# Patient Record
Sex: Male | Born: 1940 | Race: White | Hispanic: No | Marital: Married | Smoking: Former smoker
Health system: Southern US, Community
[De-identification: ages and names within clinical notes are randomized; demographics above are authoritative.]

## PROBLEM LIST (undated history)

## (undated) DIAGNOSIS — I679 Cerebrovascular disease, unspecified: Secondary | ICD-10-CM

## (undated) DIAGNOSIS — M545 Low back pain, unspecified: Secondary | ICD-10-CM

## (undated) DIAGNOSIS — C801 Malignant (primary) neoplasm, unspecified: Secondary | ICD-10-CM

## (undated) DIAGNOSIS — I10 Essential (primary) hypertension: Secondary | ICD-10-CM

## (undated) DIAGNOSIS — R05 Cough: Secondary | ICD-10-CM

## (undated) DIAGNOSIS — E785 Hyperlipidemia, unspecified: Secondary | ICD-10-CM

## (undated) DIAGNOSIS — J449 Chronic obstructive pulmonary disease, unspecified: Secondary | ICD-10-CM

## (undated) DIAGNOSIS — I639 Cerebral infarction, unspecified: Secondary | ICD-10-CM

## (undated) DIAGNOSIS — R058 Other specified cough: Secondary | ICD-10-CM

## (undated) DIAGNOSIS — R269 Unspecified abnormalities of gait and mobility: Principal | ICD-10-CM

## (undated) HISTORY — PX: OTHER SURGICAL HISTORY: SHX169

## (undated) HISTORY — DX: Low back pain: M54.5

## (undated) HISTORY — DX: Low back pain, unspecified: M54.50

## (undated) HISTORY — DX: Unspecified abnormalities of gait and mobility: R26.9

## (undated) HISTORY — DX: Hyperlipidemia, unspecified: E78.5

## (undated) HISTORY — DX: Cerebrovascular disease, unspecified: I67.9

---

## 2005-11-12 DIAGNOSIS — I639 Cerebral infarction, unspecified: Secondary | ICD-10-CM

## 2005-11-12 HISTORY — DX: Cerebral infarction, unspecified: I63.9

## 2006-07-24 ENCOUNTER — Encounter: Admission: RE | Admit: 2006-07-24 | Discharge: 2006-07-24 | Payer: Self-pay | Admitting: Otolaryngology

## 2006-08-12 ENCOUNTER — Encounter: Admission: RE | Admit: 2006-08-12 | Discharge: 2006-08-12 | Payer: Self-pay | Admitting: Internal Medicine

## 2006-09-11 ENCOUNTER — Ambulatory Visit (HOSPITAL_COMMUNITY): Admission: RE | Admit: 2006-09-11 | Discharge: 2006-09-11 | Payer: Self-pay | Admitting: Internal Medicine

## 2006-10-21 ENCOUNTER — Inpatient Hospital Stay (HOSPITAL_COMMUNITY): Admission: RE | Admit: 2006-10-21 | Discharge: 2006-10-23 | Payer: Self-pay | Admitting: *Deleted

## 2006-10-21 ENCOUNTER — Encounter (INDEPENDENT_AMBULATORY_CARE_PROVIDER_SITE_OTHER): Payer: Self-pay | Admitting: Specialist

## 2007-04-24 ENCOUNTER — Ambulatory Visit: Payer: Self-pay | Admitting: *Deleted

## 2007-08-15 ENCOUNTER — Emergency Department (HOSPITAL_COMMUNITY): Admission: EM | Admit: 2007-08-15 | Discharge: 2007-08-15 | Payer: Self-pay | Admitting: Emergency Medicine

## 2007-11-13 HISTORY — PX: CAROTID ARTERY ANGIOPLASTY: SHX1300

## 2007-11-20 ENCOUNTER — Ambulatory Visit: Payer: Self-pay | Admitting: *Deleted

## 2008-11-12 HISTORY — PX: BACK SURGERY: SHX140

## 2008-11-25 ENCOUNTER — Ambulatory Visit: Payer: Self-pay | Admitting: *Deleted

## 2009-05-19 ENCOUNTER — Inpatient Hospital Stay (HOSPITAL_COMMUNITY): Admission: RE | Admit: 2009-05-19 | Discharge: 2009-05-21 | Payer: Self-pay | Admitting: Orthopedic Surgery

## 2009-05-23 ENCOUNTER — Ambulatory Visit: Payer: Self-pay | Admitting: *Deleted

## 2009-05-23 ENCOUNTER — Ambulatory Visit: Admission: RE | Admit: 2009-05-23 | Discharge: 2009-05-23 | Payer: Self-pay | Admitting: Orthopedic Surgery

## 2009-05-23 ENCOUNTER — Encounter (INDEPENDENT_AMBULATORY_CARE_PROVIDER_SITE_OTHER): Payer: Self-pay | Admitting: Orthopedic Surgery

## 2009-10-10 ENCOUNTER — Ambulatory Visit (HOSPITAL_BASED_OUTPATIENT_CLINIC_OR_DEPARTMENT_OTHER): Admission: RE | Admit: 2009-10-10 | Discharge: 2009-10-11 | Payer: Self-pay | Admitting: Urology

## 2009-10-14 ENCOUNTER — Encounter (INDEPENDENT_AMBULATORY_CARE_PROVIDER_SITE_OTHER): Payer: Self-pay | Admitting: *Deleted

## 2009-10-14 ENCOUNTER — Ambulatory Visit: Payer: Self-pay | Admitting: Cardiology

## 2009-10-14 ENCOUNTER — Inpatient Hospital Stay (HOSPITAL_COMMUNITY): Admission: EM | Admit: 2009-10-14 | Discharge: 2009-10-18 | Payer: Self-pay | Admitting: Emergency Medicine

## 2009-10-15 ENCOUNTER — Ambulatory Visit: Payer: Self-pay | Admitting: Gastroenterology

## 2009-10-15 ENCOUNTER — Encounter (INDEPENDENT_AMBULATORY_CARE_PROVIDER_SITE_OTHER): Payer: Self-pay | Admitting: *Deleted

## 2009-10-17 ENCOUNTER — Encounter (INDEPENDENT_AMBULATORY_CARE_PROVIDER_SITE_OTHER): Payer: Self-pay | Admitting: Internal Medicine

## 2009-10-18 ENCOUNTER — Encounter (INDEPENDENT_AMBULATORY_CARE_PROVIDER_SITE_OTHER): Payer: Self-pay | Admitting: *Deleted

## 2009-12-09 ENCOUNTER — Ambulatory Visit (HOSPITAL_BASED_OUTPATIENT_CLINIC_OR_DEPARTMENT_OTHER): Admission: RE | Admit: 2009-12-09 | Discharge: 2009-12-09 | Payer: Self-pay | Admitting: Urology

## 2009-12-21 ENCOUNTER — Ambulatory Visit
Admission: RE | Admit: 2009-12-21 | Discharge: 2010-03-07 | Payer: Self-pay | Source: Home / Self Care | Admitting: Radiation Oncology

## 2009-12-23 ENCOUNTER — Ambulatory Visit: Payer: Self-pay | Admitting: Oncology

## 2009-12-23 LAB — COMPREHENSIVE METABOLIC PANEL
ALT: 19 U/L (ref 0–53)
Alkaline Phosphatase: 98 U/L (ref 39–117)
CO2: 24 mEq/L (ref 19–32)
Creatinine, Ser: 0.75 mg/dL (ref 0.40–1.50)
Total Bilirubin: 0.3 mg/dL (ref 0.3–1.2)

## 2009-12-23 LAB — CBC WITH DIFFERENTIAL/PLATELET
BASO%: 0.4 % (ref 0.0–2.0)
HCT: 34.2 % — ABNORMAL LOW (ref 38.4–49.9)
LYMPH%: 15.4 % (ref 14.0–49.0)
MCH: 32.4 pg (ref 27.2–33.4)
MCHC: 34.6 g/dL (ref 32.0–36.0)
MCV: 93.6 fL (ref 79.3–98.0)
MONO#: 0.4 10*3/uL (ref 0.1–0.9)
MONO%: 8.2 % (ref 0.0–14.0)
NEUT%: 70.8 % (ref 39.0–75.0)
Platelets: 156 10*3/uL (ref 140–400)
WBC: 5.4 10*3/uL (ref 4.0–10.3)

## 2009-12-23 LAB — LACTATE DEHYDROGENASE: LDH: 136 U/L (ref 94–250)

## 2010-01-02 ENCOUNTER — Emergency Department (HOSPITAL_COMMUNITY): Admission: EM | Admit: 2010-01-02 | Discharge: 2010-01-02 | Payer: Self-pay | Admitting: Emergency Medicine

## 2010-01-06 ENCOUNTER — Ambulatory Visit (HOSPITAL_COMMUNITY): Admission: RE | Admit: 2010-01-06 | Discharge: 2010-01-06 | Payer: Self-pay | Admitting: Radiation Oncology

## 2010-01-17 LAB — CBC WITH DIFFERENTIAL/PLATELET
BASO%: 0.9 % (ref 0.0–2.0)
Basophils Absolute: 0 10*3/uL (ref 0.0–0.1)
HCT: 37.2 % — ABNORMAL LOW (ref 38.4–49.9)
HGB: 13.1 g/dL (ref 13.0–17.1)
MONO#: 0.6 10*3/uL (ref 0.1–0.9)
NEUT%: 62.4 % (ref 39.0–75.0)
RDW: 13.8 % (ref 11.0–14.6)
WBC: 4.7 10*3/uL (ref 4.0–10.3)
lymph#: 1 10*3/uL (ref 0.9–3.3)

## 2010-01-17 LAB — COMPREHENSIVE METABOLIC PANEL
ALT: 19 U/L (ref 0–53)
Albumin: 4.2 g/dL (ref 3.5–5.2)
BUN: 11 mg/dL (ref 6–23)
CO2: 23 mEq/L (ref 19–32)
Calcium: 9.4 mg/dL (ref 8.4–10.5)
Chloride: 97 mEq/L (ref 96–112)
Creatinine, Ser: 0.57 mg/dL (ref 0.40–1.50)
Potassium: 4.1 mEq/L (ref 3.5–5.3)

## 2010-01-24 ENCOUNTER — Ambulatory Visit: Payer: Self-pay | Admitting: Oncology

## 2010-01-24 LAB — CBC WITH DIFFERENTIAL/PLATELET
BASO%: 0.4 % (ref 0.0–2.0)
EOS%: 3.2 % (ref 0.0–7.0)
HCT: 37.5 % — ABNORMAL LOW (ref 38.4–49.9)
LYMPH%: 18.4 % (ref 14.0–49.0)
MCH: 32 pg (ref 27.2–33.4)
MCHC: 34.9 g/dL (ref 32.0–36.0)
MCV: 91.5 fL (ref 79.3–98.0)
MONO%: 12.5 % (ref 0.0–14.0)
NEUT%: 65.5 % (ref 39.0–75.0)
Platelets: 201 10*3/uL (ref 140–400)
RBC: 4.1 10*6/uL — ABNORMAL LOW (ref 4.20–5.82)
WBC: 6.9 10*3/uL (ref 4.0–10.3)

## 2010-01-24 LAB — COMPREHENSIVE METABOLIC PANEL
ALT: 24 U/L (ref 0–53)
AST: 27 U/L (ref 0–37)
Alkaline Phosphatase: 101 U/L (ref 39–117)
CO2: 26 mEq/L (ref 19–32)
Creatinine, Ser: 0.65 mg/dL (ref 0.40–1.50)
Sodium: 135 mEq/L (ref 135–145)
Total Bilirubin: 0.5 mg/dL (ref 0.3–1.2)
Total Protein: 7.1 g/dL (ref 6.0–8.3)

## 2010-01-31 LAB — CBC WITH DIFFERENTIAL/PLATELET
BASO%: 0.4 % (ref 0.0–2.0)
Eosinophils Absolute: 0.1 10*3/uL (ref 0.0–0.5)
HCT: 36.6 % — ABNORMAL LOW (ref 38.4–49.9)
LYMPH%: 18.3 % (ref 14.0–49.0)
MCHC: 35.2 g/dL (ref 32.0–36.0)
MCV: 91.3 fL (ref 79.3–98.0)
MONO#: 0.6 10*3/uL (ref 0.1–0.9)
MONO%: 12.2 % (ref 0.0–14.0)
NEUT%: 66.3 % (ref 39.0–75.0)
Platelets: 174 10*3/uL (ref 140–400)
RBC: 4.01 10*6/uL — ABNORMAL LOW (ref 4.20–5.82)
WBC: 4.6 10*3/uL (ref 4.0–10.3)

## 2010-01-31 LAB — COMPREHENSIVE METABOLIC PANEL
ALT: 25 U/L (ref 0–53)
Alkaline Phosphatase: 89 U/L (ref 39–117)
Creatinine, Ser: 0.6 mg/dL (ref 0.40–1.50)
Sodium: 132 mEq/L — ABNORMAL LOW (ref 135–145)
Total Bilirubin: 0.6 mg/dL (ref 0.3–1.2)
Total Protein: 6.7 g/dL (ref 6.0–8.3)

## 2010-02-07 LAB — COMPREHENSIVE METABOLIC PANEL
ALT: 24 U/L (ref 0–53)
AST: 27 U/L (ref 0–37)
Alkaline Phosphatase: 75 U/L (ref 39–117)
Creatinine, Ser: 0.77 mg/dL (ref 0.40–1.50)
Sodium: 135 mEq/L (ref 135–145)
Total Bilirubin: 0.5 mg/dL (ref 0.3–1.2)
Total Protein: 6.1 g/dL (ref 6.0–8.3)

## 2010-02-07 LAB — CBC WITH DIFFERENTIAL/PLATELET
BASO%: 0.7 % (ref 0.0–2.0)
EOS%: 3.5 % (ref 0.0–7.0)
HCT: 36.1 % — ABNORMAL LOW (ref 38.4–49.9)
LYMPH%: 15.8 % (ref 14.0–49.0)
MCH: 31.7 pg (ref 27.2–33.4)
MCHC: 34.9 g/dL (ref 32.0–36.0)
NEUT%: 70.7 % (ref 39.0–75.0)
Platelets: 143 10*3/uL (ref 140–400)
RBC: 3.97 10*6/uL — ABNORMAL LOW (ref 4.20–5.82)
WBC: 4.6 10*3/uL (ref 4.0–10.3)

## 2010-02-14 LAB — COMPREHENSIVE METABOLIC PANEL
Albumin: 4.3 g/dL (ref 3.5–5.2)
Alkaline Phosphatase: 88 U/L (ref 39–117)
BUN: 9 mg/dL (ref 6–23)
CO2: 28 mEq/L (ref 19–32)
Calcium: 9.6 mg/dL (ref 8.4–10.5)
Glucose, Bld: 105 mg/dL — ABNORMAL HIGH (ref 70–99)
Potassium: 4 mEq/L (ref 3.5–5.3)
Sodium: 132 mEq/L — ABNORMAL LOW (ref 135–145)
Total Protein: 7.1 g/dL (ref 6.0–8.3)

## 2010-02-14 LAB — CBC WITH DIFFERENTIAL/PLATELET
Basophils Absolute: 0 10*3/uL (ref 0.0–0.1)
EOS%: 3.2 % (ref 0.0–7.0)
Eosinophils Absolute: 0.2 10*3/uL (ref 0.0–0.5)
HCT: 34.8 % — ABNORMAL LOW (ref 38.4–49.9)
HGB: 12.4 g/dL — ABNORMAL LOW (ref 13.0–17.1)
MCH: 31.9 pg (ref 27.2–33.4)
MCV: 89.5 fL (ref 79.3–98.0)
NEUT#: 4.5 10*3/uL (ref 1.5–6.5)
NEUT%: 78.3 % — ABNORMAL HIGH (ref 39.0–75.0)
lymph#: 0.6 10*3/uL — ABNORMAL LOW (ref 0.9–3.3)

## 2010-02-21 LAB — CBC WITH DIFFERENTIAL/PLATELET
BASO%: 0.9 % (ref 0.0–2.0)
Basophils Absolute: 0 10*3/uL (ref 0.0–0.1)
EOS%: 6.4 % (ref 0.0–7.0)
HGB: 11.9 g/dL — ABNORMAL LOW (ref 13.0–17.1)
MCH: 32.8 pg (ref 27.2–33.4)
MCHC: 36.5 g/dL — ABNORMAL HIGH (ref 32.0–36.0)
MCV: 89.8 fL (ref 79.3–98.0)
MONO%: 8.5 % (ref 0.0–14.0)
RDW: 13.9 % (ref 11.0–14.6)
lymph#: 0.5 10*3/uL — ABNORMAL LOW (ref 0.9–3.3)

## 2010-02-21 LAB — COMPREHENSIVE METABOLIC PANEL
ALT: 26 U/L (ref 0–53)
AST: 28 U/L (ref 0–37)
Albumin: 4 g/dL (ref 3.5–5.2)
Alkaline Phosphatase: 82 U/L (ref 39–117)
Calcium: 9 mg/dL (ref 8.4–10.5)
Chloride: 92 mEq/L — ABNORMAL LOW (ref 96–112)
Potassium: 3.5 mEq/L (ref 3.5–5.3)
Sodium: 129 mEq/L — ABNORMAL LOW (ref 135–145)
Total Protein: 6.6 g/dL (ref 6.0–8.3)

## 2010-02-24 ENCOUNTER — Ambulatory Visit: Payer: Self-pay | Admitting: Oncology

## 2010-02-28 LAB — CBC WITH DIFFERENTIAL/PLATELET
Basophils Absolute: 0 10*3/uL (ref 0.0–0.1)
EOS%: 3.7 % (ref 0.0–7.0)
Eosinophils Absolute: 0.1 10*3/uL (ref 0.0–0.5)
HGB: 10.4 g/dL — ABNORMAL LOW (ref 13.0–17.1)
LYMPH%: 12.5 % — ABNORMAL LOW (ref 14.0–49.0)
MCH: 33.4 pg (ref 27.2–33.4)
MCV: 91.3 fL (ref 79.3–98.0)
MONO%: 11.4 % (ref 0.0–14.0)
NEUT#: 2 10*3/uL (ref 1.5–6.5)
Platelets: 180 10*3/uL (ref 140–400)

## 2010-02-28 LAB — COMPREHENSIVE METABOLIC PANEL
AST: 28 U/L (ref 0–37)
Alkaline Phosphatase: 88 U/L (ref 39–117)
BUN: 7 mg/dL (ref 6–23)
Glucose, Bld: 96 mg/dL (ref 70–99)
Potassium: 3.3 mEq/L — ABNORMAL LOW (ref 3.5–5.3)
Sodium: 131 mEq/L — ABNORMAL LOW (ref 135–145)
Total Bilirubin: 1 mg/dL (ref 0.3–1.2)
Total Protein: 6.9 g/dL (ref 6.0–8.3)

## 2010-03-21 ENCOUNTER — Ambulatory Visit (HOSPITAL_COMMUNITY): Admission: RE | Admit: 2010-03-21 | Discharge: 2010-03-21 | Payer: Self-pay | Admitting: Oncology

## 2010-03-29 ENCOUNTER — Ambulatory Visit: Payer: Self-pay | Admitting: Oncology

## 2010-03-30 LAB — COMPREHENSIVE METABOLIC PANEL
AST: 23 U/L (ref 0–37)
Alkaline Phosphatase: 88 U/L (ref 39–117)
BUN: 12 mg/dL (ref 6–23)
Calcium: 8.6 mg/dL (ref 8.4–10.5)
Creatinine, Ser: 0.65 mg/dL (ref 0.40–1.50)
Glucose, Bld: 91 mg/dL (ref 70–99)

## 2010-03-30 LAB — CBC WITH DIFFERENTIAL/PLATELET
Basophils Absolute: 0 10*3/uL (ref 0.0–0.1)
EOS%: 1.4 % (ref 0.0–7.0)
HCT: 30.4 % — ABNORMAL LOW (ref 38.4–49.9)
HGB: 11.1 g/dL — ABNORMAL LOW (ref 13.0–17.1)
MCH: 37.3 pg — ABNORMAL HIGH (ref 27.2–33.4)
MCV: 101.5 fL — ABNORMAL HIGH (ref 79.3–98.0)
MONO%: 18.5 % — ABNORMAL HIGH (ref 0.0–14.0)
NEUT%: 69.3 % (ref 39.0–75.0)
Platelets: 204 10*3/uL (ref 140–400)

## 2010-05-05 ENCOUNTER — Ambulatory Visit (HOSPITAL_BASED_OUTPATIENT_CLINIC_OR_DEPARTMENT_OTHER): Admission: RE | Admit: 2010-05-05 | Discharge: 2010-05-05 | Payer: Self-pay | Admitting: Urology

## 2010-06-28 ENCOUNTER — Ambulatory Visit: Payer: Self-pay | Admitting: Oncology

## 2010-07-18 LAB — CBC WITH DIFFERENTIAL/PLATELET
Basophils Absolute: 0 10*3/uL (ref 0.0–0.1)
EOS%: 0.8 % (ref 0.0–7.0)
HGB: 13.3 g/dL (ref 13.0–17.1)
MCH: 35.3 pg — ABNORMAL HIGH (ref 27.2–33.4)
MCV: 100 fL — ABNORMAL HIGH (ref 79.3–98.0)
MONO%: 11 % (ref 0.0–14.0)
RDW: 12.9 % (ref 11.0–14.6)

## 2010-07-18 LAB — COMPREHENSIVE METABOLIC PANEL
AST: 21 U/L (ref 0–37)
Alkaline Phosphatase: 94 U/L (ref 39–117)
BUN: 8 mg/dL (ref 6–23)
Creatinine, Ser: 0.71 mg/dL (ref 0.40–1.50)
Potassium: 3.5 mEq/L (ref 3.5–5.3)
Total Bilirubin: 0.8 mg/dL (ref 0.3–1.2)

## 2010-10-12 ENCOUNTER — Ambulatory Visit: Payer: Self-pay | Admitting: Oncology

## 2010-12-02 ENCOUNTER — Encounter: Payer: Self-pay | Admitting: Internal Medicine

## 2010-12-03 ENCOUNTER — Encounter: Payer: Self-pay | Admitting: Oncology

## 2010-12-03 ENCOUNTER — Encounter: Payer: Self-pay | Admitting: Otolaryngology

## 2010-12-12 NOTE — Discharge Summary (Signed)
Summary: Hospital Discharge   NAME:  Thomas Ferrell, Thomas Ferrell NO.:  [id number]      MEDICAL RECORD NO.:  [id number]          PATIENT TYPE:  INP      LOCATION:  1424                         FACILITY:  [facility]      PHYSICIAN:  Marcellus Scott, MD     DATE OF BIRTH:  01/13/1941      DATE OF ADMISSION:  10/13/2009   DATE OF DISCHARGE:  10/18/2009                                  DISCHARGE SUMMARY      PRIMARY MEDICAL DOCTOR:  Dr. Lajean Manes.      UROLOGIST:  Ihor Gully, M.D.      GASTROENTEROLOGIST:  Dr. Rob Bunting      DISCHARGE DIAGNOSIS:   1. Postoperative ileus, resolved.   2. Aspiration pneumonia, improving/ stable.   3. Anemia, stable.   4. Acute diastolic congestive heart failure secondary to IV fluids.       Echo with normal left ventricular ejection fraction.   5. Hyponatremia secondary to hydrochlorothiazide.  HCTZ discontinued.   6. Hypertension.   7. Mild upper GI bleed.  Resolved.  Outpatient GI follow-up.   8. Recent transurethral resection of bladder tumor (TURBT) with       minimal intermittent hematuria, outpatient follow-up with Urology.      DISCHARGE MEDICATIONS:  Furosemide 20 mg p.o. daily.   Lisinopril 20 mg p.o. daily.   Moxifloxacin 400 mg p.o. daily for an additional 3 days to complete a   total week's course.   Omeprazole 40 mg p.o. daily.   Hydrocodone / acetaminophen 10/660 mg,, one p.o. q. 4 hourly p.r.n. for   pain.   Phenergan 25 mg p.o. q. 6 hourly p.r.n. for nausea.   Simvastatin 20 mg p.o. daily.      DISCONTINUED MEDICATIONS:   1. Lisinopril /hydrochlorothiazide.   2. Aspirin.      PROCEDURES:   1. Chest x-ray December 6,  impression:  Interval decrease in       conspicuity of the peripheral air space disease seen on the       previous   Study.   1. Chest x-ray on December 5, impression:  New bilateral air space       opacity suspicious for pneumonia.  Radiographic follow-up was       recommended.   2. Abdominal x-ray  on December 4, impression:  Nonspecific bowel gas       pattern.  No gaseous distention of bowel was seen.   3. CT of the abdomen with contrast on December 3, impression:       a.     Diffuse mild distention of small bowel loops to 3.2 cm in        maximal diameter without evidence of a transition point.  There is        gradual decrease in the diameter of the small bowel with air and        stool seen throughout much of the colon.  Findings are most        compatible with small bowel dysmotility  and ileus.       b.     Mild focal opacity at the inferior aspect of the left upper        lobe likely reflects pneumonia.       c.     Trace bilateral pleural effusions, right greater than left.       d.     Trace fluid noted about the inferior tip of the liver.       e.     Multiple small bilateral renal cysts.       f.     Dense calcification involving both common iliac arteries.        Calcification along the abdominal aorta and its branches.   4. CT of the pelvis with contrast, impression:       a.     Significantly thick-walled appearance to the bladder, with        central increased attenuation suggestive of blood.  No defined        mass seen.  The thick-walled appearance suggests either chronic        inflammation or diffuse wall hyperplasia.       b.     Postoperative changes noted at the lower lumbar spine with        associated minimal grade 1 retrolisthesis of L4-5.   5. Acute abdominal series including abdominal x-ray and chest x-ray,       impression:       a.     Multiple distended mid abdominal small-bowel loops with        fluid levels suggesting early or partial small-bowel obstruction.       b.     Mild left mid and upper lung alveolar infiltrates and a        focal right upper lobe air space opacity suggesting pneumonia.      PERTINENT LABS:  Basic metabolic panel 384.  Basic metabolic panel   today:  Sodium 132, potassium 3.6, chloride 96, bicarb 29, glucose 123,   BUN 3,   creatinine 0.78, calcium 8.2.  CBC:  Hemoglobin 9.8, hematocrit   29.3, white blood cell 5.9, platelets 178.  Anemia panel with absolute   reticulocytes 38, iron 12, total iron-binding capacity 185, vitamin B12   540, serum folate greater than 20, ferritin 222.  Urine culture was   negative.  TSH 0.487.  Troponin on admission was negative.  CK was 153,   CK-MB 5.6.  Fecal occult blood testing was negative.  Gastric occult   blood testing was positive.  Lipase less than 10.  Hepatic panel was   negative on December 2.  Admitting sodium of 125.      CONSULTATIONS:   1. [facility], Dr. Rob Bunting.      HOSPITAL COURSE AND PATIENT DISPOSITION:  Thomas Ferrell is a pleasant 70-   year-old Caucasian male patient with history of bladder cancer, high-   grade urothelial carcinoma with focal  invasion of the stoma and smooth   muscle (complete) who had transurethral  resection of bladder tumor on   October 10, 2009.  He also has history of hypertension, hyperlipidemia   and previous CVA and tobacco abuse.  He presented with nausea and   vomiting since December 2.  The emesis contained a  brown substance.   The imaging suggested small bowel ileus and pneumonia.  He was thereby   admitted to the hospital for further evaluation and management.  Problem #1.  Postoperative ileus.  The patient was admitted to the   medical floor.  NG tube was passed and placed to intermittent wall   suction.  He was placed on IV PPI drip.  GI was consulted.  They thought   that this was most likely secondary to postoperative ileus.  They added   Reglan.  With these measures  the patient had multiple BMs and his   abdominal distention improved and his ileus on x-rays, resolved.  His NG   was clamped followed by advancement of the diet which he has tolerated.   His Reglan has been discontinued.  Currently he is asymptomatic,   tolerating diet and having BMs.   Problem #2.  Aspiration pneumonia.  This was probably  secondary to his   vomiting episodes.  The patient will complete a week's course of   antibiotics.  Recommend repeating his chest x-ray in a couple of weeks   to ensure resolution.   Problem #3.  Anemia.  His hemoglobins are probably mid 10 gm/dL range.   There was a mild drop but have remained stable.  This is possibly an   anemia of chronic disease versus acute blood loss anemia from his recent   surgery.  Recommend follow-up his CBCs as an outpatient periodically and   consider workup as deemed necessary.   Problem #4.  Acute diastolic congestive heart failure.  The patient 2   days ago complained of some dyspnea and worsening cough.  He was   afebrile without leukocytosis.  Clinically and chest x-ray was   suggestive of pulmonary edema.  The patient was given aliquots of Lasix   with improvement in his symptoms and signs.  His echo shows normal EF.   Will substitute hydrochlorothiazide for Lasix.  Outpatient follow-up as   deemed necessary.   Problem #5.  Hyponatremia which was severe on admission secondary to his   vomiting as well as hydrochlorothiazide.  This was obviously held and so   was his ACE inhibitors in the hospital.  He will be discharged on Lasix   substituting hydrochlorothiazide until he sees his primary MD.  Then   consider continuing the same or changing back to hydrochlorothiazide.   Problem #6.  Hypertension, fair control.   Problem #7.  Mild upper GI bleed where his gastric occult blood was   positive.  However, the drainage in the NG tube was all bilious..  GI   continued to follow him but decided not to perform any procedures.  He   is to follow up with Dr. Christella Hartigan in 3-4 weeks and consider evaluation as   deemed necessary.  His aspirin is held for this reason.  He will be on   proton pump inhibitors.   Problem #8.  Bladder cancer status post transurethral resection of the   bladder tumor.  The patient has intermittent minimal hematuria which is   decreasing.   He is to call his primary urologist for a follow-up   appointment.      At this point the patient is stable with no complaints, stable vital   signs and physical examination and will be discharged home.  He is to   call his primary MD's office to follow-up in approximately a week's   time.  He is to follow-up with his urologist and the gastroenterologist.      Time taken in coordinating this discharge is 35 minutes.  Marcellus Scott, MD            AH/MEDQ  D:  10/18/2009  T:  10/18/2009  Job:  [id number]

## 2011-01-28 LAB — POCT I-STAT 4, (NA,K, GLUC, HGB,HCT)
Glucose, Bld: 98 mg/dL (ref 70–99)
HCT: 37 % — ABNORMAL LOW (ref 39.0–52.0)
Hemoglobin: 12.6 g/dL — ABNORMAL LOW (ref 13.0–17.0)
Potassium: 4.3 mEq/L (ref 3.5–5.1)
Sodium: 134 mEq/L — ABNORMAL LOW (ref 135–145)

## 2011-01-29 LAB — POCT I-STAT 4, (NA,K, GLUC, HGB,HCT)
Glucose, Bld: 101 mg/dL — ABNORMAL HIGH (ref 70–99)
HCT: 41 % (ref 39.0–52.0)
Hemoglobin: 13.9 g/dL (ref 13.0–17.0)
Potassium: 4 mEq/L (ref 3.5–5.1)
Sodium: 129 mEq/L — ABNORMAL LOW (ref 135–145)

## 2011-01-30 ENCOUNTER — Inpatient Hospital Stay (HOSPITAL_COMMUNITY): Payer: Medicare Other

## 2011-01-30 ENCOUNTER — Encounter (HOSPITAL_COMMUNITY): Payer: Self-pay | Admitting: Radiology

## 2011-01-30 ENCOUNTER — Other Ambulatory Visit (HOSPITAL_BASED_OUTPATIENT_CLINIC_OR_DEPARTMENT_OTHER): Payer: Medicare Other

## 2011-01-30 ENCOUNTER — Inpatient Hospital Stay (HOSPITAL_COMMUNITY)
Admission: EM | Admit: 2011-01-30 | Discharge: 2011-01-31 | DRG: 125 | Disposition: A | Discharge status: Home / Self Care | Payer: Medicare Other | Attending: Internal Medicine | Admitting: Internal Medicine

## 2011-01-30 ENCOUNTER — Emergency Department (HOSPITAL_COMMUNITY): Payer: Medicare Other

## 2011-01-30 DIAGNOSIS — I6529 Occlusion and stenosis of unspecified carotid artery: Secondary | ICD-10-CM | POA: Diagnosis present

## 2011-01-30 DIAGNOSIS — H5319 Other subjective visual disturbances: Principal | ICD-10-CM | POA: Diagnosis present

## 2011-01-30 DIAGNOSIS — Z8551 Personal history of malignant neoplasm of bladder: Secondary | ICD-10-CM

## 2011-01-30 DIAGNOSIS — F17200 Nicotine dependence, unspecified, uncomplicated: Secondary | ICD-10-CM | POA: Diagnosis present

## 2011-01-30 DIAGNOSIS — I10 Essential (primary) hypertension: Secondary | ICD-10-CM | POA: Diagnosis present

## 2011-01-30 DIAGNOSIS — E785 Hyperlipidemia, unspecified: Secondary | ICD-10-CM | POA: Diagnosis present

## 2011-01-30 DIAGNOSIS — Z8673 Personal history of transient ischemic attack (TIA), and cerebral infarction without residual deficits: Secondary | ICD-10-CM

## 2011-01-30 HISTORY — DX: Cerebral infarction, unspecified: I63.9

## 2011-01-30 HISTORY — DX: Essential (primary) hypertension: I10

## 2011-01-30 HISTORY — DX: Malignant (primary) neoplasm, unspecified: C80.1

## 2011-01-30 LAB — APTT: aPTT: 30 seconds (ref 24–37)

## 2011-01-30 LAB — PROTIME-INR
INR: 1.04 (ref 0.00–1.49)
Prothrombin Time: 13.8 seconds (ref 11.6–15.2)

## 2011-01-30 LAB — URINALYSIS, ROUTINE W REFLEX MICROSCOPIC
Bilirubin Urine: NEGATIVE
Glucose, UA: NEGATIVE mg/dL
Hgb urine dipstick: NEGATIVE
Specific Gravity, Urine: 1.011 (ref 1.005–1.030)
Urobilinogen, UA: 0.2 mg/dL (ref 0.0–1.0)
pH: 6 (ref 5.0–8.0)

## 2011-01-30 LAB — CBC
Hemoglobin: 13.8 g/dL (ref 13.0–17.0)
MCH: 33 pg (ref 26.0–34.0)
MCV: 97.1 fL (ref 78.0–100.0)
RBC: 4.18 MIL/uL — ABNORMAL LOW (ref 4.22–5.81)
WBC: 7 10*3/uL (ref 4.0–10.5)

## 2011-01-30 LAB — GLUCOSE, CAPILLARY: Glucose-Capillary: 130 mg/dL — ABNORMAL HIGH (ref 70–99)

## 2011-01-30 LAB — CK TOTAL AND CKMB (NOT AT ARMC): Relative Index: 1.4 (ref 0.0–2.5)

## 2011-01-30 LAB — BASIC METABOLIC PANEL
CO2: 29 mEq/L (ref 19–32)
Chloride: 104 mEq/L (ref 96–112)
Creatinine, Ser: 0.83 mg/dL (ref 0.4–1.5)
GFR calc Af Amer: >60 mL/min (ref >60)
Potassium: 4.3 mEq/L (ref 3.5–5.1)

## 2011-01-30 LAB — HEMOGLOBIN A1C
Hgb A1c MFr Bld: 5.6 % (ref <5.7)
Mean Plasma Glucose: 114 mg/dL (ref <117)

## 2011-01-30 MED ORDER — GADOBENATE DIMEGLUMINE 529 MG/ML IV SOLN
15.0000 mL | Freq: Once | INTRAVENOUS | Status: AC | PRN
  Administered 2011-01-30: 15 mL via INTRAVENOUS

## 2011-01-31 DIAGNOSIS — I6789 Other cerebrovascular disease: Secondary | ICD-10-CM

## 2011-01-31 LAB — COMPREHENSIVE METABOLIC PANEL
ALT: 13 U/L (ref 0–53)
AST: 23 U/L (ref 0–37)
Albumin: 3.6 g/dL (ref 3.5–5.2)
CO2: 24 mEq/L (ref 19–32)
Chloride: 106 mEq/L (ref 96–112)
Creatinine, Ser: 0.69 mg/dL (ref 0.4–1.5)
GFR calc Af Amer: >60 mL/min (ref >60)
GFR calc non Af Amer: >60 mL/min (ref >60)
Sodium: 138 mEq/L (ref 135–145)
Total Bilirubin: 0.6 mg/dL (ref 0.3–1.2)

## 2011-01-31 LAB — LIPID PANEL
Cholesterol: 114 mg/dL (ref 0–200)
HDL: 35 mg/dL — ABNORMAL LOW (ref >39)
Total CHOL/HDL Ratio: 3.3 RATIO
Triglycerides: 107 mg/dL (ref <150)

## 2011-01-31 LAB — CBC
Hemoglobin: 13.1 g/dL (ref 13.0–17.0)
Platelets: 166 10*3/uL (ref 150–400)
RBC: 3.95 MIL/uL — ABNORMAL LOW (ref 4.22–5.81)
WBC: 5.4 10*3/uL (ref 4.0–10.5)

## 2011-01-31 LAB — GLUCOSE, CAPILLARY

## 2011-01-31 NOTE — Discharge Summary (Signed)
NAME:  Thomas, Ferrell NO.:  [id number]  MEDICAL RECORD NO.:  [id number]           PATIENT TYPE:  I  LOCATION:  1423                         FACILITY:  [facility]  PHYSICIAN:  Marcellus Scott, MD     DATE OF BIRTH:  1941-03-17  DATE OF ADMISSION:  01/30/2011 DATE OF DISCHARGE:  01/31/2011                              DISCHARGE SUMMARY   PRIMARY CARE PHYSICIAN:  Joycelyn Rua, M.D., of [facility]  DISCHARGE DIAGNOSES: 1. Transient visual symptoms, unclear etiology, resolved. 2. Tobacco abuse. 3. Hypertension. 4. Hyperlipidemia. 5. Single 2.38-second asymptomatic pause on telemetry, for outpatient     evaluation. 6. History of bladder cancer. 7. History of internal carotid artery stenosis status post bilateral     carotid endarterectomy.  DISCHARGE MEDICATIONS: 1. Aspirin 81 mg p.o. daily. 2. Lisinopril 20 mg p.o. daily. 3. Simvastatin 20 mg p.o. daily.  IMAGING: 1. MRI of the head without and with contrast:  Impression, no acute or     subacute infarction, chronic small vessel disease affecting the     basal ganglion hemispheric deep white matter slightly progressive     since 2007.  Sinus inflammation most notable on the left maxillary     sinus. 2. MRA of the head, normal intracranial MR angiography of the large     and medium sized vessels. 3. MRA of the neck:  Impression, previous carotid endarterectomy on     the right.  Wide patency.  No stenosis.  There may have been     previous carotid endarterectomy on the left as well, though the     radiologist was less sure about that.  Smooth plaque along the     posterior wall of the proximal left ICA but no stenosis.  Severe     disease of both vertebral artery origins.  Stenosis 70% or greater     in both locations.  Severely diseased proximal one half of the left     vertebral artery which is narrowed, irregular, and stenotic.  This     could be due to atherosclerotic disease or fibromuscular  disease. 4. CT of the head without contrast:  Impression, no definite acute     intracranial abnormalities identified.  Multiple remote appearing     lacunar type infarctions.  Each of these is unchanged compared to     the prior examination, except for one in the left subinsular     cortex.  Mild left ethmoid sinus disease. 5. 2D echocardiogram:  Left ventricle cavity size was normal.  Wall     thickness was increased in the pattern of mild left ventricular     hypertrophy.  Estimated ejection fraction was 60%.  Wall motion was     normal.  There were no regional wall motion abnormalities.     Pulmonary artery peak pressure was 34 mmHg.  No cardiac source of     embolus.  LABORATORY DATA:  Lipid panel only significant for HDL 35, LDL was 58. Comprehensive metabolic panel within normal limits.  BUN 8, creatinine 0.69, and potassium 3.6.  CBC within normal limits.  Hemoglobin 13.1, hematocrit 38.4, white blood cell 5.4, platelets 166, hemoglobin A1c 5.6.  Urinalysis negative for features of urinary tract infection. Cardiac enzymes x1 were negative for acute coronary syndrome. Coagulation indices were within normal limits.  CONSULTATIONS:  None.  DIET:  Heart healthy diet.  ACTIVITY:  Ad lib.  Complaints today none.  The patient indicates that while he was at his primary care physician's office he had a transient 10-minute period of seeing halos and rainbow like objects around his eyes with no other symptoms except for some nasal congestion and allergic symptoms.  These symptoms resolved spontaneously.  PHYSICAL EXAMINATION:  GENERAL:  Thomas Ferrell is in no obvious distress. VITAL SIGNS:  Telemetry shows mostly sinus bradycardia in the 50s to sinus rhythm in the 60s but does have occasional sinus bradycardia in the 40s.  He had a 2.38 seconds pause at 8:31 this morning which was asymptomatic.  Temperature 98.1 degrees Fahrenheit, pulse 65 per minute and regular, respirations 16 per  minute, blood pressure 138/77 mmHg and saturating at 95% on room air. RESPIRATORY:  Clear. CARDIOVASCULAR:  First and second heart sounds heard regular.  No murmurs or JVD. ABDOMEN:  Nondistended, nontender, soft and bowel sounds present. CENTRAL NERVOUS SYSTEM:  Patient is awake, alert, oriented x3 with no focal neurological deficits. EXTREMITIES:  With grade 5/5 power.  HOSPITAL COURSE:  Thomas Ferrell is a 70 year old Caucasian male patient with history of previous CVA, hypertension, hyperlipidemia, carotid artery stenosis status post right carotid endarterectomy, who has no residual deficits from his stroke.  While at the physician's office, he reported seeing halos and rainbows around objects and these symptoms were apparently similar to the symptoms when he had a CVA.  His primary care physician thereby sent him to the hospital for further evaluation and management. 1. Transient visual changes, unclear etiology, but resolved.  Extensive     stroke workup did not reveal a stroke.  These symptoms may have     been related to his allergies. The MRI of the brain shows some sinusitis.     Unclear if he has been treated for this as an outpatient but if not that      may be one consideration by his primary care physician.  The patient          indicates that  he is back to baseline with no new complaints or deficits. 2. Hypertension.  Fairly controlled on current medications. 3. Hyperlipidemia.  Continue statins. 4. History of tobacco abuse.  Cessation counseling has been done. 5. History of CVA with no residual deficits. 6. A 2.38-second pause on telemetry which was asymptomatic.  The     patient is asymptomatic of any dizziness, lightheadedness, or     passing out episodes.  He denies any chest pain or dyspnea.  I     discussed his case with the [facility] cardiologists who recommended     a 48-hour Holter monitor and they will arrange it through their     offices but have cleared him for  discharge.  DISPOSITION:  The patient is discharged home in stable condition.  FOLLOWUP RECOMMENDATIONS: 1. With Dr. Joycelyn Rua.  The patient is to call for an appointment     to be seen in 5-7 days. 2. The [facility] offices will arrange for a 48-hour     Holter monitoring.  Time taken in coordinating this discharge is 35 minutes.     Marcellus Scott, MD     AH/MEDQ  D:  01/31/2011  T:  01/31/2011  Job:  [id number]  cc:   Joycelyn Rua, M.D. Fax: [phone]  Electronically Signed by Marcellus Scott MD on 01/31/2011 10:33:15 PM

## 2011-02-01 LAB — URINE CULTURE

## 2011-02-01 LAB — URINALYSIS, ROUTINE W REFLEX MICROSCOPIC
Nitrite: POSITIVE — AB
Specific Gravity, Urine: 1.017 (ref 1.005–1.030)
pH: 6 (ref 5.0–8.0)

## 2011-02-01 LAB — URINE MICROSCOPIC-ADD ON

## 2011-02-13 LAB — URINE CULTURE
Colony Count: NO GROWTH
Culture: NO GROWTH

## 2011-02-13 LAB — COMPREHENSIVE METABOLIC PANEL
ALT: 21 U/L (ref 0–53)
Albumin: 3.6 g/dL (ref 3.5–5.2)
Alkaline Phosphatase: 75 U/L (ref 39–117)
BUN: 15 mg/dL (ref 6–23)
Chloride: 85 mEq/L — ABNORMAL LOW (ref 96–112)
Glucose, Bld: 134 mg/dL — ABNORMAL HIGH (ref 70–99)
Potassium: 3.8 mEq/L (ref 3.5–5.1)
Sodium: 125 mEq/L — ABNORMAL LOW (ref 135–145)
Total Bilirubin: 1.1 mg/dL (ref 0.3–1.2)

## 2011-02-13 LAB — DIFFERENTIAL
Basophils Absolute: 0 10*3/uL (ref 0.0–0.1)
Basophils Relative: 0 % (ref 0–1)
Basophils Relative: 1 % (ref 0–1)
Eosinophils Absolute: 0 10*3/uL (ref 0.0–0.7)
Eosinophils Relative: 0 % (ref 0–5)
Lymphocytes Relative: 12 % (ref 12–46)
Lymphocytes Relative: 8 % — ABNORMAL LOW (ref 12–46)
Lymphs Abs: 0.4 10*3/uL — ABNORMAL LOW (ref 0.7–4.0)
Monocytes Absolute: 0.3 10*3/uL (ref 0.1–1.0)
Monocytes Absolute: 0.4 10*3/uL (ref 0.1–1.0)
Monocytes Relative: 15 % — ABNORMAL HIGH (ref 3–12)
Neutro Abs: 2.3 10*3/uL (ref 1.7–7.7)
Neutro Abs: 3.7 10*3/uL (ref 1.7–7.7)
Neutrophils Relative %: 73 % (ref 43–77)
Neutrophils Relative %: 80 % — ABNORMAL HIGH (ref 43–77)

## 2011-02-13 LAB — RETICULOCYTES
RBC.: 3.16 MIL/uL — ABNORMAL LOW (ref 4.22–5.81)
Retic Count, Absolute: 37.9 10*3/uL (ref 19.0–186.0)
Retic Ct Pct: 1.2 % (ref 0.4–3.1)

## 2011-02-13 LAB — CBC
HCT: 28.5 % — ABNORMAL LOW (ref 39.0–52.0)
HCT: 29.3 % — ABNORMAL LOW (ref 39.0–52.0)
HCT: 30.6 % — ABNORMAL LOW (ref 39.0–52.0)
HCT: 34.3 % — ABNORMAL LOW (ref 39.0–52.0)
Hemoglobin: 10.6 g/dL — ABNORMAL LOW (ref 13.0–17.0)
Hemoglobin: 11.8 g/dL — ABNORMAL LOW (ref 13.0–17.0)
Hemoglobin: 9.8 g/dL — ABNORMAL LOW (ref 13.0–17.0)
MCHC: 34.5 g/dL (ref 30.0–36.0)
MCV: 94.3 fL (ref 78.0–100.0)
MCV: 94.6 fL (ref 78.0–100.0)
Platelets: 148 10*3/uL — ABNORMAL LOW (ref 150–400)
Platelets: 150 10*3/uL (ref 150–400)
RBC: 3.03 MIL/uL — ABNORMAL LOW (ref 4.22–5.81)
RBC: 3.04 MIL/uL — ABNORMAL LOW (ref 4.22–5.81)
RBC: 3.23 MIL/uL — ABNORMAL LOW (ref 4.22–5.81)
RDW: 12.8 % (ref 11.5–15.5)
RDW: 12.9 % (ref 11.5–15.5)
WBC: 2.6 10*3/uL — ABNORMAL LOW (ref 4.0–10.5)
WBC: 3.1 10*3/uL — ABNORMAL LOW (ref 4.0–10.5)
WBC: 3.2 10*3/uL — ABNORMAL LOW (ref 4.0–10.5)
WBC: 4.3 10*3/uL (ref 4.0–10.5)
WBC: 5.9 10*3/uL (ref 4.0–10.5)

## 2011-02-13 LAB — HEMOGLOBIN AND HEMATOCRIT, BLOOD
HCT: 30.4 % — ABNORMAL LOW (ref 39.0–52.0)
Hemoglobin: 10.1 g/dL — ABNORMAL LOW (ref 13.0–17.0)

## 2011-02-13 LAB — IRON AND TIBC
Iron: 12 ug/dL — ABNORMAL LOW (ref 42–135)
UIBC: 173 ug/dL

## 2011-02-13 LAB — BASIC METABOLIC PANEL
BUN: 9 mg/dL (ref 6–23)
CO2: 26 mEq/L (ref 19–32)
CO2: 29 mEq/L (ref 19–32)
Calcium: 7.5 mg/dL — ABNORMAL LOW (ref 8.4–10.5)
Calcium: 7.5 mg/dL — ABNORMAL LOW (ref 8.4–10.5)
Calcium: 8.2 mg/dL — ABNORMAL LOW (ref 8.4–10.5)
Chloride: 94 mEq/L — ABNORMAL LOW (ref 96–112)
Chloride: 95 mEq/L — ABNORMAL LOW (ref 96–112)
Creatinine, Ser: 0.78 mg/dL (ref 0.4–1.5)
Creatinine, Ser: 0.83 mg/dL (ref 0.4–1.5)
GFR calc Af Amer: >60 mL/min (ref >60)
GFR calc Af Amer: >60 mL/min (ref >60)
GFR calc Af Amer: >60 mL/min (ref >60)
GFR calc Af Amer: >60 mL/min (ref >60)
GFR calc non Af Amer: >60 mL/min (ref >60)
GFR calc non Af Amer: >60 mL/min (ref >60)
GFR calc non Af Amer: >60 mL/min (ref >60)
Glucose, Bld: 123 mg/dL — ABNORMAL HIGH (ref 70–99)
Potassium: 3.6 mEq/L (ref 3.5–5.1)
Potassium: 3.8 mEq/L (ref 3.5–5.1)
Sodium: 130 mEq/L — ABNORMAL LOW (ref 135–145)
Sodium: 131 mEq/L — ABNORMAL LOW (ref 135–145)
Sodium: 132 mEq/L — ABNORMAL LOW (ref 135–145)

## 2011-02-13 LAB — URINALYSIS, ROUTINE W REFLEX MICROSCOPIC
Ketones, ur: 40 mg/dL — AB
Protein, ur: >300 mg/dL — AB
Urobilinogen, UA: 1 mg/dL (ref 0.0–1.0)

## 2011-02-13 LAB — MAGNESIUM: Magnesium: 1.7 mg/dL (ref 1.5–2.5)

## 2011-02-13 LAB — VITAMIN B12: Vitamin B-12: 540 pg/mL (ref 211–911)

## 2011-02-13 LAB — URINE MICROSCOPIC-ADD ON

## 2011-02-13 LAB — TSH: TSH: 0.487 u[IU]/mL (ref 0.350–4.500)

## 2011-02-13 LAB — BRAIN NATRIURETIC PEPTIDE: Pro B Natriuretic peptide (BNP): 429 pg/mL — ABNORMAL HIGH (ref 0.0–100.0)

## 2011-02-13 LAB — PHOSPHORUS: Phosphorus: 2.5 mg/dL (ref 2.3–4.6)

## 2011-02-14 LAB — POCT I-STAT 4, (NA,K, GLUC, HGB,HCT)
Glucose, Bld: 111 mg/dL — ABNORMAL HIGH (ref 70–99)
HCT: 49 % (ref 39.0–52.0)
Hemoglobin: 16.7 g/dL (ref 13.0–17.0)
Potassium: 3.8 mEq/L (ref 3.5–5.1)
Sodium: 132 mEq/L — ABNORMAL LOW (ref 135–145)

## 2011-02-18 LAB — CBC
MCHC: 34 g/dL (ref 30.0–36.0)
MCHC: 34.7 g/dL (ref 30.0–36.0)
MCV: 100.7 fL — ABNORMAL HIGH (ref 78.0–100.0)
MCV: 99.7 fL (ref 78.0–100.0)
Platelets: 135 10*3/uL — ABNORMAL LOW (ref 150–400)
Platelets: 234 10*3/uL (ref 150–400)
RDW: 12.7 % (ref 11.5–15.5)
RDW: 12.8 % (ref 11.5–15.5)

## 2011-02-18 LAB — BASIC METABOLIC PANEL
BUN: 12 mg/dL (ref 6–23)
CO2: 29 mEq/L (ref 19–32)
Calcium: 9.5 mg/dL (ref 8.4–10.5)
Chloride: 92 mEq/L — ABNORMAL LOW (ref 96–112)
Creatinine, Ser: 0.71 mg/dL (ref 0.4–1.5)

## 2011-02-18 LAB — HEMOGLOBIN AND HEMATOCRIT, BLOOD
HCT: 29 % — ABNORMAL LOW (ref 39.0–52.0)
Hemoglobin: 10.5 g/dL — ABNORMAL LOW (ref 13.0–17.0)

## 2011-02-18 LAB — TYPE AND SCREEN
ABO/RH(D): A POS
Antibody Screen: NEGATIVE

## 2011-02-19 NOTE — H&P (Signed)
NAME:  Thomas Ferrell, Thomas Ferrell NO.:  [id number]  MEDICAL RECORD NO.:  [id number]           PATIENT TYPE:  E  LOCATION:  WLED                         FACILITY:  [facility]  PHYSICIAN:  Erick Blinks, MD     DATE OF BIRTH:  1941-11-01  DATE OF ADMISSION:  01/30/2011 DATE OF DISCHARGE:                             HISTORY & PHYSICAL   PRIMARY CARE PHYSICIAN:  Joycelyn Rua, MD, [facility].  CHIEF COMPLAINT:  Changes in vision.  HISTORY OF PRESENT ILLNESS:  This is a 70 year old male with a history of CVA who comes to the emergency department complaining of symptoms similar to his previous CVA.  He reports seeing halos and rainbows around objects.  These rainbows last for 5 to 10 minutes at a time and then they resolve.  This has been happening intermittently for the past 4 days.  The patient does not complain of any other stroke-like symptoms but does say he has had sinus problems recently, including cough, runny nose, frontal headache and congestion causing insomnia for the past 2 nights.  Thomas Ferrell used to be a three-pack-a-day smoker but quit cigarettes and now smokes 3 to 4 cigars daily.  He tells me he does not inhale.  The patient takes an 81 mg aspirin daily.  He used to be on Plavix but was taken off of it by his doctor secondary to severe bruising.  PAST MEDICAL HISTORY:  Significant for: 1. Carotid artery stenosis in 2007.  He is status post right carotid     endarterectomy.  His most recent carotid duplex exam was in January     2010 which showed that his right carotid artery was patent and his     left carotid artery had 40% to 59% stenosis. 2. Bladder cancer.  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 3. Hypertension. 4. Hyperlipidemia. 5. Previous stroke with no residual. 6. Back surgery.  HOME MEDICATIONS: 1. Aspirin 81 mg daily. 2. Lisinopril  20 mg 1 tablet daily. 3. Simvastatin 20 mg 1 tablet daily.  ALLERGIES:  He has no known drug allergies.  REVIEW OF SYSTEMS:  As per HPI, otherwise all systems reviewed and found to be as follows: HEENT:  The patient is here for changes of vision and endorses a frontal sinus headache.  Otherwise he denies any pain in his ears, eyes, nose or throat.  CHEST:  He is having a cough that he believes is due to his sinuses but denies any shortness of breath or hemoptysis. CARDIOVASCULAR:  He denies any chest pain, palpitations, orthopnea or PND.  GASTROINTESTINAL:  He denies any anorexia, vomiting or changes to his bowel habits or abdominal pain.  GENITOURINARY:  He denies any hematuria, dysuria or frequency.  EXTREMITIES:  He denies any decreased range of motion, acute joint pain or swelling in his extremities.  SKIN: He denies any rashes, bruises or lesions.  NEURO:  Again, he is here with visual changes that he describes as rainbows around objects but  he denies any syncope or seizures.  PSYCHIATRIC:  He denies any depression or suicidal ideations.  SOCIAL HISTORY:  As mentioned, he was a three-pack-a-day smoker.  He has changed to mini cigars and smokes 3 to 4 mini cigars daily but says he does not inhale.  He drinks 3 beers daily.  Denies any history of recreational drug use.  He is married, retired.  He is a [employer].  FAMILY HISTORY:  Significant for his mother who had a CVA and died at age 34.  His father who died at age 27 with lung cancer.  He had a sister who died at age 92 with bone cancer.  He has a brother who is alive and well.  PHYSICAL EXAM:  VITAL SIGNS:  Temperature 98.3, pulse 96, respirations 18, blood pressure 158/90.  GENERAL:  This is a thin, well-developed, Caucasian male who has some tobacco stain on his lip and beard.  HEENT: Head is atraumatic, normocephalic.  Eyes are anicteric with pupils that are equal, round and slow to react to light.  His nose shows no  nasal discharge or exterior lesions.  Mouth has moist mucous membranes, some discoloration on his tongue that I believe is associated with tobacco. No exudates or erythema in his oropharynx.  He has dentures in place. NECK:  Supple with midline trachea.  No JVD.  No lymphadenopathy.  I do not detect any carotid bruits.  CHEST:  Clear without wheezes or crackles.  No increased work of breathing.  HEART:  Regular rate and rhythm without obvious murmurs, rubs or gallops.  ABDOMEN:  Thin, soft, nontender, nondistended with bowel sounds.  No appreciable masses. EXTREMITIES:  No clubbing, cyanosis or edema.  He is able to move all four extremities and has 5/5 strength in each.  SKIN:  No rashes, bruises or lesions.  NEUROLOGIC:  Cranial nerves II-XII appear to be grossly intact.  He has no facial asymmetries, no obvious focal neuro deficits.  PSYCHIATRIC:  The patient is alert and oriented.  His demeanor is pleasant, cooperative.  His grooming is moderate.  PERTINENT LABORATORY AND X-RAY DATA:  Pertinent for a hemoglobin of 13.8, hematocrit 40.6, white count 7.0, platelets 170.  BUN 8, creatinine 0.83, glucose 111, sodium 139, potassium 4.3.  CT of his head shows no acute intracranial abnormality.  They do mention a small focal hypodensity that is new since 2008 in the left subinsular cortex. However, they believe that this is not acute, more than likely it his remote.  ASSESSMENT:  Dr. Erick Blinks has seen and examined the patient, collected his history, reviewed his chart, and spoken at length with the PA, the patient's wife and the patient about the case.  His impression is: 1. This is a pleasant 70 year old male who presents to the ED with     visual changes reminiscent of his previous stroke.  Need to rule     out cerebrovascular accident or transient ischemic attack. 2. He has continued tobacco abuse. 3. Hypertension. 4. Hyperlipidemia. 5. History of bladder cancer. 6. History  of right carotid stenosis, status post endarterectomy.     Current left carotid stenosis that in January 2010 was measured at     40% to 59%.  PLAN: 1. Will admit him to a telemetry bed.  Will initiate a stroke workup     inclusive of CMET, PT/INR, PTT, UA, hemoglobin A1c, cardiac     enzymes, and a fasting lipid in the morning.  Will also check an  MRI of his brain and MRA, with an MRA of his neck to evaluate him     for any possible stenosis of his arteries, either intracranial or     carotid.  PT and OT will be consulted.  Will start him on 325 mg     aspirin daily.  He will be on Lovenox for VTE prophylaxis. 2. For his tobacco abuse:  In the ED, we have advised him to stop     smoking, that this is the best thing that he can do in order to     prevent any future stroke.  However, will also ask for tobacco     cessation counseling while he is in-house. 3. For his hypertension and hyperlipidemia:  Will continue his     lisinopril and simvastatin. 4. This patient is a full code. 5. Further recommendations will be forthcoming pending his medical     evolution.  Dictated For:  Erick Blinks, MD.     Stephani Police, PA   ______________________________ Erick Blinks, MD    MLY/MEDQ  D:  01/30/2011  T:  01/30/2011  Job:  [id number]  cc:   Joycelyn Rua, M.D. Fax: [phone]  Electronically Signed by Algis Downs PA on 02/08/2011 01:17:43 PM Electronically Signed by Durward Mallard MEMON  on 02/19/2011 03:34:00 PM

## 2011-03-27 NOTE — Procedures (Signed)
CAROTID DUPLEX EXAM   INDICATION:  Followup of carotid artery disease.   HISTORY:  Diabetes:  No.  Cardiac:  No.  Hypertension:  Yes.  Smoking:  Quit.  Previous Surgery:  Right CEA with DPA on 10/21/2006 by Dr. Madilyn Fireman.  CV History:  Stroke on 09/10/2006, TIA per the patient in 09/2007,  asymptomatic now.  Amaurosis Fugax No, Paresthesias No, Hemiparesis No  Bilateral lower extremity pain in morning and throughout day since TIA.                                       RIGHT             LEFT  Brachial systolic pressure:         134               135  Brachial Doppler waveforms:         Biphasic          Biphasic  Vertebral direction of flow:        Antegrade         Antegrade  DUPLEX VELOCITIES (cm/sec)  CCA peak systolic                   80                103  ECA peak systolic                   98                93  ICA peak systolic                   125               120  ICA end diastolic                   41                43  PLAQUE MORPHOLOGY:                  Homogenous        Calcified with  shadow  PLAQUE AMOUNT:                      Moderate          Moderate  PLAQUE LOCATION:                    Distal ICA        ICA/CCA   IMPRESSION:  1. The right CEA area of ICA shows no evidence of restenosis, however      velocities in distal ICA suggest a stenosis of 40-59%.  2. The left ICA shows evidence of 40-59% stenosis, an increase from      previous study.  3. Dr. Madilyn Fireman was informed.   ___________________________________________  P. Liliane Bade, M.D.   AS/MEDQ  D:  11/20/2007  T:  11/21/2007  Job:  [id number]

## 2011-03-27 NOTE — Procedures (Signed)
CAROTID DUPLEX EXAM   INDICATION:  Follow up of known carotid artery disease.   HISTORY:  Diabetes:  No.  Cardiac:  No.  Hypertension:  Yes.  Smoking:  Quit.  Previous Surgery:  Right carotid endarterectomy with Dacron patch  angioplasty on 10/21/2006 by Dr. Madilyn Fireman.  CV History:  Stroke on 09/10/06, TIA per the patient on 09/2007.  Amaurosis Fugax No, Paresthesias No, Hemiparesis No.                                       RIGHT             LEFT  Brachial systolic pressure:         126               124  Brachial Doppler waveforms:         Within normal limits                Within normal limits  Vertebral direction of flow:        Antegrade         Antegrade  DUPLEX VELOCITIES (cm/sec)  CCA peak systolic                   119               128  ECA peak systolic                   134               163  ICA peak systolic                   80                123  ICA end diastolic                   28                37  PLAQUE MORPHOLOGY:                  None              Calcified  PLAQUE AMOUNT:                      None              Moderate  PLAQUE LOCATION:                    None              CCA, BIF, ICA   IMPRESSION:  1. Patent right internal carotid artery with no restenosis.  2. 40-59% stenosis of the left internal carotid artery.   ___________________________________________  P. Liliane Bade, M.D.   AC/MEDQ  D:  11/25/2008  T:  11/26/2008  Job:  [id number]

## 2011-03-27 NOTE — Op Note (Signed)
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   OPERATIVE PROCEDURE:  Transforaminal lumbar interbody fusion (TLIF), L3-  4 with an L4-5 left-sided hemilaminectomy and foraminotomy  (decompression).   FIRST ASSISTANT:  Crissie Reese, PA   INSTRUMENTATION USED:  Minimally invasive maximum access portal system  with 6.5 diameter pedicle screws 40 mm in length, except for the right  L3 pedicle, which was 45.  TLIF interbody cage was PEEK interbody cage  packed with Actifuse, regional and local in open left arm.  It was size  10 x 9 x 25.   HISTORY:  This is a very pleasant 70 year old gentleman who has been  having significant back and radicular left leg pain for sometime now.  Attempts at conservative management had failed to alleviate his symptoms  and he continued to severe debilitating pain.  MRI, x-rays, and  appropriate workup indicated that he had degenerative disk disease at L3-  4 with instability as well as foraminal stenosis at L4-5.  After  discussing treatment options, because of the instability at L3-4, I  elected to do interbody fusion and just had an isolated decompression at  L4-5.  All appropriate risks, benefits, and alternatives to surgery were  discussed and consent was obtained.   OPERATIVE NOTE:  The patient was brought to the operating room, placed  supine on the operating table.  After successful induction of general  anesthesia and endotracheal  intubation, TEDs, SCD, Foley, and lower  extremity neuro monitoring needles were placed.  The patient was turned  prone onto the Brodowski frame.  All bony prominences well-padded.  The  back was prepped and draped in a standard fashion.  A right-sided  wealthy approach was made.  A skin incision was made centered at the L3-  4 disk space.  Sharp dissection was carried out down to the deep fascia.  Deep fascia was sharply incised and the dilating portals were placed  into the wound.  Once this was done, I placed the portal system in and  secured it to the table by the arm.  I expanded it and I could clearly  visualize the T3 and T3-4 facet complexes.  At this point, I used a  Penfield IV and I placed it over the T3 facet complex confirming that I  was at the L3, placed it over the pedicle and I confirmed I was at the  L3 pedicle.  At this point, I identified the L3-4 facet complex and the  L4 pedicle.  I then used a bur to decorticate.  I at this point  used a  Higher education careers adviser, stripped the facet capsule and then used a high-  speed bur to decorticate 3-4 facet complex.  I also decorticated portion  of the pars.   At this point, I then placed an awl and exposed L4 transverse process  and identified the appropriate starting position from a pedicle screw.  I broached the cortex and advanced a pedicle probe through the pedicle  and into the L4 vertebral body.  I confirmed trajectory in the lateral  and AP planes.  I then probed the hole, and I was pleased that I had a  solid bony canal.  I then obtained a 5.5 tap, connected to the neuro  monitoring device and used a real time EMG neuro monitoring device as I  tapped the pedicle.  I then removed the tap, checked again with a ball-  tip probe, confirming I had a solid bony canal.  At no point, was there  any electrodiagnostic evidence of reach of the pedicle during the  tapping.  I then placed a 40-mm screw down the screw hole until I  had  the appropriate depth.  I had good purchase.  I then tested the  electrodiagnostic with the screw and again there was no evidence of  electrodiagnostic pedicle breach.  I repeated this entire procedure at  the L3 level.  Once I had the right L3-4 pedicle screws placed, I then  reapproximated the skin with just a 2-0 Vicryl and then went to the  contralateral side.  Since his symptoms were left-sided mostly, I  decided to do a left-sided TLIF.  It should be noted that prior to  incision, an appropriate time-out was done.  We confirmed the patient,  procedure, and the left extremity was the more symptomatic side and this  was marked in the preop folding area.   At this point, with the right pedicle screws placed, again a left lobe  incision was made approximately 4 cm off of midline.  I then sharply  dissected down to the deep fascia, deep fascia was sharply incised and I  bluntly dissected using the dilating portals down to the L3-4 space.  I  confirmed, I was at the appropriate level, placed the distractors in and  placed a maximum access device.  At this point, I had good visualization  of the facet complexes.  I then removed some of the midline paraspinal  muscle tissue, so I could expose the lamina of 3 and lamina 4.  At this  point, using a 3-mm Kerrison, I performed a laminotomy of L3.  At this  point, with the laminotomy complete, I then used a fine curette to  remove the ligamentum flavum from the L4 lamina attachment superiorly.  I then used a 2-mm Kerrison to trim down the superior portion of the L4  lamina.  At this point, I was then able to develop a plane underneath  the ligamentum flavum and using a 2- and 3-mm Kerrison, resected the  entire ligamentum flavum.  At this point, I had excellent visualization  of the lateral aspect of the thecal sac.  I then continued my  decompression into lateral recess.  Using a osteotome, I removed the  entire L3 inferior facet complex.   I then developed a plane underneath  the remaining superior L4 facet complex and began using a 2- and 3-mm  Kerrison to resect this.  I resected until I could see the superior  portion of the  L4 pedicle.  I then resected out laterally to remove the  L3 pars and completely unroof the L3 nerve in the neural foramen.  At  this point, I could clearly visualize the medial and inferior portion of  the L3 pedicle and the superior portion of the medial pedicle wall of  L4.  I had complete lateral decompression and exposure for my TLIF.  At  this point, I noticed there was very large engorged epidural veins,  which I coagulated using bipolar electrocautery.  I then gently swept  the thecal sac medially and protected it with D'Errico.   I then confirmed that this was at T4 disk and then incised the L3-4 disk  with a 15-blade scalpel.  Using a combination of pituitary rongeurs,  curettes and Kerrison rongeurs, I resected the entire disk material from  L3-4 disk space.  I then used end cutting and side cutting curettes to  decor to make sure had bleeding endplates.  I then consensually reamed  and trialed until I needed the size 10 trial was the correct space,  which I wanted.  I then took the bone that had had harvested locally and  packed it into the anterior aspect of the disk space.  I then compressed  it down with a small tamp.  I then took the peak size 10 x 9 implant,  packed it with Actifuse and local bone and mounted it to the appropriate  depth in position.  I made sure it was midline, and that it was  horizontal.  At this point, with the TLIF complete, I irrigated  copiously with normal saline, maintained hemostasis with bipolar  electrocautery and then checked the L3 exiting nerve root and traversing  L4 nerve root.  During the case, there was no abnormal EMG firing, nor  was there any evidence of direct trauma.   At this point, I identified the lateral L3 transverse process and used   an awl to breach the cortex.  I was able to advance it with a pedicle  probe through the pedicle and then to the vertebral body.  I then  removed this, palpated with ball-tip feeler, tapped and then again used  live running EMGs neural monitoring while I was tapping.  There was no  evidence of any electrodiagnostic evidence to breach the cortex, nor did  I see any breach by visualizing the lot medial and inferior walls of  pedicle.  I then removed the tap and placed a screw, and probed the  screw, and again it was satisfactory.  EMG nerve conduction  electrodiagnostic testing indicate, it was properly positioned and  direct visualization indicated, it was properly positioned.  I repeated  this entire procedure at the L4 level.  At this point, with the TLIF  completed and the screws in place, I obtained a 30 mm ball-tip rod and  secured it to the pedicle screws and torqued them down.  I then  repositioned the maximum access plate, so I could see the L4-5 disk  space.  At this point, using a fine curette, I developed a plane  underneath the lamina and then used the 3-mm Kerrison to perform a  partial laminotomy of L4.  I again used the same curette to release the  ligamentum flavum from the L5 lamina and at this allowed me to resect  the ligamentum flavum.  I then used a 3-mm and 2-mm Kerrison to  decompress into the lateral recess and a 3-mm Kerrison  out the L4 neural  foramen.  At this point, I could clearly take a hockey stick dural  elevator and pass it in the lateral recess inferiorly, superiorly, and  out of the L4 neural foramen.  There was no undue pressure.  At this  point, with the decompression and TLIF fusion completed, I irrigated the  left wound copiously with normal saline and then closed the deep fascia  with interrupted #1 Vicryl sutures, superficial 2-0 Vicryl sutures, and  a 3-0 Monocryl for the skin.   At this point, I went to the right side and identified pedicle  screws  and placed the rod in place.  At this point, I noticed that the L3 screw  was slightly too far forward and so I replaced it with a 45-mm screw.  I  still had excellent purchase, but it allowed me to connect the rod  easier.  I then took the remaining portion of bone graft, packed it into  the L3-4 facet complex and to the lateral gutter, placed a rod and  secured it and tossed it down.  At this point, a final AP and lateral x-  rays were done, which were satisfactory.  I then irrigated copiously  with normal, closed the fascia with interrupted #1 Vicryl sutures.  At  this time, the skin looked a little bit discolored and so I used a 2-0  Vicryl undyed for the superficial and then rather than using a Monocryl,  I used a running vertical mattress suture.  Steri-Strips and dry  dressing were applied.  The patient was extubated and transferred to  PACU without incident.  At the end of the case, all needle and sponge  counts were correct.      Alvy Beal, MD  Electronically Signed     DDB/MEDQ  D:  05/19/2009  T:  05/20/2009  Job:  [id number]   cc:   [facility]

## 2011-03-30 NOTE — H&P (Signed)
NAME:  Thomas Ferrell, Thomas Ferrell                ACCOUNT NO.:  [id number]   MEDICAL RECORD NO.:  [id number]          PATIENT TYPE:  AMB   LOCATION:  SDS                          FACILITY:  [facility]   PHYSICIAN:  Constance Holster, PA    DATE OF BIRTH:  04-Jul-1941   DATE OF ADMISSION:  DATE OF DISCHARGE:                              HISTORY & PHYSICAL   CHIEF COMPLAINT:  Right internal carotid artery stenosis, symptomatic.   HISTORY AND PHYSICAL:  He is a 70 year old Caucasian male with past  medical history of cerebral vascular accident on September 10, 2006 and  hypertension.  September 10, 2006, patient had an unsteady gait,  clumsiness of his left arm and leg, and slurred speech.  Patient was  admitted to [facility] and had an MRI, which revealed a small  right-sided acute stroke.  Patient did have a carotid Doppler on  September 19, 2006, which showed right internal carotid artery stenosis  at 40% to 59%.  Patient presents to CVTS office on October 17, 2006,  for a history and physical.   Currently, patient denies unsteady an gait.  He does complain of slurred  speech when he is tired at nighttime.  The patient complains of numbness  intermittently in his left fingertip and left toes.  Patient denies any  headaches, nausea, vomiting, vertigo, dizziness, history of falls,  seizures, muscle weakness, dysarthralgia, dysphagia, visual changes,  syncope, presyncope, memory loss, confusion.  Patient was a former  smoker; however, he quit on September 11, 2006.  The patient was put on  Plavix post stroke and he did stop this on October 13, 2006.  Patient  will be admitted to [facility], October 21, 2006, to undergo  elective right carotid aneurysmectomy.   PAST MEDICAL HISTORY:  1. Cerebral vascular accident September 10, 2006.  2. Hypertension.  3. History of tobacco abuse.   PAST SURGICAL HISTORY:  Hemorrhoidectomy.   ALLERGIES:  NO KNOWN DRUG ALLERGIES.   MEDICATIONS:  1.  Lisinopril/HCTZ 10/12.5 mg p.o. daily.  2. Plavix 75 mg 2 daily.  3. Ecotrin 325 mg p.o. daily.   REVIEW OF SYSTEMS:  See HPI for positives and negatives, otherwise  negative for diabetes mellitus, COPD, myocardial infarction,  hypothyroidism, and syncope.  Patient does complain of tingling in left  fingers and toes.  He denies any visual changes.   SOCIAL HISTORY:  The patient is married and lives with his family.  He  does still drive.  Patient is employed as an Retail banker.  Patient  lives in [place].  He did smoke 1 pack a day, cigarettes from 1961 to  September 11, 2006.  Patient drinks 6 glasses of wine a week.   FAMILY HISTORY:  Patient's mother deceased at 67 from a vascular  accident.  Father deceased at 41 with lung cancer.  Patient has a  negative family history for diabetes mellitus and coronary artery  disease.   PHYSICAL EXAMINATION:  VITAL SIGNS:  Blood pressure 140/72, heart rate  78, respirations 16.  GENERAL:  This is a 70 year old Caucasian male in  no acute distress.  HEENT:  Normocephalic, atraumatic.  Pupils equal, round, reactive to  light and accommodation.  Extraocular movements intact.  Oral mucosa  pink moist.  Sclerae nonicteric.  Dentures complete in upper and lower.  NECK:  Supple.  Palpable carotids, no carotid bruits during  auscultation.  LUNGS:  Respiration measurable on inspiration.  Unlabored.  Clear to  auscultation bilaterally.  CARDIAC:  Regular rate and rhythm.  ABDOMEN:  Soft non tender, non distended.  Normal active bowel sounds  x4.  GU:  Rectal deferred.  EXTREMITIES:  No edema.  Temperature in the lower extremities is warm.  Pulses 2+ radial, femoral, popliteal, dorsalis pedis and posterior  tibial.  NEUROLOGIC:  Non focal, alert, and oriented x4.  Gait steady.  Patient  does not have slurred speech on exam.  Muscle strength 5+ bilaterally  and throughout.  Deep tendon reflexes 2+ and symmetrical.   ASSESSMENT:  1. Right  internal carotid artery stenosis, symptomatic.  2. __________ vascular occlusive disease.   PLAN:  1. Will admit the patient to [facility] under Dr. Madilyn Fireman on      October 21, 2006.  2. Patient will undergo a right carotid endarterectomy.   The risks and benefits are explained to the patient in great detail.  Dr. Madilyn Fireman has seen and evaluated the patient prior to admission and  agrees with the above.      Constance Holster, PA     JMW/MEDQ  D:  10/17/2006  T:  10/18/2006  Job:  [id number]   cc:   Balinda Quails, M.D.

## 2011-03-30 NOTE — Op Note (Signed)
NAME:  SKYE, RODARTE NO.:  [id number]   MEDICAL RECORD NO.:  [id number]          PATIENT TYPE:  INP   LOCATION:  3314                         FACILITY:  [facility]   PHYSICIAN:  Balinda Quails, M.D.    DATE OF BIRTH:  10/18/1941   DATE OF PROCEDURE:  10/21/2006  DATE OF DISCHARGE:                               OPERATIVE REPORT   SURGEON:  Denman George, MD.   ASSISTANT:  Pecola Leisure, PA.   ANESTHESIA:  General endotracheal.   ANESTHESIOLOGIST:  Bedelia Person, MD.   PREOPERATIVE DIAGNOSES:  1. Moderate to severe right internal carotid artery stenosis.  2. Status post nondisabling right hemispheric cerebrovascular      accident.   POSTOPERATIVE DIAGNOSES:  1. Moderate to severe right internal carotid artery stenosis.  2. Status post nondisabling right hemispheric cerebrovascular      accident.   PROCEDURES:  Right carotid endarterectomy with Dacron patch angioplasty.   CLINICAL NOTE:  Mr. Lynden Flemmer is a 70 year old male referred for  evaluation of right carotid occlusive disease.  Initially admitted to  [facility] on October 30 of this year with a small right  hemispheric stroke and some clumsiness of his left hand.  Doppler  evaluation along with MR angiography revealed a 40-60% right ICA  stenosis.  He is brought to the operating room at this time for right  carotid endarterectomy.   OPERATIVE PROCEDURE:  The patient brought to the operating room in  stable condition.  Placed under general endotracheal anesthesia.  The  Foley catheter arterial line inserted.  The right neck prepped and  draped in a sterile fashion.   A curvilinear skin incision made along the anterior border of the right  sternomastoid muscle.  Dissection carried down through the subcutaneous  tissue with electrocautery.  Deep dissection carried down to expose the  carotid sheath.  Facial vein ligated with 2-0 silk and divided.  The  carotid bifurcation exposed. The  common carotid artery mobilized down  the omohyoid muscle and encircled with a Vessiloop.  The carotid  bifurcation further exposed and the origin of superior thyroid and  external carotid were freed and encircled with Vessiloops.  The internal  carotid artery was followed distally up to the posterior belly of the  digastric muscle.  The distal internal carotid artery encircled with a  Vessiloop.   The carotid bifurcation revealed plaque disease extending into the  origin of the right internal carotid artery.   The patient administered 7000 units of heparin intravenously.  Adequate  circulation time permitted.  The carotid vessels controlled with clamps.  A longitudinal arteriotomy made in the distal common carotid artery.  The arteriotomy extended across carotid bulb and up into the internal  carotid artery.  There was large amount of plaque present in the carotid  bifurcation.  An area for recent hemorrhage in the plaque was identified  and was likely the source of his stroke.  A shunt was then inserted.   An endarterectomy elevator was then used to remove the plaque.  The  endarterectomy carried down to  the common carotid artery where the  plaque was divided transversely with Potts scissors.  The plaque then  raised up into the bulb where the superior thyroid and external carotid  were endarterectomized using an eversion technique.  The distal internal  carotid artery plaque then raised up and feathered out well.  Fragments  of plaque removed with fine forceps.  The site irrigated with heparin  saline solution.   A patch angioplasty endarterectomy site was then carried out with a  running 6-0 Prolene suture.  A Finesse patch was placed.  Completion of  the patch angioplasty, the shunt was removed.  All vessels well flushed.  Clamps removed directing initial antegrade flow up the external carotid  artery, following this the internal carotid was released.  Excellent  pulse and  Doppler signal in the distal internal carotid artery.   The patient administered 50 mg protamine intravenously.  Adequate  hemostasis obtained.  Sponge and instrument counts correct.   The sternomastoid fascia closed with running 2-0 Vicryl suture.  Platysma closed with running 3-0 Vicryl suture. The skin closed with 4-0  Monocryl.  Steri-Strips applied.  Sterile dressing applied.   Anesthesia reversed in the operating room.  The patient awakened  readily.  Moved all extremities to command.  Transferred to recovery  room in stable condition.      Balinda Quails, M.D.  Electronically Signed     PGH/MEDQ  D:  10/21/2006  T:  10/22/2006  Job:  [id number]   cc:   Georgann Housekeeper, MD

## 2011-03-30 NOTE — Discharge Summary (Signed)
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   OPERATIVE PROCEDURE:  On May 19, 2009, was a transforaminal lumbar  interbody fusion at the L3-4 level with a L4-5 left-sided  hemilaminectomy and foraminotomy.   CONSULTATIONS OBTAINED:  None.   BRIEF HISTORY OF PRESENT ILLNESS:  Thomas Ferrell is a very pleasant 70-year-  old gentleman who has been having significant back and radicular left  leg pain for some time now.  Unfortunately all attempts at conservative  management consisting of physical therapy, injection therapy, both  narcotic and nonnarcotic medication have failed to alleviate his  symptoms and he has had continued to have severe debilitating pain.  MRI  x-rays and all appropriate workup have indicated that he has lumbar  degenerative disk disease at the L3-4 level with instability as well as  some foraminal stenosis at the L4-5 level.  After discussing treatment  options and because of the instability, Dr. Shon Baton felt that the  interbody fusion was the best attempt providing Thomas Ferrell with the best  possible surgical outcome.  All appropriate risks, benefits, and  alternatives to the surgery were discussed and Thomas Ferrell did consent to  the procedure.   HOSPITAL COURSE:  The patient's hospital course was approximately 48  hours in length.  The patient tolerated the procedure very well and was  transferred from the PACU to the Orthopedic Floor without  incidence.  Postoperatively on day #1, the patient did have some complaints of  insomnia.  However, vital signs were stable, he was afebrile.  Neurovascularly, he was intact.  He had no complaints of shortness of  breath and/or chest pain.  His abdomen was soft and nontender.  His  incision was clean, dry, and intact and his compartments were soft and  nontender.  The patient began to work well with physical therapy.  Postoperatively on day #2, the patient again made positive gains with  physical therapy.  His vital signs were stable.  His pain was well  controlled on oral medications.  Again, his vital signs were afebrile  and stable.  His incision was clean, dry, and intact.  He was tolerating  regular diet.  He was voiding on his own, having regular bowel and  bladder movements, and his x-rays demonstrated satisfactory lumbar  decompression and satisfactory placement of the hardware.  Therefore,  the patient was deemed stable to be discharged home with a home health  service.   DISPOSITION:  The patient is being discharged home with [facility].  DISCHARGE PHYSICAL EXAMINATION:  VITAL SIGNS:  Temperature of 97.7,  pulse of 67, blood pressure of 104/64, and he was saturating 94% on room  air.   The patient's hemoglobin and hematocrit at discharge included a  hemoglobin of 10.5 and hematocrit of 29.   DISCHARGE MEDICATIONS:  The patient is being discharged home on his home  medication of:  1. Simvastatin 20 mg 1 tablet daily.  2. Lisinopril/chlorothiazide 20/25 one tablet daily.  3. Aspirin 81 mg 1 tablet daily.   The patient is to discontinue use of his hydrocodone 10/325.  The  patient is getting new medications at discharge and these include:  1. Percocet 10/325 one tablet every 6 hours as needed for pain.  2. Robaxin 500 mg 1 tablet every hours as needed for pain.   DISCHARGE INSTRUCTIONS:  The patient was given a preprinted discharge   instructions that went over his activity level, diet, and postop  instructions.  The patient is to walk every day as much as tolerated.  The patient may increase his activity slowly.  He may walk up steps.  He  may shower postoperatively day #5.  The patient is not to soak or  submerge the incision under water.  The patient is not to lift anything  heavier than 6 pounds.  He is not to drive for 6 weeks.  The patient  needs to change his dressing daily for 7 days.  The patient is to  contact our office at [phone] if he experiences any fevers or chills,  redness around the incision site, any drainage around the incision site,  increasing leg pain, or loss of bowel or bladder functions.   FOLLOWUP:  The patient is instructed to follow up with our office in 2  weeks.  The patient is to call and schedule this appointment at [phone]  [phone].  At that point in time, we will be removing his sutures and doing  a wound check.      Crissie Reese, PA      Alvy Beal, MD  Electronically Signed    AC/MEDQ  D:  06/27/2009  T:  06/28/2009  Job:  [id number]

## 2011-03-30 NOTE — Discharge Summary (Signed)
NAME:  Thomas Ferrell, Thomas Ferrell NO.:  [id number]   MEDICAL RECORD NO.:  [id number]          PATIENT TYPE:  INP   LOCATION:  3314                         FACILITY:  [facility]   PHYSICIAN:  Thomas Ferrell, P.A.DATE OF BIRTH:  [birth year]   DATE OF ADMISSION:  10/21/2006  DATE OF DISCHARGE:  10/23/2006                               DISCHARGE SUMMARY   ADMISSION DIAGNOSIS:  Right internal carotid artery stenosis,  symptomatic.   SECONDARY DIAGNOSES:  1. Right internal carotid artery stenosis, symptomatic, status post      right carotid endarterectomy.  2. Postoperative hypotension with history of hypertension.  3. History of tobacco abuse.  4. Cerebrovascular accident September 10, 2006.  5. History of hemorrhoidectomy.   ALLERGIES:  No known drug allergies.   PROCEDURE:  October 21, 2006, right carotid endarterectomy with Dacron  patch angioplasty by Dr. Denman George.   BRIEF HISTORY:  Thomas Ferrell is a 70 year old Caucasian male with a past  medical history of cerebrovascular accident on September 10, 2006.  At  that time, symptoms were unsteady gait, clumsiness of his left arm and  leg and slurred speech.  He was admitted to [facility] and had  an MRI which revealed a small right-sided acute stroke.  He had a  carotid Doppler on August 19, 2006, which showed right internal carotid  artery stenosis at 40% to 59%.  He was evaluated by  vascular surgeon,  Dr. Madilyn Fireman, who recommended a right carotid endarterectomy for reduction  of further stroke risks.  This was scheduled for October 21, 2006.  Of  note, he had been placed on Plavix preoperatively with orders to stop  this one week prior to surgery.   His preoperative history and physical, he reported his unsteady gait had  resolved.  He did report some slurred speech at nighttime when he is  feeling tired.  He also has intermittent numbness in his left fingertip  and left toes.  Otherwise, he was asymptomatic.   He is a former smoker  but quit smoking September 11, 2006.   HOSPITAL COURSE:  Thomas Ferrell was electively admitted to [facility] on October 21, 2006, and underwent a right carotid  endarterectomy.  He was extubated and neurologically intact, and after  short stay in the recovery room, he was transferred to [facility].   At the time of dictation, his postoperative course has been relatively  uneventful other than  hypotension.  He has required a dopamine drop.  He is maintained in sinus rhythm in the 60s and 70s.  He has had an  intermittent low-grade temperature around 100 which is felt attributable  to atelectasis but has overall been afebrile.  He initially required  supplemental oxygen at 2 liters per nasal cannula, but this was  eventually weaned.  He has been able to void following removal of his  Foley catheter.   His postoperative labs have been stable and showed a white blood count  of 9.2, hemoglobin 12.3, hematocrit 34.5, platelet count 153, sodium  134, potassium 3.4, BUN 8,  creatinine 0.9, and glucose 129.  He was  supplemented with potassium for mild hypokalemia.   Postoperatively, he reported no dysphagia.  He was able to ambulate and  tolerate p.o. intake.  His heart had a regular rate and rhythm.  Lungs  sounds were clear and diminished on the left with crackles at the right  base.  His abdominal exam was benign, and his extremities showed no  edema, and incision appeared clean, dry, and intact without evidence of  hematoma.  Neurologically, he remained intact.  His pain was controlled  with oral medication.   By the afternoon of postoperative day 1, he still continued to require a  low-dose dopamine drip, but wean was in progress.  If we are able to  soon wean him from the dopamine, there is a possibility he could go home  on the afternoon of postoperative day 1, otherwise, we will anticipate  that he will be discharged on postoperative day 2,  October 23, 2006.   Of note, his lisinopril/hydrochlorothiazide was held during his  hospitalization secondary to his hypotension.  Dr. Madilyn Fireman also instructed  him that he could stay off his Plavix but should continue taking regular  strength aspirin.   DISCHARGE MEDICATIONS:  1. Coated aspirin 325 mg p.o. daily.  2. Lisinopril/HCTZ 10/12.5 mg p.o. daily.  We will plan to resume once      discharged unless he continues to have hypotension.  3. Tylox 1 to 2 tablets p.o. q.4 h. p.r.n. pain.   DISCHARGE INSTRUCTIONS:  He is instructed to continue with a low-salt  diet.  He is to increase his activity slowly.  He should avoid driving  and heavy lifting for the next two weeks.  He may shower and clean his  incision gently with soap and water.  He is to notify us if he develops  fever greater than 101, or any drainage from his incision site.   FOLLOWUP:  He is to follow up with Dr. Madilyn Fireman at his [facility] office in  approximately two weeks.  His [facility] office will contact him regarding  specific appointment date and time.  He may call sooner if needed.      Thomas Ferrell, P.A.     AWZ/MEDQ  D:  10/22/2006  T:  10/23/2006  Job:  [id number]   cc:   Georgann Housekeeper, MD

## 2011-08-23 LAB — DIFFERENTIAL
Basophils Absolute: 0.1
Basophils Relative: 1
Monocytes Absolute: 0.6
Neutro Abs: 3.2
Neutrophils Relative %: 57

## 2011-08-23 LAB — I-STAT 8, (EC8 V) (CONVERTED LAB)
BUN: 12
Chloride: 103
Glucose, Bld: 102 — ABNORMAL HIGH
Potassium: 3.8
pCO2, Ven: 49.1
pH, Ven: 7.352 — ABNORMAL HIGH

## 2011-08-23 LAB — CBC
Hemoglobin: 13.6
MCHC: 34.6
Platelets: 243
RDW: 13.2

## 2011-08-23 LAB — POCT I-STAT CREATININE
Creatinine, Ser: 0.9
Operator id: 288331

## 2011-08-23 LAB — PROTIME-INR: Prothrombin Time: 13

## 2011-08-23 LAB — APTT: aPTT: 30

## 2011-12-04 DIAGNOSIS — Z923 Personal history of irradiation: Secondary | ICD-10-CM | POA: Diagnosis not present

## 2011-12-04 DIAGNOSIS — Z8551 Personal history of malignant neoplasm of bladder: Secondary | ICD-10-CM | POA: Diagnosis not present

## 2012-04-01 DIAGNOSIS — Z8551 Personal history of malignant neoplasm of bladder: Secondary | ICD-10-CM | POA: Diagnosis not present

## 2012-04-03 DIAGNOSIS — I10 Essential (primary) hypertension: Secondary | ICD-10-CM | POA: Diagnosis not present

## 2012-04-03 DIAGNOSIS — M47817 Spondylosis without myelopathy or radiculopathy, lumbosacral region: Secondary | ICD-10-CM | POA: Diagnosis not present

## 2012-04-10 DIAGNOSIS — M47817 Spondylosis without myelopathy or radiculopathy, lumbosacral region: Secondary | ICD-10-CM | POA: Diagnosis not present

## 2012-04-16 DIAGNOSIS — Z1211 Encounter for screening for malignant neoplasm of colon: Secondary | ICD-10-CM | POA: Diagnosis not present

## 2012-04-16 DIAGNOSIS — E785 Hyperlipidemia, unspecified: Secondary | ICD-10-CM | POA: Diagnosis not present

## 2012-04-16 DIAGNOSIS — I10 Essential (primary) hypertension: Secondary | ICD-10-CM | POA: Diagnosis not present

## 2012-04-16 DIAGNOSIS — M47817 Spondylosis without myelopathy or radiculopathy, lumbosacral region: Secondary | ICD-10-CM | POA: Diagnosis not present

## 2012-04-16 DIAGNOSIS — I6529 Occlusion and stenosis of unspecified carotid artery: Secondary | ICD-10-CM | POA: Diagnosis not present

## 2012-04-16 DIAGNOSIS — Z23 Encounter for immunization: Secondary | ICD-10-CM | POA: Diagnosis not present

## 2012-04-16 DIAGNOSIS — Z1331 Encounter for screening for depression: Secondary | ICD-10-CM | POA: Diagnosis not present

## 2012-04-16 DIAGNOSIS — Z13220 Encounter for screening for lipoid disorders: Secondary | ICD-10-CM | POA: Diagnosis not present

## 2012-04-30 DIAGNOSIS — I6529 Occlusion and stenosis of unspecified carotid artery: Secondary | ICD-10-CM | POA: Diagnosis not present

## 2012-05-23 DIAGNOSIS — M533 Sacrococcygeal disorders, not elsewhere classified: Secondary | ICD-10-CM | POA: Diagnosis not present

## 2012-06-04 DIAGNOSIS — M47817 Spondylosis without myelopathy or radiculopathy, lumbosacral region: Secondary | ICD-10-CM | POA: Diagnosis not present

## 2012-06-20 DIAGNOSIS — M961 Postlaminectomy syndrome, not elsewhere classified: Secondary | ICD-10-CM | POA: Diagnosis not present

## 2012-07-20 DIAGNOSIS — Z23 Encounter for immunization: Secondary | ICD-10-CM | POA: Diagnosis not present

## 2012-07-22 DIAGNOSIS — M961 Postlaminectomy syndrome, not elsewhere classified: Secondary | ICD-10-CM | POA: Diagnosis not present

## 2012-07-22 DIAGNOSIS — Z79899 Other long term (current) drug therapy: Secondary | ICD-10-CM | POA: Diagnosis not present

## 2012-07-22 DIAGNOSIS — IMO0002 Reserved for concepts with insufficient information to code with codable children: Secondary | ICD-10-CM | POA: Diagnosis not present

## 2012-07-22 DIAGNOSIS — M47817 Spondylosis without myelopathy or radiculopathy, lumbosacral region: Secondary | ICD-10-CM | POA: Diagnosis not present

## 2012-07-30 DIAGNOSIS — M47817 Spondylosis without myelopathy or radiculopathy, lumbosacral region: Secondary | ICD-10-CM | POA: Diagnosis not present

## 2012-09-17 ENCOUNTER — Encounter: Payer: Self-pay | Admitting: Vascular Surgery

## 2012-09-30 DIAGNOSIS — Z8551 Personal history of malignant neoplasm of bladder: Secondary | ICD-10-CM | POA: Diagnosis not present

## 2012-10-16 DIAGNOSIS — I10 Essential (primary) hypertension: Secondary | ICD-10-CM | POA: Diagnosis not present

## 2012-10-16 DIAGNOSIS — E785 Hyperlipidemia, unspecified: Secondary | ICD-10-CM | POA: Diagnosis not present

## 2013-01-01 DIAGNOSIS — R05 Cough: Secondary | ICD-10-CM | POA: Diagnosis not present

## 2013-01-21 DIAGNOSIS — J329 Chronic sinusitis, unspecified: Secondary | ICD-10-CM | POA: Diagnosis not present

## 2013-03-03 ENCOUNTER — Encounter: Payer: Self-pay | Admitting: Neurology

## 2013-03-03 ENCOUNTER — Ambulatory Visit (INDEPENDENT_AMBULATORY_CARE_PROVIDER_SITE_OTHER): Payer: Medicare Other | Admitting: Neurology

## 2013-03-03 VITALS — BP 138/68 | HR 76 | Ht 63.0 in | Wt 107.0 lb

## 2013-03-03 DIAGNOSIS — R269 Unspecified abnormalities of gait and mobility: Secondary | ICD-10-CM | POA: Diagnosis not present

## 2013-03-03 DIAGNOSIS — M545 Low back pain: Secondary | ICD-10-CM | POA: Diagnosis not present

## 2013-03-03 HISTORY — DX: Unspecified abnormalities of gait and mobility: R26.9

## 2013-03-03 NOTE — Progress Notes (Signed)
Reason for visit: Back pain  Thomas Ferrell is a 72 y.o. male  History of present illness:  Thomas Ferrell is a 72 year old right-handed white male with a history of lumbosacral spine surgery that was done in 2010. The patient was having a lot of back pain and pain down the left leg prior to the surgery. The patient initially got some benefit, but he has begun to have discomfort in the low back once again over the last couple of years. The patient has been seen by Dr. Ethelene Hal, and epidural steroid injections would help only a couple of days, but the pain would return. The patient indicates that when he is sitting or standing, he has no discomfort. If he begins to walk, the pain will ensue after about 30 minutes. The patient will have discomfort into the hips bilaterally, and some numbness down the left leg. The patient has had chronic gait instability following a stroke that affected the right brain and left body in 2007. The patient denies any falls, but he walks with a cane. The patient indicates that he has some weakness involving the left leg. The patient indicates that he has minimal numbness of the left foot, and the numbness generally ensues when he is walking. The patient denies problems controlling the bowels or the bladder. The patient is sent to this office for further evaluation.  Past Medical History  Diagnosis Date  . Hypertension   . CVA (cerebral vascular accident) 2007    r-cva  . bladder ca dx'd 11/2009    chemo/xrt comp 02/2010  . Dyslipidemia   . Cerebrovascular disease     right brain CVA 2007  . Lumbago   . Abnormality of gait 03/03/2013    Past Surgical History  Procedure Laterality Date  . Carotid artery angioplasty  2009  . Back surgery  2010  . Bladder cancer      Family History  Problem Relation Age of Onset  . Stroke Mother   . Cancer Father   . Dementia Father   . Cancer Sister     Social history:  reports that he quit smoking about 7 years ago. His  smoking use included Cigarettes. He smoked 0.00 packs per day. He does not have any smokeless tobacco history on file. He reports that  drinks alcohol. He reports that he does not use illicit drugs.  Medications:  No current outpatient prescriptions on file prior to visit.   No current facility-administered medications on file prior to visit.    Allergies:  Allergies  Allergen Reactions  . Morphine And Related Itching    ROS:  Out of a complete 14 system review of symptoms, the patient complains only of the following symptoms, and all other reviewed systems are negative.  Easy bruising Joint pain, muscle cramps, achy muscles Allergies Numbness, weakness  Blood pressure 138/68, pulse 76, height 5\' 3"  (1.6 m), weight 107 lb (48.535 kg).  Physical Exam  General: The patient is alert and cooperative at the time of the examination.  Head: Pupils are equal, round, and reactive to light. Discs are flat bilaterally.  Neck: The neck is supple, no carotid bruits are noted on the left, a bruit is noted on the right.  Respiratory: The respiratory examination is clear.  Cardiovascular: The cardiovascular examination reveals a regular rate and rhythm, no obvious murmurs or rubs are noted.  Skin: Extremities are without significant edema.  Neurologic Exam  Mental status:  Cranial nerves: Facial symmetry is present.  There is good sensation of the face to pinprick and soft touch bilaterally. The strength of the facial muscles and the muscles to head turning and shoulder shrug are normal bilaterally. Speech is well enunciated, no aphasia or dysarthria is noted. Extraocular movements are full. Visual fields are full.  Motor: The motor testing reveals 5 over 5 strength of all 4 extremities. Good symmetric motor tone is noted throughout.  Sensory: Sensory testing is intact to pinprick, soft touch, vibration sensation, and position sense on all 4 extremities, with the exception that there is  a decrease in vibration sensation of the left foot. No evidence of extinction is noted.  Coordination: Cerebellar testing reveals good finger-nose-finger and heel-to-shin bilaterally.  Gait and station: Gait is notable for a slightly wide-based gait, with a circumduction gait involving the left leg. Tandem gait is unsteady. Romberg is negative. No drift is seen.  Reflexes: Deep tendon reflexes are symmetric in the arms, but the left knee jerk reflex is elevated. The ankle jerk reflexes are depressed bilaterally. Toes are downgoing bilaterally.   Assessment/Plan:  1. Low back pain, left leg discomfort  The patient reports some problems with back pain, bilateral hip pain, and left leg numbness that occurs with walking. The patient will need to be reevaluated for a possible pseudo-claudication syndrome. The patient will have MRI evaluation of the low back. The patient will have nerve conductions of both legs, and EMG evaluation of the left leg. On a prior CT of the pelvis, there was dense calcification of the iliac arteries bilaterally. If the neurologic workup is unremarkable, the patient may need to be evaluated for a possible claudication syndrome. The patient will followup with EMG evaluation.  Thomas Palau MD 03/03/2013 8:29 PM  [facility] [address] [place], [place] [address]  Phone [phone] Fax [phone]

## 2013-03-03 NOTE — Patient Instructions (Signed)
We will check a MRI of the low back and a nerve study on the legs. (EMG and NCV).

## 2013-03-06 ENCOUNTER — Encounter (INDEPENDENT_AMBULATORY_CARE_PROVIDER_SITE_OTHER): Payer: Medicare Other

## 2013-03-06 ENCOUNTER — Ambulatory Visit (INDEPENDENT_AMBULATORY_CARE_PROVIDER_SITE_OTHER): Payer: Medicare Other | Admitting: Neurology

## 2013-03-06 DIAGNOSIS — G544 Lumbosacral root disorders, not elsewhere classified: Secondary | ICD-10-CM | POA: Diagnosis not present

## 2013-03-06 DIAGNOSIS — R269 Unspecified abnormalities of gait and mobility: Secondary | ICD-10-CM

## 2013-03-06 DIAGNOSIS — M545 Low back pain, unspecified: Secondary | ICD-10-CM

## 2013-03-06 DIAGNOSIS — Z0289 Encounter for other administrative examinations: Secondary | ICD-10-CM

## 2013-03-06 NOTE — Procedures (Signed)
  HISTORY:  Thomas Ferrell is a 72 year old gentleman with a history of lumbosacral spine surgery in the past. Within the last 2 or 3 years, the patient has had problems with ongoing pain and numbness down the left leg with walking. The patient will have onset of symptoms after walking for about 30 minutes, and the pain goes away with rest. The patient is being evaluated for a possible lumbosacral radiculopathy.  NERVE CONDUCTION STUDIES:  Nerve conduction studies were performed on both lower extremities. The distal motor latencies for the peroneal nerves were normal bilaterally, with normal motor amplitudes for these nerves bilaterally. The distal motor latencies for the posterior tibial nerves were normal on the right, and borderline normal on the left. The motor amplitude for the right posterior tibial nerve was normal, low on the left. The nerve conduction velocities for the peroneal and posterior tibial nerves were normal bilaterally, and the peroneal sensory latencies were normal bilaterally. The H reflex latencies were symmetric and normal.  EMG STUDIES:  EMG study was performed on the left lower extremity:  The tibialis anterior muscle reveals 2 to 8K motor units with decreased recruitment. No fibrillations or positive waves were seen. The peroneus tertius muscle reveals 2 to 6K motor units with decreased recruitment. No fibrillations or positive waves were seen. The medial gastrocnemius muscle reveals 2 to 5K motor units with decreased recruitment. No fibrillations or positive waves were seen. The vastus lateralis muscle reveals 2 to 4K motor units with full recruitment. No fibrillations or positive waves were seen. The iliopsoas muscle reveals 2 to 4K motor units with full recruitment. No fibrillations or positive waves were seen. The biceps femoris muscle (long head) reveals 2 to 6K motor units with decreased recruitment. No fibrillations or positive waves were seen. The lumbosacral  paraspinal muscles were tested at 3 levels, and revealed no abnormalities of insertional activity at all 3 levels tested. There was good relaxation.    IMPRESSION:  Nerve conduction studies done on both lower extremities show no evidence of a peripheral neuropathy. There is a low motor amplitude for the left posterior tibial nerve. EMG evaluation of the left lower extremity shows findings consistent with a chronic stable L5 and S1 radiculopathy. No other significant abnormalities were seen.  Marlan Palau MD 03/06/2013 10:49 AM  [facility] [address] [place], [place] [address]  Phone [phone] Fax [phone]

## 2013-03-12 ENCOUNTER — Telehealth: Payer: Self-pay | Admitting: Neurology

## 2013-03-12 DIAGNOSIS — R269 Unspecified abnormalities of gait and mobility: Secondary | ICD-10-CM

## 2013-03-12 DIAGNOSIS — M545 Low back pain: Secondary | ICD-10-CM

## 2013-03-12 DIAGNOSIS — G544 Lumbosacral root disorders, not elsewhere classified: Secondary | ICD-10-CM

## 2013-03-12 NOTE — Telephone Encounter (Signed)
Sherry with [facility] calling to clarify MRI order.  Order placed L Spine wo contrast.  Consulted Dr. Anne Hahn and should be w/wo contrast.  Will add order. I LMVM for Cordelia Pen about this.  She will call back with questions.    Renee with MRI informed.

## 2013-03-19 ENCOUNTER — Ambulatory Visit
Admission: RE | Admit: 2013-03-19 | Discharge: 2013-03-19 | Disposition: A | Discharge status: Home / Self Care | Payer: Medicare Other | Source: Ambulatory Visit | Attending: Neurology | Admitting: Neurology

## 2013-03-19 DIAGNOSIS — R269 Unspecified abnormalities of gait and mobility: Secondary | ICD-10-CM

## 2013-03-19 DIAGNOSIS — G544 Lumbosacral root disorders, not elsewhere classified: Secondary | ICD-10-CM

## 2013-03-19 DIAGNOSIS — M79609 Pain in unspecified limb: Secondary | ICD-10-CM

## 2013-03-19 DIAGNOSIS — M545 Low back pain: Secondary | ICD-10-CM | POA: Diagnosis not present

## 2013-03-19 MED ORDER — GADOBENATE DIMEGLUMINE 529 MG/ML IV SOLN
10.0000 mL | Freq: Once | INTRAVENOUS | Status: AC | PRN
  Administered 2013-03-19: 10 mL via INTRAVENOUS

## 2013-03-20 ENCOUNTER — Telehealth: Payer: Self-pay | Admitting: Neurology

## 2013-03-20 DIAGNOSIS — G544 Lumbosacral root disorders, not elsewhere classified: Secondary | ICD-10-CM

## 2013-03-20 NOTE — Telephone Encounter (Signed)
I called patient. The patient has had a MRI of the lumbosacral spine shows severe neuroforaminal narrowing impinging upon the L3 nerve roots bilaterally. I'll set the patient up for a transforaminal epidural injection at that level. If this is not effective, a referral to a neurosurgeon may be indicated.

## 2013-03-23 ENCOUNTER — Other Ambulatory Visit: Payer: Self-pay | Admitting: Neurology

## 2013-03-23 DIAGNOSIS — M549 Dorsalgia, unspecified: Secondary | ICD-10-CM

## 2013-03-24 ENCOUNTER — Other Ambulatory Visit: Payer: Self-pay | Admitting: Neurology

## 2013-03-24 ENCOUNTER — Ambulatory Visit
Admission: RE | Admit: 2013-03-24 | Discharge: 2013-03-24 | Disposition: A | Discharge status: Home / Self Care | Payer: PRIVATE HEALTH INSURANCE | Source: Ambulatory Visit | Attending: Neurology | Admitting: Neurology

## 2013-03-24 VITALS — BP 161/75 | HR 63

## 2013-03-24 DIAGNOSIS — M549 Dorsalgia, unspecified: Secondary | ICD-10-CM

## 2013-03-24 DIAGNOSIS — M47817 Spondylosis without myelopathy or radiculopathy, lumbosacral region: Secondary | ICD-10-CM | POA: Diagnosis not present

## 2013-03-24 DIAGNOSIS — IMO0002 Reserved for concepts with insufficient information to code with codable children: Secondary | ICD-10-CM | POA: Diagnosis not present

## 2013-03-24 DIAGNOSIS — M545 Low back pain: Secondary | ICD-10-CM

## 2013-03-24 MED ORDER — METHYLPREDNISOLONE ACETATE 40 MG/ML INJ SUSP (RADIOLOG
120.0000 mg | Freq: Once | INTRAMUSCULAR | Status: AC
  Administered 2013-03-24: 120 mg via EPIDURAL

## 2013-03-24 MED ORDER — IOHEXOL 180 MG/ML  SOLN
1.0000 mL | Freq: Once | INTRAMUSCULAR | Status: AC | PRN
  Administered 2013-03-24: 1 mL via EPIDURAL

## 2013-03-30 ENCOUNTER — Other Ambulatory Visit: Payer: Self-pay | Admitting: Neurology

## 2013-03-30 DIAGNOSIS — M549 Dorsalgia, unspecified: Secondary | ICD-10-CM

## 2013-04-07 ENCOUNTER — Other Ambulatory Visit: Payer: Self-pay | Admitting: Neurology

## 2013-04-07 ENCOUNTER — Ambulatory Visit
Admission: RE | Admit: 2013-04-07 | Discharge: 2013-04-07 | Disposition: A | Discharge status: Home / Self Care | Payer: Medicare Other | Source: Ambulatory Visit | Attending: Neurology | Admitting: Neurology

## 2013-04-07 VITALS — BP 150/77 | HR 71

## 2013-04-07 DIAGNOSIS — M545 Low back pain: Secondary | ICD-10-CM | POA: Diagnosis not present

## 2013-04-07 DIAGNOSIS — M549 Dorsalgia, unspecified: Secondary | ICD-10-CM

## 2013-04-07 MED ORDER — IOHEXOL 180 MG/ML  SOLN
1.0000 mL | Freq: Once | INTRAMUSCULAR | Status: AC | PRN
  Administered 2013-04-07: 1 mL via EPIDURAL

## 2013-04-07 MED ORDER — METHYLPREDNISOLONE ACETATE 40 MG/ML INJ SUSP (RADIOLOG
120.0000 mg | Freq: Once | INTRAMUSCULAR | Status: AC
  Administered 2013-04-07: 120 mg via EPIDURAL

## 2013-04-08 ENCOUNTER — Telehealth: Payer: Self-pay | Admitting: Neurology

## 2013-04-08 MED ORDER — GABAPENTIN 300 MG PO CAPS: 300.0000 mg | ORAL_CAPSULE | Freq: Two times a day (BID) | ORAL | Status: DC

## 2013-04-08 NOTE — Telephone Encounter (Signed)
Patient is having severe pain in his back and wants a pain medicine. His doctor that gives him injections does not give pain meds anymore and told him to call Dr. Anne Hahn since he is the one that referred him. Please advise.

## 2013-04-08 NOTE — Telephone Encounter (Signed)
I called patient. The patient has had epidural steroid injections on 2 occasions. The first injection helped for only 3 days. A second injection was done yesterday. The patient in the past is taking hydrocodone for pain without benefit. I'll try gabapentin. If the pain has not improved, a neurosurgical evaluation may be in order. The patient has bilateral L3 nerve root compression by MRI.

## 2013-04-21 ENCOUNTER — Telehealth: Payer: Self-pay | Admitting: Neurology

## 2013-04-21 NOTE — Telephone Encounter (Signed)
Forwarded message to Dr. Willis. 

## 2013-04-21 NOTE — Telephone Encounter (Signed)
I called patient. I left a message. I will call back later. 

## 2013-04-21 NOTE — Telephone Encounter (Signed)
Patient is calling back to speak with Dr. Anne Hahn or his assistant.  Dr. Anne Hahn told this patient (per the patient) to call him if the two injections he's had does not help his back - he has had them and has had no relief.  He asks for someone to please call him back today.  [phone].

## 2013-04-22 ENCOUNTER — Telehealth: Payer: Self-pay | Admitting: Neurology

## 2013-04-22 NOTE — Telephone Encounter (Signed)
I called the patient again, and I left a message. If the patient is not responding to epidural steroid injections, and the pain is significant, we may consider a referral to a surgeon. The patient will let him know if he wishes to do this.

## 2013-04-23 NOTE — Telephone Encounter (Signed)
Pt is calling back is trying to see if there has been a schedule for a Neurosurgeon. He needs someone to call him about his matter. Thanks

## 2013-04-29 ENCOUNTER — Telehealth: Payer: Self-pay | Admitting: Neurology

## 2013-04-29 DIAGNOSIS — G544 Lumbosacral root disorders, not elsewhere classified: Secondary | ICD-10-CM

## 2013-04-29 NOTE — Telephone Encounter (Signed)
I spoke to patient and he said he would like a referral to the neurosurgeon.  I will be glad to place order.

## 2013-04-29 NOTE — Telephone Encounter (Signed)
I will make the referral to a neurosurgeon. The patient has bilateral L3 radiculopathies, and he is not responding to epidural steroid injections.

## 2013-05-08 ENCOUNTER — Telehealth: Payer: Self-pay

## 2013-05-08 ENCOUNTER — Telehealth: Payer: Self-pay | Admitting: Neurology

## 2013-05-08 NOTE — Telephone Encounter (Signed)
I called patient and left him a VM that Dr. Anne Hahn did respond to his request to Nurse Lupita Leash. He will make the reefrral to a Retail buyer. He is away this week but I will send him a reminder.   Thomas Ferrell called right back and said he got a message on June 18 that the referral was already taken care of but he hasn't heard anything from anyone since. I will ask Nurse Lupita Leash then and will get back to him on that.

## 2013-05-08 NOTE — Telephone Encounter (Signed)
I reviewed the patient's record and called the patient back. He was referred on June 20 to [facility]. I gave him the address and phone number.

## 2013-05-11 ENCOUNTER — Telehealth: Payer: Self-pay | Admitting: Neurology

## 2013-05-25 ENCOUNTER — Telehealth: Payer: Self-pay

## 2013-05-25 NOTE — Telephone Encounter (Signed)
Message copied by [facility] on Mon May 25, 2013  3:05 PM ------      Message from: Warren Lacy A      Created: Mon May 11, 2013  8:40 AM       Wants to know if you can resend the fax referral to the neurosurgeon, Attn: Thayer Ohm ------

## 2013-05-25 NOTE — Telephone Encounter (Signed)
I re-faxed referral and sent to the attention of Chris.

## 2013-05-27 DIAGNOSIS — E785 Hyperlipidemia, unspecified: Secondary | ICD-10-CM | POA: Diagnosis not present

## 2013-05-27 DIAGNOSIS — Z1331 Encounter for screening for depression: Secondary | ICD-10-CM | POA: Diagnosis not present

## 2013-05-27 DIAGNOSIS — I10 Essential (primary) hypertension: Secondary | ICD-10-CM | POA: Diagnosis not present

## 2013-06-01 DIAGNOSIS — M47817 Spondylosis without myelopathy or radiculopathy, lumbosacral region: Secondary | ICD-10-CM | POA: Diagnosis not present

## 2013-06-01 DIAGNOSIS — M48061 Spinal stenosis, lumbar region without neurogenic claudication: Secondary | ICD-10-CM | POA: Diagnosis not present

## 2013-06-08 DIAGNOSIS — M48061 Spinal stenosis, lumbar region without neurogenic claudication: Secondary | ICD-10-CM | POA: Diagnosis not present

## 2013-06-10 DIAGNOSIS — M48061 Spinal stenosis, lumbar region without neurogenic claudication: Secondary | ICD-10-CM | POA: Diagnosis not present

## 2013-06-15 DIAGNOSIS — M48061 Spinal stenosis, lumbar region without neurogenic claudication: Secondary | ICD-10-CM | POA: Diagnosis not present

## 2013-06-17 DIAGNOSIS — M48061 Spinal stenosis, lumbar region without neurogenic claudication: Secondary | ICD-10-CM | POA: Diagnosis not present

## 2013-06-22 DIAGNOSIS — M48061 Spinal stenosis, lumbar region without neurogenic claudication: Secondary | ICD-10-CM | POA: Diagnosis not present

## 2013-06-24 DIAGNOSIS — M48061 Spinal stenosis, lumbar region without neurogenic claudication: Secondary | ICD-10-CM | POA: Diagnosis not present

## 2013-06-29 DIAGNOSIS — M48061 Spinal stenosis, lumbar region without neurogenic claudication: Secondary | ICD-10-CM | POA: Diagnosis not present

## 2013-07-01 DIAGNOSIS — M48061 Spinal stenosis, lumbar region without neurogenic claudication: Secondary | ICD-10-CM | POA: Diagnosis not present

## 2013-07-06 DIAGNOSIS — M48061 Spinal stenosis, lumbar region without neurogenic claudication: Secondary | ICD-10-CM | POA: Diagnosis not present

## 2013-07-08 DIAGNOSIS — M48061 Spinal stenosis, lumbar region without neurogenic claudication: Secondary | ICD-10-CM | POA: Diagnosis not present

## 2013-07-15 DIAGNOSIS — M48061 Spinal stenosis, lumbar region without neurogenic claudication: Secondary | ICD-10-CM | POA: Diagnosis not present

## 2013-07-20 DIAGNOSIS — M48061 Spinal stenosis, lumbar region without neurogenic claudication: Secondary | ICD-10-CM | POA: Diagnosis not present

## 2013-07-22 DIAGNOSIS — M48061 Spinal stenosis, lumbar region without neurogenic claudication: Secondary | ICD-10-CM | POA: Diagnosis not present

## 2013-08-04 DIAGNOSIS — Z23 Encounter for immunization: Secondary | ICD-10-CM | POA: Diagnosis not present

## 2013-08-14 ENCOUNTER — Other Ambulatory Visit: Payer: Self-pay | Admitting: Neurosurgery

## 2013-08-14 DIAGNOSIS — M48061 Spinal stenosis, lumbar region without neurogenic claudication: Secondary | ICD-10-CM | POA: Diagnosis not present

## 2013-08-14 DIAGNOSIS — M47817 Spondylosis without myelopathy or radiculopathy, lumbosacral region: Secondary | ICD-10-CM | POA: Diagnosis not present

## 2013-08-20 DIAGNOSIS — J329 Chronic sinusitis, unspecified: Secondary | ICD-10-CM | POA: Diagnosis not present

## 2013-08-20 DIAGNOSIS — F17200 Nicotine dependence, unspecified, uncomplicated: Secondary | ICD-10-CM | POA: Diagnosis not present

## 2013-08-31 ENCOUNTER — Encounter (HOSPITAL_COMMUNITY): Payer: Self-pay | Admitting: Pharmacy Technician

## 2013-09-01 ENCOUNTER — Encounter (HOSPITAL_COMMUNITY)
Admission: RE | Admit: 2013-09-01 | Discharge: 2013-09-01 | Disposition: A | Discharge status: Home / Self Care | Payer: Medicare Other | Source: Ambulatory Visit | Attending: Neurosurgery | Admitting: Neurosurgery

## 2013-09-01 ENCOUNTER — Ambulatory Visit (HOSPITAL_COMMUNITY)
Admission: RE | Admit: 2013-09-01 | Discharge: 2013-09-01 | Disposition: A | Discharge status: Home / Self Care | Payer: Medicare Other | Source: Ambulatory Visit | Attending: Neurosurgery | Admitting: Neurosurgery

## 2013-09-01 ENCOUNTER — Encounter (HOSPITAL_COMMUNITY): Payer: Self-pay

## 2013-09-01 DIAGNOSIS — Z01812 Encounter for preprocedural laboratory examination: Secondary | ICD-10-CM | POA: Insufficient documentation

## 2013-09-01 DIAGNOSIS — Z01818 Encounter for other preprocedural examination: Secondary | ICD-10-CM | POA: Insufficient documentation

## 2013-09-01 DIAGNOSIS — J449 Chronic obstructive pulmonary disease, unspecified: Secondary | ICD-10-CM | POA: Diagnosis not present

## 2013-09-01 DIAGNOSIS — J4489 Other specified chronic obstructive pulmonary disease: Secondary | ICD-10-CM | POA: Insufficient documentation

## 2013-09-01 HISTORY — DX: Cough: R05

## 2013-09-01 HISTORY — DX: Chronic obstructive pulmonary disease, unspecified: J44.9

## 2013-09-01 HISTORY — DX: Other specified cough: R05.8

## 2013-09-01 LAB — CBC
HCT: 42.5 % (ref 39.0–52.0)
Hemoglobin: 14.9 g/dL (ref 13.0–17.0)
MCH: 33.8 pg (ref 26.0–34.0)
MCV: 96.4 fL (ref 78.0–100.0)
RBC: 4.41 MIL/uL (ref 4.22–5.81)
RDW: 12.2 % (ref 11.5–15.5)
WBC: 9.2 10*3/uL (ref 4.0–10.5)

## 2013-09-01 LAB — SURGICAL PCR SCREEN
MRSA, PCR: NEGATIVE
Staphylococcus aureus: NEGATIVE

## 2013-09-01 LAB — BASIC METABOLIC PANEL
BUN: 10 mg/dL (ref 6–23)
CO2: 26 mEq/L (ref 19–32)
Chloride: 96 mEq/L (ref 96–112)
Creatinine, Ser: 0.67 mg/dL (ref 0.50–1.35)
GFR calc Af Amer: >90 mL/min (ref >90)
Glucose, Bld: 90 mg/dL (ref 70–99)
Potassium: 3.8 mEq/L (ref 3.5–5.1)

## 2013-09-01 LAB — TYPE AND SCREEN

## 2013-09-01 NOTE — Progress Notes (Addendum)
SPOKE WITH Thomas Ferrell AT DR. HIRSCH'S OFFICE TO MAKE HER AWARE OF PATIENT BEING TX WITH ANTIBIOTIC FOR SINUS INFECTION .  PATIENT STATED HE IS STILL FEELING CONGESTED IN CHEST W/ COUGHING UP CLEAR SPUTUM. (PATIENT STATES HE WAS ON AMOXICILLIN X5 DAYS AND NOW LEVAQUIN X 7 DAYS).  Thomas Ferrell WILL NOTIFY DR. [facility].

## 2013-09-01 NOTE — Pre-Procedure Instructions (Signed)
Thomas Ferrell  09/01/2013   Your procedure is scheduled on:  Tuesday, October 28th  Report to Main Entrance "A" and check in with admitting at 0530 AM.  Call this number if you have problems the morning of surgery: [phone]   Remember:   Do not eat food or drink liquids after midnight.   Take these medicines the morning of surgery with A SIP OF WATER: Norvasc, Tylenol if needed   Do not wear jewelry.  Do not wear lotions, powders, or perfumes. You may wear deodorant.  Do not shave 48 hours prior to surgery. Men may shave face and neck.  Do not bring valuables to the hospital.  [facility] is not responsible  for any belongings or valuables.               Contacts, dentures or bridgework may not be worn into surgery.  Leave suitcase in the car. After surgery it may be brought to your room.  For patients admitted to the hospital, discharge time is determined by your treatment team.    Special Instructions: Shower using CHG 2 nights before surgery and the night before surgery.  If you shower the day of surgery use CHG.  Use special wash - you have one bottle of CHG for all showers.  You should use approximately 1/3 of the bottle for each shower.   Please read over the following fact sheets that you were given: Pain Booklet, Coughing and Deep Breathing, Blood Transfusion Information, MRSA Information and Surgical Site Infection Prevention

## 2013-09-07 MED ORDER — CEFAZOLIN SODIUM-DEXTROSE 2-3 GM-% IV SOLR
2.0000 g | INTRAVENOUS | Status: AC
  Administered 2013-09-08: 2 g via INTRAVENOUS
  Filled 2013-09-07: qty 50

## 2013-09-08 ENCOUNTER — Inpatient Hospital Stay (HOSPITAL_COMMUNITY): Payer: Medicare Other

## 2013-09-08 ENCOUNTER — Encounter (HOSPITAL_COMMUNITY): Payer: Self-pay | Admitting: *Deleted

## 2013-09-08 ENCOUNTER — Encounter (HOSPITAL_COMMUNITY)
Admission: RE | Disposition: A | Discharge status: Home Health | Payer: Medicare Other | Source: Ambulatory Visit | Attending: Neurosurgery

## 2013-09-08 ENCOUNTER — Inpatient Hospital Stay (HOSPITAL_COMMUNITY): Payer: Medicare Other | Admitting: Critical Care Medicine

## 2013-09-08 ENCOUNTER — Encounter (HOSPITAL_COMMUNITY): Payer: Medicare Other | Admitting: Critical Care Medicine

## 2013-09-08 ENCOUNTER — Inpatient Hospital Stay (HOSPITAL_COMMUNITY)
Admission: RE | Admit: 2013-09-08 | Discharge: 2013-09-12 | DRG: 460 | Disposition: A | Discharge status: Home Health | Payer: Medicare Other | Source: Ambulatory Visit | Attending: Neurosurgery | Admitting: Neurosurgery

## 2013-09-08 DIAGNOSIS — Z8673 Personal history of transient ischemic attack (TIA), and cerebral infarction without residual deficits: Secondary | ICD-10-CM

## 2013-09-08 DIAGNOSIS — M48061 Spinal stenosis, lumbar region without neurogenic claudication: Secondary | ICD-10-CM

## 2013-09-08 DIAGNOSIS — J4489 Other specified chronic obstructive pulmonary disease: Secondary | ICD-10-CM | POA: Diagnosis present

## 2013-09-08 DIAGNOSIS — Z87891 Personal history of nicotine dependence: Secondary | ICD-10-CM | POA: Diagnosis not present

## 2013-09-08 DIAGNOSIS — M539 Dorsopathy, unspecified: Secondary | ICD-10-CM | POA: Diagnosis not present

## 2013-09-08 DIAGNOSIS — J449 Chronic obstructive pulmonary disease, unspecified: Secondary | ICD-10-CM | POA: Diagnosis present

## 2013-09-08 DIAGNOSIS — Z79899 Other long term (current) drug therapy: Secondary | ICD-10-CM

## 2013-09-08 DIAGNOSIS — M545 Low back pain: Secondary | ICD-10-CM | POA: Diagnosis not present

## 2013-09-08 DIAGNOSIS — R269 Unspecified abnormalities of gait and mobility: Secondary | ICD-10-CM

## 2013-09-08 DIAGNOSIS — I10 Essential (primary) hypertension: Secondary | ICD-10-CM | POA: Diagnosis present

## 2013-09-08 DIAGNOSIS — M5137 Other intervertebral disc degeneration, lumbosacral region: Secondary | ICD-10-CM | POA: Diagnosis not present

## 2013-09-08 DIAGNOSIS — M47817 Spondylosis without myelopathy or radiculopathy, lumbosacral region: Principal | ICD-10-CM | POA: Diagnosis present

## 2013-09-08 HISTORY — PX: LAMINECTOMY: SHX219

## 2013-09-08 SURGERY — POSTERIOR LUMBAR FUSION 2 WITH HARDWARE REMOVAL
Anesthesia: General | Site: Back | Wound class: Clean

## 2013-09-08 MED ORDER — HYDROMORPHONE HCL PF 1 MG/ML IJ SOLN
0.2500 mg | INTRAMUSCULAR | Status: DC | PRN
  Administered 2013-09-08 (×4): 0.5 mg via INTRAVENOUS

## 2013-09-08 MED ORDER — OXYCODONE-ACETAMINOPHEN 5-325 MG PO TABS
1.0000 | ORAL_TABLET | ORAL | Status: DC | PRN
  Administered 2013-09-09 – 2013-09-10 (×6): 2 via ORAL
  Administered 2013-09-12: 1 via ORAL
  Filled 2013-09-08 (×7): qty 2

## 2013-09-08 MED ORDER — PROMETHAZINE HCL 25 MG/ML IJ SOLN: 12.5000 mg | INTRAMUSCULAR | Status: DC | PRN

## 2013-09-08 MED ORDER — DIPHENHYDRAMINE HCL 12.5 MG/5ML PO ELIX: 12.5000 mg | ORAL_SOLUTION | Freq: Four times a day (QID) | ORAL | Status: DC | PRN

## 2013-09-08 MED ORDER — DIPHENHYDRAMINE HCL 50 MG/ML IJ SOLN
12.5000 mg | Freq: Four times a day (QID) | INTRAMUSCULAR | Status: DC | PRN
  Administered 2013-09-09 (×2): 12.5 mg via INTRAVENOUS
  Filled 2013-09-08 (×2): qty 1

## 2013-09-08 MED ORDER — PROPOFOL 10 MG/ML IV BOLUS
INTRAVENOUS | Status: DC | PRN
  Administered 2013-09-08 (×2): 20 mg via INTRAVENOUS
  Administered 2013-09-08: 120 mg via INTRAVENOUS

## 2013-09-08 MED ORDER — METHOCARBAMOL 500 MG PO TABS: 500.0000 mg | ORAL_TABLET | Freq: Four times a day (QID) | ORAL | Status: DC | PRN

## 2013-09-08 MED ORDER — ACETAMINOPHEN 325 MG PO TABS: 650.0000 mg | ORAL_TABLET | ORAL | Status: DC | PRN

## 2013-09-08 MED ORDER — ACETAMINOPHEN 650 MG RE SUPP: 650.0000 mg | RECTAL | Status: DC | PRN

## 2013-09-08 MED ORDER — PHENYLEPHRINE HCL 10 MG/ML IJ SOLN: INTRAMUSCULAR | Status: DC | PRN

## 2013-09-08 MED ORDER — CEFAZOLIN SODIUM 1-5 GM-% IV SOLN
1.0000 g | Freq: Three times a day (TID) | INTRAVENOUS | Status: DC
  Administered 2013-09-08 – 2013-09-11 (×9): 1 g via INTRAVENOUS
  Filled 2013-09-08 (×12): qty 50

## 2013-09-08 MED ORDER — LISINOPRIL 20 MG PO TABS
20.0000 mg | ORAL_TABLET | Freq: Every day | ORAL | Status: DC
  Administered 2013-09-09 – 2013-09-11 (×3): 20 mg via ORAL
  Filled 2013-09-08 (×4): qty 1

## 2013-09-08 MED ORDER — 0.9 % SODIUM CHLORIDE (POUR BTL) OPTIME
TOPICAL | Status: DC | PRN
  Administered 2013-09-08: 1000 mL

## 2013-09-08 MED ORDER — ATROPINE SULFATE 0.1 MG/ML IJ SOLN
INTRAMUSCULAR | Status: AC
  Filled 2013-09-08: qty 10

## 2013-09-08 MED ORDER — LIDOCAINE HCL (CARDIAC) 20 MG/ML IV SOLN
INTRAVENOUS | Status: DC | PRN
  Administered 2013-09-08: 100 mg via INTRAVENOUS

## 2013-09-08 MED ORDER — ARTIFICIAL TEARS OP OINT
TOPICAL_OINTMENT | OPHTHALMIC | Status: DC | PRN
  Administered 2013-09-08: 1 via OPHTHALMIC

## 2013-09-08 MED ORDER — SODIUM CHLORIDE 0.9 % IJ SOLN: 3.0000 mL | INTRAMUSCULAR | Status: DC | PRN

## 2013-09-08 MED ORDER — MIDAZOLAM HCL 5 MG/5ML IJ SOLN
INTRAMUSCULAR | Status: DC | PRN
  Administered 2013-09-08: 2 mg via INTRAVENOUS

## 2013-09-08 MED ORDER — SODIUM CHLORIDE 0.9 % IR SOLN
Status: DC | PRN
  Administered 2013-09-08: 09:00:00

## 2013-09-08 MED ORDER — ONDANSETRON HCL 4 MG/2ML IJ SOLN
INTRAMUSCULAR | Status: AC
  Filled 2013-09-08: qty 2

## 2013-09-08 MED ORDER — KETOROLAC TROMETHAMINE 30 MG/ML IJ SOLN
30.0000 mg | Freq: Four times a day (QID) | INTRAMUSCULAR | Status: AC
  Administered 2013-09-08 – 2013-09-09 (×5): 30 mg via INTRAVENOUS
  Filled 2013-09-08 (×5): qty 1

## 2013-09-08 MED ORDER — MAGNESIUM HYDROXIDE 400 MG/5ML PO SUSP: 30.0000 mL | Freq: Every day | ORAL | Status: DC | PRN

## 2013-09-08 MED ORDER — HYDROMORPHONE HCL PF 1 MG/ML IJ SOLN
INTRAMUSCULAR | Status: AC
  Filled 2013-09-08: qty 1

## 2013-09-08 MED ORDER — LIDOCAINE-EPINEPHRINE 1 %-1:100000 IJ SOLN
INTRAMUSCULAR | Status: DC | PRN
  Administered 2013-09-08: 20 mL

## 2013-09-08 MED ORDER — CYCLOBENZAPRINE HCL 10 MG PO TABS
10.0000 mg | ORAL_TABLET | Freq: Three times a day (TID) | ORAL | Status: DC | PRN
  Administered 2013-09-08 – 2013-09-10 (×3): 10 mg via ORAL
  Filled 2013-09-08 (×3): qty 1

## 2013-09-08 MED ORDER — HYDROCODONE-ACETAMINOPHEN 5-325 MG PO TABS
1.0000 | ORAL_TABLET | ORAL | Status: DC | PRN
  Administered 2013-09-10 – 2013-09-12 (×4): 2 via ORAL
  Filled 2013-09-08 (×4): qty 2

## 2013-09-08 MED ORDER — HYDROMORPHONE 0.3 MG/ML IV SOLN
INTRAVENOUS | Status: DC
  Administered 2013-09-08: 7.5 mg via INTRAVENOUS
  Administered 2013-09-08: 2.4 mg via INTRAVENOUS
  Administered 2013-09-08: 13:00:00 via INTRAVENOUS
  Administered 2013-09-09: 0.3 mg via INTRAVENOUS
  Administered 2013-09-09: 1.5 mg via INTRAVENOUS
  Administered 2013-09-09: 1.3 mg via INTRAVENOUS
  Filled 2013-09-08: qty 25

## 2013-09-08 MED ORDER — SODIUM CHLORIDE 0.9 % IV SOLN
250.0000 mL | INTRAVENOUS | Status: DC
  Administered 2013-09-08: 250 mL via INTRAVENOUS

## 2013-09-08 MED ORDER — CEFAZOLIN SODIUM-DEXTROSE 2-3 GM-% IV SOLR
INTRAVENOUS | Status: AC
  Filled 2013-09-08: qty 50

## 2013-09-08 MED ORDER — LACTATED RINGERS IV SOLN: INTRAVENOUS | Status: DC

## 2013-09-08 MED ORDER — SODIUM CHLORIDE 0.9 % IJ SOLN: 9.0000 mL | INTRAMUSCULAR | Status: DC | PRN

## 2013-09-08 MED ORDER — ROCURONIUM BROMIDE 100 MG/10ML IV SOLN
INTRAVENOUS | Status: DC | PRN
  Administered 2013-09-08: 20 mg via INTRAVENOUS
  Administered 2013-09-08: 50 mg via INTRAVENOUS
  Administered 2013-09-08: 10 mg via INTRAVENOUS
  Administered 2013-09-08: 20 mg via INTRAVENOUS
  Administered 2013-09-08 (×2): 10 mg via INTRAVENOUS

## 2013-09-08 MED ORDER — ZOLPIDEM TARTRATE 5 MG PO TABS
5.0000 mg | ORAL_TABLET | Freq: Every evening | ORAL | Status: DC | PRN
  Administered 2013-09-09 – 2013-09-11 (×2): 5 mg via ORAL
  Filled 2013-09-08 (×2): qty 1

## 2013-09-08 MED ORDER — GLYCOPYRROLATE 0.2 MG/ML IJ SOLN
INTRAMUSCULAR | Status: DC | PRN
  Administered 2013-09-08: 0.6 mg via INTRAVENOUS

## 2013-09-08 MED ORDER — AMLODIPINE BESYLATE 5 MG PO TABS
5.0000 mg | ORAL_TABLET | Freq: Every day | ORAL | Status: DC
  Administered 2013-09-09 – 2013-09-11 (×3): 5 mg via ORAL
  Filled 2013-09-08 (×4): qty 1

## 2013-09-08 MED ORDER — PROMETHAZINE HCL 25 MG PO TABS
12.5000 mg | ORAL_TABLET | ORAL | Status: DC | PRN
Start: 2013-09-08 — End: 2013-09-12

## 2013-09-08 MED ORDER — ONDANSETRON HCL 4 MG/2ML IJ SOLN
4.0000 mg | INTRAMUSCULAR | Status: DC | PRN
  Administered 2013-09-08: 4 mg via INTRAVENOUS
  Filled 2013-09-08: qty 2

## 2013-09-08 MED ORDER — SODIUM CHLORIDE 0.9 % IJ SOLN
3.0000 mL | Freq: Two times a day (BID) | INTRAMUSCULAR | Status: DC
  Administered 2013-09-08 – 2013-09-12 (×4): 3 mL via INTRAVENOUS

## 2013-09-08 MED ORDER — ONDANSETRON HCL 4 MG/2ML IJ SOLN
INTRAMUSCULAR | Status: DC | PRN
  Administered 2013-09-08: 4 mg via INTRAVENOUS

## 2013-09-08 MED ORDER — FENTANYL CITRATE 0.05 MG/ML IJ SOLN
INTRAMUSCULAR | Status: AC
  Filled 2013-09-08: qty 2

## 2013-09-08 MED ORDER — NEOSTIGMINE METHYLSULFATE 1 MG/ML IJ SOLN
INTRAMUSCULAR | Status: DC | PRN
  Administered 2013-09-08: 4 mg via INTRAVENOUS

## 2013-09-08 MED ORDER — METHOCARBAMOL 100 MG/ML IJ SOLN: 500.0000 mg | Freq: Four times a day (QID) | INTRAVENOUS | Status: DC | PRN

## 2013-09-08 MED ORDER — MIDAZOLAM HCL 2 MG/2ML IJ SOLN: 1.0000 mg | INTRAMUSCULAR | Status: DC | PRN

## 2013-09-08 MED ORDER — FENTANYL CITRATE 0.05 MG/ML IJ SOLN
50.0000 ug | Freq: Once | INTRAMUSCULAR | Status: AC
  Administered 2013-09-08: 50 ug via INTRAVENOUS

## 2013-09-08 MED ORDER — ONDANSETRON HCL 4 MG/2ML IJ SOLN: 4.0000 mg | Freq: Four times a day (QID) | INTRAMUSCULAR | Status: DC | PRN

## 2013-09-08 MED ORDER — BISACODYL 10 MG RE SUPP: 10.0000 mg | Freq: Every day | RECTAL | Status: DC | PRN

## 2013-09-08 MED ORDER — TRAMADOL HCL 50 MG PO TABS: 50.0000 mg | ORAL_TABLET | Freq: Four times a day (QID) | ORAL | Status: DC | PRN

## 2013-09-08 MED ORDER — HYDROMORPHONE 0.3 MG/ML IV SOLN
INTRAVENOUS | Status: AC
  Filled 2013-09-08: qty 25

## 2013-09-08 MED ORDER — FENTANYL CITRATE 0.05 MG/ML IJ SOLN
INTRAMUSCULAR | Status: DC | PRN
  Administered 2013-09-08 (×4): 50 ug via INTRAVENOUS
  Administered 2013-09-08: 150 ug via INTRAVENOUS
  Administered 2013-09-08 (×3): 50 ug via INTRAVENOUS

## 2013-09-08 MED ORDER — NALOXONE HCL 0.4 MG/ML IJ SOLN: 0.4000 mg | INTRAMUSCULAR | Status: DC | PRN

## 2013-09-08 MED ORDER — PHENYLEPHRINE HCL 10 MG/ML IJ SOLN
INTRAMUSCULAR | Status: DC | PRN
  Administered 2013-09-08: 160 ug via INTRAVENOUS
  Administered 2013-09-08 (×3): 80 ug via INTRAVENOUS

## 2013-09-08 MED ORDER — THROMBIN 20000 UNITS EX SOLR
CUTANEOUS | Status: DC | PRN
  Administered 2013-09-08 (×2): via TOPICAL

## 2013-09-08 MED ORDER — LACTATED RINGERS IV SOLN
INTRAVENOUS | Status: DC | PRN
  Administered 2013-09-08 (×3): via INTRAVENOUS

## 2013-09-08 MED ORDER — DOCUSATE SODIUM 100 MG PO CAPS
100.0000 mg | ORAL_CAPSULE | Freq: Two times a day (BID) | ORAL | Status: DC
  Administered 2013-09-08 – 2013-09-11 (×8): 100 mg via ORAL
  Filled 2013-09-08 (×7): qty 1

## 2013-09-08 MED ORDER — SODIUM CHLORIDE 0.9 % IV SOLN
INTRAVENOUS | Status: DC | PRN
  Administered 2013-09-08: 12:00:00 via INTRAVENOUS

## 2013-09-08 MED ORDER — PHENYLEPHRINE HCL 10 MG/ML IJ SOLN
10.0000 mg | INTRAVENOUS | Status: DC | PRN
  Administered 2013-09-08: 50 ug/min via INTRAVENOUS

## 2013-09-08 MED ORDER — KETOROLAC TROMETHAMINE 30 MG/ML IJ SOLN
INTRAMUSCULAR | Status: AC
  Filled 2013-09-08: qty 1

## 2013-09-08 MED ORDER — SIMVASTATIN 20 MG PO TABS
20.0000 mg | ORAL_TABLET | Freq: Every day | ORAL | Status: DC
  Administered 2013-09-09 – 2013-09-11 (×3): 20 mg via ORAL
  Filled 2013-09-08 (×5): qty 1

## 2013-09-08 MED FILL — Heparin Sodium (Porcine) Inj 1000 Unit/ML: INTRAMUSCULAR | Qty: 30 | Status: AC

## 2013-09-08 MED FILL — Sodium Chloride IV Soln 0.9%: INTRAVENOUS | Qty: 1000 | Status: AC

## 2013-09-08 MED FILL — Sodium Chloride Irrigation Soln 0.9%: Qty: 3000 | Status: AC

## 2013-09-08 SURGICAL SUPPLY — 72 items
APL SKNCLS STERI-STRIP NONHPOA (GAUZE/BANDAGES/DRESSINGS) ×1
BAG DECANTER FOR FLEXI CONT (MISCELLANEOUS) ×2 IMPLANT
BENZOIN TINCTURE PRP APPL 2/3 (GAUZE/BANDAGES/DRESSINGS) ×2 IMPLANT
BLADE SURG ROTATE 9660 (MISCELLANEOUS) IMPLANT
BUR PRECISION FLUTE 5.0 (BURR) ×2 IMPLANT
CAGE CONCORDE BULLET 9X9X23 (Cage) ×8 IMPLANT
CAGE SPNL PRLL BLT NOSE 23X9X9 (Cage) ×4 IMPLANT
CANISTER SUCT 3000ML (MISCELLANEOUS) ×2 IMPLANT
CONT SPEC 4OZ CLIKSEAL STRL BL (MISCELLANEOUS) ×4 IMPLANT
COVER BACK TABLE 24X17X13 BIG (DRAPES) IMPLANT
COVER TABLE BACK 60X90 (DRAPES) ×2 IMPLANT
DRAPE C-ARM 42X72 X-RAY (DRAPES) ×4 IMPLANT
DRAPE LAPAROTOMY 100X72X124 (DRAPES) ×2 IMPLANT
DRAPE POUCH INSTRU U-SHP 10X18 (DRAPES) ×2 IMPLANT
DRAPE PROXIMA HALF (DRAPES) IMPLANT
DRAPE SURG 17X23 STRL (DRAPES) ×2 IMPLANT
DRESSING TELFA 8X3 (GAUZE/BANDAGES/DRESSINGS) ×2 IMPLANT
DURAPREP 26ML APPLICATOR (WOUND CARE) ×2 IMPLANT
ELECT REM PT RETURN 9FT ADLT (ELECTROSURGICAL) ×2
ELECTRODE REM PT RTRN 9FT ADLT (ELECTROSURGICAL) ×1 IMPLANT
EVACUATOR 1/8 PVC DRAIN (DRAIN) ×2 IMPLANT
GAUZE SPONGE 4X4 16PLY XRAY LF (GAUZE/BANDAGES/DRESSINGS) IMPLANT
GLOVE BIO SURGEON STRL SZ 6.5 (GLOVE) ×4 IMPLANT
GLOVE BIO SURGEON STRL SZ8 (GLOVE) ×2 IMPLANT
GLOVE BIOGEL PI IND STRL 8.5 (GLOVE) ×5 IMPLANT
GLOVE BIOGEL PI INDICATOR 8.5 (GLOVE) ×5
GLOVE ECLIPSE 7.5 STRL STRAW (GLOVE) ×4 IMPLANT
GLOVE EXAM NITRILE LRG STRL (GLOVE) IMPLANT
GLOVE EXAM NITRILE MD LF STRL (GLOVE) IMPLANT
GLOVE EXAM NITRILE XL STR (GLOVE) IMPLANT
GLOVE EXAM NITRILE XS STR PU (GLOVE) IMPLANT
GLOVE SURG SS PI 8.0 STRL IVOR (GLOVE) ×8 IMPLANT
GOWN BRE IMP SLV AUR LG STRL (GOWN DISPOSABLE) ×4 IMPLANT
GOWN BRE IMP SLV AUR XL STRL (GOWN DISPOSABLE) ×4 IMPLANT
GOWN STRL REIN 2XL LVL4 (GOWN DISPOSABLE) ×6 IMPLANT
KIT BASIN OR (CUSTOM PROCEDURE TRAY) ×2 IMPLANT
KIT ROOM TURNOVER OR (KITS) ×2 IMPLANT
MILL MEDIUM DISP (BLADE) ×4 IMPLANT
NEEDLE HYPO 22GX1.5 SAFETY (NEEDLE) ×4 IMPLANT
NS IRRIG 1000ML POUR BTL (IV SOLUTION) ×2 IMPLANT
PACK LAMINECTOMY NEURO (CUSTOM PROCEDURE TRAY) ×2 IMPLANT
PAD ARMBOARD 7.5X6 YLW CONV (MISCELLANEOUS) ×6 IMPLANT
PATTIES SURGICAL .5 X.5 (GAUZE/BANDAGES/DRESSINGS) IMPLANT
PATTIES SURGICAL .5 X1 (DISPOSABLE) ×2 IMPLANT
PATTIES SURGICAL .75X.75 (GAUZE/BANDAGES/DRESSINGS) ×2 IMPLANT
PATTIES SURGICAL 1X1 (DISPOSABLE) IMPLANT
PUTTY BONE DBX 5CC MIX (Putty) ×2 IMPLANT
ROD EXPEDIUM PREBENT 95MM (Rod) ×2 IMPLANT
ROD PRE BENT EXP 40MM (Rod) ×2 IMPLANT
SCREW EXPEDIUM POLYAXIAL 6X40 (Screw) ×2 IMPLANT
SCREW EXPEDIUM POLYAXIAL 6X45M (Screw) ×4 IMPLANT
SCREW EXPEDIUM POLYAXIAL 7X40M (Screw) ×2 IMPLANT
SCREW POLYAXIAL 7X45MM (Screw) ×6 IMPLANT
SCREW SET SINGLE INNER (Screw) ×16 IMPLANT
SHEET CONFORM 45LX20WX5H (Bone Implant) ×4 IMPLANT
SPONGE GAUZE 4X4 12PLY (GAUZE/BANDAGES/DRESSINGS) ×2 IMPLANT
SPONGE LAP 4X18 X RAY DECT (DISPOSABLE) IMPLANT
SPONGE SURGIFOAM ABS GEL 100 (HEMOSTASIS) IMPLANT
STRIP CLOSURE SKIN 1/2X4 (GAUZE/BANDAGES/DRESSINGS) ×2 IMPLANT
SUT VIC AB 0 CT1 18XCR BRD8 (SUTURE) ×2 IMPLANT
SUT VIC AB 0 CT1 8-18 (SUTURE) ×2
SUT VIC AB 2-0 CP2 18 (SUTURE) ×4 IMPLANT
SUT VIC AB 3-0 SH 8-18 (SUTURE) ×4 IMPLANT
SYR 20CC LL (SYRINGE) ×2 IMPLANT
SYR CONTROL 10ML LL (SYRINGE) IMPLANT
TAPE CLOTH SURG 4X10 WHT LF (GAUZE/BANDAGES/DRESSINGS) ×2 IMPLANT
TOWEL OR 17X24 6PK STRL BLUE (TOWEL DISPOSABLE) ×2 IMPLANT
TOWEL OR 17X26 10 PK STRL BLUE (TOWEL DISPOSABLE) ×2 IMPLANT
TRAP SPECIMEN MUCOUS 40CC (MISCELLANEOUS) ×2 IMPLANT
TRAY FOLEY CATH 14FRSI W/METER (CATHETERS) ×2 IMPLANT
TRAY FOLEY CATH 16FRSI W/METER (SET/KITS/TRAYS/PACK) ×2 IMPLANT
WATER STERILE IRR 1000ML POUR (IV SOLUTION) ×2 IMPLANT

## 2013-09-08 NOTE — Preoperative (Signed)
Beta Blockers   Reason not to administer Beta Blockers:Not Applicable 

## 2013-09-08 NOTE — H&P (Signed)
See H& P.

## 2013-09-08 NOTE — Progress Notes (Signed)
Dr. Gypsy Balsam at bedside for pts Rhythm, heart block and decreased BP, 4cc phenylephrine and 1/2 amp Atropine given, heartrhythm to SR, and BP up after meds.

## 2013-09-08 NOTE — Interval H&P Note (Signed)
History and Physical Interval Note:  09/08/2013 7:41 AM  Thomas Ferrell  has presented today for surgery, with the diagnosis of stenosis/spondylosis  The various methods of treatment have been discussed with the patient and family. After consideration of risks, benefits and other options for treatment, the patient has consented to  Procedure(s): POSTERIOR LUMBAR FUSION 2 WITH HARDWARE REMOVAL (N/A) as a surgical intervention .  The patient's history has been reviewed, patient examined, no change in status, stable for surgery.  I have reviewed the patient's chart and labs.  Questions were answered to the patient's satisfaction.     [name] R

## 2013-09-08 NOTE — Transfer of Care (Signed)
Immediate Anesthesia Transfer of Care Note  Patient: Thomas Ferrell  Procedure(s) Performed: Procedure(s) with comments: LUMBAR TWO-THREE, LUMBAR FOUR-FIVE POSTERIOR LUMBAR INTERBODY FUSION, REMOVAL OF LUMBAR THREE-FOUR SCREWS (N/A) - POSTERIOR LUMBAR FUSION 2 WITH HARDWARE REMOVAL  Patient Location: PACU  Anesthesia Type:General  Level of Consciousness: awake and alert   Airway & Oxygen Therapy: Patient Spontanous Breathing and Patient connected to nasal cannula oxygen  Post-op Assessment: Report given to PACU RN and Post -op Vital signs reviewed and stable  Post vital signs: Reviewed and stable  Complications: No apparent anesthesia complications

## 2013-09-08 NOTE — Anesthesia Preprocedure Evaluation (Addendum)
Anesthesia Evaluation  Patient identified by MRN, date of birth, ID band Patient awake    Reviewed: Allergy & Precautions, H&P , NPO status , Patient's Chart, lab work & pertinent test results  Airway Mallampati: I TM Distance: >3 FB Neck ROM: Full    Dental  (+) Edentulous Upper and Edentulous Lower   Pulmonary COPDformer smoker,  + rhonchi         Cardiovascular hypertension, Rhythm:Regular Rate:Normal     Neuro/Psych CVA    GI/Hepatic   Endo/Other    Renal/GU      Musculoskeletal   Abdominal   Peds  Hematology   Anesthesia Other Findings   Reproductive/Obstetrics                          Anesthesia Physical Anesthesia Plan  ASA: III  Anesthesia Plan: General   Post-op Pain Management:    Induction: Intravenous  Airway Management Planned: Oral ETT  Additional Equipment:   Intra-op Plan:   Post-operative Plan: Extubation in OR  Informed Consent: I have reviewed the patients History and Physical, chart, labs and discussed the procedure including the risks, benefits and alternatives for the proposed anesthesia with the patient or authorized representative who has indicated his/her understanding and acceptance.     Plan Discussed with: CRNA and Surgeon  Anesthesia Plan Comments:         Anesthesia Quick Evaluation

## 2013-09-08 NOTE — Anesthesia Procedure Notes (Signed)
Procedure Name: Intubation Date/Time: 09/08/2013 7:48 AM Performed by: Elon Alas Pre-anesthesia Checklist: Patient identified, Timeout performed, Emergency Drugs available, Suction available and Patient being monitored Patient Re-evaluated:Patient Re-evaluated prior to inductionOxygen Delivery Method: Circle system utilized Preoxygenation: Pre-oxygenation with 100% oxygen Intubation Type: IV induction Laryngoscope Size: Miller and 3 Grade View: Grade I Tube type: Oral Tube size: 7.5 mm Number of attempts: 1 Airway Equipment and Method: Stylet Placement Confirmation: positive ETCO2,  ETT inserted through vocal cords under direct vision and breath sounds checked- equal and bilateral Secured at: 22 cm Tube secured with: Tape Dental Injury: Teeth and Oropharynx as per pre-operative assessment

## 2013-09-08 NOTE — Progress Notes (Signed)
UR COMPLETED  

## 2013-09-08 NOTE — Anesthesia Postprocedure Evaluation (Signed)
  Anesthesia Post-op Note  Patient: Thomas Ferrell  Procedure(s) Performed: Procedure(s) with comments: LUMBAR TWO-THREE, LUMBAR FOUR-FIVE POSTERIOR LUMBAR INTERBODY FUSION, REMOVAL OF LUMBAR THREE-FOUR SCREWS (N/A) - POSTERIOR LUMBAR FUSION 2 WITH HARDWARE REMOVAL  Patient Location: PACU  Anesthesia Type:General  Level of Consciousness: awake and alert   Airway and Oxygen Therapy: Patient Spontanous Breathing  Post-op Pain: mild  Post-op Assessment: Post-op Vital signs reviewed, Patient's Cardiovascular Status Stable, Respiratory Function Stable, Patent Airway, No signs of Nausea or vomiting and Pain level controlled  Post-op Vital Signs: Reviewed and stable  Complications: No apparent anesthesia complications

## 2013-09-08 NOTE — Op Note (Signed)
09/08/2013  12:18 PM  PATIENT:  Thomas Ferrell  72 y.o. 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, instability  - prior surgery  POST-OPERATIVE DIAGNOSIS: same  PROCEDURE:  Procedure(s): Redo decompressive laminectomy decompressing L2, L3, L4, L5 roots (4levels) , PLIF L2-3 and L4-5 (2 levels),  Interbody cages L2-3 and L4-5 (2 levels) ,  Segmented pedicle screw fixation L2-5 with expedium screws , removal of prior pedicle screw fixation L3-4,    Autograft, allograft , bone marrow aspirate   SURGEON:  Surgeon(s): Clydene Fake, MD Maeola Harman, MD-assist    ANESTHESIA:   general  EBL:  Total I/O In: 2480 [I.V.:2350; Blood:130] Out: 585 [Urine:185; Blood:400]  BLOOD ADMINISTERED:130 CC CELLSAVER  DRAINS: hemovac   SPECIMEN:  No Specimen  DICTATION: Patient is a 72 year old gentleman who underwent compression and fusion of 34 and the surgery at the four-part the past 10 to over. Dominant starting to more severe back pain range was legs trouble walking has been progressive he was evaluated by neurology negative EMG nerve conduction velocities MRI was done showing stenosis above and below the prior fusion and x-ray was done showing some stability the 45 level patient had epidural injections which have not given any significant relief progressive symptoms and we discussed surgical options after much discussion patient to proceed with redo decompressive lamina fusion at above and below prior fusion.  Patient from the operative general anesthesia induced patient placed in prone position Armbrust frame all pressure points padded. Patient prepped draped sterile fashion 7 incision injected with 20 cc 1% lidocaine with epinephrine. Midline incision over the lumbar spine LMA incision taken the fascia hemostasis obtained with position fascia was incised and subperiosteal dissection done over the L1 through 5 spinous process lamina to the facets exposing the transverse  processes of L2-L3 4 and 5 roots exposed the pedicle screws and rods they were at 3-4.  We dissect through the previous scar from his previous surgery especially in the left side and the 34 and 4-5.   We placed something retractors and then using the new patient the screwdrivers were removed locking nuts and rods and the  pedicle screws at L 3 and 4.     We then did a redo decompressive lamina removing spinous process and lamina of 5 and canal 4 spinous process and lamina was done with Leksell rongeurs and Kerrison punches we carefully decompressed the central canal and medial facetectomies were actually were extended and medial to those decompress the lateral gutters and a good decompression over the L4 and L5 nerve roots bilaterally. We then explored the space good hemostasis with bipolar cauterization incised the displacement and started discectomy prepared the interbody space for interbody fusion using distractors scrapers broaches curettes and pituitary rongeurs. We distracted interspace up to 9 mm. We then packed 2 interbody cages with autograft bone all the bone that was removed during the laminectomy was cleaned from soft tissue chart the small pieces bone putty this and this and the spacers interbody cages. We packed the interspace with autograft bone mixture in the holding distraction left-sided tapped the cage into the contralateral side currently retracting the nerve roots and dura and remove the distractor ORIF of the interspace and then placed in a cage on each lateral side. Attention then taken to the to 3 area her into aggressive laminectomy done with Leksell rongeurs and Kerrison punches decompressing the central canal and getting good decompression of the L2 and L3 nerve roots bilaterally a mixture  of her foramen well medial facetectomy done bilaterally into this decompressed the lateral gutters along the nerve roots. We then explored the epidural space a hemostasis with bipolar cauterization  incised the space and then prepared the interbody space for interbody fusion pituitary rongeurs curettes distractors scrapers and broaches. Distracted interspace up to 9 mm. We then packed the interspace with autograft allograft bone mixture back to 9 mm interbody cages with this autograft allograft bone mixture and a tapped the cages into position on each side. Explored the thecal sac and the nerve root redo decompression the central canal and the L2 L3-L4 and L5 nerve roots bilaterally good position of her interbody cages at the to 3 and 45 levels. We. About solution. We then used high-speed drill to decorticate lateral facets and process from L2-5 using fluoroscopy and intraocular marks on the pedicle entry points at L2 is high-speed drill to decorticate placed a probe down the pedicle aspirated bone marrow aspirate to placed on and allograft sponge tapped the hole checked with small ball probe and sure he did bony circumference and placed Expedium pedicle screw is this is repeated on the opposite side at L2. We then reattempt a 6 mm tap and placed a millimeters screws into the previous screw holes at C. history of L3 and for culture fluoroscopy imaging and then the L5 these fluoroscopy and intralaminar to 100 point decorticated with high-speed drill placed a probe down the pedicle aspirated bone marrow aspirate to checked the hole make sure he did get good bony circumference tapped hole and placed expedient screws and L5 and we did this bilaterally. We then took final AP and lateral for imaging showing good position pedicle screws and right cages at all levels. We then placed a rod on the right side from the pedicle screws from the 2-5 placed locking caps and final tightened those on the left side we placed a small rod between the L2-3 the pedicle screws placed locking nuts and final tightened those then placed another small rod between L4 and 5 placed locking nuts and final tightened those. Then again. About  solution explore the dura was we did decompression central canal and good decompression of nerve roots bilaterally L2 L3-L4 and 5 we placed a Hemovac drain through separate stab incision and then retractors removed fascia closed with 0 Vicryl interrupted sutures subcutaneous tissue closed with 021 through Vicryl inverted interrupted sutures skin closed benzoin Steri-Strips dressing was placed patient placed back in spine position woken (and transferred recovery.  PLAN OF CARE: Admit to inpatient   PATIENT DISPOSITION:  PACU - hemodynamically stable.

## 2013-09-09 ENCOUNTER — Inpatient Hospital Stay (HOSPITAL_COMMUNITY): Payer: Medicare Other

## 2013-09-09 MED ORDER — GABAPENTIN 300 MG PO CAPS
300.0000 mg | ORAL_CAPSULE | Freq: Two times a day (BID) | ORAL | Status: DC
  Administered 2013-09-09 – 2013-09-11 (×6): 300 mg via ORAL
  Filled 2013-09-09 (×9): qty 1

## 2013-09-09 MED ORDER — DEXAMETHASONE SODIUM PHOSPHATE 4 MG/ML IJ SOLN
4.0000 mg | Freq: Four times a day (QID) | INTRAMUSCULAR | Status: AC
  Administered 2013-09-09 – 2013-09-10 (×6): 4 mg via INTRAVENOUS
  Filled 2013-09-09 (×5): qty 1

## 2013-09-09 MED ORDER — DEXAMETHASONE SODIUM PHOSPHATE 4 MG/ML IJ SOLN
6.0000 mg | Freq: Four times a day (QID) | INTRAMUSCULAR | Status: DC
  Administered 2013-09-09 (×2): 6 mg via INTRAVENOUS
  Filled 2013-09-09 (×2): qty 2
  Filled 2013-09-09: qty 1.5

## 2013-09-09 MED ORDER — MORPHINE SULFATE 2 MG/ML IJ SOLN: 2.0000 mg | INTRAMUSCULAR | Status: DC | PRN

## 2013-09-09 NOTE — Progress Notes (Signed)
OT NOTE  Pt currently reports seeing copper head snakes out of window on roof demonstrating hallucinations. Per patient wife works daily. Pt currently unsafe to d/c home alone without caregiver support. Recommend SNF short term placement.   Mateo Flow   OTR/L Pager: [phone] Office: [phone] .

## 2013-09-09 NOTE — Progress Notes (Signed)
Subjective: Patient reports Left foot numbness  Objective: Vital signs in last 24 hours: Temp:  [96.8 F (36 C)-98.6 F (37 C)] 98.6 F (37 C) (10/29 0546) Pulse Rate:  [34-132] 88 (10/29 0546) Resp:  [12-23] 18 (10/29 0546) BP: (71-146)/(43-97) 123/54 mmHg (10/29 0546) SpO2:  [2 %-100 %] 99 % (10/29 0546) FiO2 (%):  [93 %] 93 % (10/28 1600) Weight:  [50.349 kg (111 lb)] 50.349 kg (111 lb) (10/28 1415)  Intake/Output from previous day: 10/28 0701 - 10/29 0700 In: 2480 [I.V.:2350; Blood:130] Out: 2195 [Urine:1235; Drains:560; Blood:400] Intake/Output this shift: Total I/O In: 240 [P.O.:240] Out: -   Wound:c/d/i Decrease sensation Left L5 and Left S1 roots  -worse with LT but pain sensation present,   Toes down bilat  - DF and EHL and PF 4+to5-/5 on left, intact right  Lab Results: No results found for this basename: WBC, HGB, HCT, PLT,  in the last 72 hours BMET No results found for this basename: NA, K, CL, CO2, GLUCOSE, BUN, CREATININE, CALCIUM,  in the last 72 hours  Studies/Results: Dg Lumbar Spine 2-3 Views  09/08/2013   CLINICAL DATA:  Posterior fusion L2-L5. Removal of previous L3 through L4 hardware.  EXAM: LUMBAR SPINE - 2-3 VIEW; DG C-ARM 1-60 MIN  COMPARISON:  None.  FINDINGS: Two intraoperative spot images demonstrate pedicle screws from L2-L5. No hardware or bony complicating feature noted.  IMPRESSION: Posterior fusion L2-L5.   Electronically Signed   By: Charlett Nose M.D.   On: 09/08/2013 14:39   Dg C-arm 1-60 Min  09/08/2013   CLINICAL DATA:  Posterior fusion L2-L5. Removal of previous L3 through L4 hardware.  EXAM: LUMBAR SPINE - 2-3 VIEW; DG C-ARM 1-60 MIN  COMPARISON:  None.  FINDINGS: Two intraoperative spot images demonstrate pedicle screws from L2-L5. No hardware or bony complicating feature noted.  IMPRESSION: Posterior fusion L2-L5.   Electronically Signed   By: Charlett Nose M.D.   On: 09/08/2013 14:39    Assessment/Plan: Pt with decrease sensation  left foot  - will get CT L spine  - iv steroids  - increase activity with PT/OT   LOS: 1 day     Daire Okimoto R, MD 09/09/2013, 8:35 AM

## 2013-09-09 NOTE — Evaluation (Signed)
Physical Therapy Evaluation Patient Details Name: Thomas Ferrell MRN: [id number] DOB: 03-19-1941 Today's Date: 09/09/2013 Time: 4098-1191 PT Time Calculation (min): 26 min  PT Assessment / Plan / Recommendation History of Present Illness   Pt is a 72 y.o. Male s/p Redo decompressive laminectomy decompressing L2, L3, L4, L5 roots (4levels) , PLIF L2-3 and L4-5 (2 levels), Interbody cages L2-3 and L4-5 (2 levels) , Segmented pedicle screw fixation L2-5 with expedium screws , removal of prior pedicle screw fixation L3-4, Autograft, allograft , bone marrow aspirate      Clinical Impression  Patient demonstrates deficits in functional mobility as indicated below. Pt will benefit from continued skilled PT to address deficits and maximize function. Will see as indicated and progress activity as tolerated. Recommend ST SNF upon discharge given current deficits and decreased caregiver support as wife works full time.    PT Assessment  Patient needs continued PT services    Follow Up Recommendations  SNF       Barriers to Discharge Decreased caregiver support wife works full time    Equipment Recommendations  None recommended by PT    Recommendations for Other Services     Frequency Min 5X/week    Precautions / Restrictions Precautions Precautions: Back Precaution Booklet Issued: Yes (comment) Precaution Comments: educated on precautions Required Braces or Orthoses: Spinal Brace Restrictions Weight Bearing Restrictions: No   Pertinent Vitals/Pain NAD      Mobility  Bed Mobility Bed Mobility: Rolling Right;Rolling Left;Right Sidelying to Sit;Sitting - Scoot to [place] of Bed;Sit to Sidelying Right Rolling Right: 4: Min assist Rolling Left: 4: Min assist Right Sidelying to Sit: 4: Min assist Sitting - Scoot to Edge of Bed: 4: Min assist Sit to Sidelying Right: 3: Mod assist Details for Bed Mobility Assistance: Pt very anxious during mobility, able to perform with assist and  verbal cues Transfers Transfers: Sit to Stand;Stand to Sit Sit to Stand: 3: Mod assist Stand to Sit: 4: Min assist Details for Transfer Assistance: VCs for safe hand placement, assist to elevate and stabilize on LLE. Cues for controlled descent. Ambulation/Gait Ambulation/Gait Assistance: 4: Min assist Ambulation Distance (Feet): 5 Feet Assistive device: Rolling walker Ambulation/Gait Assistance Details: Max cues for LLE quad setting during stance.  Patient very anxious about ambulating secondary to decreased sensation. VCs tor upright posture Gait Pattern: Step-to pattern        PT Diagnosis: Difficulty walking;Abnormality of gait;Generalized weakness;Acute pain  PT Problem List: Decreased strength;Decreased range of motion;Decreased activity tolerance;Decreased balance;Decreased mobility;Decreased coordination;Decreased cognition;Decreased knowledge of use of DME;Pain PT Treatment Interventions: DME instruction;Gait training;Stair training;Functional mobility training;Therapeutic activities;Therapeutic exercise;Balance training;Patient/family education     PT Goals(Current goals can be found in the care plan section) Acute Rehab PT Goals Patient Stated Goal: to put on pants PT Goal Formulation: With patient Time For Goal Achievement: 09/23/13 Potential to Achieve Goals: Good  Visit Information  Last PT Received On: 09/09/13 Assistance Needed: +2       Prior Functioning  Home Living Family/patient expects to be discharged to:: Private residence Living Arrangements: Spouse/significant other Available Help at Discharge: Family (spouse works full time3) Type of Home: House Home Access: Stairs to enter Secretary/administrator of Steps: 1 Entrance Stairs-Rails: None Home Layout: One level Home Equipment: Environmental consultant - 2 wheels Prior Function Level of Independence: Independent Communication Communication: No difficulties Dominant Hand: Right    Cognition   Cognition Arousal/Alertness: Awake/alert Behavior During Therapy: Anxious;Impulsive Overall Cognitive Status: No family/caregiver present to determine baseline cognitive functioning  Area of Impairment: Safety/judgement;Awareness;Problem solving Memory: Decreased recall of precautions Safety/Judgement: Decreased awareness of safety;Decreased awareness of deficits Problem Solving: Requires verbal cues;Requires tactile cues    Extremity/Trunk Assessment Upper Extremity Assessment Upper Extremity Assessment: Defer to OT evaluation Lower Extremity Assessment Lower Extremity Assessment: LLE deficits/detail LLE Deficits / Details: diminished sensationweakness 3/5 with extension, dorsiflexion and plantar flexion LLE Sensation: decreased light touch;decreased proprioception (Sensation non-existant plantar foot, dorsal foot and medial lower leg.) LLE Coordination: decreased fine motor   Balance Dynamic Standing Balance Dynamic Standing - Balance Support: Bilateral upper extremity supported;During functional activity Dynamic Standing - Level of Assistance: 4: Min assist Dynamic Standing - Balance Activities: Lateral lean/weight shifting;Forward lean/weight shifting Dynamic Standing - Comments: weight shift activities as well as LB dressing. High Level Balance High Level Balance Activites: Side stepping;Backward walking High Level Balance Comments: mid/mod assist manual assist for stability, manual assist with RW, VCs for sequencing and technique, tactile cues for intiation  End of Session PT - End of Session Equipment Utilized During Treatment: Gait belt;Back brace Activity Tolerance: Patient limited by fatigue Patient left: in bed;with call bell/phone within reach Nurse Communication: Mobility status  GP     Fabio Asa 09/09/2013, 11:34 AM

## 2013-09-09 NOTE — Progress Notes (Signed)
Patient is having pain and lower back and also complaint of no feeling on left foot on assessment.  Dorsiflexion and plantar flexion are  Weak compare to Rt leg.  Patient also have tingling sensation from Left hip down to Left knee.  Dr. Venetia Maxon notified of the changes.  Iv decacron 6mg  Q6 hours x 3 doses ordered and to continue monitor his left foot.

## 2013-09-09 NOTE — Evaluation (Signed)
Occupational Therapy Evaluation Patient Details Name: Thomas Ferrell MRN: [id number] DOB: 1940-12-21 Today's Date: 09/09/2013 Time: 4098-1191 OT Time Calculation (min): 26 min  OT Assessment / Plan / Recommendation History of present illness  72 y.o. Male s/p Redo decompressive laminectomy decompressing L2, L3, L4, L5 roots (4levels) , PLIF L2-3 and L4-5 (2 levels), Interbody cages L2-3 and L4-5 (2 levels) , Segmented pedicle screw fixation L2-5 with expedium screws , removal of prior pedicle screw fixation L3-4, Autograft, allograft , bone marrow aspirate   Clinical Impression   Patient is s/p redo L2-5 laminectomy and PLIF L2-5 surgery resulting in functional limitations due to the deficits listed below (see OT problem list).  Patient will benefit from skilled OT acutely to increase independence and safety with ADLS to allow discharge SNF.     OT Assessment  Patient needs continued OT Services    Follow Up Recommendations  SNF;Supervision/Assistance - 24 hour    Barriers to Discharge      Equipment Recommendations  Other (comment) (defer to SNF)    Recommendations for Other Services    Frequency  Min 2X/week    Precautions / Restrictions Precautions Precautions: Back Precaution Booklet Issued: Yes (comment) Precaution Comments: educated on precautions and handout provide to patient in room Required Braces or Orthoses: Spinal Brace Spinal Brace: Lumbar corset;Applied in sitting position Restrictions Weight Bearing Restrictions: No   Pertinent Vitals/Pain Severe pain Reports decr sensation in lt LE    ADL  Eating/Feeding: Set up Where Assessed - Eating/Feeding: Bed level Grooming: Wash/dry hands;Wash/dry face;Set up Where Assessed - Grooming: Supported sitting Lower Body Dressing: +1 Total assistance Where Assessed - Lower Body Dressing: Supported sit to Pharmacist, hospital: Moderate assistance Toilet Transfer Method: Sit to stand Toilet Transfer Equipment: Raised  toilet seat with arms (or 3-in-1 over toilet) Toileting - Clothing Manipulation and Hygiene: +1 Total assistance Where Assessed - Toileting Clothing Manipulation and Hygiene: Sit to stand from 3-in-1 or toilet Equipment Used: Gait belt;Back brace;Rolling walker Transfers/Ambulation Related to ADLs: Pt completed bed mobility adn sit<> stand from bed x2. Pt attempting to ambulate motivated by showing therapist the snakes closer to the window.  ADL Comments: Pt observed calling out for the RN on arrival. pt unable to verbalize what he needed from RN. Pt anxious and made sure to tell therapist that he has all DME and braces he needed "do not order me anymore of that I have it" Pt educated pt on reason for therapist arrival. Pt then asking "are you taking me to the CT now?" pt educated again therapist purpose and confirmed that he will have a CT scan later today as ordered. Pt needed tactile input to sequence bed mobility. pt sitting eob and c/o spasms in left LE. Pt mod (A) to don brace. Pt attemptign to take brace off during session. pt requesting underwear and clothing. pt educated he can wear underwear  only due to pending scans. Pt asking x3 during session to put on clothes after education that he can not dress fully yet. Pt return to supine witih bed mobility.    OT Diagnosis: Generalized weakness;Acute pain;Cognitive deficits  OT Problem List: Decreased strength;Decreased activity tolerance;Impaired balance (sitting and/or standing);Decreased cognition;Decreased safety awareness;Decreased knowledge of use of DME or AE;Pain OT Treatment Interventions: Self-care/ADL training;DME and/or AE instruction;Therapeutic activities;Cognitive remediation/compensation;Patient/family education;Balance training   OT Goals(Current goals can be found in the care plan section) Acute Rehab OT Goals Patient Stated Goal: to put on pants OT Goal Formulation: With patient  Time For Goal Achievement: 09/23/13 Potential to  Achieve Goals: Good ADL Goals Pt Will Perform Grooming: with min assist;standing Pt Will Perform Upper Body Bathing: with min assist;standing Pt Will Perform Lower Body Bathing: with min assist;sit to/from stand Pt Will Transfer to Toilet: with min assist;bedside commode Additional ADL Goal #1: Pt will verbalize back precautions 3 out 3  Visit Information  Last OT Received On: 09/09/13 Assistance Needed: +2 PT/OT Co-Evaluation/Treatment: Yes History of Present Illness:  72 y.o. Male s/p Redo decompressive laminectomy decompressing L2, L3, L4, L5 roots (4levels) , PLIF L2-3 and L4-5 (2 levels), Interbody cages L2-3 and L4-5 (2 levels) , Segmented pedicle screw fixation L2-5 with expedium screws , removal of prior pedicle screw fixation L3-4, Autograft, allograft , bone marrow aspirate       Prior Functioning     Home Living Family/patient expects to be discharged to:: Private residence Living Arrangements: Spouse/significant other Available Help at Discharge: Family (spouse works full time3) Type of Home: House Home Access: Stairs to enter Secretary/administrator of Steps: 1 Entrance Stairs-Rails: None Home Layout: One level Home Equipment: Environmental consultant - 2 wheels Additional Comments: pt reports wife helps with adls at baseline  Prior Function Level of Independence: Needs assistance ADL's / Homemaking Assistance Needed: wife helps with shower transfer and bathing Communication Communication: No difficulties Dominant Hand: Right         Vision/Perception Vision - History Baseline Vision: Wears glasses all the time   Cognition  Cognition Arousal/Alertness: Awake/alert Behavior During Therapy: Anxious;Impulsive Overall Cognitive Status: No family/caregiver present to determine baseline cognitive functioning Area of Impairment: Safety/judgement;Awareness;Problem solving Memory: Decreased recall of precautions Safety/Judgement: Decreased awareness of safety;Decreased awareness  of deficits Awareness: Anticipatory;Emergent Problem Solving: Requires verbal cues;Requires tactile cues General Comments: pt hallunicating and reports seeeing snakes (cooper heads) on the roof of [facility]. pt states "i dont want to scare you but that's a snake out there" Pt educated no snake present and continues to tell next staff that a snake is on the roof. RN made aware.     Extremity/Trunk Assessment Upper Extremity Assessment Upper Extremity Assessment: Generalized weakness Lower Extremity Assessment Lower Extremity Assessment: Defer to PT evaluation  Cervical / Trunk Assessment Cervical / Trunk Assessment: Kyphotic     Mobility Bed Mobility Bed Mobility: Rolling Right;Rolling Left;Right Sidelying to Sit;Sitting - Scoot to [place] of Bed;Sit to Sidelying Right Rolling Right: 4: Min assist Rolling Left: 4: Min assist Right Sidelying to Sit: 4: Min assist Sitting - Scoot to Edge of Bed: 4: Min assist Sit to Sidelying Right: 3: Mod assist Details for Bed Mobility Assistance: Pt very anxious during mobility, able to perform with assist and verbal cues Transfers Sit to Stand: 3: Mod assist Stand to Sit: 4: Min assist Details for Transfer Assistance: VCs for safe hand placement, assist to elevate and stabilize on LLE. Cues for controlled descent.     Exercise     Balance Dynamic Standing Balance Dynamic Standing - Balance Support: Bilateral upper extremity supported;During functional activity Dynamic Standing - Level of Assistance: 4: Min assist Dynamic Standing - Balance Activities: Lateral lean/weight shifting;Forward lean/weight shifting Dynamic Standing - Comments: weight shift activities as well as LB dressing. High Level Balance High Level Balance Activites: Side stepping;Backward walking High Level Balance Comments: mid/mod assist manual assist for stability, manual assist with RW, VCs for sequencing and technique, tactile cues for intiation   End of Session OT - End of  Session Activity Tolerance: Patient limited by pain Patient left:  in bed;with call bell/phone within reach;with nursing/sitter in room Nurse Communication: Mobility status;Precautions  GO     Harolyn Rutherford 09/09/2013, 12:23 PM Pager: [phone]

## 2013-09-09 NOTE — Progress Notes (Addendum)
RN received a call from Dr. Phoebe Perch concerning pt CT-Spine; Radiology called and RN was informed radiology backed up d/t ER needing CT for Head and Trauma pts and will come and get pt as soon as available. MD office called with no response, message left for secretary to return RN call.   [id number] Secretary from Dr. Phoebe Perch office returns RN call back and informed of radiology response.

## 2013-09-09 NOTE — Progress Notes (Signed)
1900 RN noticed red blanchable spot on pt left buttock which is tender to touch. Skin intact and foam dsg placed on; pt turned on his right side. Incoming Rn notified.

## 2013-09-10 NOTE — Progress Notes (Signed)
Physical Therapy Treatment Patient Details Name: Thomas Ferrell MRN: [id number] DOB: [birth year] Today's Date: 09/10/2013 Time: 4098-1191 PT Time Calculation (min): 25 min  PT Assessment / Plan / Recommendation  History of Present Illness  72 y.o. Male s/p Redo decompressive laminectomy decompressing L2, L3, L4, L5 roots (4levels) , PLIF L2-3 and L4-5 (2 levels), Interbody cages L2-3 and L4-5 (2 levels) , Segmented pedicle screw fixation L2-5 with expedium screws , removal of prior pedicle screw fixation L3-4, Autograft, allograft , bone marrow aspirate   PT Comments   Pt progressing with ambulation today. Noted that pt was screaming throughout the morning but did not do so during tx.    Follow Up Recommendations  SNF     Does the patient have the potential to tolerate intense rehabilitation     Barriers to Discharge        Equipment Recommendations  None recommended by PT    Recommendations for Other Services    Frequency Min 5X/week   Progress towards PT Goals Progress towards PT goals: Progressing toward goals  Plan      Precautions / Restrictions Precautions Precautions: Back Precaution Booklet Issued: Yes (comment) Precaution Comments: pt was not able to recall precautions; pt was reeducated and handout was reviewed Required Braces or Orthoses: Spinal Brace Spinal Brace: Lumbar corset;Applied in sitting position Restrictions Weight Bearing Restrictions: No   Pertinent Vitals/Pain Patient reported 12/10 pain but premedicated and he agreed to PT.    Mobility  Bed Mobility Bed Mobility: Rolling Right;Right Sidelying to Sit;Sitting - Scoot to Edge of Bed Rolling Right: 5: Supervision Right Sidelying to Sit: 5: Supervision Sitting - Scoot to Edge of Bed: 4: Min guard Transfers Transfers: Sit to Stand;Stand to Sit Sit to Stand: 4: Min guard;From bed;With upper extremity assist Stand to Sit: 4: Min guard;Without upper extremity assist;To  chair/3-in-1 Ambulation/Gait Ambulation/Gait Assistance: 4: Min guard Ambulation Distance (Feet): 200 Feet Assistive device: Rolling walker Ambulation/Gait Assistance Details: Pt reported that he could not ambulate due to decreased sensation in L LE.  However he did well, requiring VC's for upright posture.  Pt seemed confident once he had ambulated outside of room. He required standing rest break after 150'. Gait Pattern: Step-through pattern;Decreased stride length;Shuffle Gait velocity: decreased Stairs: No Wheelchair Mobility Wheelchair Mobility: No    Exercises     PT Diagnosis:    PT Problem List:   PT Treatment Interventions:     PT Goals (current goals can now be found in the care plan section)    Visit Information  Last PT Received On: 09/10/13 Assistance Needed: +2 History of Present Illness:  72 y.o. Male s/p Redo decompressive laminectomy decompressing L2, L3, L4, L5 roots (4levels) , PLIF L2-3 and L4-5 (2 levels), Interbody cages L2-3 and L4-5 (2 levels) , Segmented pedicle screw fixation L2-5 with expedium screws , removal of prior pedicle screw fixation L3-4, Autograft, allograft , bone marrow aspirate    Subjective Data      Cognition  Cognition Arousal/Alertness: Awake/alert Behavior During Therapy: Anxious;Impulsive Overall Cognitive Status: No family/caregiver present to determine baseline cognitive functioning Area of Impairment: Safety/judgement;Awareness;Problem solving Memory: Decreased recall of precautions Safety/Judgement: Decreased awareness of safety;Decreased awareness of deficits Awareness: Anticipatory;Emergent Problem Solving: Requires verbal cues General Comments: Pt reported no hallucinations today     Balance     End of Session PT - End of Session Equipment Utilized During Treatment: Gait belt;Back brace Activity Tolerance: Patient tolerated treatment well Patient left: in chair;with call bell/phone  within reach Nurse Communication:  Mobility status;Other (comment) (Nursing notified that he was in chair)   GP     Junette Bernat, [place], SPTA 09/10/2013, 11:08 AM

## 2013-09-10 NOTE — Progress Notes (Signed)
Subjective: Patient reports minimal pain  - Left foot numbness may be slightly improved  Objective: Vital signs in last 24 hours: Temp:  [97.6 F (36.4 C)-98.7 F (37.1 C)] 97.6 F (36.4 C) (10/30 1000) Pulse Rate:  [75-89] 75 (10/30 1000) Resp:  [18-20] 18 (10/30 1000) BP: (121-146)/(56-68) 146/57 mmHg (10/30 1000) SpO2:  [90 %-96 %] 96 % (10/30 1000)  Intake/Output from previous day: 10/29 0701 - 10/30 0700 In: 480 [P.O.:480] Out: 1650 [Urine:1525; Drains:125] Intake/Output this shift: Total I/O In: 600 [P.O.:600] Out: 360 [Urine:300; Drains:60]  slight improvement in Left foot nambnwess  and definate improvement in strength  Lab Results: No results found for this basename: WBC, HGB, HCT, PLT,  in the last 72 hours BMET No results found for this basename: NA, K, CL, CO2, GLUCOSE, BUN, CREATININE, CALCIUM,  in the last 72 hours  Studies/Results: Ct Lumbar Spine Wo Contrast  09/09/2013   CLINICAL DATA:  Left foot numbness following surgery yesterday  EXAM: CT LUMBAR SPINE WITHOUT CONTRAST  TECHNIQUE: Multidetector CT imaging of the lumbar spine was performed without intravenous contrast administration. Multiplanar CT image reconstructions were also generated.  COMPARISON:  MRI lumbar spine 03/19/2013. Intraoperative radiographs 09/08/2013.  FINDINGS: The lumbar spine is imaged from the midbody of T12 through S2-3.  The previous L3-4 lumbar fusion is intact. Bridging bone is evident. The patient is now status post extension of the fusion to include L2-3 and L3-4. Pedicle screws are in place at both levels with connecting rods from L2 through L5.  Atherosclerotic calcifications are present within the abdominal aorta and branch vessels without aneurysm. There is mild fullness of the left adrenal gland, a stable finding. Limited imaging of the abdomen is otherwise unremarkable.  L1-2: Mild disc bulging is present. Mild foraminal narrowing is stable bilaterally.  L2-3 The patient is now  status post laminectomy. The central canal and foramina appear widely patent. The pedicle screws are in good position.  L3-4: The patient is status post laminectomy and fusion without evidence for residual or recurrent stenosis.  L4-5: The patient is status post laminectomy. The central canal and foramina appear widely patent. Fluid and gas is present around the dural sac, within normal limits following surgery. The pedicle screws at L4 and L5 are in excellent position.  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 Alignment is anatomic. There is no significant stenosis.  IMPRESSION: 1. Status post PLIF at L2-3 and L4-5 without evidence for residual or recurrent stenosis at either level. 2. The new pedicle screws are in excellent position without compromise of the nerve roots. 3. Mature PLIF at L3-4 without evidence for residual or recurrent stenosis. 4. And mild disk bulging and foraminal stenosis bilaterally at L1-2 is stable. 5. 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   Electronically Signed   By: Gennette Pac M.D.   On: 09/09/2013 20:34    Assessment/Plan: Increase activity  -   ? Home with HH/home PT in 2-3 days   LOS: 2 days     Clydene Fake, MD 09/10/2013, 1:54 PM

## 2013-09-10 NOTE — Progress Notes (Signed)
Seen and agreed 09/10/2013 Cleon Signorelli [place] PTA [phone] pager [phone] office    

## 2013-09-11 NOTE — Progress Notes (Signed)
Occupational Therapy Treatment Patient Details Name: Thomas Ferrell MRN: [id number] DOB: [birth year] Today's Date: 09/11/2013 Time: 4098-1191 OT Time Calculation (min): 29 min  OT Assessment / Plan / Recommendation  History of present illness  72 y.o. Male s/p Redo decompressive laminectomy decompressing L2, L3, L4, L5 roots (4levels) , PLIF L2-3 and L4-5 (2 levels), Interbody cages L2-3 and L4-5 (2 levels) , Segmented pedicle screw fixation L2-5 with expedium screws , removal of prior pedicle screw fixation L3-4, Autograft, allograft , bone marrow aspirate   OT comments  Pt progressing toward POC/goals related to ADL's and transfers. Pt able to state 3/3 back precautions w/ 1 vc noted. Pt does however, cont to require vc's for implementing back precautions during ADL tasks.   Follow Up Recommendations  SNF;Supervision/Assistance - 24 hour    Barriers to Discharge       Equipment Recommendations  None recommended by OT    Recommendations for Other Services    Frequency Min 2X/week   Progress towards OT Goals Progress towards OT goals: Progressing toward goals  Plan Discharge plan remains appropriate    Precautions / Restrictions Precautions Precautions: Back;Fall Precaution Comments: Pt recalls 3/3 back precautions w/ 1 verbal cue today. Handout reviewed. Required Braces or Orthoses: Spinal Brace Spinal Brace: Lumbar corset;Applied in sitting position Restrictions Weight Bearing Restrictions: No   Pertinent Vitals/Pain No c/o    ADL  Grooming: Performed;Wash/dry hands;Wash/dry face;Teeth care;Brushing hair;Supervision/safety (Standing at sink in bathroom) Where Assessed - Grooming: Supported standing;Unsupported standing Upper Body Bathing: Performed;Chest;Right arm;Left arm;Abdomen;Supervision/safety (Sitting at sink) Where Assessed - Upper Body Bathing: Unsupported sitting;Supported sitting Lower Body Bathing: Simulated;Moderate assistance Where Assessed - Lower Body  Bathing: Supported sit to stand Upper Body Dressing: Performed;Min guard;Supervision/safety (VC's for back precautions) Where Assessed - Upper Body Dressing: Unsupported sitting Lower Body Dressing: Performed;Maximal assistance Where Assessed - Lower Body Dressing: Supported sit to stand Toilet Transfer: Performed;Min guard Statistician Method: Sit to Barista: Comfort height toilet;Grab bars Where Assessed - Toileting Clothing Manipulation and Hygiene: Standing;Sit to stand from 3-in-1 or toilet Tub/Shower Transfer Method: Not assessed Equipment Used: Rolling walker;Other (comment) (3:1 at sink for sitting during ADL's) Transfers/Ambulation Related to ADLs: Pt up in chair upon OT arrival. Pt Supervision-min guard assist for sit to stand and transfers on/off toilet in bathroom as well as 3:1 while grooming at sink. VC's for safety and hand placement as well as back precautions. ADL Comments: Pt participated in treatment session today for bathing, toileting and functional mobility in room/bathroom. Pt able to state 3/3 back precautions given 1 vc today. He benefits from occasional reminders of back precautions during functional ADL's (bathing/dressing/toileting).     OT Diagnosis:    OT Problem List:   OT Treatment Interventions:     OT Goals(current goals can now be found in the care plan section) Acute Rehab OT Goals Patient Stated Goal: To go home w/ souse assist OT Goal Formulation: With patient Time For Goal Achievement: 09/23/13 Potential to Achieve Goals: Good  Visit Information  Last OT Received On: 09/11/13 Assistance Needed: +1 History of Present Illness:  72 y.o. Male s/p Redo decompressive laminectomy decompressing L2, L3, L4, L5 roots (4levels) , PLIF L2-3 and L4-5 (2 levels), Interbody cages L2-3 and L4-5 (2 levels) , Segmented pedicle screw fixation L2-5 with expedium screws , removal of prior pedicle screw fixation L3-4, Autograft, allograft , bone  marrow aspirate    Subjective Data      Prior Functioning  Cognition  Cognition Arousal/Alertness: Awake/alert Behavior During Therapy: Anxious;WFL for tasks assessed/performed Overall Cognitive Status: No family/caregiver present to determine baseline cognitive functioning Area of Impairment: Safety/judgement Memory: Decreased recall of precautions (Pt required 1 vc today for 3/3 back precautions) Safety/Judgement: Decreased awareness of deficits;Decreased awareness of safety Problem Solving: Requires verbal cues    Mobility  Bed Mobility Bed Mobility: Not assessed (Pt up in chair upon OT arrival) Transfers Transfers: Sit to Stand;Stand to Sit Sit to Stand: 4: Min guard;From chair/3-in-1;From toilet;With upper extremity assist;With armrests Stand to Sit: 4: Min guard;Without upper extremity assist;To chair/3-in-1;To toilet;With armrests Details for Transfer Assistance: VCs for safty, sequencing, hand placement, Cues for controlled descent.         Balance Balance Balance Assessed: Yes Static Sitting Balance Static Sitting - Balance Support: No upper extremity supported;Feet supported Static Standing Balance Static Standing - Balance Support: Left upper extremity supported;During functional activity Dynamic Standing Balance Dynamic Standing - Balance Support: During functional activity;No upper extremity supported Dynamic Standing - Level of Assistance: 5: Stand by assistance   End of Session OT - End of Session Equipment Utilized During Treatment: Rolling walker;Other (comment) (3:1 at sink, comfort height toilet during toileting transfers) Activity Tolerance: Patient tolerated treatment well Patient left: in chair;with call bell/phone within reach  GO     Alm Bustard 09/11/2013, 12:19 PM

## 2013-09-11 NOTE — Progress Notes (Signed)
Doing well. C/o appropriate incisional soreness. Left foot still with numbness  Temp:  [97.5 F (36.4 C)-98.2 F (36.8 C)] 98.2 F (36.8 C) (10/31 0839) Pulse Rate:  [61-88] 88 (10/31 0839) Resp:  [18-20] 18 (10/31 0839) BP: (124-146)/(54-86) 124/64 mmHg (10/31 0839) SpO2:  [93 %-98 %] 93 % (10/31 0839) No change  Incision CDI  Plan: Increase activity  - ? Home in am with HH& HHPT/OT

## 2013-09-11 NOTE — Progress Notes (Signed)
Physical Therapy Treatment Patient Details Name: Thomas Ferrell MRN: [id number] DOB: 1940/12/29 Today's Date: 09/11/2013 Time: 4098-1191 PT Time Calculation (min): 29 min  PT Assessment / Plan / Recommendation  History of Present Illness  72 y.o. Male s/p Redo decompressive laminectomy decompressing L2, L3, L4, L5 roots (4levels) , PLIF L2-3 and L4-5 (2 levels), Interbody cages L2-3 and L4-5 (2 levels) , Segmented pedicle screw fixation L2-5 with expedium screws , removal of prior pedicle screw fixation L3-4, Autograft, allograft , bone marrow aspirate   PT Comments   Patient progressing today with overall mobility. Continue to recommend SNF for continued rehab to regain functional independence prior to returning home  Follow Up Recommendations  SNF     Does the patient have the potential to tolerate intense rehabilitation     Barriers to Discharge        Equipment Recommendations  None recommended by PT    Recommendations for Other Services    Frequency Min 5X/week   Progress towards PT Goals Progress towards PT goals: Progressing toward goals  Plan Current plan remains appropriate    Precautions / Restrictions Precautions Precautions: Back;Fall Precaution Comments: Patient unable recall any precautions. Reeducated on all three Required Braces or Orthoses: Spinal Brace Spinal Brace: Lumbar corset;Applied in sitting position Restrictions Weight Bearing Restrictions: No   Pertinent Vitals/Pain 10/10 back pain. patient repositioned for comfort     Mobility  Bed Mobility Bed Mobility: Not assessed (Pt up in chair upon OT arrival) Rolling Right: 5: Supervision Rolling Left: 4: Min assist Right Sidelying to Sit: 5: Supervision Transfers Sit to Stand: 4: Min guard;From chair/3-in-1;From toilet;With upper extremity assist;With armrests Stand to Sit: 4: Min guard;Without upper extremity assist;To chair/3-in-1;To toilet;With armrests Details for Transfer Assistance: VCs for  safty, sequencing, hand placement, Cues for controlled descent. Ambulation/Gait Ambulation/Gait Assistance: 4: Min guard Ambulation Distance (Feet): 250 Feet Assistive device: Rolling walker Ambulation/Gait Assistance Details: Cues for upright posture and safety with RW Gait Pattern: Step-through pattern;Decreased stride length;Shuffle Gait velocity: decreased    Exercises     PT Diagnosis:    PT Problem List:   PT Treatment Interventions:     PT Goals (current goals can now be found in the care plan section) Acute Rehab PT Goals Patient Stated Goal: To go home w/ souse assist  Visit Information  Last PT Received On: 09/11/13 Assistance Needed: +1 History of Present Illness:  72 y.o. Male s/p Redo decompressive laminectomy decompressing L2, L3, L4, L5 roots (4levels) , PLIF L2-3 and L4-5 (2 levels), Interbody cages L2-3 and L4-5 (2 levels) , Segmented pedicle screw fixation L2-5 with expedium screws , removal of prior pedicle screw fixation L3-4, Autograft, allograft , bone marrow aspirate    Subjective Data  Patient Stated Goal: To go home w/ souse assist   Cognition  Cognition Arousal/Alertness: Awake/alert Behavior During Therapy: Anxious;WFL for tasks assessed/performed Overall Cognitive Status: No family/caregiver present to determine baseline cognitive functioning Area of Impairment: Safety/judgement Memory: Decreased recall of precautions Safety/Judgement: Decreased awareness of deficits;Decreased awareness of safety Problem Solving: Requires verbal cues    Balance  Balance Balance Assessed: Yes Static Sitting Balance Static Sitting - Balance Support: No upper extremity supported;Feet supported Static Standing Balance Static Standing - Balance Support: Left upper extremity supported;During functional activity Dynamic Standing Balance Dynamic Standing - Balance Support: During functional activity;No upper extremity supported Dynamic Standing - Level of Assistance:  5: Stand by assistance  End of Session PT - End of Session Equipment Utilized During  Treatment: Gait belt;Back brace Activity Tolerance: Patient tolerated treatment well Patient left: in chair;with call bell/phone within reach Nurse Communication: Mobility status;Other (comment)   GP     Robinette, Adline Potter 09/11/2013, 1:48 PM  09/11/2013 Fredrich Birks PTA [phone] pager [phone] office

## 2013-09-12 MED ORDER — GABAPENTIN 300 MG PO CAPS: 300.0000 mg | ORAL_CAPSULE | Freq: Two times a day (BID) | ORAL | Status: DC

## 2013-09-12 MED ORDER — OXYCODONE-ACETAMINOPHEN 5-325 MG PO TABS: 1.0000 | ORAL_TABLET | ORAL | Status: DC | PRN

## 2013-09-12 MED ORDER — CYCLOBENZAPRINE HCL 10 MG PO TABS: 10.0000 mg | ORAL_TABLET | Freq: Three times a day (TID) | ORAL | Status: DC | PRN

## 2013-09-12 NOTE — Discharge Summary (Signed)
Physician Discharge Summary  Patient ID: Thomas Ferrell MRN: [id number] DOB/AGE: [birth year] 72 y.o.  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 *   Discharged Condition: good  Hospital Course: pt admitted on day of surgery  - underwent procedure below  - post op pt had numbness of left foot  - CT lumbar spine was done showing good decompression, good position of instrumentation  - pt has made some slight improvement in thais  - meanwhile hw has had much less leg and back pain  - he has been up ambulating   - PT/OY recommended SNF, but pt refuses  - will d/hc home with [facility] PT/OT  Consults: None  Significant Diagnostic Studies:CT  Treatments: surgery: Redo decompressive laminectomy decompressing L2, L3, L4, L5 roots (4levels) , PLIF L2-3 and L4-5 (2 levels), Interbody cages L2-3 and L4-5 (2 levels) , Segmented pedicle screw fixation L2-5 with expedium screws , removal of prior pedicle screw fixation L3-4, Autograft, allograft , bone marrow aspirate   Discharge Exam: Blood pressure 120/51, pulse 72, temperature 98.5 F (36.9 C), temperature source Oral, resp. rate 20, height 5\' 4"  (1.626 m), weight 50.349 kg (111 lb), SpO2 95.00%. Wound:c/d/i Motor - 5/5 all but slight apraxia with Left foot movements  - has numbness in left L5 and S1 distributions  Disposition: home  Discharge Orders   Future Orders Complete By Expires   Face-to-face encounter (required for Medicare/Medicaid patients)  As directed    Comments:     I Thomas Ferrell R certify that this patient is under my care and that I, or a nurse practitioner or physician's assistant working with me, had a face-to-face encounter that meets the physician face-to-face encounter requirements with this patient on  09/12/2013. The encounter with the patient was in whole, or in part for the following medical condition(s) which is the primary reason for home health care (List medical condition): lumbar stenosis   Questions:     The encounter with the patient was in whole, or in part, for the following medical condition, which is the primary reason for home health care:  lumbar stenosis   I certify that, based on my findings, the following services are medically necessary home health services:  Physical therapy   Nursing   My clinical findings support the need for the above services:  Unsafe ambulation due to balance issues   Further, I certify that my clinical findings support that this patient is homebound due to:  Unable to leave home safely without assistance   Reason for Medically Necessary Home Health Services:  Therapy- Investment banker, operational, Patent examiner   Home Health  As directed    Questions:     To provide the following care/treatments:  PT   OT   RN   Home Health Aide       Medication List    STOP taking these medications       diclofenac 75 MG EC tablet  Commonly known as:  VOLTAREN      TAKE these medications       acetaminophen 500 MG tablet  Commonly known as:  TYLENOL  Take 1,000 mg by mouth every 6 (six) hours as needed for pain.     amLODipine 5 MG tablet  Commonly known as:  NORVASC  Take 5 mg by mouth  daily.     cyclobenzaprine 10 MG tablet  Commonly known as:  FLEXERIL  Take 1 tablet (10 mg total) by mouth 3 (three) times daily as needed for muscle spasms.     gabapentin 300 MG capsule  Commonly known as:  NEURONTIN  Take 1 capsule (300 mg total) by mouth 2 (two) times daily.     levofloxacin 750 MG tablet  Commonly known as:  LEVAQUIN  Take 750 mg by mouth daily. Takes for 7 days.  Start date 08/26/2013.     lisinopril 20 MG tablet  Commonly known as:  PRINIVIL,ZESTRIL  Take 20 mg by mouth daily.     oxyCODONE-acetaminophen 5-325 MG per  tablet  Commonly known as:  PERCOCET/ROXICET  Take 1-2 tablets by mouth every 4 (four) hours as needed for pain.     simvastatin 20 MG tablet  Commonly known as:  ZOCOR  Take 20 mg by mouth daily.         SignedClydene Fake, MD 09/12/2013, 9:35 AM

## 2013-09-14 ENCOUNTER — Encounter (HOSPITAL_COMMUNITY): Payer: Self-pay | Admitting: Emergency Medicine

## 2013-09-14 ENCOUNTER — Emergency Department (HOSPITAL_COMMUNITY): Payer: Medicare Other

## 2013-09-14 ENCOUNTER — Emergency Department (HOSPITAL_COMMUNITY)
Admission: EM | Admit: 2013-09-14 | Discharge: 2013-09-14 | Disposition: A | Discharge status: Home / Self Care | Payer: Medicare Other | Attending: Emergency Medicine | Admitting: Emergency Medicine

## 2013-09-14 DIAGNOSIS — Z8551 Personal history of malignant neoplasm of bladder: Secondary | ICD-10-CM | POA: Diagnosis not present

## 2013-09-14 DIAGNOSIS — E785 Hyperlipidemia, unspecified: Secondary | ICD-10-CM | POA: Insufficient documentation

## 2013-09-14 DIAGNOSIS — Z87891 Personal history of nicotine dependence: Secondary | ICD-10-CM | POA: Insufficient documentation

## 2013-09-14 DIAGNOSIS — R159 Full incontinence of feces: Secondary | ICD-10-CM | POA: Diagnosis not present

## 2013-09-14 DIAGNOSIS — M549 Dorsalgia, unspecified: Secondary | ICD-10-CM | POA: Diagnosis not present

## 2013-09-14 DIAGNOSIS — Z9889 Other specified postprocedural states: Secondary | ICD-10-CM | POA: Diagnosis not present

## 2013-09-14 DIAGNOSIS — Z8673 Personal history of transient ischemic attack (TIA), and cerebral infarction without residual deficits: Secondary | ICD-10-CM | POA: Insufficient documentation

## 2013-09-14 DIAGNOSIS — R209 Unspecified disturbances of skin sensation: Secondary | ICD-10-CM | POA: Diagnosis not present

## 2013-09-14 DIAGNOSIS — Z79899 Other long term (current) drug therapy: Secondary | ICD-10-CM | POA: Diagnosis not present

## 2013-09-14 DIAGNOSIS — R404 Transient alteration of awareness: Secondary | ICD-10-CM | POA: Diagnosis not present

## 2013-09-14 DIAGNOSIS — Z8739 Personal history of other diseases of the musculoskeletal system and connective tissue: Secondary | ICD-10-CM | POA: Insufficient documentation

## 2013-09-14 DIAGNOSIS — M539 Dorsopathy, unspecified: Secondary | ICD-10-CM | POA: Diagnosis not present

## 2013-09-14 DIAGNOSIS — R5381 Other malaise: Secondary | ICD-10-CM | POA: Diagnosis not present

## 2013-09-14 DIAGNOSIS — J449 Chronic obstructive pulmonary disease, unspecified: Secondary | ICD-10-CM | POA: Diagnosis not present

## 2013-09-14 DIAGNOSIS — I10 Essential (primary) hypertension: Secondary | ICD-10-CM | POA: Insufficient documentation

## 2013-09-14 DIAGNOSIS — J4489 Other specified chronic obstructive pulmonary disease: Secondary | ICD-10-CM | POA: Insufficient documentation

## 2013-09-14 DIAGNOSIS — R200 Anesthesia of skin: Secondary | ICD-10-CM

## 2013-09-14 LAB — BASIC METABOLIC PANEL
CO2: 25 mEq/L (ref 19–32)
Calcium: 8.8 mg/dL (ref 8.4–10.5)
Creatinine, Ser: 0.79 mg/dL (ref 0.50–1.35)
GFR calc Af Amer: >90 mL/min (ref >90)
GFR calc non Af Amer: 88 mL/min — ABNORMAL LOW (ref >90)
Potassium: 3.2 mEq/L — ABNORMAL LOW (ref 3.5–5.1)
Sodium: 132 mEq/L — ABNORMAL LOW (ref 135–145)

## 2013-09-14 LAB — CBC
HCT: 28.4 % — ABNORMAL LOW (ref 39.0–52.0)
Hemoglobin: 9.9 g/dL — ABNORMAL LOW (ref 13.0–17.0)
MCH: 33.4 pg (ref 26.0–34.0)
MCHC: 34.9 g/dL (ref 30.0–36.0)
MCV: 95.9 fL (ref 78.0–100.0)
Platelets: 314 K/uL (ref 150–400)
RBC: 2.96 MIL/uL — ABNORMAL LOW (ref 4.22–5.81)
RDW: 12.8 % (ref 11.5–15.5)
WBC: 17.3 K/uL — ABNORMAL HIGH (ref 4.0–10.5)

## 2013-09-14 MED ORDER — OXYCODONE-ACETAMINOPHEN 5-325 MG PO TABS
2.0000 | ORAL_TABLET | Freq: Once | ORAL | Status: AC
  Administered 2013-09-14: 2 via ORAL
  Filled 2013-09-14: qty 2

## 2013-09-14 MED ORDER — POTASSIUM CHLORIDE CRYS ER 20 MEQ PO TBCR
40.0000 meq | EXTENDED_RELEASE_TABLET | Freq: Once | ORAL | Status: AC
  Administered 2013-09-14: 40 meq via ORAL
  Filled 2013-09-14: qty 2

## 2013-09-14 NOTE — ED Notes (Signed)
Post void residual per bladder scan, avg 80 ml x 3 scans

## 2013-09-14 NOTE — ED Provider Notes (Signed)
Care transferred to me. Patient presents with numbness a few days after his back surgery. Waiting on MRI.  MRI shows no epidural hematoma, fluid collection or other acute pathology. Reviewed MRI with his neurosurgeon Dr. Phoebe Perch, who states it's essentially normal post op. He feels the little superficial fluid collection is a seroma and the elevated WBC is post-op. Recommends a post-void residual (80 cc) to eval for this urinary incontinence. Left leg numnbess is not worse since the surgery. On further review he states his fecal incontinence is more hard to control diarrhea. Phoebe Perch will f/u in clinic, recommends increasing neurontin from BID to TID if patient feels his pain isn't being controlled.  MR Lumbar Spine Wo Contrast (Final result)  Result time: 09/14/13 09:01:15    Procedure changed from MR Lumbar Spine W Contrast       Final result by Rad Results In Interface (09/14/13 09:01:15)    Narrative:   CLINICAL DATA: Continued low back pain. Left leg pain and numbness.  EXAM: MRI LUMBAR SPINE WITHOUT CONTRAST  TECHNIQUE: Multiplanar, multisequence MR imaging was performed. No intravenous contrast was administered.  COMPARISON: CT 09/09/2013. Preoperative MRI 03/19/2013.  FINDINGS: Numbering convention used on prior exam preserved. The patient was only able the complete axial T2 weighted imaging in addition to the sagittal imaging. The study is mildly motion degraded. Mild levoconvex curvature of the lumbar spine is present with the apex at L2. Posterior rod and pedicle screw fixation extends from L2 through L5. Spinal cord terminates dorsal to the T12 vertebra. Partial visualization of T10-T11 shows a shallow disc bulge without stenosis. The T11-T12 and T12-L1 disks appear within normal limits. Left renal cyst is partially visualized. Urinary bladder is distended.  L1-L2: Mild disc desiccation. There is shallow circumferential disc bulging with mild symmetric bilateral foraminal  stenosis. Minimal loss of disc height.  L2-L3: Discectomy with interbody bone graft. Posterior decompression. No recurrent stenosis.  L3-L4: Discectomy with interbody bone graft. Posterior decompression. No recurrent stenosis.  L4-L5: Discectomy with interbody bone graft. Posterior decompression. No recurrent stenosis.  L5-S1: Normal disk. No stenosis.  No significant fluid collections are identified in the paraspinal soft tissues. Tiny amount of fluid is present in the laminectomy bed at L3-L4, likely representing a tiny postoperative seroma. Tiny subcutaneous fluid collection dorsal do L4 also likely represents seroma. Infection is impossible to exclude on imaging but not favored. No evidence of spinal leak.  There is faint apparent edema at adjacent to the right sacroiliac joint which is only partially visualized.  IMPRESSION: 1. Postoperative changes of posterior lumbar interbody fusion from L2 through L5 without complicating features or recurrent stenosis. No interval change from recent CT. 2. Faint bone marrow edema in the right sacral ala suspicious for insufficiency fracture. Sclerosis was present on prior CT in this region, suggesting some chronicity.      Audree Camel, MD 09/14/13 1102

## 2013-09-14 NOTE — ED Notes (Signed)
Patient is resting comfortably. 

## 2013-09-14 NOTE — ED Notes (Signed)
Patient in mri at this time

## 2013-09-14 NOTE — ED Notes (Signed)
Pt voided X 1 in bedside urinal

## 2013-09-14 NOTE — ED Notes (Signed)
Pt states he got discharged for back surgery to relieve L leg pain and numbness, states he woke up with more numbness from knee down and lost control of bowels. Per ems pt was able to walk with walker to stretcher.

## 2013-09-14 NOTE — ED Notes (Signed)
Per EMS- patient had back surgery recently for numbness to L leg. Went home and regained numbness from L knee down, came in tonight for loss of control to bowels and numbness.

## 2013-09-14 NOTE — ED Notes (Signed)
Per MD, patient needs MRI, but does not require emergent diagnostics at this time. Patient to wait in department until MRI is available in AM.

## 2013-09-14 NOTE — ED Provider Notes (Signed)
CSN: [id number]     Arrival date & time 09/14/13  0224 History   First MD Initiated Contact with Patient 09/14/13 0256     Chief Complaint  Patient presents with  . Numbness    L leg   (Consider location/radiation/quality/duration/timing/severity/associated sxs/prior Treatment) HPI Comments: Recent lumbar fusion surgery for L leg numbness 5 days ago. Discharged yesterday. Patient with worsening numbness today. Worsening pain today also. Patient had episode of bladder and bowel incontinence.   Patient is a 72 y.o. male presenting with neurologic complaint. The history is provided by the patient.  Neurologic Problem This is a recurrent problem. The current episode started yesterday. The problem occurs constantly. The problem has been gradually worsening. Pertinent negatives include no abdominal pain, no headaches and no shortness of breath. Nothing aggravates the symptoms. Nothing relieves the symptoms.    Past Medical History  Diagnosis Date  . Hypertension   . Dyslipidemia   . Cerebrovascular disease     right brain CVA 2007  . Lumbago   . Abnormality of gait 03/03/2013  . CVA (cerebral vascular accident) 2007    r-cva  . COPD (chronic obstructive pulmonary disease)   . Cough productive of clear sputum     TX RECENT SINUS INFECTION   . bladder ca dx'd 11/2009    chemo/xrt comp 02/2010   Past Surgical History  Procedure Laterality Date  . Carotid artery angioplasty  2009  . Back surgery  2010  . Bladder cancer    . Laminectomy  09/08/2013    L 2 L3 L4 L5       Dr Phoebe Perch   Family History  Problem Relation Age of Onset  . Stroke Mother   . Cancer Father   . Dementia Father   . Cancer Sister    History  Substance Use Topics  . Smoking status: Former Smoker    Types: Cigarettes    Quit date: 11/12/2005  . Smokeless tobacco: Never Used  . Alcohol Use: Yes     Comment: Three cans of beer daily    Review of Systems  Constitutional: Negative for fever.  Respiratory:  Negative for cough and shortness of breath.   Gastrointestinal: Negative for vomiting and abdominal pain.  Neurological: Negative for headaches.  All other systems reviewed and are negative.    Allergies  Morphine and related  Home Medications   Current Outpatient Rx  Name  Route  Sig  Dispense  Refill  . acetaminophen (TYLENOL) 500 MG tablet   Oral   Take 1,000 mg by mouth every 6 (six) hours as needed for pain.         Marland Kitchen amLODipine (NORVASC) 5 MG tablet   Oral   Take 5 mg by mouth daily.         . cyclobenzaprine (FLEXERIL) 10 MG tablet   Oral   Take 1 tablet (10 mg total) by mouth 3 (three) times daily as needed for muscle spasms.   50 tablet   3   . gabapentin (NEURONTIN) 300 MG capsule   Oral   Take 1 capsule (300 mg total) by mouth 2 (two) times daily.   60 capsule   5   . lisinopril (PRINIVIL,ZESTRIL) 20 MG tablet   Oral   Take 20 mg by mouth daily.         Marland Kitchen oxyCODONE-acetaminophen (PERCOCET/ROXICET) 5-325 MG per tablet   Oral   Take 1-2 tablets by mouth every 4 (four) hours as needed for pain.  51 tablet   0   . simvastatin (ZOCOR) 20 MG tablet   Oral   Take 20 mg by mouth daily.          BP 134/61  Pulse 78  Temp(Src) 98.8 F (37.1 C) (Oral)  Resp 20  SpO2 96% Physical Exam  Nursing note and vitals reviewed. Constitutional: He is oriented to person, place, and time. He appears well-developed and well-nourished. No distress.  HENT:  Head: Normocephalic and atraumatic.  Mouth/Throat: No oropharyngeal exudate.  Eyes: EOM are normal. Pupils are equal, round, and reactive to light.  Neck: Normal range of motion. Neck supple.  Cardiovascular: Normal rate and regular rhythm.  Exam reveals no friction rub.   No murmur heard. Pulmonary/Chest: Effort normal and breath sounds normal. No respiratory distress. He has no wheezes. He has no rales.  Abdominal: He exhibits no distension. There is no tenderness. There is no rebound.  Genitourinary:  Rectal exam shows anal tone normal.  No saddle anesthesia  Musculoskeletal: Normal range of motion. He exhibits no edema.       Back:  Neurological: He is alert and oriented to person, place, and time. No cranial nerve deficit. He exhibits normal muscle tone. Coordination normal.  Skin: No rash noted. He is not diaphoretic.    ED Course  Procedures (including critical care time) Labs Review Labs Reviewed  CBC - Abnormal; Notable for the following:    WBC 17.3 (*)    RBC 2.96 (*)    Hemoglobin 9.9 (*)    HCT 28.4 (*)    All other components within normal limits  BASIC METABOLIC PANEL - Abnormal; Notable for the following:    Sodium 132 (*)    Potassium 3.2 (*)    Chloride 93 (*)    GFR calc non Af Amer 88 (*)    All other components within normal limits   Imaging Review No results found.  EKG Interpretation   None       MDM   1. Numbness    52M presents with left leg numbness, balance, urinary incontinence. He had recent surgery 5 days ago for a lumbar fusion. Released yesterday. Since release, left leg numbness and pain is worsened. This was persistent post surgery. Patient had episode of urine and fecal incontinence night with his wife. He denies any history of this. Patient was on aspirin prior surgery but stopped before surgery. He is not on aspirin since surgery. Here, he has mild strength in his left leg. He has numbness in the left leg, however likely this is postoperative. He can wiggle his toes slightly. He has no saddle anesthesia. Normal rectal tone. I spoke with Dr. Yetta Barre, neurosurgery, who recommended MRI. We can await for MRI to get here in 3 hours instead of calling them in urgently per Dr. Yetta Barre. Care to Dr. Criss Alvine.   Dagmar Hait, MD 09/16/13 [id number]

## 2013-09-25 DIAGNOSIS — T84498A Other mechanical complication of other internal orthopedic devices, implants and grafts, initial encounter: Secondary | ICD-10-CM | POA: Diagnosis not present

## 2013-09-25 DIAGNOSIS — I10 Essential (primary) hypertension: Secondary | ICD-10-CM | POA: Diagnosis not present

## 2013-09-25 DIAGNOSIS — M545 Low back pain: Secondary | ICD-10-CM | POA: Diagnosis not present

## 2013-09-25 DIAGNOSIS — Z4801 Encounter for change or removal of surgical wound dressing: Secondary | ICD-10-CM | POA: Diagnosis not present

## 2013-09-26 DIAGNOSIS — Z4801 Encounter for change or removal of surgical wound dressing: Secondary | ICD-10-CM | POA: Diagnosis not present

## 2013-09-26 DIAGNOSIS — I10 Essential (primary) hypertension: Secondary | ICD-10-CM | POA: Diagnosis not present

## 2013-09-26 DIAGNOSIS — M545 Low back pain: Secondary | ICD-10-CM | POA: Diagnosis not present

## 2013-09-26 DIAGNOSIS — T84498A Other mechanical complication of other internal orthopedic devices, implants and grafts, initial encounter: Secondary | ICD-10-CM | POA: Diagnosis not present

## 2013-09-28 DIAGNOSIS — Z4801 Encounter for change or removal of surgical wound dressing: Secondary | ICD-10-CM | POA: Diagnosis not present

## 2013-09-28 DIAGNOSIS — M545 Low back pain: Secondary | ICD-10-CM | POA: Diagnosis not present

## 2013-09-28 DIAGNOSIS — T84498A Other mechanical complication of other internal orthopedic devices, implants and grafts, initial encounter: Secondary | ICD-10-CM | POA: Diagnosis not present

## 2013-09-28 DIAGNOSIS — I10 Essential (primary) hypertension: Secondary | ICD-10-CM | POA: Diagnosis not present

## 2013-09-30 DIAGNOSIS — Z4801 Encounter for change or removal of surgical wound dressing: Secondary | ICD-10-CM | POA: Diagnosis not present

## 2013-09-30 DIAGNOSIS — I10 Essential (primary) hypertension: Secondary | ICD-10-CM | POA: Diagnosis not present

## 2013-09-30 DIAGNOSIS — M545 Low back pain: Secondary | ICD-10-CM | POA: Diagnosis not present

## 2013-09-30 DIAGNOSIS — T84498A Other mechanical complication of other internal orthopedic devices, implants and grafts, initial encounter: Secondary | ICD-10-CM | POA: Diagnosis not present

## 2013-10-01 DIAGNOSIS — M545 Low back pain: Secondary | ICD-10-CM | POA: Diagnosis not present

## 2013-10-01 DIAGNOSIS — I10 Essential (primary) hypertension: Secondary | ICD-10-CM | POA: Diagnosis not present

## 2013-10-01 DIAGNOSIS — Z4801 Encounter for change or removal of surgical wound dressing: Secondary | ICD-10-CM | POA: Diagnosis not present

## 2013-10-01 DIAGNOSIS — T84498A Other mechanical complication of other internal orthopedic devices, implants and grafts, initial encounter: Secondary | ICD-10-CM | POA: Diagnosis not present

## 2013-10-02 DIAGNOSIS — T84498A Other mechanical complication of other internal orthopedic devices, implants and grafts, initial encounter: Secondary | ICD-10-CM | POA: Diagnosis not present

## 2013-10-02 DIAGNOSIS — Z4801 Encounter for change or removal of surgical wound dressing: Secondary | ICD-10-CM | POA: Diagnosis not present

## 2013-10-02 DIAGNOSIS — M545 Low back pain: Secondary | ICD-10-CM | POA: Diagnosis not present

## 2013-10-02 DIAGNOSIS — I10 Essential (primary) hypertension: Secondary | ICD-10-CM | POA: Diagnosis not present

## 2013-10-05 DIAGNOSIS — M545 Low back pain: Secondary | ICD-10-CM | POA: Diagnosis not present

## 2013-10-05 DIAGNOSIS — I10 Essential (primary) hypertension: Secondary | ICD-10-CM | POA: Diagnosis not present

## 2013-10-05 DIAGNOSIS — T84498A Other mechanical complication of other internal orthopedic devices, implants and grafts, initial encounter: Secondary | ICD-10-CM | POA: Diagnosis not present

## 2013-10-05 DIAGNOSIS — Z4801 Encounter for change or removal of surgical wound dressing: Secondary | ICD-10-CM | POA: Diagnosis not present

## 2013-10-07 DIAGNOSIS — M545 Low back pain: Secondary | ICD-10-CM | POA: Diagnosis not present

## 2013-10-07 DIAGNOSIS — I10 Essential (primary) hypertension: Secondary | ICD-10-CM | POA: Diagnosis not present

## 2013-10-07 DIAGNOSIS — T84498A Other mechanical complication of other internal orthopedic devices, implants and grafts, initial encounter: Secondary | ICD-10-CM | POA: Diagnosis not present

## 2013-10-07 DIAGNOSIS — Z4801 Encounter for change or removal of surgical wound dressing: Secondary | ICD-10-CM | POA: Diagnosis not present

## 2013-10-12 DIAGNOSIS — T84498A Other mechanical complication of other internal orthopedic devices, implants and grafts, initial encounter: Secondary | ICD-10-CM | POA: Diagnosis not present

## 2013-10-12 DIAGNOSIS — M545 Low back pain: Secondary | ICD-10-CM | POA: Diagnosis not present

## 2013-10-12 DIAGNOSIS — I10 Essential (primary) hypertension: Secondary | ICD-10-CM | POA: Diagnosis not present

## 2013-10-12 DIAGNOSIS — Z4801 Encounter for change or removal of surgical wound dressing: Secondary | ICD-10-CM | POA: Diagnosis not present

## 2013-10-14 DIAGNOSIS — M545 Low back pain: Secondary | ICD-10-CM | POA: Diagnosis not present

## 2013-10-14 DIAGNOSIS — I10 Essential (primary) hypertension: Secondary | ICD-10-CM | POA: Diagnosis not present

## 2013-10-14 DIAGNOSIS — Z4801 Encounter for change or removal of surgical wound dressing: Secondary | ICD-10-CM | POA: Diagnosis not present

## 2013-10-14 DIAGNOSIS — T84498A Other mechanical complication of other internal orthopedic devices, implants and grafts, initial encounter: Secondary | ICD-10-CM | POA: Diagnosis not present

## 2013-10-16 DIAGNOSIS — M545 Low back pain: Secondary | ICD-10-CM | POA: Diagnosis not present

## 2013-10-16 DIAGNOSIS — Z4801 Encounter for change or removal of surgical wound dressing: Secondary | ICD-10-CM | POA: Diagnosis not present

## 2013-10-16 DIAGNOSIS — I10 Essential (primary) hypertension: Secondary | ICD-10-CM | POA: Diagnosis not present

## 2013-10-16 DIAGNOSIS — T84498A Other mechanical complication of other internal orthopedic devices, implants and grafts, initial encounter: Secondary | ICD-10-CM | POA: Diagnosis not present

## 2013-10-19 DIAGNOSIS — M545 Low back pain: Secondary | ICD-10-CM | POA: Diagnosis not present

## 2013-10-19 DIAGNOSIS — T84498A Other mechanical complication of other internal orthopedic devices, implants and grafts, initial encounter: Secondary | ICD-10-CM | POA: Diagnosis not present

## 2013-10-19 DIAGNOSIS — I10 Essential (primary) hypertension: Secondary | ICD-10-CM | POA: Diagnosis not present

## 2013-10-19 DIAGNOSIS — Z4801 Encounter for change or removal of surgical wound dressing: Secondary | ICD-10-CM | POA: Diagnosis not present

## 2013-10-21 DIAGNOSIS — Z4801 Encounter for change or removal of surgical wound dressing: Secondary | ICD-10-CM | POA: Diagnosis not present

## 2013-10-21 DIAGNOSIS — I10 Essential (primary) hypertension: Secondary | ICD-10-CM | POA: Diagnosis not present

## 2013-10-21 DIAGNOSIS — M545 Low back pain: Secondary | ICD-10-CM | POA: Diagnosis not present

## 2013-10-21 DIAGNOSIS — T84498A Other mechanical complication of other internal orthopedic devices, implants and grafts, initial encounter: Secondary | ICD-10-CM | POA: Diagnosis not present

## 2013-10-26 DIAGNOSIS — M545 Low back pain: Secondary | ICD-10-CM | POA: Diagnosis not present

## 2013-10-26 DIAGNOSIS — T84498A Other mechanical complication of other internal orthopedic devices, implants and grafts, initial encounter: Secondary | ICD-10-CM | POA: Diagnosis not present

## 2013-10-26 DIAGNOSIS — I10 Essential (primary) hypertension: Secondary | ICD-10-CM | POA: Diagnosis not present

## 2013-10-26 DIAGNOSIS — Z4801 Encounter for change or removal of surgical wound dressing: Secondary | ICD-10-CM | POA: Diagnosis not present

## 2013-10-28 DIAGNOSIS — Z4801 Encounter for change or removal of surgical wound dressing: Secondary | ICD-10-CM | POA: Diagnosis not present

## 2013-10-28 DIAGNOSIS — T84498A Other mechanical complication of other internal orthopedic devices, implants and grafts, initial encounter: Secondary | ICD-10-CM | POA: Diagnosis not present

## 2013-10-28 DIAGNOSIS — M545 Low back pain: Secondary | ICD-10-CM | POA: Diagnosis not present

## 2013-10-28 DIAGNOSIS — I10 Essential (primary) hypertension: Secondary | ICD-10-CM | POA: Diagnosis not present

## 2013-11-03 DIAGNOSIS — M545 Low back pain: Secondary | ICD-10-CM | POA: Diagnosis not present

## 2013-11-03 DIAGNOSIS — I10 Essential (primary) hypertension: Secondary | ICD-10-CM | POA: Diagnosis not present

## 2013-11-03 DIAGNOSIS — T84498A Other mechanical complication of other internal orthopedic devices, implants and grafts, initial encounter: Secondary | ICD-10-CM | POA: Diagnosis not present

## 2013-11-03 DIAGNOSIS — Z4801 Encounter for change or removal of surgical wound dressing: Secondary | ICD-10-CM | POA: Diagnosis not present

## 2013-11-20 DIAGNOSIS — M79609 Pain in unspecified limb: Secondary | ICD-10-CM | POA: Diagnosis not present

## 2013-11-30 ENCOUNTER — Other Ambulatory Visit: Payer: Self-pay | Admitting: *Deleted

## 2013-11-30 DIAGNOSIS — M65979 Unspecified synovitis and tenosynovitis, unspecified ankle and foot: Secondary | ICD-10-CM | POA: Diagnosis not present

## 2013-11-30 DIAGNOSIS — L97909 Non-pressure chronic ulcer of unspecified part of unspecified lower leg with unspecified severity: Secondary | ICD-10-CM

## 2013-11-30 DIAGNOSIS — M79609 Pain in unspecified limb: Secondary | ICD-10-CM

## 2013-11-30 DIAGNOSIS — M659 Synovitis and tenosynovitis, unspecified: Secondary | ICD-10-CM | POA: Diagnosis not present

## 2013-12-02 ENCOUNTER — Encounter: Payer: Self-pay | Admitting: Vascular Surgery

## 2013-12-02 DIAGNOSIS — IMO0002 Reserved for concepts with insufficient information to code with codable children: Secondary | ICD-10-CM | POA: Diagnosis not present

## 2013-12-02 DIAGNOSIS — M79609 Pain in unspecified limb: Secondary | ICD-10-CM | POA: Diagnosis not present

## 2013-12-03 ENCOUNTER — Encounter: Payer: Self-pay | Admitting: Vascular Surgery

## 2013-12-03 ENCOUNTER — Ambulatory Visit (INDEPENDENT_AMBULATORY_CARE_PROVIDER_SITE_OTHER): Payer: Medicare Other | Admitting: Vascular Surgery

## 2013-12-03 ENCOUNTER — Encounter: Payer: Self-pay | Admitting: *Deleted

## 2013-12-03 ENCOUNTER — Other Ambulatory Visit: Payer: Self-pay | Admitting: *Deleted

## 2013-12-03 ENCOUNTER — Ambulatory Visit (HOSPITAL_COMMUNITY)
Admission: RE | Admit: 2013-12-03 | Discharge: 2013-12-03 | Disposition: A | Discharge status: Home / Self Care | Payer: Medicare Other | Source: Ambulatory Visit | Attending: Vascular Surgery | Admitting: Vascular Surgery

## 2013-12-03 VITALS — BP 157/68 | HR 101 | Ht 64.0 in | Wt 103.5 lb

## 2013-12-03 DIAGNOSIS — I10 Essential (primary) hypertension: Secondary | ICD-10-CM | POA: Insufficient documentation

## 2013-12-03 DIAGNOSIS — L98499 Non-pressure chronic ulcer of skin of other sites with unspecified severity: Secondary | ICD-10-CM | POA: Diagnosis not present

## 2013-12-03 DIAGNOSIS — I739 Peripheral vascular disease, unspecified: Secondary | ICD-10-CM

## 2013-12-03 DIAGNOSIS — M79609 Pain in unspecified limb: Secondary | ICD-10-CM | POA: Diagnosis not present

## 2013-12-03 DIAGNOSIS — E785 Hyperlipidemia, unspecified: Secondary | ICD-10-CM | POA: Diagnosis not present

## 2013-12-03 DIAGNOSIS — F17200 Nicotine dependence, unspecified, uncomplicated: Secondary | ICD-10-CM | POA: Diagnosis not present

## 2013-12-03 DIAGNOSIS — I7025 Atherosclerosis of native arteries of other extremities with ulceration: Secondary | ICD-10-CM | POA: Insufficient documentation

## 2013-12-03 DIAGNOSIS — L97909 Non-pressure chronic ulcer of unspecified part of unspecified lower leg with unspecified severity: Secondary | ICD-10-CM | POA: Insufficient documentation

## 2013-12-03 NOTE — Progress Notes (Signed)
[facility] [place] HISTORY AND PHYSICAL   History of Present Illness:  Patient is a 73 y.o. year old male who presents for evaluation of left leg ischemia. The patient is referred by Dr. March Rummage from the from the [facility]. The patient has a chronic history of numbness and tingling from the knee down on the left leg since spine surgery last fall. Over the course of several weeks he has developed multiple ulcerations on his left lower extremity. He denies rest pain in the left foot but does have chronic numbness. He was seen by Dr. March Rummage on January 9 and referred here for further evaluation. He is currently apply Neosporin to the wounds. He denies any prior similar episodes. He currently smokes 5 cigars per day. He had a right carotid endarterectomy for symptomatic carotid stenosis by Dr. Amedeo Plenty in 2007. Other medical problems include hypertension, hyperlipidemia, COPD, prior history of bladder cancer of which are currently stable.  Past Medical History  Diagnosis Date  . Hypertension   . Dyslipidemia   . Cerebrovascular disease     right brain CVA 2007  . Lumbago   . Abnormality of gait 03/03/2013  . CVA (cerebral vascular accident) 2007    r-cva  . COPD (chronic obstructive pulmonary disease)   . Cough productive of clear sputum     TX RECENT SINUS INFECTION   . bladder ca dx'd 11/2009    chemo/xrt comp 02/2010    Past Surgical History  Procedure Laterality Date  . Carotid artery angioplasty  2009  . Back surgery  2010  . Bladder cancer    . Laminectomy  09/08/2013    L 2 L3 L4 L5       Dr Luiz Ochoa    Social History History  Substance Use Topics  . Smoking status: Former Smoker    Types: Cigarettes    Quit date: 11/12/2005  . Smokeless tobacco: Never Used  . Alcohol Use: 12.6 oz/week    21 Cans of beer per week    Family History Family History  Problem Relation Age of Onset  . Stroke Mother   . Cancer Father   . Dementia Father   . Cancer  Sister     Allergies  Allergies  Allergen Reactions  . Morphine And Related Itching    Lasts about 1 week beyond use.  Itching to entire body.     Current Outpatient Prescriptions  Medication Sig Dispense Refill  . amLODipine (NORVASC) 5 MG tablet Take 5 mg by mouth daily.      Marland Kitchen gabapentin (NEURONTIN) 300 MG capsule Take 1 capsule (300 mg total) by mouth 2 (two) times daily.  60 capsule  5  . lisinopril (PRINIVIL,ZESTRIL) 20 MG tablet Take 20 mg by mouth daily.      Marland Kitchen oxyCODONE-acetaminophen (PERCOCET/ROXICET) 5-325 MG per tablet Take 1-2 tablets by mouth every 4 (four) hours as needed for pain.  51 tablet  0  . Oxymorphone HCl, Crush Resist, (OPANA ER, CRUSH RESISTANT,) 20 MG T12A Take 1 tablet by mouth every 12 (twelve) hours.      . simvastatin (ZOCOR) 20 MG tablet Take 20 mg by mouth daily.      Marland Kitchen acetaminophen (TYLENOL) 500 MG tablet Take 1,000 mg by mouth every 6 (six) hours as needed for pain.      . cyclobenzaprine (FLEXERIL) 10 MG tablet Take 1 tablet (10 mg total) by mouth 3 (three) times daily as needed for muscle spasms.  Matheny  tablet  3   No current facility-administered medications for this visit.    ROS:   General:  No weight loss, Fever, chills  HEENT: No recent headaches, no nasal bleeding, no visual changes, no sore throat, he has had a recent cough, cold.  Neurologic: No dizziness, blackouts, seizures. No recent symptoms of stroke or mini- stroke. No recent episodes of slurred speech, or temporary blindness.  Cardiac: No recent episodes of chest pain/pressure, no shortness of breath at rest.  + shortness of breath with exertion.  Denies history of atrial fibrillation or irregular heartbeat  Vascular: No history of rest pain in feet.  No history of claudication.  No history of non-healing ulcer, No history of DVT   Pulmonary: No home oxygen, no productive cough, no hemoptysis,  No asthma or wheezing  Musculoskeletal:  [ ] Arthritis, [x ] Low back pain,  [ ]  Joint pain  Hematologic:No history of hypercoagulable state.  No history of easy bleeding.  No history of anemia  Gastrointestinal: No hematochezia or melena,  No gastroesophageal reflux, no trouble swallowing  Urinary: [ ] chronic Kidney disease, [ ] on HD - [ ] MWF or [ ] TTHS, [ ] Burning with urination, [ ] Frequent urination, [ ] Difficulty urinating;   Skin: No rashes  Psychological: No history of anxiety,  No history of depression   Physical Examination  Filed Vitals:   12/03/13 1539  BP: 157/68  Pulse: 101  Height: 5' 4" (1.626 m)  Weight: 103 lb 8 oz (46.947 kg)  SpO2: 97%    Body mass index is 17.76 kg/(m^2).  General:  Alert and oriented, no acute distress HEENT: Normal Neck: No bruit or JVD, well-healed right neck scar Pulmonary: Clear to auscultation bilaterally Cardiac: Regular Rate and Rhythm without murmur Abdomen: Soft, non-tender, non-distended, no mass Skin: No rash, full-thickness loss of epidermis multiple ulcerations left leg scattered ulcerations on the lateral aspect of the calf dorsal foot and pretibial area some of these are as deep as 1 cm, left lower extremity has a purplish and ruborous when dependent Extremity Pulses:  2+ radial, brachial, absent left femoral, 1+ right femoral absent popliteal dorsalis pedis, posterior tibial pulses bilaterally Musculoskeletal: No deformity or edema, 5 flexion contracture left knee  Neurologic: Upper and lower extremity motor 5/5 and symmetric  DATA:  Patient had bilateral ABIs performed today. 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 Most likely this represents acute worsening of chronic peripheral arterial disease.   PLAN:  Aortogram bilateral lower extremity runoff possible intervention in my partner Dr.  Dickson on January 26. If he requires aortoiliac reconstruction most likely we will do this in the operating room later during the week. Patient recognizes he is at high risk of limb loss.  Charles Fields, MD [facility] [place] Office: [phone] Pager: [phone]   

## 2013-12-06 MED ORDER — SODIUM CHLORIDE 0.9 % IV SOLN
INTRAVENOUS | Status: DC
  Administered 2013-12-07: 07:00:00 via INTRAVENOUS

## 2013-12-07 ENCOUNTER — Encounter (HOSPITAL_COMMUNITY)
Admission: RE | Disposition: A | Discharge status: Home / Self Care | Payer: Self-pay | Source: Ambulatory Visit | Attending: Vascular Surgery

## 2013-12-07 ENCOUNTER — Ambulatory Visit (HOSPITAL_COMMUNITY)
Admission: RE | Admit: 2013-12-07 | Discharge: 2013-12-07 | Disposition: A | Discharge status: Home / Self Care | Payer: Medicare Other | Source: Ambulatory Visit | Attending: Vascular Surgery | Admitting: Vascular Surgery

## 2013-12-07 DIAGNOSIS — I70229 Atherosclerosis of native arteries of extremities with rest pain, unspecified extremity: Secondary | ICD-10-CM | POA: Diagnosis not present

## 2013-12-07 DIAGNOSIS — I998 Other disorder of circulatory system: Secondary | ICD-10-CM | POA: Diagnosis present

## 2013-12-07 DIAGNOSIS — E785 Hyperlipidemia, unspecified: Secondary | ICD-10-CM | POA: Diagnosis present

## 2013-12-07 DIAGNOSIS — L97209 Non-pressure chronic ulcer of unspecified calf with unspecified severity: Secondary | ICD-10-CM | POA: Diagnosis present

## 2013-12-07 DIAGNOSIS — F17200 Nicotine dependence, unspecified, uncomplicated: Secondary | ICD-10-CM | POA: Diagnosis present

## 2013-12-07 DIAGNOSIS — L89109 Pressure ulcer of unspecified part of back, unspecified stage: Secondary | ICD-10-CM | POA: Diagnosis present

## 2013-12-07 DIAGNOSIS — Z889 Allergy status to unspecified drugs, medicaments and biological substances status: Secondary | ICD-10-CM

## 2013-12-07 DIAGNOSIS — J449 Chronic obstructive pulmonary disease, unspecified: Secondary | ICD-10-CM

## 2013-12-07 DIAGNOSIS — L8991 Pressure ulcer of unspecified site, stage 1: Secondary | ICD-10-CM | POA: Diagnosis present

## 2013-12-07 DIAGNOSIS — Z8551 Personal history of malignant neoplasm of bladder: Secondary | ICD-10-CM | POA: Insufficient documentation

## 2013-12-07 DIAGNOSIS — J4489 Other specified chronic obstructive pulmonary disease: Secondary | ICD-10-CM | POA: Insufficient documentation

## 2013-12-07 DIAGNOSIS — Z79899 Other long term (current) drug therapy: Secondary | ICD-10-CM

## 2013-12-07 DIAGNOSIS — I739 Peripheral vascular disease, unspecified: Secondary | ICD-10-CM

## 2013-12-07 DIAGNOSIS — L97509 Non-pressure chronic ulcer of other part of unspecified foot with unspecified severity: Secondary | ICD-10-CM | POA: Diagnosis present

## 2013-12-07 DIAGNOSIS — Z8673 Personal history of transient ischemic attack (TIA), and cerebral infarction without residual deficits: Secondary | ICD-10-CM | POA: Insufficient documentation

## 2013-12-07 DIAGNOSIS — Z923 Personal history of irradiation: Secondary | ICD-10-CM

## 2013-12-07 DIAGNOSIS — L98499 Non-pressure chronic ulcer of skin of other sites with unspecified severity: Secondary | ICD-10-CM | POA: Insufficient documentation

## 2013-12-07 DIAGNOSIS — Z87891 Personal history of nicotine dependence: Secondary | ICD-10-CM | POA: Insufficient documentation

## 2013-12-07 DIAGNOSIS — M24569 Contracture, unspecified knee: Secondary | ICD-10-CM | POA: Diagnosis present

## 2013-12-07 DIAGNOSIS — L97909 Non-pressure chronic ulcer of unspecified part of unspecified lower leg with unspecified severity: Secondary | ICD-10-CM

## 2013-12-07 DIAGNOSIS — Z809 Family history of malignant neoplasm, unspecified: Secondary | ICD-10-CM

## 2013-12-07 DIAGNOSIS — Z823 Family history of stroke: Secondary | ICD-10-CM

## 2013-12-07 DIAGNOSIS — I10 Essential (primary) hypertension: Secondary | ICD-10-CM | POA: Diagnosis present

## 2013-12-07 DIAGNOSIS — E43 Unspecified severe protein-calorie malnutrition: Secondary | ICD-10-CM | POA: Diagnosis present

## 2013-12-07 DIAGNOSIS — Z681 Body mass index (BMI) 19 or less, adult: Secondary | ICD-10-CM

## 2013-12-07 HISTORY — PX: ABDOMINAL AORTAGRAM: SHX5454

## 2013-12-07 LAB — POCT I-STAT, CHEM 8
BUN: 10 mg/dL (ref 6–23)
CALCIUM ION: 1.11 mmol/L — AB (ref 1.13–1.30)
Chloride: 96 mEq/L (ref 96–112)
Creatinine, Ser: 0.5 mg/dL (ref 0.50–1.35)
Glucose, Bld: 97 mg/dL (ref 70–99)
HCT: 34 % — ABNORMAL LOW (ref 39.0–52.0)
Hemoglobin: 11.6 g/dL — ABNORMAL LOW (ref 13.0–17.0)
Potassium: 3.3 mEq/L — ABNORMAL LOW (ref 3.7–5.3)
Sodium: 133 mEq/L — ABNORMAL LOW (ref 137–147)
TCO2: 23 mmol/L (ref 0–100)

## 2013-12-07 LAB — POCT ACTIVATED CLOTTING TIME
ACTIVATED CLOTTING TIME: 160 s
Activated Clotting Time: 221 seconds
Activated Clotting Time: 193 seconds

## 2013-12-07 SURGERY — ABDOMINAL AORTAGRAM
Anesthesia: LOCAL | Laterality: Right

## 2013-12-07 MED ORDER — HYDROMORPHONE HCL PF 1 MG/ML IJ SOLN
INTRAMUSCULAR | Status: AC
  Filled 2013-12-07: qty 1

## 2013-12-07 MED ORDER — POTASSIUM CHLORIDE 20 MEQ/15ML (10%) PO LIQD
ORAL | Status: AC
Start: 2013-12-07 — End: 2013-12-07
  Filled 2013-12-07: qty 30

## 2013-12-07 MED ORDER — HEPARIN SODIUM (PORCINE) 1000 UNIT/ML IJ SOLN
INTRAMUSCULAR | Status: AC
  Filled 2013-12-07: qty 1

## 2013-12-07 MED ORDER — POTASSIUM CHLORIDE IN NACL 20-0.9 MEQ/L-% IV SOLN
INTRAVENOUS | Status: DC
Start: 2013-12-07 — End: 2013-12-07
  Filled 2013-12-07 (×2): qty 1000

## 2013-12-07 MED ORDER — HYDROMORPHONE HCL PF 1 MG/ML IJ SOLN
INTRAMUSCULAR | Status: AC
Start: 2013-12-07 — End: 2013-12-07
  Filled 2013-12-07: qty 1

## 2013-12-07 MED ORDER — FENTANYL CITRATE 0.05 MG/ML IJ SOLN
INTRAMUSCULAR | Status: AC
  Filled 2013-12-07: qty 2

## 2013-12-07 MED ORDER — OXYCODONE-ACETAMINOPHEN 5-325 MG PO TABS
ORAL_TABLET | ORAL | Status: AC
  Filled 2013-12-07: qty 1

## 2013-12-07 MED ORDER — MIDAZOLAM HCL 2 MG/2ML IJ SOLN
INTRAMUSCULAR | Status: AC
Start: 2013-12-07 — End: 2013-12-07
  Filled 2013-12-07: qty 2

## 2013-12-07 MED ORDER — ACETAMINOPHEN 325 MG PO TABS: 650.0000 mg | ORAL_TABLET | ORAL | Status: DC | PRN

## 2013-12-07 MED ORDER — HYDROMORPHONE BOLUS VIA INFUSION
0.5000 mg | INTRAVENOUS | Status: DC | PRN
  Administered 2013-12-07: 0.5 mg via INTRAVENOUS
  Filled 2013-12-07: qty 1

## 2013-12-07 MED ORDER — LIDOCAINE HCL (PF) 1 % IJ SOLN
INTRAMUSCULAR | Status: AC
Start: 2013-12-07 — End: 2013-12-07
  Filled 2013-12-07: qty 30

## 2013-12-07 MED ORDER — POTASSIUM CHLORIDE CRYS ER 20 MEQ PO TBCR
20.0000 meq | EXTENDED_RELEASE_TABLET | Freq: Once | ORAL | Status: DC
  Filled 2013-12-07: qty 1

## 2013-12-07 MED ORDER — ONDANSETRON HCL 4 MG/2ML IJ SOLN
INTRAMUSCULAR | Status: AC
  Filled 2013-12-07: qty 2

## 2013-12-07 MED ORDER — HEPARIN (PORCINE) IN NACL 2-0.9 UNIT/ML-% IJ SOLN
INTRAMUSCULAR | Status: AC
  Filled 2013-12-07: qty 1000

## 2013-12-07 MED ORDER — ONDANSETRON HCL 4 MG/2ML IJ SOLN: 4.0000 mg | Freq: Four times a day (QID) | INTRAMUSCULAR | Status: DC | PRN

## 2013-12-07 MED ORDER — OXYCODONE-ACETAMINOPHEN 5-325 MG PO TABS
1.0000 | ORAL_TABLET | ORAL | Status: DC | PRN
  Administered 2013-12-07 (×2): 1 via ORAL
  Filled 2013-12-07: qty 1

## 2013-12-07 MED ORDER — SODIUM CHLORIDE 0.9 % IV SOLN
1.0000 mL/kg/h | INTRAVENOUS | Status: DC
Start: 2013-12-07 — End: 2013-12-07

## 2013-12-07 NOTE — Discharge Instructions (Signed)
Angiography, Care After ° °Refer to this sheet in the next few weeks. These instructions provide you with information on caring for yourself after your procedure. Your health care provider may also give you more specific instructions. Your treatment has been planned according to current medical practices, but problems sometimes occur. Call your health care provider if you have any problems or questions after your procedure.  °WHAT TO EXPECT AFTER THE PROCEDURE °After your procedure, it is typical to have the following sensations: °· Minor discomfort or tenderness and a small bump at the catheter insertion site. The bump should usually decrease in size and tenderness within 1 to 2 weeks. °· Any bruising will usually fade within 2 to 4 weeks. °HOME CARE INSTRUCTIONS  °· You may need to keep taking blood thinners if they were prescribed for you. Only take over-the-counter or prescription medicines for pain, fever, or discomfort as directed by your health care provider. °· Do not apply powder or lotion to the site. °· Do not sit in a bathtub, swimming pool, or whirlpool for 5 to 7 days. °· You may shower 24 hours after the procedure. Remove the bandage (dressing) and gently wash the site with plain soap and water. Gently pat the site dry. °· Inspect the site at least twice daily. °· Limit your activity for the first 24 hours. Do not bend, squat, or lift anything over 10 lb (9 kg) or as directed by your health care provider. °· Do not drive home if you are discharged the day of the procedure. Have someone else drive you. Follow instructions about when you can drive or return to work. °SEEK MEDICAL CARE IF: °· You get lightheaded when standing up. °· You have drainage (other than a small amount of blood on the dressing). °· You have chills. °· You have a fever. °· You have redness, warmth, swelling, or pain at the insertion site. °SEEK IMMEDIATE MEDICAL CARE IF:  °· You develop chest pain or shortness of breath, feel  faint, or pass out. °· You have bleeding, swelling larger than a walnut, or drainage from the catheter insertion site. °· You develop pain, discoloration, coldness, or severe bruising in the leg or arm that held the catheter. °· You have heavy bleeding from the site. If this happens, hold pressure on the site. °MAKE SURE YOU: °· Understand these instructions. °· Will watch your condition. °· Will get help right away if you are not doing well or get worse. °Document Released: 05/17/2005 Document Revised: 07/01/2013 Document Reviewed: 03/23/2013 °ExitCare® Patient Information ©2014 [employer]. ° °

## 2013-12-07 NOTE — Interval H&P Note (Signed)
History and Physical Interval Note:  12/07/2013 8:45 AM  Thomas Ferrell  has presented today for surgery, with the diagnosis of PVD  The various methods of treatment have been discussed with the patient and family. After consideration of risks, benefits and other options for treatment, the patient has consented to  Procedure(s): ABDOMINAL AORTAGRAM (N/A) as a surgical intervention .  The patient's history has been reviewed, patient examined, no change in status, stable for surgery.  I have reviewed the patient's chart and labs.  Questions were answered to the patient's satisfaction.     DICKSON,CHRISTOPHER S

## 2013-12-07 NOTE — Progress Notes (Signed)
Discharge instruction given per MD order.  Pt and CG able to verbalize understanding.  Pt to car via wheelchair. 

## 2013-12-07 NOTE — H&P (View-Only) (Signed)
[facility]ISTORY [facility]   History of Present Illness:  Patient is a 73 y.o. year old male who presents for evaluation of left leg ischemia. The patient is referred by Dr. March Rummage from the from the [facility]. The patient has a chronic history of numbness and tingling from the knee down on the left leg since spine surgery last fall. Over the course of several weeks he has developed multiple ulcerations on his left lower extremity. He denies rest pain in the left foot but does have chronic numbness. He was seen by Dr. March Rummage on January 9 and referred here for further evaluation. He is currently apply Neosporin to the wounds. He denies any prior similar episodes. He currently smokes 5 cigars per day. He had a right carotid endarterectomy for symptomatic carotid stenosis by Dr. Amedeo Plenty in 2007. Other medical problems include hypertension, hyperlipidemia, COPD, prior history of bladder cancer of which are currently stable.  Past Medical History  Diagnosis Date  . Hypertension   . Dyslipidemia   . Cerebrovascular disease     right brain CVA 2007  . Lumbago   . Abnormality of gait 03/03/2013  . CVA (cerebral vascular accident) 2007    r-cva  . COPD (chronic obstructive pulmonary disease)   . Cough productive of clear sputum     TX RECENT SINUS INFECTION   . bladder ca dx'd 11/2009    chemo/xrt comp 02/2010    Past Surgical History  Procedure Laterality Date  . Carotid artery angioplasty  2009  . Back surgery  2010  . Bladder cancer    . Laminectomy  09/08/2013    L 2 L3 L4 L5       Dr Luiz Ochoa    Social History History  Substance Use Topics  . Smoking status: Former Smoker    Types: Cigarettes    Quit date: 11/12/2005  . Smokeless tobacco: Never Used  . Alcohol Use: 12.6 oz/week    21 Cans of beer per week    Family History Family History  Problem Relation Age of Onset  . Stroke Mother   . Cancer Father   . Dementia Father   . Cancer  Sister     Allergies  Allergies  Allergen Reactions  . Morphine And Related Itching    Lasts about 1 week beyond use.  Itching to entire body.     Current Outpatient Prescriptions  Medication Sig Dispense Refill  . amLODipine (NORVASC) 5 MG tablet Take 5 mg by mouth daily.      Marland Kitchen gabapentin (NEURONTIN) 300 MG capsule Take 1 capsule (300 mg total) by mouth 2 (two) times daily.  60 capsule  5  . lisinopril (PRINIVIL,ZESTRIL) 20 MG tablet Take 20 mg by mouth daily.      Marland Kitchen oxyCODONE-acetaminophen (PERCOCET/ROXICET) 5-325 MG per tablet Take 1-2 tablets by mouth every 4 (four) hours as needed for pain.  51 tablet  0  . Oxymorphone HCl, Crush Resist, (OPANA ER, CRUSH RESISTANT,) 20 MG T12A Take 1 tablet by mouth every 12 (twelve) hours.      . simvastatin (ZOCOR) 20 MG tablet Take 20 mg by mouth daily.      Marland Kitchen acetaminophen (TYLENOL) 500 MG tablet Take 1,000 mg by mouth every 6 (six) hours as needed for pain.      . cyclobenzaprine (FLEXERIL) 10 MG tablet Take 1 tablet (10 mg total) by mouth 3 (three) times daily as needed for muscle spasms.  Matheny  tablet  3   No current facility-administered medications for this visit.    ROS:   General:  No weight loss, Fever, chills  HEENT: No recent headaches, no nasal bleeding, no visual changes, no sore throat, he has had a recent cough, cold.  Neurologic: No dizziness, blackouts, seizures. No recent symptoms of stroke or mini- stroke. No recent episodes of slurred speech, or temporary blindness.  Cardiac: No recent episodes of chest pain/pressure, no shortness of breath at rest.  + shortness of breath with exertion.  Denies history of atrial fibrillation or irregular heartbeat  Vascular: No history of rest pain in feet.  No history of claudication.  No history of non-healing ulcer, No history of DVT   Pulmonary: No home oxygen, no productive cough, no hemoptysis,  No asthma or wheezing  Musculoskeletal:  [ ] Arthritis, [x ] Low back pain,  [ ]  Joint pain  Hematologic:No history of hypercoagulable state.  No history of easy bleeding.  No history of anemia  Gastrointestinal: No hematochezia or melena,  No gastroesophageal reflux, no trouble swallowing  Urinary: [ ] chronic Kidney disease, [ ] on HD - [ ] MWF or [ ] TTHS, [ ] Burning with urination, [ ] Frequent urination, [ ] Difficulty urinating;   Skin: No rashes  Psychological: No history of anxiety,  No history of depression   Physical Examination  Filed Vitals:   12/03/13 1539  BP: 157/68  Pulse: 101  Height: 5' 4" (1.626 m)  Weight: 103 lb 8 oz (46.947 kg)  SpO2: 97%    Body mass index is 17.76 kg/(m^2).  General:  Alert and oriented, no acute distress HEENT: Normal Neck: No bruit or JVD, well-healed right neck scar Pulmonary: Clear to auscultation bilaterally Cardiac: Regular Rate and Rhythm without murmur Abdomen: Soft, non-tender, non-distended, no mass Skin: No rash, full-thickness loss of epidermis multiple ulcerations left leg scattered ulcerations on the lateral aspect of the calf dorsal foot and pretibial area some of these are as deep as 1 cm, left lower extremity has a purplish and ruborous when dependent Extremity Pulses:  2+ radial, brachial, absent left femoral, 1+ right femoral absent popliteal dorsalis pedis, posterior tibial pulses bilaterally Musculoskeletal: No deformity or edema, 5 flexion contracture left knee  Neurologic: Upper and lower extremity motor 5/5 and symmetric  DATA:  Patient had bilateral ABIs performed today. 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 Most likely this represents acute worsening of chronic peripheral arterial disease.   PLAN:  Aortogram bilateral lower extremity runoff possible intervention in my partner Dr.  Dickson on January 26. If he requires aortoiliac reconstruction most likely we will do this in the operating room later during the week. Patient recognizes he is at high risk of limb loss.  Chevelle Coulson, MD [facility] [place] Office: [phone] Pager: [phone]   

## 2013-12-07 NOTE — Op Note (Signed)
   PATIENT: Thomas Ferrell   MRN: [id number] DOB: [birth year]    DATE OF PROCEDURE: 12/07/2013  INDICATIONS: [name] is a 73 y.o. male With extensive wounds of his left lower extremity. He presents for arteriography and possible angioplasty.  PROCEDURE:  1. Ultrasound-guided access to the right common femoral artery 2. Aortogram with bilateral iliac arteriogram and bilateral lower extremity runoff 3. PTA and stenting of right common iliac artery  SURGEON: Judeth Cornfield. Scot Dock, MD, FACS  ANESTHESIA: local with sedation   EBL: minimal  TECHNIQUE: The patient was taken to the peripheral vascular lab and was sedated with a milligram of Versed and 50 mcg of fentanyl. He received additional sedation throughout the case. He had significant rest pain of the left foot throughout the case. I interrogated the right femoral artery with the duplex scanner and he had significant calcific disease present here. There is no femoral pulse on the left. I was able to find an area above the calcific plaque to cannulate the artery. This was done under ultrasound guidance after the skin was anesthetized. The micropuncture sheath was exchanged for a 5 [place] sheath over a [name] wire. The pigtail catheter was positioned at the L1 vertebral body a flush aortogram obtained. The catheter was positioned above the aortic bifurcation and oblique iliac projection was obtained. Bilateral lower extremity runoff films were obtained. I then measured a resting pressure gradient across the right common iliac artery stenosis and there was a gradient of 40 mm of mercury at rest. I elected to address this with balloon angioplasty and stenting.  The 5 French sheath was exchanged for a long bright-tipped 6 [place] sheath. The patient received 4000 units of IV heparin. ACT subsequently was 225. I selected a 7 mm x 3 cm self-expanding stent which was positioned across the stenosis and deployed without difficulty. Postdilatation was  done with a 6 mm x 2 cm balloon. The lesion film showed a good result. I elected not to Perclose device even on a stick was slightly high given that the patient had calcific disease in the artery.  The patient was transferred to the holding area for removal of the sheath once the ACT was less than 175.  FINDINGS:  1. Diffuse calcific disease the infrarenal aorta stenosis. 2. There is a 60% stenosis of the distal right common iliac artery which was successfully ballooned and stented as described above. The left common iliac artery was occluded. Both hypogastric arteries are occluded. The external iliac arteries are patent. 3. On the right side, there is a calcific plaque in the common femoral artery. The deep femoral artery and superficial femoral artery are patent. The proximal artery, anterior tibial artery, tibial peroneal trunk, posterior tibial, and peroneal arteries are patent.  4. On the left side, there is some calcific plaque in the left common femoral artery. The deep femoral artery is patent. The superficial femoral artery and the left is occluded. There is moderate diffuse disease a popliteal  artery. The below-knee popliteal artery is patent and there is a patent anterior tibial artery on the left. The proximal peroneal and proximal posterior tibial arteries are patent although they are then occluded in the proximal leg.  Deitra Mayo, MD, FACS [facility]  DATE OF DICTATION:   12/07/2013

## 2013-12-07 NOTE — Progress Notes (Signed)
Pt has not has his PO KCL or his IV KCL.  The pharmacy has not sent the IV potassium and The med had not been verified and I could not over ride it.  Cath lab is aware of this and they said to send him over.  Will continue to monitor

## 2013-12-08 ENCOUNTER — Other Ambulatory Visit: Payer: Self-pay

## 2013-12-08 ENCOUNTER — Telehealth: Payer: Self-pay

## 2013-12-08 MED ORDER — DEXTROSE 5 % IV SOLN
1.5000 g | INTRAVENOUS | Status: AC
  Administered 2013-12-09: 1.5 g via INTRAVENOUS
  Filled 2013-12-08: qty 1.5

## 2013-12-08 NOTE — Progress Notes (Signed)
I was unable to reach patient by phone.  I left  A message on voice mail.  I instructed the patient to arrive at [facility] entrance at 0900  , nothing to eat or drink after 0500 as instructed by Arbie Cookey, (I saw note from [place] in [place])  I instructed the patient to take the following medications in the am with just enough water to get them down:  Amlodipine, Keppra; if needed Oxycodone. asked patient to not wear any lotions, powders, cologne, jewelry, piercing, make-up or nail polish.  I asked the patient to call [phone], in the am if there were any questions or problems.

## 2013-12-08 NOTE — Telephone Encounter (Signed)
Pt's. wife walked into office to report pt. having continuous pain and numbness from left knee down to foot.  Reports "black sores on the lower leg, and the color of the left foot is purple."  Reports pt. has been taking Percocet 5/325 mg., 2 tabs every 2 hrs., due to the continuous pain.  Wife tearful and expressed concern that the patient is in fear of losing his leg.  Discussed symptoms with [name].  Recommends to schedule for surgery tomorrow; Femoral-Femoral Bypass, Left Femoral-Popliteal Bypass, and possible left leg amputation.

## 2013-12-08 NOTE — Telephone Encounter (Signed)
Contacted pt's wife per phone.  Advised of [name] recommendation for left leg bypass tomorrow, with possibility of amputation.  Advised if pain becomes too unbearable tonight, to take pt. to the ER.  Agrees with plan.  Instructed to have pt. NPO after 5:00 AM 1/28, and to arrive at the Admitting Dept. At [facility] at 9:00 AM.  Verb. Understanding.

## 2013-12-09 ENCOUNTER — Telehealth: Payer: Self-pay | Admitting: Vascular Surgery

## 2013-12-09 ENCOUNTER — Encounter (HOSPITAL_COMMUNITY): Payer: Self-pay | Admitting: *Deleted

## 2013-12-09 ENCOUNTER — Inpatient Hospital Stay (HOSPITAL_COMMUNITY)
Admission: RE | Admit: 2013-12-09 | Discharge: 2013-12-12 | DRG: 252 | Disposition: A | Discharge status: Home Health | Payer: Medicare Other | Source: Ambulatory Visit | Attending: Vascular Surgery | Admitting: Vascular Surgery

## 2013-12-09 ENCOUNTER — Inpatient Hospital Stay (HOSPITAL_COMMUNITY): Payer: Medicare Other | Admitting: Anesthesiology

## 2013-12-09 ENCOUNTER — Encounter (HOSPITAL_COMMUNITY)
Admission: RE | Disposition: A | Discharge status: Home Health | Payer: Self-pay | Source: Ambulatory Visit | Attending: Vascular Surgery

## 2013-12-09 ENCOUNTER — Encounter (HOSPITAL_COMMUNITY): Payer: Medicare Other | Admitting: Anesthesiology

## 2013-12-09 DIAGNOSIS — Z8551 Personal history of malignant neoplasm of bladder: Secondary | ICD-10-CM | POA: Diagnosis not present

## 2013-12-09 DIAGNOSIS — M545 Low back pain, unspecified: Secondary | ICD-10-CM | POA: Diagnosis not present

## 2013-12-09 DIAGNOSIS — Z48812 Encounter for surgical aftercare following surgery on the circulatory system: Secondary | ICD-10-CM | POA: Diagnosis not present

## 2013-12-09 DIAGNOSIS — J449 Chronic obstructive pulmonary disease, unspecified: Secondary | ICD-10-CM | POA: Diagnosis not present

## 2013-12-09 DIAGNOSIS — L98499 Non-pressure chronic ulcer of skin of other sites with unspecified severity: Principal | ICD-10-CM

## 2013-12-09 DIAGNOSIS — L8991 Pressure ulcer of unspecified site, stage 1: Secondary | ICD-10-CM | POA: Diagnosis present

## 2013-12-09 DIAGNOSIS — L97209 Non-pressure chronic ulcer of unspecified calf with unspecified severity: Secondary | ICD-10-CM | POA: Diagnosis present

## 2013-12-09 DIAGNOSIS — L97509 Non-pressure chronic ulcer of other part of unspecified foot with unspecified severity: Secondary | ICD-10-CM | POA: Diagnosis present

## 2013-12-09 DIAGNOSIS — Z889 Allergy status to unspecified drugs, medicaments and biological substances status: Secondary | ICD-10-CM | POA: Diagnosis not present

## 2013-12-09 DIAGNOSIS — I739 Peripheral vascular disease, unspecified: Principal | ICD-10-CM

## 2013-12-09 DIAGNOSIS — E43 Unspecified severe protein-calorie malnutrition: Secondary | ICD-10-CM | POA: Diagnosis not present

## 2013-12-09 DIAGNOSIS — L89109 Pressure ulcer of unspecified part of back, unspecified stage: Secondary | ICD-10-CM | POA: Diagnosis not present

## 2013-12-09 DIAGNOSIS — Z809 Family history of malignant neoplasm, unspecified: Secondary | ICD-10-CM | POA: Diagnosis not present

## 2013-12-09 DIAGNOSIS — I70229 Atherosclerosis of native arteries of extremities with rest pain, unspecified extremity: Secondary | ICD-10-CM

## 2013-12-09 DIAGNOSIS — I998 Other disorder of circulatory system: Secondary | ICD-10-CM | POA: Diagnosis present

## 2013-12-09 DIAGNOSIS — M24569 Contracture, unspecified knee: Secondary | ICD-10-CM | POA: Diagnosis present

## 2013-12-09 DIAGNOSIS — Z79899 Other long term (current) drug therapy: Secondary | ICD-10-CM | POA: Diagnosis not present

## 2013-12-09 DIAGNOSIS — E785 Hyperlipidemia, unspecified: Secondary | ICD-10-CM | POA: Diagnosis present

## 2013-12-09 DIAGNOSIS — I10 Essential (primary) hypertension: Secondary | ICD-10-CM | POA: Diagnosis not present

## 2013-12-09 DIAGNOSIS — Z681 Body mass index (BMI) 19 or less, adult: Secondary | ICD-10-CM | POA: Diagnosis not present

## 2013-12-09 DIAGNOSIS — Z823 Family history of stroke: Secondary | ICD-10-CM | POA: Diagnosis not present

## 2013-12-09 DIAGNOSIS — Z8673 Personal history of transient ischemic attack (TIA), and cerebral infarction without residual deficits: Secondary | ICD-10-CM | POA: Diagnosis not present

## 2013-12-09 DIAGNOSIS — J4489 Other specified chronic obstructive pulmonary disease: Secondary | ICD-10-CM | POA: Diagnosis not present

## 2013-12-09 DIAGNOSIS — R209 Unspecified disturbances of skin sensation: Secondary | ICD-10-CM | POA: Diagnosis not present

## 2013-12-09 DIAGNOSIS — F17200 Nicotine dependence, unspecified, uncomplicated: Secondary | ICD-10-CM | POA: Diagnosis present

## 2013-12-09 HISTORY — PX: FEMORAL-POPLITEAL BYPASS GRAFT: SHX937

## 2013-12-09 HISTORY — PX: FEMORAL-FEMORAL BYPASS GRAFT: SHX936

## 2013-12-09 LAB — CBC
HEMATOCRIT: 31.4 % — AB (ref 39.0–52.0)
Hemoglobin: 10.5 g/dL — ABNORMAL LOW (ref 13.0–17.0)
MCH: 28.8 pg (ref 26.0–34.0)
MCHC: 33.4 g/dL (ref 30.0–36.0)
MCV: 86 fL (ref 78.0–100.0)
PLATELETS: 400 10*3/uL (ref 150–400)
RBC: 3.65 MIL/uL — AB (ref 4.22–5.81)
RDW: 15.1 % (ref 11.5–15.5)
WBC: 13.6 10*3/uL — AB (ref 4.0–10.5)

## 2013-12-09 LAB — URINE MICROSCOPIC-ADD ON

## 2013-12-09 LAB — URINALYSIS, ROUTINE W REFLEX MICROSCOPIC
BILIRUBIN URINE: NEGATIVE
GLUCOSE, UA: NEGATIVE mg/dL
HGB URINE DIPSTICK: NEGATIVE
Ketones, ur: 40 mg/dL — AB
Leukocytes, UA: NEGATIVE
Nitrite: NEGATIVE
Protein, ur: 30 mg/dL — AB
Specific Gravity, Urine: 1.018 (ref 1.005–1.030)
Urobilinogen, UA: 1 mg/dL (ref 0.0–1.0)
pH: 7 (ref 5.0–8.0)

## 2013-12-09 LAB — COMPREHENSIVE METABOLIC PANEL
ALK PHOS: 103 U/L (ref 39–117)
ALT: 22 U/L (ref 0–53)
AST: 35 U/L (ref 0–37)
Albumin: 2.8 g/dL — ABNORMAL LOW (ref 3.5–5.2)
BILIRUBIN TOTAL: 0.3 mg/dL (ref 0.3–1.2)
BUN: 10 mg/dL (ref 6–23)
CHLORIDE: 96 meq/L (ref 96–112)
CO2: 24 meq/L (ref 19–32)
CREATININE: 0.48 mg/dL — AB (ref 0.50–1.35)
Calcium: 8.3 mg/dL — ABNORMAL LOW (ref 8.4–10.5)
GFR calc Af Amer: >90 mL/min (ref >90)
Glucose, Bld: 123 mg/dL — ABNORMAL HIGH (ref 70–99)
POTASSIUM: 3.1 meq/L — AB (ref 3.7–5.3)
Sodium: 136 mEq/L — ABNORMAL LOW (ref 137–147)
Total Protein: 6.1 g/dL (ref 6.0–8.3)

## 2013-12-09 LAB — APTT: APTT: 39 s — AB (ref 24–37)

## 2013-12-09 LAB — TYPE AND SCREEN
ABO/RH(D): A POS
ANTIBODY SCREEN: NEGATIVE

## 2013-12-09 LAB — PROTIME-INR
INR: 1.14 (ref 0.00–1.49)
PROTHROMBIN TIME: 14.4 s (ref 11.6–15.2)

## 2013-12-09 LAB — SURGICAL PCR SCREEN
MRSA, PCR: NEGATIVE
Staphylococcus aureus: NEGATIVE

## 2013-12-09 SURGERY — CREATION, BYPASS, ARTERIAL, FEMORAL TO FEMORAL, USING GRAFT
Anesthesia: General | Site: Leg Upper

## 2013-12-09 MED ORDER — MUPIROCIN 2 % EX OINT
TOPICAL_OINTMENT | Freq: Once | CUTANEOUS | Status: AC
  Administered 2013-12-09: 13:00:00 via NASAL
  Filled 2013-12-09: qty 22

## 2013-12-09 MED ORDER — HYDRALAZINE HCL 20 MG/ML IJ SOLN: 10.0000 mg | INTRAMUSCULAR | Status: DC | PRN

## 2013-12-09 MED ORDER — ARTIFICIAL TEARS OP OINT
TOPICAL_OINTMENT | OPHTHALMIC | Status: AC
  Filled 2013-12-09: qty 3.5

## 2013-12-09 MED ORDER — ROCURONIUM BROMIDE 100 MG/10ML IV SOLN
INTRAVENOUS | Status: DC | PRN
  Administered 2013-12-09: 30 mg via INTRAVENOUS
  Administered 2013-12-09: 50 mg via INTRAVENOUS

## 2013-12-09 MED ORDER — SODIUM CHLORIDE 0.9 % IV SOLN: 500.0000 mL | Freq: Once | INTRAVENOUS | Status: AC | PRN

## 2013-12-09 MED ORDER — LIDOCAINE HCL (CARDIAC) 20 MG/ML IV SOLN
INTRAVENOUS | Status: AC
  Filled 2013-12-09: qty 5

## 2013-12-09 MED ORDER — AMLODIPINE BESYLATE 5 MG PO TABS
5.0000 mg | ORAL_TABLET | Freq: Every day | ORAL | Status: DC
  Administered 2013-12-10 – 2013-12-12 (×3): 5 mg via ORAL
  Filled 2013-12-09 (×4): qty 1

## 2013-12-09 MED ORDER — HEPARIN SODIUM (PORCINE) 1000 UNIT/ML IJ SOLN
INTRAMUSCULAR | Status: AC
  Filled 2013-12-09: qty 1

## 2013-12-09 MED ORDER — ROCURONIUM BROMIDE 50 MG/5ML IV SOLN
INTRAVENOUS | Status: AC
  Filled 2013-12-09: qty 1

## 2013-12-09 MED ORDER — GLYCOPYRROLATE 0.2 MG/ML IJ SOLN
INTRAMUSCULAR | Status: DC | PRN
  Administered 2013-12-09: 0.4 mg via INTRAVENOUS

## 2013-12-09 MED ORDER — PROTAMINE SULFATE 10 MG/ML IV SOLN
INTRAVENOUS | Status: DC | PRN
  Administered 2013-12-09: 50 mg via INTRAVENOUS

## 2013-12-09 MED ORDER — LISINOPRIL 20 MG PO TABS
20.0000 mg | ORAL_TABLET | Freq: Every day | ORAL | Status: DC
  Administered 2013-12-10 – 2013-12-12 (×3): 20 mg via ORAL
  Filled 2013-12-09 (×4): qty 1

## 2013-12-09 MED ORDER — SIMVASTATIN 20 MG PO TABS
20.0000 mg | ORAL_TABLET | Freq: Every day | ORAL | Status: DC
  Administered 2013-12-10 – 2013-12-12 (×3): 20 mg via ORAL
  Filled 2013-12-09 (×4): qty 1

## 2013-12-09 MED ORDER — ACETAMINOPHEN 650 MG RE SUPP
325.0000 mg | RECTAL | Status: DC | PRN
Start: 2013-12-09 — End: 2013-12-12

## 2013-12-09 MED ORDER — PROPOFOL 10 MG/ML IV BOLUS
INTRAVENOUS | Status: DC | PRN
  Administered 2013-12-09: 200 mg via INTRAVENOUS

## 2013-12-09 MED ORDER — PAPAVERINE HCL 30 MG/ML IJ SOLN
INTRAMUSCULAR | Status: AC
  Filled 2013-12-09: qty 2

## 2013-12-09 MED ORDER — ENOXAPARIN SODIUM 40 MG/0.4ML ~~LOC~~ SOLN
40.0000 mg | SUBCUTANEOUS | Status: DC
  Administered 2013-12-10 – 2013-12-11 (×2): 40 mg via SUBCUTANEOUS
  Filled 2013-12-09 (×3): qty 0.4

## 2013-12-09 MED ORDER — POTASSIUM CHLORIDE CRYS ER 20 MEQ PO TBCR: 20.0000 meq | EXTENDED_RELEASE_TABLET | Freq: Every day | ORAL | Status: DC | PRN

## 2013-12-09 MED ORDER — LACTATED RINGERS IV SOLN
INTRAVENOUS | Status: DC
  Administered 2013-12-09: 13:00:00 via INTRAVENOUS

## 2013-12-09 MED ORDER — LEVETIRACETAM 250 MG PO TABS
250.0000 mg | ORAL_TABLET | Freq: Two times a day (BID) | ORAL | Status: DC
  Administered 2013-12-10 – 2013-12-12 (×5): 250 mg via ORAL
  Filled 2013-12-09 (×8): qty 1

## 2013-12-09 MED ORDER — NEOSTIGMINE METHYLSULFATE 1 MG/ML IJ SOLN
INTRAMUSCULAR | Status: DC | PRN
  Administered 2013-12-09: 3 mg via INTRAVENOUS

## 2013-12-09 MED ORDER — ALUM & MAG HYDROXIDE-SIMETH 200-200-20 MG/5ML PO SUSP: 15.0000 mL | ORAL | Status: DC | PRN

## 2013-12-09 MED ORDER — METOPROLOL TARTRATE 1 MG/ML IV SOLN: 2.0000 mg | INTRAVENOUS | Status: DC | PRN

## 2013-12-09 MED ORDER — NEOSTIGMINE METHYLSULFATE 1 MG/ML IJ SOLN
INTRAMUSCULAR | Status: AC
  Filled 2013-12-09: qty 10

## 2013-12-09 MED ORDER — ARTIFICIAL TEARS OP OINT
TOPICAL_OINTMENT | OPHTHALMIC | Status: DC | PRN
  Administered 2013-12-09: 1 via OPHTHALMIC

## 2013-12-09 MED ORDER — THROMBIN 20000 UNITS EX SOLR
CUTANEOUS | Status: DC | PRN
  Administered 2013-12-09: 17:00:00 via TOPICAL

## 2013-12-09 MED ORDER — GLYCOPYRROLATE 0.2 MG/ML IJ SOLN
INTRAMUSCULAR | Status: AC
  Filled 2013-12-09: qty 2

## 2013-12-09 MED ORDER — ONDANSETRON HCL 4 MG/2ML IJ SOLN
INTRAMUSCULAR | Status: DC | PRN
  Administered 2013-12-09: 4 mg via INTRAVENOUS

## 2013-12-09 MED ORDER — ONDANSETRON HCL 4 MG/2ML IJ SOLN
INTRAMUSCULAR | Status: AC
  Filled 2013-12-09: qty 2

## 2013-12-09 MED ORDER — GUAIFENESIN-DM 100-10 MG/5ML PO SYRP: 15.0000 mL | ORAL_SOLUTION | ORAL | Status: DC | PRN

## 2013-12-09 MED ORDER — ACETAMINOPHEN 325 MG PO TABS
325.0000 mg | ORAL_TABLET | ORAL | Status: DC | PRN
Start: 2013-12-09 — End: 2013-12-12

## 2013-12-09 MED ORDER — PANTOPRAZOLE SODIUM 40 MG PO TBEC
40.0000 mg | DELAYED_RELEASE_TABLET | Freq: Every day | ORAL | Status: DC
  Administered 2013-12-10 – 2013-12-11 (×2): 40 mg via ORAL
  Filled 2013-12-09 (×2): qty 1

## 2013-12-09 MED ORDER — SODIUM CHLORIDE 0.9 % IV SOLN
INTRAVENOUS | Status: DC
Start: 2013-12-09 — End: 2013-12-09

## 2013-12-09 MED ORDER — DOPAMINE-DEXTROSE 3.2-5 MG/ML-% IV SOLN: 3.0000 ug/kg/min | INTRAVENOUS | Status: DC

## 2013-12-09 MED ORDER — OXYCODONE-ACETAMINOPHEN 5-325 MG PO TABS
2.0000 | ORAL_TABLET | ORAL | Status: DC | PRN
  Administered 2013-12-10 – 2013-12-12 (×7): 2 via ORAL
  Filled 2013-12-09 (×8): qty 2

## 2013-12-09 MED ORDER — LIDOCAINE HCL (CARDIAC) 20 MG/ML IV SOLN
INTRAVENOUS | Status: DC | PRN
  Administered 2013-12-09: 50 mg via INTRAVENOUS

## 2013-12-09 MED ORDER — SODIUM CHLORIDE 0.9 % IV SOLN
INTRAVENOUS | Status: DC
  Administered 2013-12-09: 20:00:00 via INTRAVENOUS

## 2013-12-09 MED ORDER — DOCUSATE SODIUM 100 MG PO CAPS
100.0000 mg | ORAL_CAPSULE | Freq: Every day | ORAL | Status: DC
  Administered 2013-12-10 – 2013-12-11 (×2): 100 mg via ORAL
  Filled 2013-12-09 (×3): qty 1

## 2013-12-09 MED ORDER — HEPARIN SODIUM (PORCINE) 1000 UNIT/ML IJ SOLN
INTRAMUSCULAR | Status: DC | PRN
  Administered 2013-12-09 (×2): 5000 [IU] via INTRAVENOUS

## 2013-12-09 MED ORDER — DEXTROSE 5 % IV SOLN
INTRAVENOUS | Status: DC | PRN
  Administered 2013-12-09: 15:00:00 via INTRAVENOUS

## 2013-12-09 MED ORDER — HEPARIN SODIUM (PORCINE) 5000 UNIT/ML IJ SOLN
INTRAMUSCULAR | Status: DC | PRN
  Administered 2013-12-09: 16:00:00

## 2013-12-09 MED ORDER — PROPOFOL 10 MG/ML IV BOLUS
INTRAVENOUS | Status: AC
  Filled 2013-12-09: qty 20

## 2013-12-09 MED ORDER — DEXTROSE 5 % IV SOLN
1.5000 g | Freq: Two times a day (BID) | INTRAVENOUS | Status: AC
  Administered 2013-12-09 – 2013-12-10 (×2): 1.5 g via INTRAVENOUS
  Filled 2013-12-09 (×2): qty 1.5

## 2013-12-09 MED ORDER — ONDANSETRON HCL 4 MG/2ML IJ SOLN
4.0000 mg | Freq: Four times a day (QID) | INTRAMUSCULAR | Status: DC | PRN
  Administered 2013-12-09: 4 mg via INTRAVENOUS
  Filled 2013-12-09: qty 2

## 2013-12-09 MED ORDER — FENTANYL CITRATE 0.05 MG/ML IJ SOLN
INTRAMUSCULAR | Status: DC | PRN
Start: 2013-12-09 — End: 2013-12-09
  Administered 2013-12-09: 100 ug via INTRAVENOUS
  Administered 2013-12-09 (×3): 50 ug via INTRAVENOUS
  Administered 2013-12-09: 100 ug via INTRAVENOUS

## 2013-12-09 MED ORDER — LACTATED RINGERS IV SOLN
INTRAVENOUS | Status: DC | PRN
  Administered 2013-12-09 (×3): via INTRAVENOUS

## 2013-12-09 MED ORDER — FENTANYL CITRATE 0.05 MG/ML IJ SOLN
INTRAMUSCULAR | Status: AC
  Filled 2013-12-09: qty 5

## 2013-12-09 MED ORDER — PROTAMINE SULFATE 10 MG/ML IV SOLN
INTRAVENOUS | Status: AC
  Filled 2013-12-09: qty 5

## 2013-12-09 MED ORDER — PHENOL 1.4 % MT LIQD: 1.0000 | OROMUCOSAL | Status: DC | PRN

## 2013-12-09 MED ORDER — LABETALOL HCL 5 MG/ML IV SOLN
10.0000 mg | INTRAVENOUS | Status: DC | PRN
  Filled 2013-12-09: qty 4

## 2013-12-09 MED ORDER — OXYCODONE-ACETAMINOPHEN 5-325 MG PO TABS
ORAL_TABLET | ORAL | Status: AC
  Administered 2013-12-09: 1
  Filled 2013-12-09: qty 1

## 2013-12-09 MED ORDER — THROMBIN 20000 UNITS EX SOLR
CUTANEOUS | Status: AC
  Filled 2013-12-09: qty 20000

## 2013-12-09 MED ORDER — HYDROMORPHONE HCL PF 1 MG/ML IJ SOLN
0.5000 mg | INTRAMUSCULAR | Status: DC | PRN
  Administered 2013-12-09 (×2): 0.5 mg via INTRAVENOUS
  Administered 2013-12-10: 1 mg via INTRAVENOUS
  Administered 2013-12-10: 0.5 mg via INTRAVENOUS
  Administered 2013-12-10 – 2013-12-12 (×11): 1 mg via INTRAVENOUS
  Filled 2013-12-09 (×15): qty 1

## 2013-12-09 MED ORDER — 0.9 % SODIUM CHLORIDE (POUR BTL) OPTIME
TOPICAL | Status: DC | PRN
  Administered 2013-12-09: 2000 mL

## 2013-12-09 SURGICAL SUPPLY — 96 items
ADH SKN CLS APL DERMABOND .7 (GAUZE/BANDAGES/DRESSINGS) ×9
BAG ISL DRAPE 18X18 STRL (DRAPES) ×3
BAG ISOLATION DRAPE 18X18 (DRAPES) ×3 IMPLANT
BANDAGE ELASTIC 6 VELCRO ST LF (GAUZE/BANDAGES/DRESSINGS) IMPLANT
BANDAGE ESMARK 6X9 LF (GAUZE/BANDAGES/DRESSINGS) IMPLANT
BANDAGE GAUZE ELAST BULKY 4 IN (GAUZE/BANDAGES/DRESSINGS) IMPLANT
BLADE SAW RECIP 87.9 MT (BLADE) IMPLANT
BNDG CMPR 9X6 STRL LF SNTH (GAUZE/BANDAGES/DRESSINGS)
BNDG COHESIVE 6X5 TAN STRL LF (GAUZE/BANDAGES/DRESSINGS) IMPLANT
BNDG ESMARK 6X9 LF (GAUZE/BANDAGES/DRESSINGS)
CANISTER SUCTION 2500CC (MISCELLANEOUS) ×4 IMPLANT
CANNULA VESSEL 3MM 2 BLNT TIP (CANNULA) ×4 IMPLANT
CANNULA VESSEL W/WING WO/VALVE (CANNULA) IMPLANT
CLIP TI MEDIUM 24 (CLIP) ×8 IMPLANT
CLIP TI MEDIUM 6 (CLIP) IMPLANT
CLIP TI WIDE RED SMALL 24 (CLIP) ×8 IMPLANT
COVER SURGICAL LIGHT HANDLE (MISCELLANEOUS) ×4 IMPLANT
COVER TABLE BACK 60X90 (DRAPES) IMPLANT
CUFF TOURNIQUET SINGLE 18IN (TOURNIQUET CUFF) IMPLANT
CUFF TOURNIQUET SINGLE 24IN (TOURNIQUET CUFF) IMPLANT
CUFF TOURNIQUET SINGLE 34IN LL (TOURNIQUET CUFF) IMPLANT
CUFF TOURNIQUET SINGLE 44IN (TOURNIQUET CUFF) IMPLANT
DECANTER SPIKE VIAL GLASS SM (MISCELLANEOUS) IMPLANT
DERMABOND ADVANCED (GAUZE/BANDAGES/DRESSINGS) ×3
DERMABOND ADVANCED .7 DNX12 (GAUZE/BANDAGES/DRESSINGS) ×9 IMPLANT
DRAIN CHANNEL 19F RND (DRAIN) IMPLANT
DRAIN SNY WOU (WOUND CARE) IMPLANT
DRAPE ISOLATION BAG 18X18 (DRAPES) ×1
DRAPE ORTHO SPLIT 77X108 STRL (DRAPES)
DRAPE PROXIMA HALF (DRAPES) ×4 IMPLANT
DRAPE SURG ORHT 6 SPLT 77X108 (DRAPES) IMPLANT
DRAPE WARM FLUID 44X44 (DRAPE) ×4 IMPLANT
DRAPE X-RAY CASS 24X20 (DRAPES) IMPLANT
DRSG ADAPTIC 3X8 NADH LF (GAUZE/BANDAGES/DRESSINGS) IMPLANT
ELECT REM PT RETURN 9FT ADLT (ELECTROSURGICAL) ×8
ELECTRODE REM PT RTRN 9FT ADLT (ELECTROSURGICAL) ×6 IMPLANT
EVACUATOR SILICONE 100CC (DRAIN) IMPLANT
GLOVE BIO SURGEON STRL SZ 6.5 (GLOVE) ×12 IMPLANT
GLOVE BIO SURGEON STRL SZ7 (GLOVE) ×4 IMPLANT
GLOVE BIO SURGEON STRL SZ7.5 (GLOVE) ×8 IMPLANT
GLOVE BIOGEL PI IND STRL 6.5 (GLOVE) ×6 IMPLANT
GLOVE BIOGEL PI IND STRL 7.5 (GLOVE) ×6 IMPLANT
GLOVE BIOGEL PI INDICATOR 6.5 (GLOVE) ×2
GLOVE BIOGEL PI INDICATOR 7.5 (GLOVE) ×2
GLOVE SS BIOGEL STRL SZ 7 (GLOVE) ×3 IMPLANT
GLOVE SUPERSENSE BIOGEL SZ 7 (GLOVE) ×1
GLOVE SURG SS PI 7.0 STRL IVOR (GLOVE) ×4 IMPLANT
GOWN STRL REUS W/ TWL LRG LVL3 (GOWN DISPOSABLE) ×15 IMPLANT
GOWN STRL REUS W/TWL LRG LVL3 (GOWN DISPOSABLE) ×20
GRAFT HEMASHIELD 8MM (Vascular Products) ×4 IMPLANT
GRAFT PROPATEN W/RING 8X80X70 (Vascular Products) ×4 IMPLANT
GRAFT VASC STRG 30X8KNIT (Vascular Products) ×3 IMPLANT
KIT BASIN OR (CUSTOM PROCEDURE TRAY) ×4 IMPLANT
KIT ROOM TURNOVER OR (KITS) ×4 IMPLANT
LOOP VESSEL MAXI BLUE (MISCELLANEOUS) ×4 IMPLANT
LOOP VESSEL MINI RED (MISCELLANEOUS) ×4 IMPLANT
NS IRRIG 1000ML POUR BTL (IV SOLUTION) ×8 IMPLANT
PACK GENERAL/GYN (CUSTOM PROCEDURE TRAY) IMPLANT
PACK PERIPHERAL VASCULAR (CUSTOM PROCEDURE TRAY) ×4 IMPLANT
PAD ARMBOARD 7.5X6 YLW CONV (MISCELLANEOUS) ×8 IMPLANT
PADDING CAST COTTON 6X4 STRL (CAST SUPPLIES) IMPLANT
SET COLLECT BLD 21X3/4 12 (NEEDLE) IMPLANT
SET COLLECT BLD 21X3/4 12 PB (MISCELLANEOUS) IMPLANT
SPONGE GAUZE 4X4 12PLY (GAUZE/BANDAGES/DRESSINGS) IMPLANT
SPONGE LAP 18X18 X RAY DECT (DISPOSABLE) ×4 IMPLANT
SPONGE SURGIFOAM ABS GEL 100 (HEMOSTASIS) ×4 IMPLANT
STAPLER VISISTAT 35W (STAPLE) IMPLANT
STOCKINETTE IMPERVIOUS LG (DRAPES) ×4 IMPLANT
STOPCOCK 4 WAY LG BORE MALE ST (IV SETS) IMPLANT
SUT ETHILON 3 0 PS 1 (SUTURE) IMPLANT
SUT PROLENE 5 0 C 1 24 (SUTURE) ×8 IMPLANT
SUT PROLENE 6 0 BV (SUTURE) ×4 IMPLANT
SUT PROLENE 6 0 CC (SUTURE) ×20 IMPLANT
SUT PROLENE 7 0 BV 1 (SUTURE) IMPLANT
SUT PROLENE 7 0 BV1 MDA (SUTURE) ×8 IMPLANT
SUT SILK 2 0 (SUTURE) ×3
SUT SILK 2 0 SH (SUTURE) ×4 IMPLANT
SUT SILK 2 0 SH CR/8 (SUTURE) IMPLANT
SUT SILK 2-0 18XBRD TIE 12 (SUTURE) ×3 IMPLANT
SUT SILK 3 0 (SUTURE) ×4
SUT SILK 3-0 18XBRD TIE 12 (SUTURE) ×3 IMPLANT
SUT VIC AB 2-0 CT1 27 (SUTURE)
SUT VIC AB 2-0 CT1 TAPERPNT 27 (SUTURE) IMPLANT
SUT VIC AB 2-0 CTX 36 (SUTURE) ×8 IMPLANT
SUT VIC AB 2-0 SH 18 (SUTURE) ×4 IMPLANT
SUT VIC AB 3-0 SH 27 (SUTURE) ×18
SUT VIC AB 3-0 SH 27X BRD (SUTURE) ×18 IMPLANT
SUT VIC AB 4-0 PS2 27 (SUTURE) IMPLANT
SUT VICRYL 4-0 PS2 18IN ABS (SUTURE) ×12 IMPLANT
TAPE UMBILICAL COTTON 1/8X30 (MISCELLANEOUS) ×4 IMPLANT
TOWEL OR 17X24 6PK STRL BLUE (TOWEL DISPOSABLE) ×8 IMPLANT
TOWEL OR 17X26 10 PK STRL BLUE (TOWEL DISPOSABLE) ×16 IMPLANT
TRAY FOLEY CATH 16FRSI W/METER (SET/KITS/TRAYS/PACK) ×4 IMPLANT
TUBING EXTENTION W/L.L. (IV SETS) IMPLANT
UNDERPAD 30X30 INCONTINENT (UNDERPADS AND DIAPERS) ×4 IMPLANT
WATER STERILE IRR 1000ML POUR (IV SOLUTION) ×4 IMPLANT

## 2013-12-09 NOTE — Progress Notes (Signed)
Pt with DP and PT doppler both feet in PACU.  To 3 S when bed available  Ruta Hinds, MD [facility] [place] Office: [phone] Pager: [phone]

## 2013-12-09 NOTE — Op Note (Signed)
Procedure: Bilateral common femoral endarterectomy, Right to left femoral-femoral bypass 8 mm Dacron, left femoral to above knee popliteal bypass with 6 mm Propaten  Preoperative diagnosis: Ischemia left leg  Postoperative diagnosis: Same  Anesthesia: Gen.  Assistant: Adele Barthel, MD, Gerri Lins PA-C, Samantha Rhyne PA-C  Upper findings: 8 mm Dacron graft, 41mm ring supported Propaten to above knee popliteal  Operative details: After obtaining informed consent, the patient was taken to the operating room. The patient was placed in supine position on the operating room table. After induction of general anesthesia, a Foley catheter was placed. Next the patient was prepped and draped in the usual sterile fashion from the umbilicus to the toes. A longitudinal incision was made in the left groin. The incision was taken through the subcutaneous tissues down to level of the common femoral artery. This was dissected free circumferentially.  There was a large amount of calcific plaque.  The profunda femoris and superficial femoral artery dissected free circumferentially. These were fairly small. The SFA was very calcified and known to be chronically occluded.  The profunda was small but soft on palpation.   Vessel loops were placed around these.   There was a good pulse in the right femoral artery. A longitudinal incision was made in the right groin. The incision was taken through the subcutaneous tissues down to level of the common femoral artery. This was dissected free circumferentially.  There was a large amount of calcific plaque.  The profunda femoris and superficial femoral artery dissected free circumferentially. These were fairly small. The SFA was very calcified at the origin.  The profunda was small but soft on palpation.   Vessel loops were placed around these. Next a subcutaneous tunnel was created over the suprapubic region connecting the left and right groin incisions. An 8 mm Dacron graft was  brought through this. Attention was then turned back to the left leg.  The above knee popliteal artery was exposed via a medial longitudinal incision above the knee.  The sartorius was reflected posteriorly and a the above knee popliteal artery dissected free circumferentially.  It was soft on palpation.  A vessel loop was placed around it.  A subsartorial tunnel was then created connecting the above knee popliteal incision to the groin.  The patient was given 5000 units of intravenous heparin. The patient was given an additional 5000 units of heparin during the course of the case. Next the right common femoral artery was controlled with a Henley clamp. The superficial femoral and profunda femoris arteries were controlled with fine bulldog clamps. A longitudinal opening was made the common femoral artery.  There was a large amount of calcific plaque requiring femoral endarterectomy.  A reasonable endpoint was obtained in the SFA and profunda.  There was a slide stepoff in the profunda so this was tacked with several interrupted 7 0 prolene sutures.  All loose debris was removed from the femoral bed. The new 8 mm Dacron graft was spatulated and sewn end of graft to side of common femoral artery using a running 6 0 prolene suture. Just prior to completion of the anastomosis, it was forebled backbled and thoroughly flushed the anastomosis was secured and clamps released and there was good pulsatile flow in the graft immediately. The suture line was slightly loose and this was repaired with a few interrupted 6 0 prolene sutures. Hemostasis was obtained. Attention was then turned to the left groin. The native common femoral artery was controlled proximally with a henley clamp.  The SFA was controlled with a vessel loop and the profunda with a  fine bulldog clamp. The was also calcific plaque on this side requiring femoral endarterectomy.  Good endpoint was obtained in the profunda the SFA was chronically occluded. The  new 8 mm Dacron graft was cut to length and beveled. This was then sewn end of graft to side of native common femoral artery using a running 6-0 Prolene suture. Just prior to completion of the anastomosis it was forebled backbled and thoroughly flushed. the anastomosis was secured; clamps released; and there was pulsatile flow in the graft immediately. Next the graft was controlled proximally in the groin and the SFA profunda and native common femoral recontrolled.  A longitudinal opening was made in the hood of the fem fem and a 6 mm ring supported propaten graft was beveled and sewn end to side using a running 5 0 prolene. Just prior to completion this was forebled backbled and thoroughly flushed.  The graft was clamped just distal to the anastomosis.  The graft was then brought through the subsartorial tunnel to the above knee popliteal artery.  This was controlled proximally with a henley clamp and distally with a fine bulldog clamp.  The artery was opened longitudinally and the graft cut to length.  This was then sewn end to side to the artery using a running 6 0 prolene suture.  The usual flushing procedures were performed.  Flow was restored to the right foot.  There was a pulse in the distal above knee popliteal artery and good doppler flow.  The foot had been covered due to the extent of the wounds so it was not inspected until the drapes were taken down.  The right foot was pink with brisk cap refill.   The patient was given 50 mg of protamine for heparin reversal. Thrombin and gelfoam was applied then removed.  Hemostasis was obtained. The groin was then closed in multiple layers using running 3 0 Vicryl suture. The skin of both incisions was closed with a 4 0 Vicryl subcuticular stitch. The above knee popliteal incision was closed in similar fashion.  Dermabond was applied to both incisions. The patient tolerated procedure well and there were no complications. The patient was extubated in the operating  room and taken to recovery in stable condition.  Ruta Hinds, MD [facility] Office: [phone] Pager: [phone]

## 2013-12-09 NOTE — H&P (View-Only) (Signed)
[facility] HISTORY AND PHYSICAL   History of Present Illness:  Patient is a 73 y.o. year old male who presents for evaluation of left leg ischemia. The patient is referred by Dr. March Rummage from the from the [facility]. The patient has a chronic history of numbness and tingling from the knee down on the left leg since spine surgery last fall. Over the course of several weeks he has developed multiple ulcerations on his left lower extremity. He denies rest pain in the left foot but does have chronic numbness. He was seen by Dr. March Rummage on January 9 and referred here for further evaluation. He is currently apply Neosporin to the wounds. He denies any prior similar episodes. He currently smokes 5 cigars per day. He had a right carotid endarterectomy for symptomatic carotid stenosis by Dr. Amedeo Plenty in 2007. Other medical problems include hypertension, hyperlipidemia, COPD, prior history of bladder cancer of which are currently stable.  Past Medical History  Diagnosis Date  . Hypertension   . Dyslipidemia   . Cerebrovascular disease     right brain CVA 2007  . Lumbago   . Abnormality of gait 03/03/2013  . CVA (cerebral vascular accident) 2007    r-cva  . COPD (chronic obstructive pulmonary disease)   . Cough productive of clear sputum     TX RECENT SINUS INFECTION   . bladder ca dx'd 11/2009    chemo/xrt comp 02/2010    Past Surgical History  Procedure Laterality Date  . Carotid artery angioplasty  2009  . Back surgery  2010  . Bladder cancer    . Laminectomy  09/08/2013    L 2 L3 L4 L5       Dr Luiz Ochoa    Social History History  Substance Use Topics  . Smoking status: Former Smoker    Types: Cigarettes    Quit date: 11/12/2005  . Smokeless tobacco: Never Used  . Alcohol Use: 12.6 oz/week    21 Cans of beer per week    Family History Family History  Problem Relation Age of Onset  . Stroke Mother   . Cancer Father   . Dementia Father   . Cancer  Sister     Allergies  Allergies  Allergen Reactions  . Morphine And Related Itching    Lasts about 1 week beyond use.  Itching to entire body.     Current Outpatient Prescriptions  Medication Sig Dispense Refill  . amLODipine (NORVASC) 5 MG tablet Take 5 mg by mouth daily.      Marland Kitchen gabapentin (NEURONTIN) 300 MG capsule Take 1 capsule (300 mg total) by mouth 2 (two) times daily.  60 capsule  5  . lisinopril (PRINIVIL,ZESTRIL) 20 MG tablet Take 20 mg by mouth daily.      Marland Kitchen oxyCODONE-acetaminophen (PERCOCET/ROXICET) 5-325 MG per tablet Take 1-2 tablets by mouth every 4 (four) hours as needed for pain.  51 tablet  0  . Oxymorphone HCl, Crush Resist, (OPANA ER, CRUSH RESISTANT,) 20 MG T12A Take 1 tablet by mouth every 12 (twelve) hours.      . simvastatin (ZOCOR) 20 MG tablet Take 20 mg by mouth daily.      Marland Kitchen acetaminophen (TYLENOL) 500 MG tablet Take 1,000 mg by mouth every 6 (six) hours as needed for pain.      . cyclobenzaprine (FLEXERIL) 10 MG tablet Take 1 tablet (10 mg total) by mouth 3 (three) times daily as needed for muscle spasms.  Matheny  tablet  3   No current facility-administered medications for this visit.    ROS:   General:  No weight loss, Fever, chills  HEENT: No recent headaches, no nasal bleeding, no visual changes, no sore throat, he has had a recent cough, cold.  Neurologic: No dizziness, blackouts, seizures. No recent symptoms of stroke or mini- stroke. No recent episodes of slurred speech, or temporary blindness.  Cardiac: No recent episodes of chest pain/pressure, no shortness of breath at rest.  + shortness of breath with exertion.  Denies history of atrial fibrillation or irregular heartbeat  Vascular: No history of rest pain in feet.  No history of claudication.  No history of non-healing ulcer, No history of DVT   Pulmonary: No home oxygen, no productive cough, no hemoptysis,  No asthma or wheezing  Musculoskeletal:  [ ]  Arthritis, [x ] Low back pain,  [ ]   Joint pain  Hematologic:No history of hypercoagulable state.  No history of easy bleeding.  No history of anemia  Gastrointestinal: No hematochezia or melena,  No gastroesophageal reflux, no trouble swallowing  Urinary: [ ]  chronic Kidney disease, [ ]  on HD - [ ]  MWF or [ ]  TTHS, [ ]  Burning with urination, [ ]  Frequent urination, [ ]  Difficulty urinating;   Skin: No rashes  Psychological: No history of anxiety,  No history of depression   Physical Examination  Filed Vitals:   12/03/13 1539  BP: 157/68  Pulse: 101  Height: 5\' 4"  (1.626 m)  Weight: 103 lb 8 oz (46.947 kg)  SpO2: 97%    Body mass index is 17.76 kg/(m^2).  General:  Alert and oriented, no acute distress HEENT: Normal Neck: No bruit or JVD, well-healed right neck scar Pulmonary: Clear to auscultation bilaterally Cardiac: Regular Rate and Rhythm without murmur Abdomen: Soft, non-tender, non-distended, no mass Skin: No rash, full-thickness loss of epidermis multiple ulcerations left leg scattered ulcerations on the lateral aspect of the calf dorsal foot and pretibial area some of these are as deep as 1 cm, left lower extremity has a purplish and ruborous when dependent Extremity Pulses:  2+ radial, brachial, absent left femoral, 1+ right femoral absent popliteal dorsalis pedis, posterior tibial pulses bilaterally Musculoskeletal: No deformity or edema, 5 flexion contracture left knee  Neurologic: Upper and lower extremity motor 5/5 and symmetric  DATA:  Patient had bilateral ABIs performed today. 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 Most likely this represents acute worsening of chronic peripheral arterial disease.   PLAN:  Aortogram bilateral lower extremity runoff possible intervention in my partner Dr.  Scot Dock on January 26. If he requires aortoiliac reconstruction most likely we will do this in the operating room later during the week. Patient recognizes he is at high risk of limb loss.  Ruta Hinds, MD [facility] Office: [phone] Pager: [phone]

## 2013-12-09 NOTE — Preoperative (Signed)
Beta Blockers   Reason not to administer Beta Blockers:Not Applicable 

## 2013-12-09 NOTE — Anesthesia Postprocedure Evaluation (Signed)
Anesthesia Post Note  Patient: AVIGDOR DOLLAR  Procedure(s) Performed: Procedure(s) (LRB): BYPASS GRAFT FEMORAL-FEMORAL ARTERY WITH BILATERAL ENDARTERECTOMY  (N/A) BYPASS GRAFT FEMORAL-POPLITEAL ARTERY - LEFT  (Left)  Anesthesia type: General  Patient location: PACU  Post pain: Pain level controlled and Adequate analgesia  Post assessment: Post-op Vital signs reviewed, Patient's Cardiovascular Status Stable, Respiratory Function Stable, Patent Airway and Pain level controlled  Last Vitals:  Filed Vitals:   12/09/13 1845  BP: 144/57  Pulse: 69  Temp:   Resp: 13    Post vital signs: Reviewed and stable  Level of consciousness: awake, alert  and oriented  Complications: No apparent anesthesia complications

## 2013-12-09 NOTE — Progress Notes (Signed)
Pt transferred from PACU, moved over to bed with full assistance. Pt VVS, complaining of 8/10 pain to right groin, pain meds given. Doppler pulses to bilateral lower ext found. Groin sites both at a level zero. Pt family already updated by MD and gone home. Will continue to monitor.

## 2013-12-09 NOTE — Interval H&P Note (Signed)
History and Physical Interval Note:  12/09/2013 2:25 PM  [name]  has presented today for surgery, with the diagnosis of Peripheral Vascular Disease with ischemic rest pain, and nonhealing ulcers  The various methods of treatment have been discussed with the patient and family. After consideration of risks, benefits and other options for treatment, the patient has consented to  Procedure(s): BYPASS GRAFT FEMORAL-FEMORAL ARTERY (N/A) BYPASS GRAFT FEMORAL-POPLITEAL ARTERY - LEFT  (Left) POSSIBLE LEFT BELOW KNEE VERSUS ABOVE KNEE AMPUTATION (Left) as a surgical intervention .  At this point will do fem fem and fem above knee pop.  Pt advised he is still high risk for amputation.  Other possible risks cardiac events infection graft thrombosis bleeding.  He wishes to proceed.  The patient's history has been reviewed, patient examined, no change in status, stable for surgery.  I have reviewed the patient's chart and labs.  Questions were answered to the patient's satisfaction.     Thomas Ferrell,Thomas Ferrell

## 2013-12-09 NOTE — Anesthesia Procedure Notes (Signed)
Procedure Name: Intubation Date/Time: 12/09/2013 3:12 PM Performed by: Williemae Area B Pre-anesthesia Checklist: Patient identified, Emergency Drugs available, Suction available and Patient being monitored Patient Re-evaluated:Patient Re-evaluated prior to inductionOxygen Delivery Method: Circle system utilized Preoxygenation: Pre-oxygenation with 100% oxygen Intubation Type: IV induction Ventilation: Mask ventilation with difficulty Laryngoscope Size: Mac and 3 Grade View: Grade II Tube type: Oral Tube size: 7.5 mm Number of attempts: 1 Airway Equipment and Method: Stylet Placement Confirmation: ETT inserted through vocal cords under direct vision,  breath sounds checked- equal and bilateral and positive ETCO2 Secured at: 21 (cm at gum) cm Tube secured with: Tape Dental Injury: Teeth and Oropharynx as per pre-operative assessment  Comments: Inability to mask effectively due to beard.

## 2013-12-09 NOTE — Transfer of Care (Signed)
Immediate Anesthesia Transfer of Care Note  Patient: Thomas Ferrell  Procedure(s) Performed: Procedure(s): BYPASS GRAFT FEMORAL-FEMORAL ARTERY WITH BILATERAL ENDARTERECTOMY  (N/A) BYPASS GRAFT FEMORAL-POPLITEAL ARTERY - LEFT  (Left)  Patient Location: PACU  Anesthesia Type:General  Level of Consciousness: awake, alert  and oriented  Airway & Oxygen Therapy: Patient Spontanous Breathing and Patient connected to nasal cannula oxygen  Post-op Assessment: Report given to PACU RN and Post -op Vital signs reviewed and stable  Post vital signs: Reviewed and stable  Complications: No apparent anesthesia complications

## 2013-12-09 NOTE — Anesthesia Preprocedure Evaluation (Addendum)
Anesthesia Evaluation  Patient identified by MRN, date of birth, ID band Patient awake    Reviewed: Allergy & Precautions, H&P , NPO status , Patient's Chart, lab work & pertinent test results, reviewed documented beta blocker date and time   Airway Mallampati: I TM Distance: >3 FB Neck ROM: Full    Dental  (+) Edentulous Upper and Edentulous Lower   Pulmonary COPDformer smoker,          Cardiovascular hypertension, Pt. on medications + Peripheral Vascular Disease     Neuro/Psych CVA, Residual Symptoms    GI/Hepatic   Endo/Other    Renal/GU      Musculoskeletal   Abdominal   Peds  Hematology   Anesthesia Other Findings Assessment done at 1450 Full Beard  Reproductive/Obstetrics                          Anesthesia Physical Anesthesia Plan  ASA: III  Anesthesia Plan: General   Post-op Pain Management:    Induction: Intravenous  Airway Management Planned: Oral ETT  Additional Equipment:   Intra-op Plan:   Post-operative Plan: Extubation in OR  Informed Consent: I have reviewed the patients History and Physical, chart, labs and discussed the procedure including the risks, benefits and alternatives for the proposed anesthesia with the patient or authorized representative who has indicated his/her understanding and acceptance.     Plan Discussed with:   Anesthesia Plan Comments:         Anesthesia Quick Evaluation

## 2013-12-09 NOTE — Telephone Encounter (Addendum)
Message copied by Gena Fray on Wed Dec 09, 2013  3:51 PM ------      Message from: Denman George      Created: Mon Dec 07, 2013  1:54 PM      Regarding: FW: charge and follow up                   ----- Message -----         From: Angelia Mould, MD         Sent: 12/07/2013  10:57 AM           To: Vvs Charge Pool      Subject: charge and follow up                                     PROCEDURE:       1. Ultrasound-guided access to the right common femoral artery      2. Aortogram with bilateral iliac arteriogram and bilateral lower extremity runoff      3. PTA and stenting of right common iliac artery            SURGEON: Judeth Cornfield. Scot Dock, MD, FACS            He needs a follow up appointment with [name] Thursday if possible. CD ------  12/09/13: pt is still admitted at this time. He will not be available for appt with [facility] tomorrow.

## 2013-12-10 ENCOUNTER — Encounter (HOSPITAL_COMMUNITY): Payer: Self-pay | Admitting: Vascular Surgery

## 2013-12-10 LAB — CBC
HEMATOCRIT: 28.3 % — AB (ref 39.0–52.0)
HEMOGLOBIN: 9.4 g/dL — AB (ref 13.0–17.0)
MCH: 28.8 pg (ref 26.0–34.0)
MCHC: 33.2 g/dL (ref 30.0–36.0)
MCV: 86.8 fL (ref 78.0–100.0)
PLATELETS: 300 10*3/uL (ref 150–400)
RBC: 3.26 MIL/uL — AB (ref 4.22–5.81)
RDW: 15.4 % (ref 11.5–15.5)
WBC: 12.7 10*3/uL — AB (ref 4.0–10.5)

## 2013-12-10 LAB — BASIC METABOLIC PANEL
BUN: 9 mg/dL (ref 6–23)
CHLORIDE: 98 meq/L (ref 96–112)
CO2: 27 meq/L (ref 19–32)
CREATININE: 0.57 mg/dL (ref 0.50–1.35)
Calcium: 8.1 mg/dL — ABNORMAL LOW (ref 8.4–10.5)
GFR calc Af Amer: >90 mL/min (ref >90)
GFR calc non Af Amer: >90 mL/min (ref >90)
GLUCOSE: 114 mg/dL — AB (ref 70–99)
Potassium: 3.7 mEq/L (ref 3.7–5.3)
Sodium: 136 mEq/L — ABNORMAL LOW (ref 137–147)

## 2013-12-10 MED ORDER — ENSURE COMPLETE PO LIQD
237.0000 mL | Freq: Three times a day (TID) | ORAL | Status: DC
  Administered 2013-12-11 – 2013-12-12 (×5): 237 mL via ORAL

## 2013-12-10 MED ORDER — ADULT MULTIVITAMIN W/MINERALS CH
1.0000 | ORAL_TABLET | Freq: Every day | ORAL | Status: DC
  Administered 2013-12-11 – 2013-12-12 (×2): 1 via ORAL
  Filled 2013-12-10 (×2): qty 1

## 2013-12-10 NOTE — Progress Notes (Signed)
INITIAL NUTRITION ASSESSMENT  DOCUMENTATION CODES Per approved criteria  -Severe malnutrition in the context of chronic illness -Underweight   INTERVENTION: Ensure Complete po TID, each supplement provides 350 kcal and 13 grams of protein. Prefers chocolate or vanilla. Add MVI daily. RD to continue to follow nutrition care plan.  NUTRITION DIAGNOSIS: Increased nutrient needs related to post-op healing and need for weight gain as evidenced by estimated needs.   Goal: Intake to meet >90% of estimated nutrition needs.  Monitor:  weight trends, lab trends, I/O's, PO intake, supplement tolerance  Reason for Assessment: Malnutrition Screening Tool  73 y.o. male  Admitting Dx: PVD  ASSESSMENT: PMHx significant for HTN, CVA, bladder CA. Admitted with ischemic pain and non-healing ulcers. Work-up reveals PVD.  Patient is s/p the following on 1/28: BYPASS GRAFT FEMORAL-FEMORAL ARTERY BYPASS GRAFT FEMORAL-POPLITEAL ARTERY - LEFT  BILATERAL COMMON FEMORAL ENDARTECTOMY  PT and OT evals pending; CIR consult pending.  Pt placed on a Regular diet. Eating 100% of meals. Pt reports that his usual body weight is 108 - 109 lb and that over the past 6-8 weeks, he has had a decline in oral intake, current weight is down to 99 lb. He states that one of the doctors here has told him that if he doesn't gain weight soon he may die. Pt is aware that he is underweight. States that he was drinking 3-4 Ensures daily at home. Will order for while pt is admitted.  Nutrition Focused Physical Exam:  Subcutaneous Fat:  Orbital Region: WNL Upper Arm Region: n/a Thoracic and Lumbar Region: n/a  Muscle:  Temple Region: moderate depletion Clavicle Bone Region: moderate depletion Clavicle and Acromion Bone Region: moderate depletion Scapular Bone Region: n/a Dorsal Hand: WNL Patellar Region: severe depletion Anterior Thigh Region: severe depletion Posterior Calf Region: severe depletion  Edema:  n/a  Pt meets criteria for severe MALNUTRITION in the context of chronic illness as evidenced by 9% wt loss x 6-8 weeks, intake of <75% x at least 1 month, and severe muscle mass depletoin.   Height: Ht Readings from Last 1 Encounters:  12/09/13 5\' 3"  (1.6 m)    Weight: Wt Readings from Last 1 Encounters:  12/09/13 99 lb 10.4 oz (45.2 kg)    Ideal Body Weight: 115 lb  % Ideal Body Weight: 86%  Wt Readings from Last 10 Encounters:  12/09/13 99 lb 10.4 oz (45.2 kg)  12/09/13 99 lb 10.4 oz (45.2 kg)  12/07/13 103 lb (46.72 kg)  12/07/13 103 lb (46.72 kg)  12/03/13 103 lb 8 oz (46.947 kg)  09/08/13 111 lb (50.349 kg)  09/08/13 111 lb (50.349 kg)  09/01/13 111 lb 1.6 oz (50.395 kg)  03/03/13 107 lb (48.535 kg)    Usual Body Weight: 108 - 109 lb  % Usual Body Weight: 91%  BMI:  Body mass index is 17.66 kg/(m^2). Underweight  Estimated Nutritional Needs: Kcal: 1350 - 1500 Protein: 54 - 65 g Fluid: 1.4 - 1.6 liters  Skin:  Stage I medial buttocks Incision to thigh and groin  Diet Order: General  EDUCATION NEEDS: -No education needs identified at this time   Intake/Output Summary (Last 24 hours) at 12/10/13 1357 Last data filed at 12/10/13 0705  Gross per 24 hour  Intake   3440 ml  Output    810 ml  Net   2630 ml    Last BM: 1/28  Labs:   Recent Labs Lab 12/07/13 0716 12/09/13 1218 12/10/13 0247  NA 133* 136* 136*  K 3.3* 3.1* 3.7  CL 96 96 98  CO2  --  24 27  BUN 10 10 9   CREATININE 0.50 0.48* 0.57  CALCIUM  --  8.3* 8.1*  GLUCOSE 97 123* 114*    CBG (last 3)  No results found for this basename: GLUCAP,  in the last 72 hours  Scheduled Meds: . amLODipine  5 mg Oral Daily  . docusate sodium  100 mg Oral Daily  . enoxaparin (LOVENOX) injection  40 mg Subcutaneous Q24H  . levETIRAcetam  250 mg Oral BID  . lisinopril  20 mg Oral Daily  . pantoprazole  40 mg Oral Daily  . simvastatin  20 mg Oral Daily    Continuous Infusions:   Past  Medical History  Diagnosis Date  . Hypertension   . Dyslipidemia   . Cerebrovascular disease     right brain CVA 2007  . Lumbago   . Abnormality of gait 03/03/2013  . CVA (cerebral vascular accident) 2007    r-cva  . COPD (chronic obstructive pulmonary disease)   . Cough productive of clear sputum     TX RECENT SINUS INFECTION   . bladder ca dx'd 11/2009    chemo/xrt comp 02/2010    Past Surgical History  Procedure Laterality Date  . Carotid artery angioplasty  2009  . Back surgery  2010  . Bladder cancer    . Laminectomy  09/08/2013    L 2 L3 L4 L5       Dr Gustavo Lah MS, RD, LDN Pager: [phone] After-hours pager: [phone]

## 2013-12-10 NOTE — Progress Notes (Signed)
Occupational Therapy Evaluation Patient Details Name: Thomas Ferrell MRN: [id number] DOB: 1941-03-03 Today's Date: 12/10/2013 Time: 7035-0093 OT Time Calculation (min): 26 min  OT Assessment / Plan / Recommendation History of present illness Pt is s/p BYPASS GRAFT FEMORAL-POPLITEAL ARTERY - LEFT  BYPASS GRAFT FEMORAL-POPLITEAL ARTERY - LEFT     Clinical Impression   PTA, pt lived at home with wife, who pt statets assisted with ADL as needed. Pt making steady progress with ambulation. Will see tomorrow to assess use of AE for ADL and review tub bench transfers. Pt has all DME needed for D/C.     OT Assessment  Patient needs continued OT Services    Follow Up Recommendations  No OT follow up;Supervision/Assistance - 24 hour    Barriers to Discharge      Equipment Recommendations  None recommended by OT    Recommendations for Other Services    Frequency  Min 2X/week    Precautions / Restrictions Precautions Precautions: Fall Restrictions Weight Bearing Restrictions: No   Pertinent Vitals/Pain Min c/o LLE with movement    ADL  Upper Body Bathing: Set up Where Assessed - Upper Body Bathing: Unsupported sitting Lower Body Bathing: Minimal assistance Where Assessed - Lower Body Bathing: Supported sit to stand Upper Body Dressing: Set up Where Assessed - Upper Body Dressing: Unsupported sitting Lower Body Dressing: Minimal assistance Where Assessed - Lower Body Dressing: Supported sit to stand Toilet Transfer: Magazine features editor Method: Sit to Loss adjuster, chartered: Regular height toilet Toileting - Clothing Manipulation and Hygiene: Supervision/safety Where Assessed - Best boy and Hygiene: Sit to stand from 3-in-1 or toilet Equipment Used: Gait belt;Rolling walker Transfers/Ambulation Related to ADLs: min guard sit/stand min A ambalation ADL Comments: May benefit from AE    OT Diagnosis: Generalized weakness;Acute pain  OT Problem  List: Decreased activity tolerance;Decreased knowledge of use of DME or AE;Pain OT Treatment Interventions: Self-care/ADL training;DME and/or AE instruction;Patient/family education;Therapeutic activities   OT Goals(Current goals can be found in the care plan section) Acute Rehab OT Goals Patient Stated Goal: to go home to wife OT Goal Formulation: With patient Time For Goal Achievement: 12/17/13 Potential to Achieve Goals: Good  Visit Information  Last OT Received On: 12/10/13 History of Present Illness: Pt is s/p BYPASS GRAFT FEMORAL-POPLITEAL ARTERY - LEFT  BYPASS GRAFT FEMORAL-POPLITEAL ARTERY - LEFT         Prior [place] expects to be discharged to:: Private residence Living Arrangements: Spouse/significant other Available Help at Discharge: Family Type of Home: House Home Access: Stairs to enter Technical brewer of Steps: 1 Entrance Stairs-Rails: None Home Layout: One level Home Equipment: Environmental consultant - 2 wheels;Shower seat;Hand held shower head Additional Comments: Pt reports his wife was always with him; would help with ADLs as needed Prior Function Level of Independence: Needs assistance Gait / Transfers Assistance Needed: pt reports he would ambulate with RW as needed  ADL's / Homemaking Assistance Needed: pt states wife would be there if he needed it Communication Communication: No difficulties Dominant Hand: Right         Vision/Perception Vision - History Baseline Vision: Wears glasses all the time   Cognition  Cognition Arousal/Alertness: Awake/alert Behavior During Therapy: WFL for tasks assessed/performed Overall Cognitive Status: Within Functional Limits for tasks assessed    Extremity/Trunk Assessment Lower Extremity Assessment LLE Deficits / Details: pt unable to obtain full extension; 8 degree flexion contracture; LLE: Unable to fully assess due to  pain Cervical / Trunk Assessment Cervical / Trunk  Assessment: Normal     Mobility Bed Mobility Overal bed mobility: Modified Independent Transfers Overall transfer level: Needs assistance Equipment used: Rolling walker (2 wheeled) Transfers: Sit to/from Stand Sit to Stand: Min guard General transfer comment: min guard to steady; cues for hand placement and sequencing with transfers and with RW     Exercise     Balance Balance Overall balance assessment: Needs assistance Sitting-balance support: Feet supported Sitting balance-Leahy Scale: Normal Standing balance-Leahy Scale: Fair General Comments General comments (skin integrity, edema, etc.): LLE wounds   End of Session OT - End of Session Equipment Utilized During Treatment: Gait belt;Rolling walker Activity Tolerance: Patient tolerated treatment well Patient left: in chair;with call bell/phone within reach Nurse Communication: Mobility status  GO     Clarita Mcelvain,HILLARY 12/10/2013, 5:53 PM [facility], OTR/L  [phone] 12/10/2013

## 2013-12-10 NOTE — Progress Notes (Signed)
Utilization review completed.  

## 2013-12-10 NOTE — Progress Notes (Signed)
Report called to RN on 2W VSS, family aware of new room number

## 2013-12-10 NOTE — Progress Notes (Signed)
Physical medicine and rehabilitation consult requested. Will await formal physical and occupational therapy evaluations to establish needs and followup with rehabilitation consult accordingly

## 2013-12-10 NOTE — Progress Notes (Signed)
Patient voided 200cc on own. Will continue to monitor I&O.   Domingo Dimes RN

## 2013-12-10 NOTE — Evaluation (Signed)
Physical Therapy Evaluation Patient Details Name: Thomas Ferrell MRN: [id number] DOB: [birth year] Today's Date: 12/10/2013 Time: 7829-5621 PT Time Calculation (min): 15 min  PT Assessment / Plan / Recommendation History of Present Illness     Patient is a 73 y.o. year old male who presents for evaluation of left leg ischemia. The patient is referred by Dr. March Rummage from the from the [facility]. The patient has a chronic history of numbness and tingling from the knee down on the left leg since spine surgery last fall. Over the course of several weeks he has developed multiple ulcerations on his left lower extremity. He denies rest pain in the left foot but does have chronic numbness. He was seen by Dr. March Rummage on January 9 and referred here for further evaluation. He is currently apply Neosporin to the wounds. He denies any prior similar episodes. He currently smokes 5 cigars per day. He had a right carotid endarterectomy for symptomatic carotid stenosis by Dr. Amedeo Plenty in 2007. Other medical problems include hypertension, hyperlipidemia, COPD, prior history of bladder cancer of which are currently stable. Pt is s/p BYPASS GRAFT FEMORAL-POPLITEAL ARTERY - LEFT  BYPASS GRAFT FEMORAL-POPLITEAL ARTERY - LEFT   Clinical Impression  Pt adm due to the above. Presents with limitations in functional mobility secondary to deficits indicated below. Pt with decreased ability to WB through Lt LE due to pain, which is causing decreased balance and independence in mobility. Pt will benefit from skilled acute PT to address deficits indicated below and increase independence in mobility prior to returning home with wife. Will recommend HHPT at this time to ensure safe transition home. Recommend pt ambulate with RW at this time.     PT Assessment  Patient needs continued PT services    Follow Up Recommendations  Home health PT;Supervision/Assistance - 24 hour    Does the patient have the potential to tolerate  intense rehabilitation      Barriers to Discharge        Equipment Recommendations  None recommended by PT    Recommendations for Other Services OT consult   Frequency Min 3X/week    Precautions / Restrictions Precautions Precautions: Fall Restrictions Weight Bearing Restrictions: No   Pertinent Vitals/Pain 3/10; patient repositioned for comfort with Lt LE elevated      Mobility  Bed Mobility Overal bed mobility: Needs Assistance Bed Mobility: Supine to Sit Supine to sit: Supervision;HOB elevated General bed mobility comments: pt requires increased time due to pain in Lt Le; no physical (A) needed Transfers Overall transfer level: Needs assistance Equipment used: Rolling walker (2 wheeled) Transfers: Sit to/from Stand Sit to Stand: Min guard General transfer comment: min guard to steady; cues for hand placement and sequencing with transfers and with RW Ambulation/Gait Ambulation/Gait assistance: Min assist Ambulation Distance (Feet): 45 Feet Assistive device: Rolling walker (2 wheeled) Gait Pattern/deviations: Step-through pattern;Decreased stride length;Narrow base of support Gait velocity: decreased Gait velocity interpretation: Below normal speed for age/gender General Gait Details: pt fatigues quickly; min (A) to maintain balance and manage RW at times; cues for sequencing          PT Diagnosis: Abnormality of gait;Generalized weakness;Acute pain  PT Problem List: Decreased strength;Decreased activity tolerance;Decreased balance;Decreased mobility;Decreased knowledge of use of DME;Decreased safety awareness;Pain PT Treatment Interventions: DME instruction;Gait training;Stair training;Therapeutic activities;Functional mobility training;Therapeutic exercise;Balance training;Patient/family education;Neuromuscular re-education     PT Goals(Current goals can be found in the care plan section) Acute Rehab PT Goals Patient Stated Goal: to go home  to wife PT Goal  Formulation: With patient Time For Goal Achievement: 12/24/13 Potential to Achieve Goals: Good  Visit Information  Last PT Received On: 12/10/13 History of Present Illness: Pt is s/p BYPASS GRAFT FEMORAL-POPLITEAL ARTERY - LEFT  BYPASS GRAFT FEMORAL-POPLITEAL ARTERY - LEFT         Prior [place] expects to be discharged to:: Private residence Living Arrangements: Spouse/significant other Available Help at Discharge: Family Type of Home: House Home Access: Stairs to enter Technical brewer of Steps: 1 Entrance Stairs-Rails: None Home Layout: One level Home Equipment: Environmental consultant - 2 wheels;Shower seat Additional Comments: Pt reports his wife was always with him; would help with ADLs as needed Prior Function Level of Independence: Needs assistance Gait / Transfers Assistance Needed: pt reports he would ambulate with RW as needed  ADL's / Homemaking Assistance Needed: reports his wife is on stand by when he takes a bath/shower; reports she helps him as needed  Communication Communication: No difficulties Dominant Hand: Right    Cognition  Cognition Arousal/Alertness: Awake/alert Behavior During Therapy: WFL for tasks assessed/performed Overall Cognitive Status: Within Functional Limits for tasks assessed    Extremity/Trunk Assessment Upper Extremity Assessment Upper Extremity Assessment: Defer to OT evaluation Lower Extremity Assessment Lower Extremity Assessment: LLE deficits/detail LLE Deficits / Details: pt unable to obtain full extension; 8 degree flexion contracture; LLE: Unable to fully assess due to pain Cervical / Trunk Assessment Cervical / Trunk Assessment: Normal   Balance Balance Overall balance assessment: Needs assistance Sitting-balance support: Feet supported;Single extremity supported Sitting balance-Leahy Scale: Good Standing balance support: During functional activity;Bilateral upper extremity supported Standing  balance-Leahy Scale: Fair Standing balance comment: bil UE supported by RW  End of Session PT - End of Session Equipment Utilized During Treatment: Gait belt Activity Tolerance: Patient tolerated treatment well Patient left: in chair;with call bell/phone within reach Nurse Communication: Mobility status  GP     Gustavus Bryant, [place] 12/10/2013, 3:01 PM

## 2013-12-10 NOTE — Progress Notes (Signed)
Physical medicine and rehabilitation consult requested with chart reviewed. Patient status post right femoral-femoral bypass, bilateral common femoral endarterectomy 12/09/2013. Physical therapy evaluation completed 12/10/2013 and patient progressing nicely with recommendations of home health therapies and wife can assist as needed. Will hold on formal rehabilitation consult with recommendations of home health therapies

## 2013-12-10 NOTE — Progress Notes (Addendum)
[facility] Progress Note  12/10/2013 7:35 AM 1 Day Post-Op  Subjective:  Pain is well controlled.  Afebrile VSS 92% RA  Filed Vitals:   12/10/13 0705  BP: 137/51  Pulse: 93  Temp:   Resp: 17    Physical Exam: Incisions:  Bilateral groin incisions are c/d/i as well as left AK incision. Extremities:  Palpable fem fem graft pulse.  + brisk PT/DP left foot.  Right foot warm with + PT signal. Multiple wounds LLE/foot  CBC    Component Value Date/Time   WBC 12.7* 12/10/2013 0247   WBC 5.5 07/18/2010 1449   RBC 3.26* 12/10/2013 0247   RBC 3.78* 07/18/2010 1449   RBC 3.16* 10/15/2009 1210   HGB 9.4* 12/10/2013 0247   HGB 13.3 07/18/2010 1449   HCT 28.3* 12/10/2013 0247   HCT 37.8* 07/18/2010 1449   PLT 300 12/10/2013 0247   PLT 190 07/18/2010 1449   MCV 86.8 12/10/2013 0247   MCV 100.0* 07/18/2010 1449   MCH 28.8 12/10/2013 0247   MCH 35.3* 07/18/2010 1449   MCHC 33.2 12/10/2013 0247   MCHC 35.3 07/18/2010 1449   RDW 15.4 12/10/2013 0247   RDW 12.9 07/18/2010 1449   LYMPHSABS 0.8* 07/18/2010 1449   LYMPHSABS 0.4* 10/15/2009 0530   MONOABS 0.6 07/18/2010 1449   MONOABS 0.5 10/15/2009 0530   EOSABS 0.0 07/18/2010 1449   EOSABS 0.0 10/15/2009 0530   BASOSABS 0.0 07/18/2010 1449   BASOSABS 0.0 10/15/2009 0530    BMET    Component Value Date/Time   NA 136* 12/10/2013 0247   K 3.7 12/10/2013 0247   CL 98 12/10/2013 0247   CO2 27 12/10/2013 0247   GLUCOSE 114* 12/10/2013 0247   BUN 9 12/10/2013 0247   CREATININE 0.57 12/10/2013 0247   CALCIUM 8.1* 12/10/2013 0247   GFRNONAA >90 12/10/2013 0247   GFRAA >90 12/10/2013 0247    INR    Component Value Date/Time   INR 1.14 12/09/2013 1321     Intake/Output Summary (Last 24 hours) at 12/10/13 0735 Last data filed at 12/10/13 0600  Gross per 24 hour  Intake   3200 ml  Output    810 ml  Net   2390 ml     Assessment:  73 y.o. male is s/p:  Bilateral common femoral endarterectomy, Right to left femoral-femoral bypass 8 mm Dacron, left  femoral to above knee popliteal bypass with 6 mm Propaten  1 Day Post-Op  Plan: -pt doing well this am with good doppler signals in feet. -ABI's this am -stage I sacral decub-RN to place Allyvn dressing to sacrum  -increase mobilization -foley out this am-pt needs to void -DVT prophylaxis:  Will start Lovenox today -transfer to [place], PA-C [facility] [phone] 12/10/2013 7:35 AM   Exam details as above Needs to walk Will need PT to work with left knee contracture Severe protein calorie malnutrition supplement Transfer 2 W Rehab vs home next few days Much improved perfusion to left foot  Ruta Hinds, MD [facility] [place]: [phone] Pager: [phone]

## 2013-12-11 DIAGNOSIS — Z48812 Encounter for surgical aftercare following surgery on the circulatory system: Secondary | ICD-10-CM

## 2013-12-11 MED ORDER — OXYCODONE-ACETAMINOPHEN 5-325 MG PO TABS: 2.0000 | ORAL_TABLET | ORAL | Status: DC | PRN

## 2013-12-11 MED ORDER — ENSURE COMPLETE PO LIQD: 237.0000 mL | Freq: Three times a day (TID) | ORAL | Status: DC

## 2013-12-11 NOTE — Progress Notes (Signed)
PT Cancellation Note  Patient Details Name: Thomas Ferrell MRN: [id number] DOB: December 10, 1940   Cancelled Treatment:    Reason Eval/Treat Not Completed: Pain limiting ability to participate. Attempted second time to work on mobility and ambulation with pt and he again refused due to pain and fatigue. "I'm 73 yrs old. I'm not a Spring chicken and I'm going to rest." Educated pt on benefits of activity and encouraged him to participate with nursing after he rests. Spoke with Chester Holstein, RN re: above.   [name] 12/11/2013, 3:22 PM Pager [phone]

## 2013-12-11 NOTE — Progress Notes (Addendum)
[facility] [place]  Subjective  - Doing well overall.   Objective 126/43 79 98.3 F (36.8 C) (Oral) 18 89%  Intake/Output Summary (Last 24 hours) at 12/11/13 1057 Last data filed at 12/11/13 0900  Gross per 24 hour  Intake    480 ml  Output    796 ml  Net   -316 ml    Groin and above knee incisions clean and dry Doppler DP signal Left leg wounds black eschar without change  Assessment/Planning: POD #2 Bilateral common femoral endarterectomy, Right to left femoral-femoral bypass 8 mm Dacron, left femoral to above knee popliteal bypass with 6 mm Propaten Lovenox for DVT prevention Left lower leg wounds open to air.   Laurence Slate [facility] 12/11/2013 10:57 AM --  Laboratory Lab Results:  Recent Labs  12/09/13 1218 12/10/13 0247  WBC 13.6* 12.7*  HGB 10.5* 9.4*  HCT 31.4* 28.3*  PLT 400 300   BMET  Recent Labs  12/09/13 1218 12/10/13 0247  NA 136* 136*  K 3.1* 3.7  CL 96 98  CO2 24 27  GLUCOSE 123* 114*  BUN 10 9  CREATININE 0.48* 0.57  CALCIUM 8.3* 8.1*    COAG Lab Results  Component Value Date   INR 1.14 12/09/2013   INR 1.04 01/30/2011   INR 1.0 08/15/2007   No results found for this basename: PTT    Feet warm, graft pulse, wounds on left leg drying out Home vs rehab soon Continue nutrition support Ambulate  Ruta Hinds, MD [facility] [place] Office: [phone] Pager: [phone]

## 2013-12-11 NOTE — Addendum Note (Signed)
Addendum created 12/11/13 6237 by Albertha Ghee, MD   Modules edited: Anesthesia Responsible Staff

## 2013-12-11 NOTE — Progress Notes (Signed)
Occupational Therapy Treatment Patient Details Name: Thomas Ferrell MRN: [id number] DOB: 1941/10/02 Today's Date: 12/11/2013 Time: 8546-2703 OT Time Calculation (min): 39 min  OT Assessment / Plan / Recommendation  History of present illness Pt is s/p BYPASS GRAFT FEMORAL-POPLITEAL ARTERY - LEFT  BYPASS GRAFT FEMORAL-POPLITEAL ARTERY - LEFT     OT comments  Pt. Was Mod I with supine to sit and sit to supine. Pt. Was ed. On use of tub bench but refused to practice. Pt. Was ed. On use of AE for LE bathing and dressing and returned demo. Pt. Issues sock donner.   Follow Up Recommendations  Home health OT    Barriers to Discharge       Equipment Recommendations       Recommendations for Other Services    Frequency Min 2X/week   Progress towards OT Goals Progress towards OT goals: Progressing toward goals  Plan      Precautions / Restrictions Precautions Precautions: Fall Restrictions Weight Bearing Restrictions: No   Pertinent Vitals/Pain 2-3/10    ADL  Lower Body Dressing: Performed;Minimal assistance Where Assessed - Lower Body Dressing: Supported sit to stand Tub/Shower Transfer:  (demo but pt. refused to return demo)    OT Diagnosis:    OT Problem List:   OT Treatment Interventions:     OT Goals(current goals can now be found in the care plan section) Acute Rehab OT Goals Patient Stated Goal: to go home to wife  Visit Information  Last OT Received On: 12/11/13 Assistance Needed: +1 History of Present Illness: Pt is s/p BYPASS GRAFT FEMORAL-POPLITEAL ARTERY - LEFT  BYPASS GRAFT FEMORAL-POPLITEAL ARTERY - LEFT      Subjective Data      Prior Functioning       Cognition  Cognition Arousal/Alertness: Awake/alert Behavior During Therapy: WFL for tasks assessed/performed Overall Cognitive Status: Within Functional Limits for tasks assessed    Mobility  Bed Mobility Overal bed mobility: Modified Independent Bed Mobility: Supine to Sit Supine to sit:  Supervision;HOB elevated Transfers Sit to Stand: Min guard General transfer comment: min guard to steady; cues for hand placement and sequencing with transfers and with RW    Exercises      Balance    End of Session OT - End of Session Activity Tolerance: Patient tolerated treatment well Patient left: in bed;with call bell/phone within reach;with bed alarm set  [place] 12/11/2013, 9:09 AM

## 2013-12-11 NOTE — Care Management Note (Unsigned)
    Page 1 of 1   12/11/2013     5:10:44 PM   CARE MANAGEMENT NOTE 12/11/2013  Patient:  TRI, CHITTICK   Account Number:  [id number]  Date Initiated:  12/10/2013  Documentation initiated by:  Marvetta Gibbons  Subjective/Objective Assessment:   Pt admitted s/p fem-fem/-fem-pop bypass graft     Action/Plan:   PTA pt lived at home- PT/OT evals ordered- rehab vs [facility]- NCM to follow for recommendations   Anticipated DC Date:  12/12/2013   Anticipated DC Plan:        [place]  CM consult      Choice offered to / List presented to:             Status of service:  In process, will continue to follow Medicare Important Message given?   (If response is "NO", the following Medicare IM given date fields will be blank) Date Medicare IM given:   Date Additional Medicare IM given:    Discharge Disposition:    Per UR Regulation:  Reviewed for med. necessity/level of care/duration of stay  If discussed at [place] of Stay Meetings, dates discussed:    Comments:  12/11/13 Quinzell Malcomb,RN,BSN [id number] REHAB HAS DECLINED ADMISSION, AS PT/OT RECOMMENDING HHPT/OT AT DC.  WILL NEED ORDERS FOR HH AND FACE TO FACE DOCUMENTATION FOR MEDICARE.  CASE MANAGER WILL ARRANGE. THANKS.

## 2013-12-11 NOTE — Progress Notes (Signed)
Physical Therapy Treatment Patient Details Name: Thomas Ferrell MRN: [id number] DOB: [birth year] Today's Date: 12/11/2013 Time: 0175-1025 PT Time Calculation (min): 22 min  PT Assessment / Plan / Recommendation  History of Present Illness Pt is s/p BYPASS GRAFT FEMORAL-POPLITEAL ARTERY - LEFT  BYPASS GRAFT FEMORAL-POPLITEAL ARTERY - LEFT     PT Comments   Pt received pain medicine prior to session and appeared confused. Pt having difficulty following simple commands for ROM exercises and difficulty understanding how to work his bed control to promote knee extension (despite multiple attempts to educate pt; at his request). Mobility deferred at this time (due to confusion and just back to bed recently). Will attempt later today as schedule permits.   Follow Up Recommendations  Home health PT;Supervision/Assistance - 24 hour     Does the patient have the potential to tolerate intense rehabilitation     Barriers to Discharge        Equipment Recommendations  None recommended by PT    Recommendations for Other Services OT consult  Frequency Min 3X/week   Progress towards PT Goals Progress towards PT goals: Not progressing toward goals - comment (AMS)  Plan Current plan remains appropriate    Precautions / Restrictions Precautions Precautions: Fall Restrictions Weight Bearing Restrictions: No   Pertinent Vitals/Pain At rest, denies pain. With exercises he reports "flashes" of pain running down anterior lower leg to dorsum of foot     Mobility  Bed Mobility Overal bed mobility: Modified Independent Bed Mobility: Supine to Sit Supine to sit: Supervision;HOB elevated Transfers Sit to Stand: Min guard General transfer comment: min guard to steady; cues for hand placement and sequencing with transfers and with RW    Exercises General Exercises - Lower Extremity Ankle Circles/Pumps: PROM;AROM;Left;Right;Other reps (comment);Supine (5 PROM on Lt; 10 AROM on Rt) Quad Sets:  AROM;Both;10 reps;Supine Short Arc Quad: AROM;Both;10 reps;Supine   PT Diagnosis:    PT Problem List:   PT Treatment Interventions:     PT Goals (current goals can now be found in the care plan section) Acute Rehab PT Goals Patient Stated Goal: to go home to wife  Visit Information  Last PT Received On: 12/11/13 Assistance Needed: +1 History of Present Illness: Pt is s/p BYPASS GRAFT FEMORAL-POPLITEAL ARTERY - LEFT  BYPASS GRAFT FEMORAL-POPLITEAL ARTERY - LEFT      Subjective Data  Patient Stated Goal: to go home to wife   Cognition  Cognition Arousal/Alertness: Awake/alert Behavior During Therapy: WFL for tasks assessed/performed Overall Cognitive Status: Impaired/Different from baseline Area of Impairment: Following commands;Problem solving Following Commands: Follows one step commands with increased time Problem Solving: Slow processing;Difficulty sequencing;Requires verbal cues General Comments: Pt had pain medicine recently; ?effects of medicine. Said I was the 3rd therapist in his room today (OT had seen him earlier); also difficulty figuring out how to raise and lower foot of bed (despite holding bed remote in his lap and detailed education on the two buttons he needed to use)    Balance  General Comments General comments (skin integrity, edema, etc.): On arrival, pt sitting up in bed with foot of bed fully flexed (knees ~80 degrees). After ROM exercises, placed pillow under distal LLE to promote knee extension. Pt up recently with OT and just back to bed. Asked to wait to walk later. Seemed somewhat confused as reviewing exercises, therefore further acitity deferred and will try to see in pm  End of Session PT - End of Session Activity Tolerance: Other (comment) (limited by  AMS and pt asking to defer walking) Patient left: in bed;with call bell/phone within reach;with bed alarm set;with nursing/sitter in room Nurse Communication: Other (comment) (AMS ? med related)   GP      Natash Berman 12/11/2013, 10:57 AM Pager [phone]

## 2013-12-12 NOTE — Progress Notes (Signed)
   VASCULAR PROGRESS NOTE  SUBJECTIVE: Says that he is able to ambulate. Pain well controlled.   PHYSICAL EXAM: Filed Vitals:   12/11/13 0605 12/11/13 1345 12/11/13 1957 12/12/13 0423  BP: 126/43 135/58 145/56 124/57  Pulse: 79 82 94 85  Temp: 98.3 F (36.8 C) 99.9 F (37.7 C) 100 F (37.8 C) 99.1 F (37.3 C)  TempSrc: Oral Oral Oral Oral  Resp: 18 18 18 17   Height:      Weight:      SpO2: 89% 92% 92% 90%   Palpable fem-fem graft pulse Left leg color looks much better except no change in full thickness wounds.   LABS: Lab Results  Component Value Date   WBC 12.7* 12/10/2013   HGB 9.4* 12/10/2013   HCT 28.3* 12/10/2013   MCV 86.8 12/10/2013   PLT 300 12/10/2013   Lab Results  Component Value Date   CREATININE 0.57 12/10/2013   Lab Results  Component Value Date   INR 1.14 12/09/2013   Active Problems:   PAD (peripheral artery disease)  ASSESSMENT AND PLAN:  * 3 Days Post-Op s/p: Right to left Fem-Fem bypass and Left Fempop bypass  *  Home today with HHPT  * Close follow-up with [name].   Gae Gallop Beeper: [phone] 12/12/2013

## 2013-12-12 NOTE — Progress Notes (Signed)
Physical Therapy Treatment Patient Details Name: Thomas Ferrell MRN: [id number] DOB: 1941/10/23 Today's Date: 12/12/2013 Time: 1110 ([phone] PT Time Calculation (min): 18 min  PT Assessment / Plan / Recommendation  History of Present Illness Pt is s/p BYPASS GRAFT FEMORAL-POPLITEAL ARTERY - LEFT  BYPASS GRAFT FEMORAL-POPLITEAL ARTERY - LEFT     PT Comments   Patient requiring Max encouragement and increased time to get EOB and stand x2. Attempted taking steps but patient adament he could not and needed to sit. Patient is planning to DC home with his wife and states he is fine to do at home. RN made aware of situation.   Follow Up Recommendations  Home health PT;Supervision/Assistance - 24 hour     Does the patient have the potential to tolerate intense rehabilitation     Barriers to Discharge        Equipment Recommendations  None recommended by PT    Recommendations for Other Services    Frequency Min 3X/week   Progress towards PT Goals Progress towards PT goals: Progressing toward goals  Plan Current plan remains appropriate    Precautions / Restrictions Precautions Precautions: Fall Restrictions Weight Bearing Restrictions: No   Pertinent Vitals/Pain Complaioned of leg pain. Patient premedicated    Mobility  Bed Mobility Supine to sit: Min assist General bed mobility comments: Min A as patient pulls up using PTAs hands to sit up to EOB.  Transfers Overall transfer level: Needs assistance Equipment used: Rolling walker (2 wheeled) Sit to Stand: Min guard General transfer comment: min guard to steady; cues for hand placement and sequencing with transfers and with RW. Patient stood x2 Ambulation/Gait General Gait Details: patient stated he was unable to walk today despite max encouragement as he is planning to DC home. RN aware. Patient still adament on going home and "being fine to do it there"    Exercises     PT Diagnosis:    PT Problem List:   PT  Treatment Interventions:     PT Goals (current goals can now be found in the care plan section)    Visit Information  Last PT Received On: 12/12/13 Assistance Needed: +1 History of Present Illness: Pt is s/p BYPASS GRAFT FEMORAL-POPLITEAL ARTERY - LEFT  BYPASS GRAFT FEMORAL-POPLITEAL ARTERY - LEFT      Subjective Data      Cognition  Cognition Arousal/Alertness: Awake/alert Behavior During Therapy: WFL for tasks assessed/performed Overall Cognitive Status: Impaired/Different from baseline Area of Impairment: Following commands;Problem solving Following Commands: Follows one step commands with increased time Problem Solving: Slow processing;Difficulty sequencing;Requires verbal cues General Comments: Patient very scattered with thoughts this morning. Decreased aware of deficits. Patient adament that he is going home and wife can assist with all needs    Balance     End of Session PT - End of Session Equipment Utilized During Treatment: Gait belt Patient left: in bed;with call bell/phone within reach;with nursing/sitter in room   GP     Aurelia Gras, Tonia Brooms 12/12/2013, 1:05 PM  12/12/2013 Jacqualyn Posey PTA [phone] pager [phone] office

## 2013-12-12 NOTE — Progress Notes (Signed)
   CARE MANAGEMENT NOTE 12/12/2013  Patient:  Thomas Ferrell, Thomas Ferrell   Account Number:  [id number]  Date Initiated:  12/10/2013  Documentation initiated by:  Marvetta Gibbons  Subjective/Objective Assessment:   Pt admitted s/p fem-fem/-fem-pop bypass graft     Action/Plan:   PTA pt lived at home- PT/OT evals ordered- rehab vs [facility]- NCM to follow for recommendations   Anticipated DC Date:  12/12/2013   Anticipated DC Plan:        [place]  CM consult      [facility] Choice  HOME HEALTH   Choice offered to / List presented to:  C-1 Patient        [facility] arranged  HH-2 PT      [place].   Status of service:  Completed, signed off Medicare Important Message given?   (If response is "NO", the following Medicare IM given date fields will be blank) Date Medicare IM given:   Date Additional Medicare IM given:    Discharge Disposition:  [place]  Per UR Regulation:  Reviewed for med. necessity/level of care/duration of stay  If discussed at [place] of Stay Meetings, dates discussed:    Comments:  12/12/13 11:30 CM spoke with pt in room to offer choice.  Pt chooses [facility] to render HHPT services.  No DME recc.  Address and contact number verifies. Referral faxed to [facility] for HHPT.   No other CM needs were communicated.  Mariane Masters, BSN, [place] [phone].  12/11/13 JULIE AMERSON,RN,BSN [phone] REHAB HAS DECLINED ADMISSION, AS PT/OT RECOMMENDING HHPT/OT AT DC.  WILL NEED ORDERS FOR HH AND FACE TO FACE DOCUMENTATION FOR MEDICARE.  CASE MANAGER WILL ARRANGE. THANKS.

## 2013-12-12 NOTE — Progress Notes (Signed)
Discharged to home with family office visits in place teaching done  

## 2013-12-14 ENCOUNTER — Telehealth: Payer: Self-pay

## 2013-12-14 NOTE — Telephone Encounter (Signed)
LVM for patient regarding follow up scheduled for 12/31/13 at 9:30am. dpm

## 2013-12-14 NOTE — Telephone Encounter (Signed)
Phone call from pt's wife.  Requesting several questions be answered.  1) asking about f/u appt.; advised the f/u appt. Is usually scheduled in approx. 2 week time frame of surgery; will notify the scheduler to set up f/u appt. 2) Asking about Physical Therapy referral; no discharge summary is available yet ; will need to check on discharge recommendations and let pt. know plan. 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 amt. of clear to bloody fluid from the old sores; encouraged to cleanse the sores of lower (L) leg, daily, with an antibacterial soap, rinse well, and cover with a light gauze bandage; change prn.  Monitor for signs of increased redness/ warmth/ worsening drainage.  4) Reports noting his penis is swollen on one side since Saturday; denies redness.  States urinating okay, but thinks he isn't urinating as much as usual.  States he isn't drinking much fluids except at meals.  Discussed w/ Dr. Trula Slade.  States the swelling of penis does happen post-operatively, usually due to fluid collected in that area; continue to monitor, and give more time to resolve.  Discussed the above with pt's wife.  Verb. Understanding.  Will forward to appt. desk for f/u post-op appt. to be scheduled.

## 2013-12-16 ENCOUNTER — Other Ambulatory Visit: Payer: Self-pay | Admitting: *Deleted

## 2013-12-16 DIAGNOSIS — I739 Peripheral vascular disease, unspecified: Secondary | ICD-10-CM

## 2013-12-17 ENCOUNTER — Telehealth: Payer: Self-pay

## 2013-12-17 NOTE — Telephone Encounter (Signed)
Phone call from pt's wife.  Reports pt. has had chills x 2 days.  Hasn't checked temperature.  C/o constipation; reports he had a small BM on 2/3 AM.  Is passing gas, denies any nausea, abdominal pain, or bloating.  Has taken Miralax and also Senokot for the constipation.  Encouraged to increase fluid intake and increase mobility for the constipation.  States pt. has intermittent cough; nonproductive.  Denies any noted shortness of breath.  Encouraged to have pt. Deliberately take deep breaths and cough intermittently to exercise his lungs.  Wife states pt. reports he is urinating in smaller amts.; unsure about any symptoms of frequency or urgency.  Wife at work at time of phone call, and states she will check pt's temperature after she gets home @ 1:00 PM, and call back to the office.

## 2013-12-17 NOTE — Telephone Encounter (Signed)
Rec'd phone call from pt's wife at approx. 1:15 PM.  Reported pt. had a temp. of 97.5.  Reported that pt. had been nauseated earlier, around 11:45 AM, but stated he is feeling better at time of phone call.  Reported pt. has trouble sleeping at night;  asking about over-the-counter sleeping medications.  Advised that he will need to check with his PCP re: any sleeping medications.  Wife states pt. denies any further chilling.  Questioned about urinary symptoms this afternoon.  Stated her husband doesn't remember if he urinated in adequate amts.  Advised to continue to encourage fluid intake, and to monitor bowel movements, since pt. doesn't remember.  Wife instructed to call PCP if symptoms of dysuria.  Verb. understanding.

## 2013-12-18 ENCOUNTER — Telehealth: Payer: Self-pay | Admitting: *Deleted

## 2013-12-18 ENCOUNTER — Other Ambulatory Visit: Payer: Self-pay | Admitting: *Deleted

## 2013-12-18 DIAGNOSIS — I739 Peripheral vascular disease, unspecified: Secondary | ICD-10-CM

## 2013-12-18 DIAGNOSIS — I10 Essential (primary) hypertension: Secondary | ICD-10-CM | POA: Diagnosis not present

## 2013-12-18 DIAGNOSIS — M25569 Pain in unspecified knee: Secondary | ICD-10-CM | POA: Diagnosis not present

## 2013-12-18 DIAGNOSIS — J449 Chronic obstructive pulmonary disease, unspecified: Secondary | ICD-10-CM | POA: Diagnosis not present

## 2013-12-18 DIAGNOSIS — IMO0001 Reserved for inherently not codable concepts without codable children: Secondary | ICD-10-CM | POA: Diagnosis not present

## 2013-12-18 DIAGNOSIS — Z48812 Encounter for surgical aftercare following surgery on the circulatory system: Secondary | ICD-10-CM | POA: Diagnosis not present

## 2013-12-18 DIAGNOSIS — Z8673 Personal history of transient ischemic attack (TIA), and cerebral infarction without residual deficits: Secondary | ICD-10-CM | POA: Diagnosis not present

## 2013-12-18 NOTE — Telephone Encounter (Signed)
[name], Physical Therapist from [place] called requesting a verbal order for a skilled nursing assessment for wound care to patients left leg and instruction on nutritional needs.  She states that the wound is not draining or worsening and patient is afebrile but feels the wound needs evaluated by a nurse.

## 2013-12-21 DIAGNOSIS — Z48812 Encounter for surgical aftercare following surgery on the circulatory system: Secondary | ICD-10-CM | POA: Diagnosis not present

## 2013-12-21 DIAGNOSIS — M25569 Pain in unspecified knee: Secondary | ICD-10-CM | POA: Diagnosis not present

## 2013-12-21 DIAGNOSIS — I10 Essential (primary) hypertension: Secondary | ICD-10-CM | POA: Diagnosis not present

## 2013-12-21 DIAGNOSIS — IMO0001 Reserved for inherently not codable concepts without codable children: Secondary | ICD-10-CM | POA: Diagnosis not present

## 2013-12-21 DIAGNOSIS — Z8673 Personal history of transient ischemic attack (TIA), and cerebral infarction without residual deficits: Secondary | ICD-10-CM | POA: Diagnosis not present

## 2013-12-21 DIAGNOSIS — J449 Chronic obstructive pulmonary disease, unspecified: Secondary | ICD-10-CM | POA: Diagnosis not present

## 2013-12-22 ENCOUNTER — Emergency Department (HOSPITAL_COMMUNITY)
Admission: EM | Admit: 2013-12-22 | Discharge: 2013-12-22 | Disposition: A | Discharge status: Home / Self Care | Payer: Medicare Other | Attending: Emergency Medicine | Admitting: Emergency Medicine

## 2013-12-22 ENCOUNTER — Encounter (HOSPITAL_COMMUNITY): Payer: Self-pay | Admitting: Emergency Medicine

## 2013-12-22 ENCOUNTER — Emergency Department (HOSPITAL_COMMUNITY): Payer: Medicare Other

## 2013-12-22 ENCOUNTER — Telehealth: Payer: Self-pay

## 2013-12-22 DIAGNOSIS — E785 Hyperlipidemia, unspecified: Secondary | ICD-10-CM | POA: Insufficient documentation

## 2013-12-22 DIAGNOSIS — J441 Chronic obstructive pulmonary disease with (acute) exacerbation: Secondary | ICD-10-CM | POA: Insufficient documentation

## 2013-12-22 DIAGNOSIS — K5900 Constipation, unspecified: Secondary | ICD-10-CM | POA: Diagnosis not present

## 2013-12-22 DIAGNOSIS — Z8551 Personal history of malignant neoplasm of bladder: Secondary | ICD-10-CM | POA: Insufficient documentation

## 2013-12-22 DIAGNOSIS — K2970 Gastritis, unspecified, without bleeding: Secondary | ICD-10-CM | POA: Diagnosis not present

## 2013-12-22 DIAGNOSIS — Z8673 Personal history of transient ischemic attack (TIA), and cerebral infarction without residual deficits: Secondary | ICD-10-CM | POA: Insufficient documentation

## 2013-12-22 DIAGNOSIS — K5641 Fecal impaction: Secondary | ICD-10-CM | POA: Diagnosis not present

## 2013-12-22 DIAGNOSIS — Z79899 Other long term (current) drug therapy: Secondary | ICD-10-CM | POA: Insufficient documentation

## 2013-12-22 DIAGNOSIS — I10 Essential (primary) hypertension: Secondary | ICD-10-CM | POA: Insufficient documentation

## 2013-12-22 DIAGNOSIS — Z87891 Personal history of nicotine dependence: Secondary | ICD-10-CM | POA: Diagnosis not present

## 2013-12-22 DIAGNOSIS — R10819 Abdominal tenderness, unspecified site: Secondary | ICD-10-CM | POA: Diagnosis not present

## 2013-12-22 DIAGNOSIS — K2990 Gastroduodenitis, unspecified, without bleeding: Secondary | ICD-10-CM | POA: Diagnosis not present

## 2013-12-22 DIAGNOSIS — R109 Unspecified abdominal pain: Secondary | ICD-10-CM | POA: Diagnosis not present

## 2013-12-22 LAB — PRO B NATRIURETIC PEPTIDE: Pro B Natriuretic peptide (BNP): 713.5 pg/mL — ABNORMAL HIGH (ref 0–125)

## 2013-12-22 LAB — URINALYSIS, ROUTINE W REFLEX MICROSCOPIC
Bilirubin Urine: NEGATIVE
Glucose, UA: NEGATIVE mg/dL
Hgb urine dipstick: NEGATIVE
KETONES UR: NEGATIVE mg/dL
LEUKOCYTES UA: NEGATIVE
Nitrite: NEGATIVE
PH: 6.5 (ref 5.0–8.0)
Protein, ur: NEGATIVE mg/dL
Specific Gravity, Urine: 1.022 (ref 1.005–1.030)
UROBILINOGEN UA: 0.2 mg/dL (ref 0.0–1.0)

## 2013-12-22 LAB — POCT I-STAT TROPONIN I: Troponin i, poc: 0.01 ng/mL (ref 0.00–0.08)

## 2013-12-22 LAB — BASIC METABOLIC PANEL
BUN: 20 mg/dL (ref 6–23)
CALCIUM: 8.8 mg/dL (ref 8.4–10.5)
CO2: 27 mEq/L (ref 19–32)
Chloride: 98 mEq/L (ref 96–112)
Creatinine, Ser: 0.47 mg/dL — ABNORMAL LOW (ref 0.50–1.35)
Glucose, Bld: 156 mg/dL — ABNORMAL HIGH (ref 70–99)
Potassium: 3.3 mEq/L — ABNORMAL LOW (ref 3.7–5.3)
Sodium: 140 mEq/L (ref 137–147)

## 2013-12-22 LAB — CBC WITH DIFFERENTIAL/PLATELET
BASOS ABS: 0 10*3/uL (ref 0.0–0.1)
BASOS PCT: 0 % (ref 0–1)
EOS ABS: 0 10*3/uL (ref 0.0–0.7)
Eosinophils Relative: 0 % (ref 0–5)
HEMATOCRIT: 29.7 % — AB (ref 39.0–52.0)
HEMOGLOBIN: 9.9 g/dL — AB (ref 13.0–17.0)
Lymphocytes Relative: 3 % — ABNORMAL LOW (ref 12–46)
Lymphs Abs: 0.6 10*3/uL — ABNORMAL LOW (ref 0.7–4.0)
MCH: 28.3 pg (ref 26.0–34.0)
MCHC: 33.3 g/dL (ref 30.0–36.0)
MCV: 84.9 fL (ref 78.0–100.0)
MONO ABS: 1.3 10*3/uL — AB (ref 0.1–1.0)
MONOS PCT: 7 % (ref 3–12)
Neutro Abs: 17.4 10*3/uL — ABNORMAL HIGH (ref 1.7–7.7)
Neutrophils Relative %: 90 % — ABNORMAL HIGH (ref 43–77)
Platelets: 363 10*3/uL (ref 150–400)
RBC: 3.5 MIL/uL — ABNORMAL LOW (ref 4.22–5.81)
RDW: 15.3 % (ref 11.5–15.5)
WBC: 19.3 10*3/uL — ABNORMAL HIGH (ref 4.0–10.5)

## 2013-12-22 MED ORDER — DOCUSATE SODIUM 100 MG PO CAPS: 100.0000 mg | ORAL_CAPSULE | Freq: Two times a day (BID) | ORAL | Status: DC

## 2013-12-22 MED ORDER — FENTANYL CITRATE 0.05 MG/ML IJ SOLN
50.0000 ug | Freq: Once | INTRAMUSCULAR | Status: AC
  Administered 2013-12-22: 50 ug via INTRAVENOUS
  Filled 2013-12-22: qty 2

## 2013-12-22 MED ORDER — FLEET ENEMA 7-19 GM/118ML RE ENEM
1.0000 | ENEMA | Freq: Once | RECTAL | Status: AC
  Administered 2013-12-22: 1 via RECTAL
  Filled 2013-12-22: qty 1

## 2013-12-22 NOTE — Discharge Instructions (Signed)
Constipation, Adult  Constipation is when a person:  · Poops (bowel movement) less than 3 times a week.  · Has a hard time pooping.  · Has poop that is dry, hard, or bigger than normal.  HOME CARE   · Eat more fiber, such as fruits, vegetables, whole grains like brown rice, and beans.  · Eat less fatty foods and sugar. This includes French fries, hamburgers, cookies, candy, and soda.  · If you are not getting enough fiber from food, take products with added fiber in them (supplements).  · Drink enough fluid to keep your pee (urine) clear or pale yellow.  · Go to the restroom when you feel like you need to poop. Do not hold it.  · Only take medicine as told by your doctor. Do not take medicines that help you poop (laxatives) without talking to your doctor first.  · Exercise on a regular basis, or as told by your doctor.  GET HELP RIGHT AWAY IF:   · You have bright red blood in your poop (stool).  · Your constipation lasts more than 4 days or gets worse.  · You have belly (abdomen) or butt (rectal) pain.  · You have thin poop (as thin as a pencil).  · You lose weight, and it cannot be explained.  MAKE SURE YOU:   · Understand these instructions.  · Will watch your condition.  · Will get help right away if you are not doing well or get worse.  Document Released: 04/16/2008 Document Revised: 01/21/2012 Document Reviewed: 08/10/2013  [employer]® Patient Information ©2014 [employer].

## 2013-12-22 NOTE — Telephone Encounter (Signed)
Wife called on pt's behalf.  Reports pt. has increased swelling in the bilateral groin areas with increased warmth / redness at site.  States he cries out in pain.  Reports bowels are not moving regularly, despite measures to increase fluids and to increase mobility.  States no fever.  Reports concern that pt. has no control over his bladder; states he is continually leaking urine.  Requests to have him evaluated today.  Will discuss w/ Dr. Kellie Simmering.  Per Dr. Kellie Simmering, advised to contact [name].  Per [name], recommend pt. be brought to the ER now for evaluation.  Wife notified of need to take pt. to the ER immediately for evaluation.  Verb. Understanding.

## 2013-12-22 NOTE — ED Provider Notes (Signed)
CSN: [id number]     Arrival date & time 12/22/13  1125 History   First MD Initiated Contact with Patient 12/22/13 1134     Chief Complaint  Patient presents with  . Abdominal Pain      HPI The patient had surgery on January 28 that included a femoral to femoral bypass graft and a femoral popliteal bypass graft procedures.  The patient presents to the emergency department today because of complaints of lower abdominal pain as well as pain in the rectal area. She admits to not having a bowel movement for several days. He is having colicky, cramping type pain especially in the anal area. Patient denies any nausea vomiting. He denies any fevers. He was taking hydrocodone for pain but stopped that because he thought it could be contributing to his constipation.  Patient called his surgeon's office today. He was told to come to the emergency department. [name] was can see him here down in the emergency department. Past Medical History  Diagnosis Date  . Hypertension   . Dyslipidemia   . Cerebrovascular disease     right brain CVA 2007  . Lumbago   . Abnormality of gait 03/03/2013  . CVA (cerebral vascular accident) 2007    r-cva  . COPD (chronic obstructive pulmonary disease)   . Cough productive of clear sputum     TX RECENT SINUS INFECTION   . bladder ca dx'd 11/2009    chemo/xrt comp 02/2010   Past Surgical History  Procedure Laterality Date  . Carotid artery angioplasty  2009  . Back surgery  2010  . Bladder cancer    . Laminectomy  09/08/2013    L 2 L3 L4 L5       Dr Luiz Ochoa  . Femoral-femoral bypass graft N/A 12/09/2013    Procedure: BYPASS GRAFT FEMORAL-FEMORAL ARTERY WITH BILATERAL ENDARTERECTOMY ;  Surgeon: Elam Dutch, MD;  Location: [facility] OR;  Service: Vascular;  Laterality: N/A;  . Femoral-popliteal bypass graft Left 12/09/2013    Procedure: BYPASS GRAFT FEMORAL-POPLITEAL ARTERY - LEFT ;  Surgeon: Elam Dutch, MD;  Location: [facility] OR;  Service: Vascular;  Laterality:  Left;   Family History  Problem Relation Age of Onset  . Stroke Mother   . Cancer Father   . Dementia Father   . Cancer Sister    History  Substance Use Topics  . Smoking status: Former Smoker    Types: Cigarettes    Quit date: 11/12/2005  . Smokeless tobacco: Never Used  . Alcohol Use: 12.6 oz/week    21 Cans of beer per week    Review of Systems  Respiratory: Positive for shortness of breath. Negative for chest tightness.   Cardiovascular: Negative for chest pain.  All other systems reviewed and are negative.      Allergies  Morphine and related  Home Medications   Current Outpatient Rx  Name  Route  Sig  Dispense  Refill  . acetaminophen (TYLENOL) 500 MG tablet   Oral   Take 1,000 mg by mouth every 6 (six) hours as needed for pain.         Marland Kitchen amLODipine (NORVASC) 5 MG tablet   Oral   Take 5 mg by mouth daily.         . feeding supplement, ENSURE COMPLETE, (ENSURE COMPLETE) LIQD   Oral   Take 237 mLs by mouth 3 (three) times daily with meals.         . levETIRAcetam (KEPPRA) 250  MG tablet   Oral   Take 250 mg by mouth 2 (two) times daily.         Marland Kitchen lisinopril (PRINIVIL,ZESTRIL) 20 MG tablet   Oral   Take 20 mg by mouth daily.         Marland Kitchen oxyCODONE-acetaminophen (PERCOCET/ROXICET) 5-325 MG per tablet   Oral   Take 2 tablets by mouth every 4 (four) hours as needed for severe pain.   30 tablet   0   . simvastatin (ZOCOR) 20 MG tablet   Oral   Take 20 mg by mouth daily.         Marland Kitchen docusate sodium (COLACE) 100 MG capsule   Oral   Take 1 capsule (100 mg total) by mouth every 12 (twelve) hours.   30 capsule   0    BP 157/100  Pulse 89  Temp(Src) 98.4 F (36.9 C) (Oral)  Resp 18  Ht 5\' 4"  (1.626 m)  Wt 95 lb (43.092 kg)  BMI 16.30 kg/m2  SpO2 95% Physical Exam  Nursing note and vitals reviewed. Constitutional: He appears well-developed and well-nourished. No distress.  HENT:  Head: Normocephalic and atraumatic.  Right Ear:  External ear normal.  Left Ear: External ear normal.  Eyes: Conjunctivae are normal. Right eye exhibits no discharge. Left eye exhibits no discharge. No scleral icterus.  Neck: Neck supple. No tracheal deviation present.  Cardiovascular: Normal rate, regular rhythm and intact distal pulses.   Pulmonary/Chest: Effort normal and breath sounds normal. No stridor. No respiratory distress. He has no wheezes. He has no rales.  Abdominal: Soft. Bowel sounds are normal. He exhibits no distension. There is tenderness. There is no rebound and no guarding.  Mild tenderness in the lower abdomen, no guarding or rebound; palpable arterial graft in the suprapubic region with a strong pulse, no erythema,  bilateral wounds in the inguinal area that are well approximated without erythema or drainage or induration  Genitourinary: Rectal exam shows tenderness.  Brown stool with fecal impaction in the rectal vault  Musculoskeletal: He exhibits no edema and no tenderness.  Healing chronic ulcerations/scabs  in left lower extremity, no erythema or drainage, warm distal extremities, no cyanosis  Neurological: He is alert. He has normal strength. No cranial nerve deficit (no facial droop, extraocular movements intact, no slurred speech) or sensory deficit. He exhibits normal muscle tone. He displays no seizure activity. Coordination normal.  Skin: Skin is warm and dry. No rash noted.  Psychiatric: He has a normal mood and affect.    ED Course  Fecal disimpaction Date/Time: 12/22/2013 2:48 PM Performed by: Dorie Rank R Authorized by: Dorie Rank R Patient tolerance: Patient tolerated the procedure well with no immediate complications. Comments: Manual disimpaction during Digital rectal exam after pt had been given an enema earlier.  Some soft stool with firmer stool proximally.  Mild results.   (including critical care time)   Labs Review Labs Reviewed  CBC WITH DIFFERENTIAL - Abnormal; Notable for the following:     WBC 19.3 (*)    RBC 3.50 (*)    Hemoglobin 9.9 (*)    HCT 29.7 (*)    Neutrophils Relative % 90 (*)    Neutro Abs 17.4 (*)    Lymphocytes Relative 3 (*)    Lymphs Abs 0.6 (*)    Monocytes Absolute 1.3 (*)    All other components within normal limits  BASIC METABOLIC PANEL - Abnormal; Notable for the following:    Potassium 3.3 (*)  Glucose, Bld 156 (*)    Creatinine, Ser 0.47 (*)    All other components within normal limits  PRO B NATRIURETIC PEPTIDE - Abnormal; Notable for the following:    Pro B Natriuretic peptide (BNP) 713.5 (*)    All other components within normal limits  URINALYSIS, ROUTINE W REFLEX MICROSCOPIC  POCT I-STAT TROPONIN I   Imaging Review Dg Abd Acute W/chest  12/22/2013   CLINICAL DATA:  Abdominal pain.  EXAM: ACUTE ABDOMEN SERIES (ABDOMEN 2 VIEW & CHEST 1 VIEW)  COMPARISON:  Chest x-ray dated 09/01/2013 and CT scan abdomen and pelvis dated 03/21/2010  FINDINGS: The patient has chronic interstitial and obstructive lung disease. No acute abnormalities in the chest.  No free air or free fluid in the abdomen. No dilated loops of large or small bowel. Prior lumbar fusion. Previous vascular stent in the right common iliac artery.  IMPRESSION: No acute abnormalities of the abdomen or chest.   Electronically Signed   By: Rozetta Nunnery M.D.   On: 12/22/2013 12:49    EKG Interpretation    Date/Time:  Tuesday December 22 2013 12:52:50 EST Ventricular Rate:  79 PR Interval:  141 QRS Duration: 83 QT Interval:  423 QTC Calculation: 485 R Axis:   80 Text Interpretation:  Sinus rhythm Borderline prolonged QT interval No significant change since last tracing Confirmed by Venda Dice  MD-J, Torianne Laflam ([id number]) on 12/22/2013 1:02:12 PM           Medications  fentaNYL (SUBLIMAZE) injection 50 mcg (50 mcg Intravenous Given 12/22/13 1222)  sodium phosphate (FLEET) 7-19 GM/118ML enema 1 enema (1 enema Rectal Given 12/22/13 1333)  sodium phosphate (FLEET) 7-19 GM/118ML enema 1 enema (1  enema Rectal Given 12/22/13 1351)    MDM   Final diagnoses:  Constipation  Fecal impaction    [name] called in orders. He is in the OR and plans on seeing the patient.  The patient appears stable at this time for his evaluation.  Patient may benefit from an enema in the emergency department but I think we can wait until [name] is able to evaluate the patient  Dr Oneida Alar saw the patient earlier.  Pt's symptoms may be primarily related to his constipation.  Enemas have been ordered.  I performed fecal disimpaction.  Dr Oneida Alar will check on the patient after his scheduled surgery.  Leukocytosis noted.  Will continue to monitor.  Regarding elevated bnp, no chf noted on xray.  Doubt CHF exacerbation.  Pt noticed the dyspnea while he was having his abdominal cramping  Pt is feeling better.  Dr Oneida Alar states pt is ok to discharge from his standpoint.   Kathalene Frames, MD 12/22/13 [phone]

## 2013-12-22 NOTE — H&P (Signed)
Pt s/p fem fem left fem pop 1/28.  Pt has had worsening constipation no BM x 2 days.  Has also had increasing urinary frequency and some shortness of breath.  Denies chest pain fever nausea or vomiting.  No pain in left foot  PE:  Filed Vitals:   12/22/13 1125 12/22/13 1149  BP:  134/59  Pulse:  74  Temp:  98.4 F (36.9 C)  TempSrc:  Oral  Height:  5\' 4"  (1.626 m)  Weight:  95 lb (43.092 kg)  SpO2: 98% 97%    Abdomen: soft mildly tender, palpable graft pulse no erythema, groins without drainage Extremities: dry eschar lower leg tibia improved, feet pink, warm Chest: clear to auscultation Cardiac: RRR  A: constipation may be cause of all symptoms but will rule out, cardiopulmonary and UTI source as well  P: Chest xray EKG, Fleets enema x 2, urinalysis, CBC BMET  Ruta Hinds, MD [facility] [place] Office: [phone] Pager: [phone]

## 2013-12-22 NOTE — ED Notes (Signed)
Pt post surgical for femoral popliteal bypass on 1/28. Yesterday pt began having abdominal cramping, and swelling to incision. Pt states last bm 2 days ago. Pt stopped oxycodone yesterday. Pt is able to pass flatus. Denies vomiting and fever. C/o nausea that has resolved at this time.

## 2013-12-22 NOTE — ED Notes (Signed)
Pt wc to car. Pt states feeling better. Respirations easy non labored.

## 2013-12-24 NOTE — Discharge Summary (Signed)
[facility] Discharge Summary  Thomas Ferrell 1940-12-03 73 y.o. male  [id number]  Admission Date: 12/09/2013  Discharge Date: 12/12/13  Physician: No att. providers found  Admission Diagnosis: Peripheral Vascular Disease with ischemic rest pain, and nonhealing ulcers   HPI:   This is a 73 y.o. male who presents for evaluation of left leg ischemia. The patient is referred by Dr. March Rummage from the from the [facility]. The patient has a chronic history of numbness and tingling from the knee down on the left leg since spine surgery last fall. Over the course of several weeks he has developed multiple ulcerations on his left lower extremity. He denies rest pain in the left foot but does have chronic numbness. He was seen by Dr. March Rummage on January 9 and referred here for further evaluation. He is currently apply Neosporin to the wounds. He denies any prior similar episodes. He currently smokes 5 cigars per day. He had a right carotid endarterectomy for symptomatic carotid stenosis by Dr. Amedeo Plenty in 2007. Other medical problems include hypertension, hyperlipidemia, COPD, prior history of bladder cancer of which are currently stable.  Hospital Course:  The patient was admitted to the hospital and taken to the operating room on 12/09/2013 and underwent: Bilateral common femoral endarterectomy, Right to left femoral-femoral bypass 8 mm Dacron, left femoral to above knee popliteal bypass with 6 mm Propaten.  By POD 1, he was doing well.  He does have severe protein calorie malnutrition and was supplemented.  He had much improved perfusion to his left foot.    The pt tolerated the procedure well and was transported to the PACU in good condition. In the PACU, he had DP/PT doppler signal in both feet.  Pt was discharged home on POD 3 with HHPT.  The remainder of the hospital course consisted of increasing mobilization and increasing intake of solids without difficulty.  CBC   Component Value Date/Time   WBC 19.3* 12/22/2013 1225   WBC 5.5 07/18/2010 1449   RBC 3.50* 12/22/2013 1225   RBC 3.78* 07/18/2010 1449   RBC 3.16* 10/15/2009 1210   HGB 9.9* 12/22/2013 1225   HGB 13.3 07/18/2010 1449   HCT 29.7* 12/22/2013 1225   HCT 37.8* 07/18/2010 1449   PLT 363 12/22/2013 1225   PLT 190 07/18/2010 1449   MCV 84.9 12/22/2013 1225   MCV 100.0* 07/18/2010 1449   MCH 28.3 12/22/2013 1225   MCH 35.3* 07/18/2010 1449   MCHC 33.3 12/22/2013 1225   MCHC 35.3 07/18/2010 1449   RDW 15.3 12/22/2013 1225   RDW 12.9 07/18/2010 1449   LYMPHSABS 0.6* 12/22/2013 1225   LYMPHSABS 0.8* 07/18/2010 1449   MONOABS 1.3* 12/22/2013 1225   MONOABS 0.6 07/18/2010 1449   EOSABS 0.0 12/22/2013 1225   EOSABS 0.0 07/18/2010 1449   BASOSABS 0.0 12/22/2013 1225   BASOSABS 0.0 07/18/2010 1449    BMET    Component Value Date/Time   NA 140 12/22/2013 1225   K 3.3* 12/22/2013 1225   CL 98 12/22/2013 1225   CO2 27 12/22/2013 1225   GLUCOSE 156* 12/22/2013 1225   BUN 20 12/22/2013 1225   CREATININE 0.47* 12/22/2013 1225   CALCIUM 8.8 12/22/2013 1225   GFRNONAA >90 12/22/2013 1225   GFRAA >90 12/22/2013 1225     Discharge Instructions:   The patient is discharged to home with extensive instructions on wound care and progressive ambulation.  They are instructed not to drive or perform any heavy lifting  until returning to see the physician in his office.  Discharge Orders   Future Appointments Provider Department Dept Phone   12/31/2013 3:15 PM Elam Dutch, MD [facility] [phone]   Future Orders Complete By Expires   Call MD for:  redness, tenderness, or signs of infection (pain, swelling, bleeding, redness, odor or green/yellow discharge around incision site)  As directed    Call MD for:  severe or increased pain, loss or decreased feeling  in affected limb(s)  As directed    Call MD for:  temperature >100.5  As directed    Driving Restrictions  As directed    Comments:     No driving  for 2 weeks   Lifting restrictions  As directed    Comments:     No lifting for 4 weeks   may wash over wound with mild soap and water  As directed    Scheduling Instructions:     Shower daily with soap and water starting 12/13/13   Resume previous diet  As directed       Discharge Diagnosis:  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 PAD (peripheral artery disease) 12/09/2013  . Atherosclerosis of native arteries of the extremities with ulceration(440.23) 12/03/2013  . Abnormality of gait 03/03/2013  . Lumbago 03/03/2013   Past Medical History  Diagnosis Date  . Hypertension   . Dyslipidemia   . Cerebrovascular disease     right brain CVA 2007  . Lumbago   . Abnormality of gait 03/03/2013  . CVA (cerebral vascular accident) 2007    r-cva  . COPD (chronic obstructive pulmonary disease)   . Cough productive of clear sputum     TX RECENT SINUS INFECTION   . bladder ca dx'd 11/2009    chemo/xrt comp 02/2010       Medication List         acetaminophen 500 MG tablet  Commonly known as:  TYLENOL  Take 1,000 mg by mouth every 6 (six) hours as needed for pain.     amLODipine 5 MG tablet  Commonly known as:  NORVASC  Take 5 mg by mouth daily.     feeding supplement (ENSURE COMPLETE) Liqd  Take 237 mLs by mouth 3 (three) times daily with meals.     levETIRAcetam 250 MG tablet  Commonly known as:  KEPPRA  Take 250 mg by mouth 2 (two) times daily.     lisinopril 20 MG tablet  Commonly known as:  PRINIVIL,ZESTRIL  Take 20 mg by mouth daily.     oxyCODONE-acetaminophen 5-325 MG per tablet  Commonly known as:  PERCOCET/ROXICET  Take 2 tablets by mouth every 4 (four) hours as needed for severe pain.     simvastatin 20 MG tablet  Commonly known as:  ZOCOR  Take 20 mg by mouth daily.        Roxicet #30 No Refill  Disposition: home with HHPT  Patient's  condition: is Good  Follow up: 1. [name] in 2 weeks   Leontine Locket, PA-C [facility] [phone] 12/24/2013  12:43 PM  - For VQI Registry use --- Instructions: Press F2 to tab through selections.  Delete question if not applicable.   Post-op:  Wound infection: No  Graft infection: No  Transfusion: No  If yes, n/a units given New Arrhythmia: No Ipsilateral amputation: No, [ ]  Minor, [ ]   BKA, [ ]  AKA Discharge patency: [ x] Primary, [ ]  Primary assisted, [ ]  Secondary, [ ]  Occluded Patency judged by: [ ]  Dopper only, [ ]  Palpable graft pulse, [ ]  Palpable distal pulse, [ ]  ABI inc. > 0.15, [ ]  Duplex Discharge ABI: R not done before discharge, L not done before discharge Discharge TBI: R , L  D/C Ambulatory Status: Ambulatory  Complications: MI: No, [ ]  Troponin only, [ ]  EKG or Clinical CHF: No Resp failure:No, [ ]  Pneumonia, [ ]  Ventilator Chg in renal function: No, [ ]  Inc. Cr > 0.5, [ ]  Temp. Dialysis, [ ]  Permanent dialysis Stroke: No, [ ]  Minor, [ ]  Major Return to OR: No  Reason for return to OR: [ ]  Bleeding, [ ]  Infection, [ ]  Thrombosis, [ ]  Revision  Discharge medications: Statin use:  yes ASA use:  no Plavix use:  no Beta blocker use: no Coumadin use: no

## 2013-12-30 ENCOUNTER — Encounter: Payer: Self-pay | Admitting: Vascular Surgery

## 2013-12-30 DIAGNOSIS — IMO0001 Reserved for inherently not codable concepts without codable children: Secondary | ICD-10-CM | POA: Diagnosis not present

## 2013-12-30 DIAGNOSIS — I10 Essential (primary) hypertension: Secondary | ICD-10-CM | POA: Diagnosis not present

## 2013-12-30 DIAGNOSIS — Z8673 Personal history of transient ischemic attack (TIA), and cerebral infarction without residual deficits: Secondary | ICD-10-CM | POA: Diagnosis not present

## 2013-12-30 DIAGNOSIS — J449 Chronic obstructive pulmonary disease, unspecified: Secondary | ICD-10-CM | POA: Diagnosis not present

## 2013-12-30 DIAGNOSIS — M25569 Pain in unspecified knee: Secondary | ICD-10-CM | POA: Diagnosis not present

## 2013-12-30 DIAGNOSIS — Z48812 Encounter for surgical aftercare following surgery on the circulatory system: Secondary | ICD-10-CM | POA: Diagnosis not present

## 2013-12-31 ENCOUNTER — Encounter: Payer: Medicare Other | Admitting: Vascular Surgery

## 2013-12-31 ENCOUNTER — Encounter: Payer: Self-pay | Admitting: Vascular Surgery

## 2013-12-31 ENCOUNTER — Ambulatory Visit (INDEPENDENT_AMBULATORY_CARE_PROVIDER_SITE_OTHER): Payer: Self-pay | Admitting: Vascular Surgery

## 2013-12-31 VITALS — BP 115/59 | HR 124 | Ht 64.0 in | Wt 98.4 lb

## 2013-12-31 DIAGNOSIS — L98499 Non-pressure chronic ulcer of skin of other sites with unspecified severity: Principal | ICD-10-CM

## 2013-12-31 DIAGNOSIS — L97909 Non-pressure chronic ulcer of unspecified part of unspecified lower leg with unspecified severity: Secondary | ICD-10-CM | POA: Insufficient documentation

## 2013-12-31 DIAGNOSIS — I739 Peripheral vascular disease, unspecified: Secondary | ICD-10-CM

## 2013-12-31 NOTE — Progress Notes (Signed)
Patient is a 73 year old male who underwent right left femoral femoral bypass with bilateral common femoral endarterectomies and a left femoral to above-knee popliteal bypass all with prosthetic on January 28. He returns today for further followup. He still has intermittent episodes of pain in the ulcerations in his left leg. He has not had pain in the left foot. He has had no drainage from his incisions. He is working hard and physical therapy to improve his strength. He is not smoking. He was seen in the ER last week with fecal impaction most likely secondary to narcotics. He currently is taking Tylenol for pain.  Physical exam:  Filed Vitals:   12/31/13 1456  BP: 115/59  Pulse: 124  Height: 5\' 4"  (1.626 m)  Weight: 98 lb 6.4 oz (44.634 kg)  SpO2: 100%    Extremities: Palpable femoral femoral graft pulse, healing groin incisions Lower extremity: Feet pink warm bilaterally, healing above-knee popliteal incision, large dry eschar sections left pretibial left medial foot left medial malleolus  Assessment: Patent bypass grafts still with extensive wounds left lower extremity which hopefully will heal with time. The patient was a left lower extremity once daily with soap and water. Otherwise the wounds will be kept dry and open to air.  Plan: Followup one month to recheck wounds left lower extremity. The patient was told he could continue his Tylenol and intermittently take ibuprofen if it needed to for pain. I did offer M. an additional narcotic prescription today but he states that he does not want any with constipation issues associated with this.  Ruta Hinds, MD [facility] [place] Office: [phone] Pager: [phone]

## 2014-01-01 DIAGNOSIS — J449 Chronic obstructive pulmonary disease, unspecified: Secondary | ICD-10-CM | POA: Diagnosis not present

## 2014-01-01 DIAGNOSIS — Z8673 Personal history of transient ischemic attack (TIA), and cerebral infarction without residual deficits: Secondary | ICD-10-CM | POA: Diagnosis not present

## 2014-01-01 DIAGNOSIS — Z48812 Encounter for surgical aftercare following surgery on the circulatory system: Secondary | ICD-10-CM | POA: Diagnosis not present

## 2014-01-01 DIAGNOSIS — IMO0001 Reserved for inherently not codable concepts without codable children: Secondary | ICD-10-CM | POA: Diagnosis not present

## 2014-01-01 DIAGNOSIS — M25569 Pain in unspecified knee: Secondary | ICD-10-CM | POA: Diagnosis not present

## 2014-01-01 DIAGNOSIS — I10 Essential (primary) hypertension: Secondary | ICD-10-CM | POA: Diagnosis not present

## 2014-01-04 ENCOUNTER — Other Ambulatory Visit: Payer: Self-pay | Admitting: *Deleted

## 2014-01-04 ENCOUNTER — Encounter: Payer: Self-pay | Admitting: Surgery

## 2014-01-04 ENCOUNTER — Ambulatory Visit (INDEPENDENT_AMBULATORY_CARE_PROVIDER_SITE_OTHER): Payer: Self-pay | Admitting: Surgery

## 2014-01-04 VITALS — BP 109/62 | HR 104 | Temp 99.2°F | Ht 64.0 in | Wt 98.0 lb

## 2014-01-04 DIAGNOSIS — J449 Chronic obstructive pulmonary disease, unspecified: Secondary | ICD-10-CM | POA: Diagnosis not present

## 2014-01-04 DIAGNOSIS — IMO0001 Reserved for inherently not codable concepts without codable children: Secondary | ICD-10-CM | POA: Diagnosis not present

## 2014-01-04 DIAGNOSIS — L98499 Non-pressure chronic ulcer of skin of other sites with unspecified severity: Principal | ICD-10-CM

## 2014-01-04 DIAGNOSIS — I739 Peripheral vascular disease, unspecified: Secondary | ICD-10-CM

## 2014-01-04 DIAGNOSIS — M25569 Pain in unspecified knee: Secondary | ICD-10-CM | POA: Diagnosis not present

## 2014-01-04 DIAGNOSIS — Z48812 Encounter for surgical aftercare following surgery on the circulatory system: Secondary | ICD-10-CM | POA: Diagnosis not present

## 2014-01-04 DIAGNOSIS — Z8673 Personal history of transient ischemic attack (TIA), and cerebral infarction without residual deficits: Secondary | ICD-10-CM | POA: Diagnosis not present

## 2014-01-04 DIAGNOSIS — I10 Essential (primary) hypertension: Secondary | ICD-10-CM | POA: Diagnosis not present

## 2014-01-04 NOTE — Progress Notes (Signed)
The patient comes in today as an unscheduled visit for temperature of 102.0 at home.  He is status post right to left femoral-femoral bypass graft and left femoral popliteal bypass graft for nonhealing wounds.  He was seen recently by [name] who felt like he was making good progress.  He is concerned today about his fevers.  He continues to have a significant amount of dry eschar on his leg.  I trimmed back a lot of this and recommended home health for dressing changes.  He has an excellent pulse within his femoral-femoral graft.  His wounds did bleed appropriately.  There was a trace amount of erythema on the dorsum of his foot which has probably been there for a while and most likely not be explanation for his fevers, however I gave him a prescription for Levaquin to be on the safe side.  I also gave him a refill 40 oxycodone tablets.  I do not think that there is any evidence of graft infection at this time.  He will follow up with [name] in 2 weeks.

## 2014-01-05 DIAGNOSIS — M25569 Pain in unspecified knee: Secondary | ICD-10-CM | POA: Diagnosis not present

## 2014-01-05 DIAGNOSIS — I10 Essential (primary) hypertension: Secondary | ICD-10-CM | POA: Diagnosis not present

## 2014-01-05 DIAGNOSIS — J449 Chronic obstructive pulmonary disease, unspecified: Secondary | ICD-10-CM | POA: Diagnosis not present

## 2014-01-05 DIAGNOSIS — IMO0001 Reserved for inherently not codable concepts without codable children: Secondary | ICD-10-CM | POA: Diagnosis not present

## 2014-01-05 DIAGNOSIS — Z48812 Encounter for surgical aftercare following surgery on the circulatory system: Secondary | ICD-10-CM | POA: Diagnosis not present

## 2014-01-05 DIAGNOSIS — Z8673 Personal history of transient ischemic attack (TIA), and cerebral infarction without residual deficits: Secondary | ICD-10-CM | POA: Diagnosis not present

## 2014-01-06 DIAGNOSIS — Z8673 Personal history of transient ischemic attack (TIA), and cerebral infarction without residual deficits: Secondary | ICD-10-CM | POA: Diagnosis not present

## 2014-01-06 DIAGNOSIS — J449 Chronic obstructive pulmonary disease, unspecified: Secondary | ICD-10-CM | POA: Diagnosis not present

## 2014-01-06 DIAGNOSIS — M25569 Pain in unspecified knee: Secondary | ICD-10-CM | POA: Diagnosis not present

## 2014-01-06 DIAGNOSIS — I10 Essential (primary) hypertension: Secondary | ICD-10-CM | POA: Diagnosis not present

## 2014-01-06 DIAGNOSIS — IMO0001 Reserved for inherently not codable concepts without codable children: Secondary | ICD-10-CM | POA: Diagnosis not present

## 2014-01-06 DIAGNOSIS — Z48812 Encounter for surgical aftercare following surgery on the circulatory system: Secondary | ICD-10-CM | POA: Diagnosis not present

## 2014-01-08 DIAGNOSIS — M25569 Pain in unspecified knee: Secondary | ICD-10-CM | POA: Diagnosis not present

## 2014-01-08 DIAGNOSIS — I10 Essential (primary) hypertension: Secondary | ICD-10-CM | POA: Diagnosis not present

## 2014-01-08 DIAGNOSIS — Z48812 Encounter for surgical aftercare following surgery on the circulatory system: Secondary | ICD-10-CM | POA: Diagnosis not present

## 2014-01-08 DIAGNOSIS — J449 Chronic obstructive pulmonary disease, unspecified: Secondary | ICD-10-CM | POA: Diagnosis not present

## 2014-01-08 DIAGNOSIS — IMO0001 Reserved for inherently not codable concepts without codable children: Secondary | ICD-10-CM | POA: Diagnosis not present

## 2014-01-08 DIAGNOSIS — Z8673 Personal history of transient ischemic attack (TIA), and cerebral infarction without residual deficits: Secondary | ICD-10-CM | POA: Diagnosis not present

## 2014-01-09 DIAGNOSIS — I10 Essential (primary) hypertension: Secondary | ICD-10-CM | POA: Diagnosis not present

## 2014-01-09 DIAGNOSIS — J449 Chronic obstructive pulmonary disease, unspecified: Secondary | ICD-10-CM | POA: Diagnosis not present

## 2014-01-09 DIAGNOSIS — IMO0001 Reserved for inherently not codable concepts without codable children: Secondary | ICD-10-CM | POA: Diagnosis not present

## 2014-01-09 DIAGNOSIS — M25569 Pain in unspecified knee: Secondary | ICD-10-CM | POA: Diagnosis not present

## 2014-01-09 DIAGNOSIS — Z8673 Personal history of transient ischemic attack (TIA), and cerebral infarction without residual deficits: Secondary | ICD-10-CM | POA: Diagnosis not present

## 2014-01-09 DIAGNOSIS — Z48812 Encounter for surgical aftercare following surgery on the circulatory system: Secondary | ICD-10-CM | POA: Diagnosis not present

## 2014-01-10 DIAGNOSIS — IMO0001 Reserved for inherently not codable concepts without codable children: Secondary | ICD-10-CM | POA: Diagnosis not present

## 2014-01-10 DIAGNOSIS — Z8673 Personal history of transient ischemic attack (TIA), and cerebral infarction without residual deficits: Secondary | ICD-10-CM | POA: Diagnosis not present

## 2014-01-10 DIAGNOSIS — J449 Chronic obstructive pulmonary disease, unspecified: Secondary | ICD-10-CM | POA: Diagnosis not present

## 2014-01-10 DIAGNOSIS — I10 Essential (primary) hypertension: Secondary | ICD-10-CM | POA: Diagnosis not present

## 2014-01-10 DIAGNOSIS — Z48812 Encounter for surgical aftercare following surgery on the circulatory system: Secondary | ICD-10-CM | POA: Diagnosis not present

## 2014-01-10 DIAGNOSIS — M25569 Pain in unspecified knee: Secondary | ICD-10-CM | POA: Diagnosis not present

## 2014-01-11 DIAGNOSIS — M25569 Pain in unspecified knee: Secondary | ICD-10-CM | POA: Diagnosis not present

## 2014-01-11 DIAGNOSIS — I10 Essential (primary) hypertension: Secondary | ICD-10-CM | POA: Diagnosis not present

## 2014-01-11 DIAGNOSIS — J449 Chronic obstructive pulmonary disease, unspecified: Secondary | ICD-10-CM | POA: Diagnosis not present

## 2014-01-11 DIAGNOSIS — Z48812 Encounter for surgical aftercare following surgery on the circulatory system: Secondary | ICD-10-CM | POA: Diagnosis not present

## 2014-01-11 DIAGNOSIS — Z8673 Personal history of transient ischemic attack (TIA), and cerebral infarction without residual deficits: Secondary | ICD-10-CM | POA: Diagnosis not present

## 2014-01-11 DIAGNOSIS — IMO0001 Reserved for inherently not codable concepts without codable children: Secondary | ICD-10-CM | POA: Diagnosis not present

## 2014-01-12 ENCOUNTER — Telehealth: Payer: Self-pay

## 2014-01-12 DIAGNOSIS — I10 Essential (primary) hypertension: Secondary | ICD-10-CM | POA: Diagnosis not present

## 2014-01-12 DIAGNOSIS — G8918 Other acute postprocedural pain: Secondary | ICD-10-CM

## 2014-01-12 DIAGNOSIS — M25569 Pain in unspecified knee: Secondary | ICD-10-CM | POA: Diagnosis not present

## 2014-01-12 DIAGNOSIS — Z48812 Encounter for surgical aftercare following surgery on the circulatory system: Secondary | ICD-10-CM | POA: Diagnosis not present

## 2014-01-12 DIAGNOSIS — IMO0001 Reserved for inherently not codable concepts without codable children: Secondary | ICD-10-CM | POA: Diagnosis not present

## 2014-01-12 DIAGNOSIS — J449 Chronic obstructive pulmonary disease, unspecified: Secondary | ICD-10-CM | POA: Diagnosis not present

## 2014-01-12 DIAGNOSIS — Z8673 Personal history of transient ischemic attack (TIA), and cerebral infarction without residual deficits: Secondary | ICD-10-CM | POA: Diagnosis not present

## 2014-01-12 MED ORDER — OXYCODONE-ACETAMINOPHEN 5-325 MG PO TABS: ORAL_TABLET | ORAL | Status: DC

## 2014-01-12 NOTE — Telephone Encounter (Signed)
HH RN called with update on wound status; reported the "peri-wound of left lower leg, in the calf/shin area, has pink tissue".  Reported it looks less inflammed compared to yesterday.  Reported continued "yellow drainage and slough" in the wound bed.  Reported afebrile.  Nurse reported pt. has intermittent episodes of calling-out in pain, and c/o "shooting pain" in the lower left leg/heel area.  Had Rx of Percocet from [place] 2/23, but is out of pain medication.  Next appt. Is Thurs., 3/5.  Will discuss with MD in office.

## 2014-01-12 NOTE — Telephone Encounter (Signed)
Discussed pt's pain with Dr. Donnetta Hutching.  Approved Rx for Percocet, #30, no refills, and to make [name] aware of Rx, when pt. comes in for appt. on 3/5.  Notified pt's wife that Rx will be ready for pick-up at the front desk; agrees with plan.

## 2014-01-13 ENCOUNTER — Encounter: Payer: Self-pay | Admitting: Vascular Surgery

## 2014-01-13 DIAGNOSIS — Z8673 Personal history of transient ischemic attack (TIA), and cerebral infarction without residual deficits: Secondary | ICD-10-CM | POA: Diagnosis not present

## 2014-01-13 DIAGNOSIS — IMO0001 Reserved for inherently not codable concepts without codable children: Secondary | ICD-10-CM | POA: Diagnosis not present

## 2014-01-13 DIAGNOSIS — J449 Chronic obstructive pulmonary disease, unspecified: Secondary | ICD-10-CM | POA: Diagnosis not present

## 2014-01-13 DIAGNOSIS — M25569 Pain in unspecified knee: Secondary | ICD-10-CM | POA: Diagnosis not present

## 2014-01-13 DIAGNOSIS — I10 Essential (primary) hypertension: Secondary | ICD-10-CM | POA: Diagnosis not present

## 2014-01-13 DIAGNOSIS — Z48812 Encounter for surgical aftercare following surgery on the circulatory system: Secondary | ICD-10-CM | POA: Diagnosis not present

## 2014-01-14 ENCOUNTER — Ambulatory Visit (INDEPENDENT_AMBULATORY_CARE_PROVIDER_SITE_OTHER): Payer: Self-pay | Admitting: Vascular Surgery

## 2014-01-14 ENCOUNTER — Encounter: Payer: Self-pay | Admitting: Vascular Surgery

## 2014-01-14 VITALS — BP 134/55 | HR 87 | Temp 98.2°F | Ht 64.0 in | Wt 100.0 lb

## 2014-01-14 DIAGNOSIS — L98499 Non-pressure chronic ulcer of skin of other sites with unspecified severity: Principal | ICD-10-CM

## 2014-01-14 DIAGNOSIS — I739 Peripheral vascular disease, unspecified: Secondary | ICD-10-CM

## 2014-01-14 DIAGNOSIS — G8918 Other acute postprocedural pain: Secondary | ICD-10-CM

## 2014-01-14 MED ORDER — COLLAGENASE 250 UNIT/GM EX OINT: 1.0000 "application " | TOPICAL_OINTMENT | Freq: Every day | CUTANEOUS | Status: DC

## 2014-01-14 MED ORDER — OXYCODONE-ACETAMINOPHEN 5-325 MG PO TABS: ORAL_TABLET | ORAL | Status: DC

## 2014-01-14 NOTE — Progress Notes (Signed)
Patient is a 73 year old male who underwent right left femoral femoral bypass with bilateral common femoral endarterectomies and a left femoral to above-knee popliteal bypass all with prosthetic on January 28. He returns today for further followup. He still has intermittent episodes of pain in the ulcerations in his left leg. He has not had pain in the left foot. He has had no drainage from his incisions. He is working hard with physical therapy to improve his strength. He is not smoking.He currently is taking Tylenol for pain. However he requested some additional pain medication today.  Physical exam:    Filed Vitals:   01/14/14 0945  BP: 134/55  Pulse: 87  Temp: 98.2 F (36.8 C)  TempSrc: Oral  Height: 5\' 4"  (1.626 m)  Weight: 100 lb (45.36 kg)  SpO2: 100%    Extremities: Palpable femoral femoral graft pulse, healing groin incisions Lower extremity: Feet pink warm bilaterally, healing above-knee popliteal incision, large dry eschar sections left pretibial left medial foot left medial malleolus. All these were debridement today  Assessment: Patent bypass grafts still with extensive wounds left lower extremity which hopefully will heal with time. The patient was his left lower extremity once daily with soap and water. Otherwise the wounds will be kept dry and open to air.  Plan: Followup one month to recheck wounds left lower extremity. Prescription given today for standstill once daily on all the wounds of his left upper extremity today. He was also given a prescription for Percocet today. I do not believe he needs antibiotics at this point. He will followup with me in 3 weeks.   Ruta Hinds, MD [facility] [place] Office: [phone] Pager: [phone]

## 2014-01-15 DIAGNOSIS — IMO0001 Reserved for inherently not codable concepts without codable children: Secondary | ICD-10-CM | POA: Diagnosis not present

## 2014-01-15 DIAGNOSIS — M25569 Pain in unspecified knee: Secondary | ICD-10-CM | POA: Diagnosis not present

## 2014-01-15 DIAGNOSIS — Z48812 Encounter for surgical aftercare following surgery on the circulatory system: Secondary | ICD-10-CM | POA: Diagnosis not present

## 2014-01-15 DIAGNOSIS — J449 Chronic obstructive pulmonary disease, unspecified: Secondary | ICD-10-CM | POA: Diagnosis not present

## 2014-01-15 DIAGNOSIS — Z8673 Personal history of transient ischemic attack (TIA), and cerebral infarction without residual deficits: Secondary | ICD-10-CM | POA: Diagnosis not present

## 2014-01-15 DIAGNOSIS — I10 Essential (primary) hypertension: Secondary | ICD-10-CM | POA: Diagnosis not present

## 2014-01-19 DIAGNOSIS — IMO0001 Reserved for inherently not codable concepts without codable children: Secondary | ICD-10-CM | POA: Diagnosis not present

## 2014-01-19 DIAGNOSIS — Z8673 Personal history of transient ischemic attack (TIA), and cerebral infarction without residual deficits: Secondary | ICD-10-CM | POA: Diagnosis not present

## 2014-01-19 DIAGNOSIS — J449 Chronic obstructive pulmonary disease, unspecified: Secondary | ICD-10-CM | POA: Diagnosis not present

## 2014-01-19 DIAGNOSIS — I10 Essential (primary) hypertension: Secondary | ICD-10-CM | POA: Diagnosis not present

## 2014-01-19 DIAGNOSIS — M25569 Pain in unspecified knee: Secondary | ICD-10-CM | POA: Diagnosis not present

## 2014-01-19 DIAGNOSIS — Z48812 Encounter for surgical aftercare following surgery on the circulatory system: Secondary | ICD-10-CM | POA: Diagnosis not present

## 2014-01-20 DIAGNOSIS — M25569 Pain in unspecified knee: Secondary | ICD-10-CM | POA: Diagnosis not present

## 2014-01-20 DIAGNOSIS — Z8673 Personal history of transient ischemic attack (TIA), and cerebral infarction without residual deficits: Secondary | ICD-10-CM | POA: Diagnosis not present

## 2014-01-20 DIAGNOSIS — J449 Chronic obstructive pulmonary disease, unspecified: Secondary | ICD-10-CM | POA: Diagnosis not present

## 2014-01-20 DIAGNOSIS — IMO0001 Reserved for inherently not codable concepts without codable children: Secondary | ICD-10-CM | POA: Diagnosis not present

## 2014-01-20 DIAGNOSIS — Z48812 Encounter for surgical aftercare following surgery on the circulatory system: Secondary | ICD-10-CM | POA: Diagnosis not present

## 2014-01-20 DIAGNOSIS — I10 Essential (primary) hypertension: Secondary | ICD-10-CM | POA: Diagnosis not present

## 2014-01-21 ENCOUNTER — Telehealth: Payer: Self-pay

## 2014-01-21 DIAGNOSIS — J449 Chronic obstructive pulmonary disease, unspecified: Secondary | ICD-10-CM | POA: Diagnosis not present

## 2014-01-21 DIAGNOSIS — Z48812 Encounter for surgical aftercare following surgery on the circulatory system: Secondary | ICD-10-CM | POA: Diagnosis not present

## 2014-01-21 DIAGNOSIS — M25569 Pain in unspecified knee: Secondary | ICD-10-CM | POA: Diagnosis not present

## 2014-01-21 DIAGNOSIS — IMO0001 Reserved for inherently not codable concepts without codable children: Secondary | ICD-10-CM | POA: Diagnosis not present

## 2014-01-21 DIAGNOSIS — Z8673 Personal history of transient ischemic attack (TIA), and cerebral infarction without residual deficits: Secondary | ICD-10-CM | POA: Diagnosis not present

## 2014-01-21 DIAGNOSIS — I10 Essential (primary) hypertension: Secondary | ICD-10-CM | POA: Diagnosis not present

## 2014-01-21 NOTE — Telephone Encounter (Signed)
Phone call rec'd. from pt's wife.  Stated they will be going on vacation next week, and the pt. Will run out of medication.  Stated they will leave early AM 3/19, and return 3/24.  Reported pt. is continuing to have "shooting pain" in the left leg.  Asking to receive enough pain medication to hold him over until he sees [name] on 02/04/14.  Called pt. To discuss his pain.  Stated he has continuous pain in the left lower leg wounds/  Stated he has been taking 6 tablets / day, and has not been very active.  Voiced concern of possible need to take more pain medication, when he starts walking more, while on vacation.  Advised that [name] gave him Percocet # 80 one week ago.  Pt. stated "this is to get me through my vacation."  Discussed w/ Dr. Donnetta Hutching, as [name] not available.  Dr. Donnetta Hutching declined filling any more narcotic pain medication. Advised pt. of Dr. Luther Parody response.  Advised pt. He should try to step down to OTC analgesic since his surgery was 6 weeks ago.  lPt. was persistent, and stated "I don't know what I am supposed to do."  Advised will need to check with [name] next week, when he is available. Will send message to [name].

## 2014-01-22 DIAGNOSIS — I10 Essential (primary) hypertension: Secondary | ICD-10-CM | POA: Diagnosis not present

## 2014-01-22 DIAGNOSIS — IMO0001 Reserved for inherently not codable concepts without codable children: Secondary | ICD-10-CM | POA: Diagnosis not present

## 2014-01-22 DIAGNOSIS — Z48812 Encounter for surgical aftercare following surgery on the circulatory system: Secondary | ICD-10-CM | POA: Diagnosis not present

## 2014-01-22 DIAGNOSIS — Z8673 Personal history of transient ischemic attack (TIA), and cerebral infarction without residual deficits: Secondary | ICD-10-CM | POA: Diagnosis not present

## 2014-01-22 DIAGNOSIS — J449 Chronic obstructive pulmonary disease, unspecified: Secondary | ICD-10-CM | POA: Diagnosis not present

## 2014-01-22 DIAGNOSIS — M25569 Pain in unspecified knee: Secondary | ICD-10-CM | POA: Diagnosis not present

## 2014-01-26 DIAGNOSIS — J449 Chronic obstructive pulmonary disease, unspecified: Secondary | ICD-10-CM | POA: Diagnosis not present

## 2014-01-26 DIAGNOSIS — Z48812 Encounter for surgical aftercare following surgery on the circulatory system: Secondary | ICD-10-CM | POA: Diagnosis not present

## 2014-01-26 DIAGNOSIS — M25569 Pain in unspecified knee: Secondary | ICD-10-CM | POA: Diagnosis not present

## 2014-01-26 DIAGNOSIS — IMO0001 Reserved for inherently not codable concepts without codable children: Secondary | ICD-10-CM | POA: Diagnosis not present

## 2014-01-26 DIAGNOSIS — Z8673 Personal history of transient ischemic attack (TIA), and cerebral infarction without residual deficits: Secondary | ICD-10-CM | POA: Diagnosis not present

## 2014-01-26 DIAGNOSIS — I10 Essential (primary) hypertension: Secondary | ICD-10-CM | POA: Diagnosis not present

## 2014-01-26 NOTE — Telephone Encounter (Signed)
Rec'd recommendation from [name] that if pt. is having that much pain, he needs to come in for an appt. to discuss amputation.  [name] declined giving any additional pain medic., until pt. comes back in for office appt.  Attempted to call both wife and pt.  Left voice message that [name] recommends to schedule appt. to discuss further treatment, and that no pain medication can be given, before pt. comes back to office.

## 2014-01-27 DIAGNOSIS — M25569 Pain in unspecified knee: Secondary | ICD-10-CM | POA: Diagnosis not present

## 2014-01-27 DIAGNOSIS — IMO0001 Reserved for inherently not codable concepts without codable children: Secondary | ICD-10-CM | POA: Diagnosis not present

## 2014-01-27 DIAGNOSIS — J449 Chronic obstructive pulmonary disease, unspecified: Secondary | ICD-10-CM | POA: Diagnosis not present

## 2014-01-27 DIAGNOSIS — I10 Essential (primary) hypertension: Secondary | ICD-10-CM | POA: Diagnosis not present

## 2014-01-27 DIAGNOSIS — Z48812 Encounter for surgical aftercare following surgery on the circulatory system: Secondary | ICD-10-CM | POA: Diagnosis not present

## 2014-01-27 DIAGNOSIS — Z8673 Personal history of transient ischemic attack (TIA), and cerebral infarction without residual deficits: Secondary | ICD-10-CM | POA: Diagnosis not present

## 2014-01-28 ENCOUNTER — Ambulatory Visit: Payer: Medicare Other | Admitting: Vascular Surgery

## 2014-02-01 DIAGNOSIS — Z48812 Encounter for surgical aftercare following surgery on the circulatory system: Secondary | ICD-10-CM | POA: Diagnosis not present

## 2014-02-01 DIAGNOSIS — I10 Essential (primary) hypertension: Secondary | ICD-10-CM | POA: Diagnosis not present

## 2014-02-01 DIAGNOSIS — IMO0001 Reserved for inherently not codable concepts without codable children: Secondary | ICD-10-CM | POA: Diagnosis not present

## 2014-02-01 DIAGNOSIS — J449 Chronic obstructive pulmonary disease, unspecified: Secondary | ICD-10-CM | POA: Diagnosis not present

## 2014-02-01 DIAGNOSIS — M25569 Pain in unspecified knee: Secondary | ICD-10-CM | POA: Diagnosis not present

## 2014-02-01 DIAGNOSIS — Z8673 Personal history of transient ischemic attack (TIA), and cerebral infarction without residual deficits: Secondary | ICD-10-CM | POA: Diagnosis not present

## 2014-02-03 ENCOUNTER — Encounter: Payer: Self-pay | Admitting: Vascular Surgery

## 2014-02-03 DIAGNOSIS — IMO0001 Reserved for inherently not codable concepts without codable children: Secondary | ICD-10-CM | POA: Diagnosis not present

## 2014-02-03 DIAGNOSIS — M25569 Pain in unspecified knee: Secondary | ICD-10-CM | POA: Diagnosis not present

## 2014-02-03 DIAGNOSIS — Z8673 Personal history of transient ischemic attack (TIA), and cerebral infarction without residual deficits: Secondary | ICD-10-CM | POA: Diagnosis not present

## 2014-02-03 DIAGNOSIS — Z48812 Encounter for surgical aftercare following surgery on the circulatory system: Secondary | ICD-10-CM | POA: Diagnosis not present

## 2014-02-03 DIAGNOSIS — J449 Chronic obstructive pulmonary disease, unspecified: Secondary | ICD-10-CM | POA: Diagnosis not present

## 2014-02-03 DIAGNOSIS — I10 Essential (primary) hypertension: Secondary | ICD-10-CM | POA: Diagnosis not present

## 2014-02-04 ENCOUNTER — Ambulatory Visit: Payer: Medicare Other | Admitting: Vascular Surgery

## 2014-02-04 ENCOUNTER — Ambulatory Visit (INDEPENDENT_AMBULATORY_CARE_PROVIDER_SITE_OTHER): Payer: Self-pay | Admitting: Vascular Surgery

## 2014-02-04 ENCOUNTER — Encounter: Payer: Self-pay | Admitting: Vascular Surgery

## 2014-02-04 ENCOUNTER — Other Ambulatory Visit: Payer: Self-pay | Admitting: Physician Assistant

## 2014-02-04 VITALS — BP 150/63 | HR 97 | Ht 64.0 in | Wt 101.0 lb

## 2014-02-04 DIAGNOSIS — L98499 Non-pressure chronic ulcer of skin of other sites with unspecified severity: Principal | ICD-10-CM

## 2014-02-04 DIAGNOSIS — I739 Peripheral vascular disease, unspecified: Secondary | ICD-10-CM

## 2014-02-04 DIAGNOSIS — G8918 Other acute postprocedural pain: Secondary | ICD-10-CM

## 2014-02-04 MED ORDER — OXYCODONE HCL 5 MG PO TABS: 5.0000 mg | ORAL_TABLET | Freq: Three times a day (TID) | ORAL | Status: DC | PRN

## 2014-02-04 MED ORDER — OXYCODONE-ACETAMINOPHEN 5-325 MG PO TABS: ORAL_TABLET | ORAL | Status: DC

## 2014-02-04 NOTE — Progress Notes (Addendum)
[facility] [place]  Subjective  - Doing well with the wound care since we started santyl.  Recent femoral-femoral bypass with left femoral to above-knee popliteal bypass all with prosthetic.  He still complains of burning numbness tingling in his left foot.   Objective 150/63 97     100%  Left femoral graft palpable pul;se, dp palpable pulse. Cap refill brisk. Anterior shin black eschar  Medial malleolus yellow eschar- debrided in office today Surgical incisions are all well healed.  Assessment/Planning: Right left femoral femoral bypass with bilateral common femoral endarterectomies and a left femoral to above-knee popliteal bypass all with prosthetic on January 28.  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 The patient was started on Neurontin today. We will also schedule an appointment for pain management evaluation.  Continue washing with soap and water daily.  Apply santyl to all wounds daily. F/U in 3 weeks for wound check  Laurence Slate [facility] 02/04/2014 4:25 PM   History exam details as above. Still has pain in the left foot most likely neuropathic grafts are patent. Left foot is well-perfused. Wounds on left pretibial region which were the reason for bypass are still slowly healing  Ruta Hinds, MD [facility] [place]: [phone] Pager: [phone]

## 2014-02-05 DIAGNOSIS — I10 Essential (primary) hypertension: Secondary | ICD-10-CM | POA: Diagnosis not present

## 2014-02-05 DIAGNOSIS — Z8673 Personal history of transient ischemic attack (TIA), and cerebral infarction without residual deficits: Secondary | ICD-10-CM | POA: Diagnosis not present

## 2014-02-05 DIAGNOSIS — Z48812 Encounter for surgical aftercare following surgery on the circulatory system: Secondary | ICD-10-CM | POA: Diagnosis not present

## 2014-02-05 DIAGNOSIS — M25569 Pain in unspecified knee: Secondary | ICD-10-CM | POA: Diagnosis not present

## 2014-02-05 DIAGNOSIS — J449 Chronic obstructive pulmonary disease, unspecified: Secondary | ICD-10-CM | POA: Diagnosis not present

## 2014-02-05 DIAGNOSIS — IMO0001 Reserved for inherently not codable concepts without codable children: Secondary | ICD-10-CM | POA: Diagnosis not present

## 2014-02-05 NOTE — Addendum Note (Signed)
Addended by: Dorthula Rue L on: 02/05/2014 10:55 AM   Modules accepted: Orders

## 2014-02-08 DIAGNOSIS — Z8673 Personal history of transient ischemic attack (TIA), and cerebral infarction without residual deficits: Secondary | ICD-10-CM | POA: Diagnosis not present

## 2014-02-08 DIAGNOSIS — I10 Essential (primary) hypertension: Secondary | ICD-10-CM | POA: Diagnosis not present

## 2014-02-08 DIAGNOSIS — J449 Chronic obstructive pulmonary disease, unspecified: Secondary | ICD-10-CM | POA: Diagnosis not present

## 2014-02-08 DIAGNOSIS — IMO0001 Reserved for inherently not codable concepts without codable children: Secondary | ICD-10-CM | POA: Diagnosis not present

## 2014-02-08 DIAGNOSIS — Z48812 Encounter for surgical aftercare following surgery on the circulatory system: Secondary | ICD-10-CM | POA: Diagnosis not present

## 2014-02-08 DIAGNOSIS — M25569 Pain in unspecified knee: Secondary | ICD-10-CM | POA: Diagnosis not present

## 2014-02-10 DIAGNOSIS — I10 Essential (primary) hypertension: Secondary | ICD-10-CM | POA: Diagnosis not present

## 2014-02-10 DIAGNOSIS — M25569 Pain in unspecified knee: Secondary | ICD-10-CM | POA: Diagnosis not present

## 2014-02-10 DIAGNOSIS — Z48812 Encounter for surgical aftercare following surgery on the circulatory system: Secondary | ICD-10-CM | POA: Diagnosis not present

## 2014-02-10 DIAGNOSIS — IMO0001 Reserved for inherently not codable concepts without codable children: Secondary | ICD-10-CM | POA: Diagnosis not present

## 2014-02-10 DIAGNOSIS — Z8673 Personal history of transient ischemic attack (TIA), and cerebral infarction without residual deficits: Secondary | ICD-10-CM | POA: Diagnosis not present

## 2014-02-10 DIAGNOSIS — J449 Chronic obstructive pulmonary disease, unspecified: Secondary | ICD-10-CM | POA: Diagnosis not present

## 2014-02-11 DIAGNOSIS — J449 Chronic obstructive pulmonary disease, unspecified: Secondary | ICD-10-CM | POA: Diagnosis not present

## 2014-02-11 DIAGNOSIS — Z8673 Personal history of transient ischemic attack (TIA), and cerebral infarction without residual deficits: Secondary | ICD-10-CM | POA: Diagnosis not present

## 2014-02-11 DIAGNOSIS — M25569 Pain in unspecified knee: Secondary | ICD-10-CM | POA: Diagnosis not present

## 2014-02-11 DIAGNOSIS — I10 Essential (primary) hypertension: Secondary | ICD-10-CM | POA: Diagnosis not present

## 2014-02-11 DIAGNOSIS — IMO0001 Reserved for inherently not codable concepts without codable children: Secondary | ICD-10-CM | POA: Diagnosis not present

## 2014-02-11 DIAGNOSIS — Z48812 Encounter for surgical aftercare following surgery on the circulatory system: Secondary | ICD-10-CM | POA: Diagnosis not present

## 2014-02-12 DIAGNOSIS — Z48812 Encounter for surgical aftercare following surgery on the circulatory system: Secondary | ICD-10-CM | POA: Diagnosis not present

## 2014-02-12 DIAGNOSIS — M25569 Pain in unspecified knee: Secondary | ICD-10-CM | POA: Diagnosis not present

## 2014-02-12 DIAGNOSIS — I10 Essential (primary) hypertension: Secondary | ICD-10-CM | POA: Diagnosis not present

## 2014-02-12 DIAGNOSIS — Z8673 Personal history of transient ischemic attack (TIA), and cerebral infarction without residual deficits: Secondary | ICD-10-CM | POA: Diagnosis not present

## 2014-02-12 DIAGNOSIS — J449 Chronic obstructive pulmonary disease, unspecified: Secondary | ICD-10-CM | POA: Diagnosis not present

## 2014-02-12 DIAGNOSIS — IMO0001 Reserved for inherently not codable concepts without codable children: Secondary | ICD-10-CM | POA: Diagnosis not present

## 2014-02-16 DIAGNOSIS — L97309 Non-pressure chronic ulcer of unspecified ankle with unspecified severity: Secondary | ICD-10-CM | POA: Diagnosis not present

## 2014-02-16 DIAGNOSIS — L97809 Non-pressure chronic ulcer of other part of unspecified lower leg with unspecified severity: Secondary | ICD-10-CM | POA: Diagnosis not present

## 2014-02-16 DIAGNOSIS — Z48812 Encounter for surgical aftercare following surgery on the circulatory system: Secondary | ICD-10-CM | POA: Diagnosis not present

## 2014-02-16 DIAGNOSIS — I10 Essential (primary) hypertension: Secondary | ICD-10-CM | POA: Diagnosis not present

## 2014-02-16 DIAGNOSIS — M25569 Pain in unspecified knee: Secondary | ICD-10-CM | POA: Diagnosis not present

## 2014-02-16 DIAGNOSIS — Z8673 Personal history of transient ischemic attack (TIA), and cerebral infarction without residual deficits: Secondary | ICD-10-CM | POA: Diagnosis not present

## 2014-02-16 DIAGNOSIS — L97209 Non-pressure chronic ulcer of unspecified calf with unspecified severity: Secondary | ICD-10-CM | POA: Diagnosis not present

## 2014-02-16 DIAGNOSIS — J449 Chronic obstructive pulmonary disease, unspecified: Secondary | ICD-10-CM | POA: Diagnosis not present

## 2014-02-17 DIAGNOSIS — L97209 Non-pressure chronic ulcer of unspecified calf with unspecified severity: Secondary | ICD-10-CM | POA: Diagnosis not present

## 2014-02-17 DIAGNOSIS — L97809 Non-pressure chronic ulcer of other part of unspecified lower leg with unspecified severity: Secondary | ICD-10-CM | POA: Diagnosis not present

## 2014-02-17 DIAGNOSIS — J449 Chronic obstructive pulmonary disease, unspecified: Secondary | ICD-10-CM | POA: Diagnosis not present

## 2014-02-17 DIAGNOSIS — M25569 Pain in unspecified knee: Secondary | ICD-10-CM | POA: Diagnosis not present

## 2014-02-17 DIAGNOSIS — Z48812 Encounter for surgical aftercare following surgery on the circulatory system: Secondary | ICD-10-CM | POA: Diagnosis not present

## 2014-02-17 DIAGNOSIS — L97309 Non-pressure chronic ulcer of unspecified ankle with unspecified severity: Secondary | ICD-10-CM | POA: Diagnosis not present

## 2014-02-19 DIAGNOSIS — E785 Hyperlipidemia, unspecified: Secondary | ICD-10-CM | POA: Diagnosis not present

## 2014-02-19 DIAGNOSIS — L97809 Non-pressure chronic ulcer of other part of unspecified lower leg with unspecified severity: Secondary | ICD-10-CM | POA: Diagnosis not present

## 2014-02-19 DIAGNOSIS — L97209 Non-pressure chronic ulcer of unspecified calf with unspecified severity: Secondary | ICD-10-CM | POA: Diagnosis not present

## 2014-02-19 DIAGNOSIS — L97309 Non-pressure chronic ulcer of unspecified ankle with unspecified severity: Secondary | ICD-10-CM | POA: Diagnosis not present

## 2014-02-19 DIAGNOSIS — Z48812 Encounter for surgical aftercare following surgery on the circulatory system: Secondary | ICD-10-CM | POA: Diagnosis not present

## 2014-02-19 DIAGNOSIS — I10 Essential (primary) hypertension: Secondary | ICD-10-CM | POA: Diagnosis not present

## 2014-02-19 DIAGNOSIS — J449 Chronic obstructive pulmonary disease, unspecified: Secondary | ICD-10-CM | POA: Diagnosis not present

## 2014-02-19 DIAGNOSIS — M25569 Pain in unspecified knee: Secondary | ICD-10-CM | POA: Diagnosis not present

## 2014-02-20 DIAGNOSIS — F519 Sleep disorder not due to a substance or known physiological condition, unspecified: Secondary | ICD-10-CM | POA: Diagnosis not present

## 2014-02-20 DIAGNOSIS — G894 Chronic pain syndrome: Secondary | ICD-10-CM | POA: Diagnosis not present

## 2014-02-20 DIAGNOSIS — M961 Postlaminectomy syndrome, not elsewhere classified: Secondary | ICD-10-CM | POA: Diagnosis not present

## 2014-02-20 DIAGNOSIS — Z79899 Other long term (current) drug therapy: Secondary | ICD-10-CM | POA: Diagnosis not present

## 2014-02-20 DIAGNOSIS — R42 Dizziness and giddiness: Secondary | ICD-10-CM | POA: Diagnosis not present

## 2014-02-20 DIAGNOSIS — G541 Lumbosacral plexus disorders: Secondary | ICD-10-CM | POA: Diagnosis not present

## 2014-02-20 DIAGNOSIS — R209 Unspecified disturbances of skin sensation: Secondary | ICD-10-CM | POA: Diagnosis not present

## 2014-02-22 DIAGNOSIS — Z48812 Encounter for surgical aftercare following surgery on the circulatory system: Secondary | ICD-10-CM | POA: Diagnosis not present

## 2014-02-22 DIAGNOSIS — M25569 Pain in unspecified knee: Secondary | ICD-10-CM | POA: Diagnosis not present

## 2014-02-22 DIAGNOSIS — L97809 Non-pressure chronic ulcer of other part of unspecified lower leg with unspecified severity: Secondary | ICD-10-CM | POA: Diagnosis not present

## 2014-02-22 DIAGNOSIS — L97209 Non-pressure chronic ulcer of unspecified calf with unspecified severity: Secondary | ICD-10-CM | POA: Diagnosis not present

## 2014-02-22 DIAGNOSIS — L97309 Non-pressure chronic ulcer of unspecified ankle with unspecified severity: Secondary | ICD-10-CM | POA: Diagnosis not present

## 2014-02-22 DIAGNOSIS — J449 Chronic obstructive pulmonary disease, unspecified: Secondary | ICD-10-CM | POA: Diagnosis not present

## 2014-02-23 DIAGNOSIS — J449 Chronic obstructive pulmonary disease, unspecified: Secondary | ICD-10-CM | POA: Diagnosis not present

## 2014-02-23 DIAGNOSIS — L97209 Non-pressure chronic ulcer of unspecified calf with unspecified severity: Secondary | ICD-10-CM | POA: Diagnosis not present

## 2014-02-23 DIAGNOSIS — Z48812 Encounter for surgical aftercare following surgery on the circulatory system: Secondary | ICD-10-CM | POA: Diagnosis not present

## 2014-02-23 DIAGNOSIS — L97309 Non-pressure chronic ulcer of unspecified ankle with unspecified severity: Secondary | ICD-10-CM | POA: Diagnosis not present

## 2014-02-23 DIAGNOSIS — M25569 Pain in unspecified knee: Secondary | ICD-10-CM | POA: Diagnosis not present

## 2014-02-23 DIAGNOSIS — L97809 Non-pressure chronic ulcer of other part of unspecified lower leg with unspecified severity: Secondary | ICD-10-CM | POA: Diagnosis not present

## 2014-02-24 ENCOUNTER — Encounter: Payer: Self-pay | Admitting: Vascular Surgery

## 2014-02-24 DIAGNOSIS — Z48812 Encounter for surgical aftercare following surgery on the circulatory system: Secondary | ICD-10-CM | POA: Diagnosis not present

## 2014-02-24 DIAGNOSIS — L97209 Non-pressure chronic ulcer of unspecified calf with unspecified severity: Secondary | ICD-10-CM | POA: Diagnosis not present

## 2014-02-24 DIAGNOSIS — J449 Chronic obstructive pulmonary disease, unspecified: Secondary | ICD-10-CM | POA: Diagnosis not present

## 2014-02-24 DIAGNOSIS — L97309 Non-pressure chronic ulcer of unspecified ankle with unspecified severity: Secondary | ICD-10-CM | POA: Diagnosis not present

## 2014-02-24 DIAGNOSIS — L97809 Non-pressure chronic ulcer of other part of unspecified lower leg with unspecified severity: Secondary | ICD-10-CM | POA: Diagnosis not present

## 2014-02-24 DIAGNOSIS — M25569 Pain in unspecified knee: Secondary | ICD-10-CM | POA: Diagnosis not present

## 2014-02-25 ENCOUNTER — Encounter: Payer: Self-pay | Admitting: Vascular Surgery

## 2014-02-25 ENCOUNTER — Ambulatory Visit (INDEPENDENT_AMBULATORY_CARE_PROVIDER_SITE_OTHER): Payer: Self-pay | Admitting: Vascular Surgery

## 2014-02-25 VITALS — BP 147/55 | HR 99 | Ht 64.0 in | Wt 108.0 lb

## 2014-02-25 DIAGNOSIS — L98499 Non-pressure chronic ulcer of skin of other sites with unspecified severity: Principal | ICD-10-CM

## 2014-02-25 DIAGNOSIS — I739 Peripheral vascular disease, unspecified: Secondary | ICD-10-CM

## 2014-02-25 MED ORDER — COLLAGENASE 250 UNIT/GM EX OINT
1.0000 | TOPICAL_OINTMENT | Freq: Every day | CUTANEOUS | Status: DC
Start: 2014-02-25 — End: 2014-03-25

## 2014-02-25 NOTE — Progress Notes (Signed)
[facility]  Doing well with the wound care since we started santyl.  Recent femoral-femoral bypass with left femoral to above-knee popliteal bypass all with prosthetic January 28.  He still complains of burning numbness tingling in his left foot. He is now being followed for this at the pain clinic.   Objective   Filed Vitals:   02/25/14 1530  BP: 147/55  Pulse: 99  Height: 5\' 4"  (1.626 m)  Weight: 108 lb (48.988 kg)  SpO2: 97%   Left femoral graft palpable pulse, dp palpable pulse. Cap refill brisk. Left pretibial area black eschar   7 x 4 cm debrided back to healthy bleeding tissue today lateral calf similar area of eschar debrided Medial malleolus yellow eschar- debrided in office today Surgical incisions are all well healed.  Assessment/Planning: Right left femoral femoral bypass with bilateral common femoral endarterectomies and a left femoral to above-knee popliteal bypass all with prosthetic on January 28.   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  Continue washing with soap and water daily.  Apply santyl to all wounds daily.  Still has pain in the left foot most likely neuropathic grafts are patent. Left foot is well-perfused. Wounds on left pretibial region which were the reason for bypass are still slowly healing  Followup one month  Ruta Hinds, MD [facility] [place] Office: [phone] Pager: [phone]

## 2014-02-26 DIAGNOSIS — Z48812 Encounter for surgical aftercare following surgery on the circulatory system: Secondary | ICD-10-CM | POA: Diagnosis not present

## 2014-02-26 DIAGNOSIS — J449 Chronic obstructive pulmonary disease, unspecified: Secondary | ICD-10-CM | POA: Diagnosis not present

## 2014-02-26 DIAGNOSIS — M25569 Pain in unspecified knee: Secondary | ICD-10-CM | POA: Diagnosis not present

## 2014-02-26 DIAGNOSIS — L97309 Non-pressure chronic ulcer of unspecified ankle with unspecified severity: Secondary | ICD-10-CM | POA: Diagnosis not present

## 2014-02-26 DIAGNOSIS — L97209 Non-pressure chronic ulcer of unspecified calf with unspecified severity: Secondary | ICD-10-CM | POA: Diagnosis not present

## 2014-02-26 DIAGNOSIS — L97809 Non-pressure chronic ulcer of other part of unspecified lower leg with unspecified severity: Secondary | ICD-10-CM | POA: Diagnosis not present

## 2014-03-01 DIAGNOSIS — L97209 Non-pressure chronic ulcer of unspecified calf with unspecified severity: Secondary | ICD-10-CM | POA: Diagnosis not present

## 2014-03-01 DIAGNOSIS — L97309 Non-pressure chronic ulcer of unspecified ankle with unspecified severity: Secondary | ICD-10-CM | POA: Diagnosis not present

## 2014-03-01 DIAGNOSIS — Z48812 Encounter for surgical aftercare following surgery on the circulatory system: Secondary | ICD-10-CM | POA: Diagnosis not present

## 2014-03-01 DIAGNOSIS — J449 Chronic obstructive pulmonary disease, unspecified: Secondary | ICD-10-CM | POA: Diagnosis not present

## 2014-03-01 DIAGNOSIS — M25569 Pain in unspecified knee: Secondary | ICD-10-CM | POA: Diagnosis not present

## 2014-03-01 DIAGNOSIS — L97809 Non-pressure chronic ulcer of other part of unspecified lower leg with unspecified severity: Secondary | ICD-10-CM | POA: Diagnosis not present

## 2014-03-02 DIAGNOSIS — L97309 Non-pressure chronic ulcer of unspecified ankle with unspecified severity: Secondary | ICD-10-CM | POA: Diagnosis not present

## 2014-03-02 DIAGNOSIS — L97809 Non-pressure chronic ulcer of other part of unspecified lower leg with unspecified severity: Secondary | ICD-10-CM | POA: Diagnosis not present

## 2014-03-02 DIAGNOSIS — J449 Chronic obstructive pulmonary disease, unspecified: Secondary | ICD-10-CM | POA: Diagnosis not present

## 2014-03-02 DIAGNOSIS — L97209 Non-pressure chronic ulcer of unspecified calf with unspecified severity: Secondary | ICD-10-CM | POA: Diagnosis not present

## 2014-03-02 DIAGNOSIS — Z48812 Encounter for surgical aftercare following surgery on the circulatory system: Secondary | ICD-10-CM | POA: Diagnosis not present

## 2014-03-02 DIAGNOSIS — M25569 Pain in unspecified knee: Secondary | ICD-10-CM | POA: Diagnosis not present

## 2014-03-03 DIAGNOSIS — L97209 Non-pressure chronic ulcer of unspecified calf with unspecified severity: Secondary | ICD-10-CM | POA: Diagnosis not present

## 2014-03-03 DIAGNOSIS — M25569 Pain in unspecified knee: Secondary | ICD-10-CM | POA: Diagnosis not present

## 2014-03-03 DIAGNOSIS — L97809 Non-pressure chronic ulcer of other part of unspecified lower leg with unspecified severity: Secondary | ICD-10-CM | POA: Diagnosis not present

## 2014-03-03 DIAGNOSIS — L97309 Non-pressure chronic ulcer of unspecified ankle with unspecified severity: Secondary | ICD-10-CM | POA: Diagnosis not present

## 2014-03-03 DIAGNOSIS — Z48812 Encounter for surgical aftercare following surgery on the circulatory system: Secondary | ICD-10-CM | POA: Diagnosis not present

## 2014-03-03 DIAGNOSIS — J449 Chronic obstructive pulmonary disease, unspecified: Secondary | ICD-10-CM | POA: Diagnosis not present

## 2014-03-04 DIAGNOSIS — M5430 Sciatica, unspecified side: Secondary | ICD-10-CM | POA: Diagnosis not present

## 2014-03-04 DIAGNOSIS — M545 Low back pain, unspecified: Secondary | ICD-10-CM | POA: Diagnosis not present

## 2014-03-04 DIAGNOSIS — M533 Sacrococcygeal disorders, not elsewhere classified: Secondary | ICD-10-CM | POA: Diagnosis not present

## 2014-03-04 DIAGNOSIS — Z79899 Other long term (current) drug therapy: Secondary | ICD-10-CM | POA: Diagnosis not present

## 2014-03-08 DIAGNOSIS — L97809 Non-pressure chronic ulcer of other part of unspecified lower leg with unspecified severity: Secondary | ICD-10-CM | POA: Diagnosis not present

## 2014-03-08 DIAGNOSIS — J449 Chronic obstructive pulmonary disease, unspecified: Secondary | ICD-10-CM | POA: Diagnosis not present

## 2014-03-08 DIAGNOSIS — Z48812 Encounter for surgical aftercare following surgery on the circulatory system: Secondary | ICD-10-CM | POA: Diagnosis not present

## 2014-03-08 DIAGNOSIS — L97209 Non-pressure chronic ulcer of unspecified calf with unspecified severity: Secondary | ICD-10-CM | POA: Diagnosis not present

## 2014-03-08 DIAGNOSIS — L97309 Non-pressure chronic ulcer of unspecified ankle with unspecified severity: Secondary | ICD-10-CM | POA: Diagnosis not present

## 2014-03-08 DIAGNOSIS — M25569 Pain in unspecified knee: Secondary | ICD-10-CM | POA: Diagnosis not present

## 2014-03-10 DIAGNOSIS — M25569 Pain in unspecified knee: Secondary | ICD-10-CM | POA: Diagnosis not present

## 2014-03-10 DIAGNOSIS — L97309 Non-pressure chronic ulcer of unspecified ankle with unspecified severity: Secondary | ICD-10-CM | POA: Diagnosis not present

## 2014-03-10 DIAGNOSIS — J449 Chronic obstructive pulmonary disease, unspecified: Secondary | ICD-10-CM | POA: Diagnosis not present

## 2014-03-10 DIAGNOSIS — L97809 Non-pressure chronic ulcer of other part of unspecified lower leg with unspecified severity: Secondary | ICD-10-CM | POA: Diagnosis not present

## 2014-03-10 DIAGNOSIS — L97209 Non-pressure chronic ulcer of unspecified calf with unspecified severity: Secondary | ICD-10-CM | POA: Diagnosis not present

## 2014-03-10 DIAGNOSIS — Z48812 Encounter for surgical aftercare following surgery on the circulatory system: Secondary | ICD-10-CM | POA: Diagnosis not present

## 2014-03-12 DIAGNOSIS — M25569 Pain in unspecified knee: Secondary | ICD-10-CM | POA: Diagnosis not present

## 2014-03-12 DIAGNOSIS — L97309 Non-pressure chronic ulcer of unspecified ankle with unspecified severity: Secondary | ICD-10-CM | POA: Diagnosis not present

## 2014-03-12 DIAGNOSIS — J449 Chronic obstructive pulmonary disease, unspecified: Secondary | ICD-10-CM | POA: Diagnosis not present

## 2014-03-12 DIAGNOSIS — L97209 Non-pressure chronic ulcer of unspecified calf with unspecified severity: Secondary | ICD-10-CM | POA: Diagnosis not present

## 2014-03-12 DIAGNOSIS — Z48812 Encounter for surgical aftercare following surgery on the circulatory system: Secondary | ICD-10-CM | POA: Diagnosis not present

## 2014-03-12 DIAGNOSIS — L97809 Non-pressure chronic ulcer of other part of unspecified lower leg with unspecified severity: Secondary | ICD-10-CM | POA: Diagnosis not present

## 2014-03-16 DIAGNOSIS — Z48812 Encounter for surgical aftercare following surgery on the circulatory system: Secondary | ICD-10-CM | POA: Diagnosis not present

## 2014-03-16 DIAGNOSIS — M25569 Pain in unspecified knee: Secondary | ICD-10-CM | POA: Diagnosis not present

## 2014-03-16 DIAGNOSIS — J449 Chronic obstructive pulmonary disease, unspecified: Secondary | ICD-10-CM | POA: Diagnosis not present

## 2014-03-16 DIAGNOSIS — L97809 Non-pressure chronic ulcer of other part of unspecified lower leg with unspecified severity: Secondary | ICD-10-CM | POA: Diagnosis not present

## 2014-03-16 DIAGNOSIS — L97209 Non-pressure chronic ulcer of unspecified calf with unspecified severity: Secondary | ICD-10-CM | POA: Diagnosis not present

## 2014-03-16 DIAGNOSIS — L97309 Non-pressure chronic ulcer of unspecified ankle with unspecified severity: Secondary | ICD-10-CM | POA: Diagnosis not present

## 2014-03-18 DIAGNOSIS — Z48812 Encounter for surgical aftercare following surgery on the circulatory system: Secondary | ICD-10-CM | POA: Diagnosis not present

## 2014-03-18 DIAGNOSIS — L97309 Non-pressure chronic ulcer of unspecified ankle with unspecified severity: Secondary | ICD-10-CM | POA: Diagnosis not present

## 2014-03-18 DIAGNOSIS — L97209 Non-pressure chronic ulcer of unspecified calf with unspecified severity: Secondary | ICD-10-CM | POA: Diagnosis not present

## 2014-03-18 DIAGNOSIS — J449 Chronic obstructive pulmonary disease, unspecified: Secondary | ICD-10-CM | POA: Diagnosis not present

## 2014-03-18 DIAGNOSIS — M25569 Pain in unspecified knee: Secondary | ICD-10-CM | POA: Diagnosis not present

## 2014-03-18 DIAGNOSIS — L97809 Non-pressure chronic ulcer of other part of unspecified lower leg with unspecified severity: Secondary | ICD-10-CM | POA: Diagnosis not present

## 2014-03-23 DIAGNOSIS — L97209 Non-pressure chronic ulcer of unspecified calf with unspecified severity: Secondary | ICD-10-CM | POA: Diagnosis not present

## 2014-03-23 DIAGNOSIS — M25569 Pain in unspecified knee: Secondary | ICD-10-CM | POA: Diagnosis not present

## 2014-03-23 DIAGNOSIS — J449 Chronic obstructive pulmonary disease, unspecified: Secondary | ICD-10-CM | POA: Diagnosis not present

## 2014-03-23 DIAGNOSIS — L97309 Non-pressure chronic ulcer of unspecified ankle with unspecified severity: Secondary | ICD-10-CM | POA: Diagnosis not present

## 2014-03-23 DIAGNOSIS — L97809 Non-pressure chronic ulcer of other part of unspecified lower leg with unspecified severity: Secondary | ICD-10-CM | POA: Diagnosis not present

## 2014-03-23 DIAGNOSIS — Z48812 Encounter for surgical aftercare following surgery on the circulatory system: Secondary | ICD-10-CM | POA: Diagnosis not present

## 2014-03-24 DIAGNOSIS — E785 Hyperlipidemia, unspecified: Secondary | ICD-10-CM | POA: Diagnosis not present

## 2014-03-24 DIAGNOSIS — I10 Essential (primary) hypertension: Secondary | ICD-10-CM | POA: Diagnosis not present

## 2014-03-25 ENCOUNTER — Other Ambulatory Visit: Payer: Self-pay | Admitting: *Deleted

## 2014-03-25 DIAGNOSIS — L97209 Non-pressure chronic ulcer of unspecified calf with unspecified severity: Secondary | ICD-10-CM | POA: Diagnosis not present

## 2014-03-25 DIAGNOSIS — Z48812 Encounter for surgical aftercare following surgery on the circulatory system: Secondary | ICD-10-CM | POA: Diagnosis not present

## 2014-03-25 DIAGNOSIS — L97809 Non-pressure chronic ulcer of other part of unspecified lower leg with unspecified severity: Secondary | ICD-10-CM | POA: Diagnosis not present

## 2014-03-25 DIAGNOSIS — J449 Chronic obstructive pulmonary disease, unspecified: Secondary | ICD-10-CM | POA: Diagnosis not present

## 2014-03-25 DIAGNOSIS — M25569 Pain in unspecified knee: Secondary | ICD-10-CM | POA: Diagnosis not present

## 2014-03-25 DIAGNOSIS — L97309 Non-pressure chronic ulcer of unspecified ankle with unspecified severity: Secondary | ICD-10-CM | POA: Diagnosis not present

## 2014-03-25 DIAGNOSIS — T8130XA Disruption of wound, unspecified, initial encounter: Secondary | ICD-10-CM

## 2014-03-25 MED ORDER — COLLAGENASE 250 UNIT/GM EX OINT: 1.0000 "application " | TOPICAL_OINTMENT | Freq: Every day | CUTANEOUS | Status: DC

## 2014-03-30 DIAGNOSIS — L97809 Non-pressure chronic ulcer of other part of unspecified lower leg with unspecified severity: Secondary | ICD-10-CM | POA: Diagnosis not present

## 2014-03-30 DIAGNOSIS — J449 Chronic obstructive pulmonary disease, unspecified: Secondary | ICD-10-CM | POA: Diagnosis not present

## 2014-03-30 DIAGNOSIS — L97209 Non-pressure chronic ulcer of unspecified calf with unspecified severity: Secondary | ICD-10-CM | POA: Diagnosis not present

## 2014-03-30 DIAGNOSIS — Z48812 Encounter for surgical aftercare following surgery on the circulatory system: Secondary | ICD-10-CM | POA: Diagnosis not present

## 2014-03-30 DIAGNOSIS — L97309 Non-pressure chronic ulcer of unspecified ankle with unspecified severity: Secondary | ICD-10-CM | POA: Diagnosis not present

## 2014-03-30 DIAGNOSIS — M25569 Pain in unspecified knee: Secondary | ICD-10-CM | POA: Diagnosis not present

## 2014-03-31 ENCOUNTER — Encounter: Payer: Self-pay | Admitting: Vascular Surgery

## 2014-04-01 ENCOUNTER — Encounter: Payer: Self-pay | Admitting: Vascular Surgery

## 2014-04-01 ENCOUNTER — Ambulatory Visit (INDEPENDENT_AMBULATORY_CARE_PROVIDER_SITE_OTHER): Payer: Medicare Other | Admitting: Vascular Surgery

## 2014-04-01 VITALS — BP 143/50 | HR 79 | Ht 64.0 in | Wt 105.4 lb

## 2014-04-01 DIAGNOSIS — Z48812 Encounter for surgical aftercare following surgery on the circulatory system: Secondary | ICD-10-CM

## 2014-04-01 DIAGNOSIS — G894 Chronic pain syndrome: Secondary | ICD-10-CM | POA: Diagnosis not present

## 2014-04-01 DIAGNOSIS — I739 Peripheral vascular disease, unspecified: Secondary | ICD-10-CM | POA: Diagnosis not present

## 2014-04-01 DIAGNOSIS — L98499 Non-pressure chronic ulcer of skin of other sites with unspecified severity: Principal | ICD-10-CM

## 2014-04-01 DIAGNOSIS — Z79899 Other long term (current) drug therapy: Secondary | ICD-10-CM | POA: Diagnosis not present

## 2014-04-01 DIAGNOSIS — M79609 Pain in unspecified limb: Secondary | ICD-10-CM | POA: Diagnosis not present

## 2014-04-01 DIAGNOSIS — M545 Low back pain, unspecified: Secondary | ICD-10-CM | POA: Diagnosis not present

## 2014-04-01 DIAGNOSIS — M533 Sacrococcygeal disorders, not elsewhere classified: Secondary | ICD-10-CM | POA: Diagnosis not present

## 2014-04-01 DIAGNOSIS — M5430 Sciatica, unspecified side: Secondary | ICD-10-CM | POA: Diagnosis not present

## 2014-04-01 NOTE — Progress Notes (Signed)
[facility]  Doing well with the wound care since we started santyl.  Recent femoral-femoral bypass with left femoral to above-knee popliteal bypass all with prosthetic January 28.  He still complains of burning numbness tingling in his left foot. He is now being followed for this at the pain clinic.   Objective  Filed Vitals:   04/01/14 0856  BP: 143/50  Pulse: 79  Height: 5\' 4"  (1.626 m)  Weight: 105 lb 6.4 oz (47.809 kg)  SpO2: 94%   Left femoral graft palpable pulse, dp palpable pulse. Cap refill brisk. Left pretibial area  5 x 3 cm debrided back to healthy pink skin today lateral calf similar area of dry eschar debrided Medial malleolus yellow eschar- debrided in office today 2x2 cm pink bleeding granulation tissue beneath this Surgical incisions are all well healed.  Assessment/Planning: Right left femoral femoral bypass with bilateral common femoral endarterectomies and a left femoral to above-knee popliteal bypass all with prosthetic on January 28.   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   Continue washing with soap and water daily.  Apply santyl to all wounds daily.  Still has pain in the left foot most likely neuropathic grafts are patent. Left foot is well-perfused. Wounds on left pretibial region which were the reason for bypass are still slowly healing  Followup July for graft scan  Ruta Hinds, MD [facility] [place]: [phone] Pager: [phone]

## 2014-04-01 NOTE — Addendum Note (Signed)
Addended by: Mena Goes on: 04/01/2014 04:12 PM   Modules accepted: Orders

## 2014-04-02 DIAGNOSIS — L97809 Non-pressure chronic ulcer of other part of unspecified lower leg with unspecified severity: Secondary | ICD-10-CM | POA: Diagnosis not present

## 2014-04-02 DIAGNOSIS — L97209 Non-pressure chronic ulcer of unspecified calf with unspecified severity: Secondary | ICD-10-CM | POA: Diagnosis not present

## 2014-04-02 DIAGNOSIS — L97309 Non-pressure chronic ulcer of unspecified ankle with unspecified severity: Secondary | ICD-10-CM | POA: Diagnosis not present

## 2014-04-02 DIAGNOSIS — J449 Chronic obstructive pulmonary disease, unspecified: Secondary | ICD-10-CM | POA: Diagnosis not present

## 2014-04-02 DIAGNOSIS — Z48812 Encounter for surgical aftercare following surgery on the circulatory system: Secondary | ICD-10-CM | POA: Diagnosis not present

## 2014-04-02 DIAGNOSIS — M25569 Pain in unspecified knee: Secondary | ICD-10-CM | POA: Diagnosis not present

## 2014-04-05 DIAGNOSIS — J449 Chronic obstructive pulmonary disease, unspecified: Secondary | ICD-10-CM | POA: Diagnosis not present

## 2014-04-05 DIAGNOSIS — L97809 Non-pressure chronic ulcer of other part of unspecified lower leg with unspecified severity: Secondary | ICD-10-CM | POA: Diagnosis not present

## 2014-04-05 DIAGNOSIS — L97209 Non-pressure chronic ulcer of unspecified calf with unspecified severity: Secondary | ICD-10-CM | POA: Diagnosis not present

## 2014-04-05 DIAGNOSIS — L97309 Non-pressure chronic ulcer of unspecified ankle with unspecified severity: Secondary | ICD-10-CM | POA: Diagnosis not present

## 2014-04-05 DIAGNOSIS — Z48812 Encounter for surgical aftercare following surgery on the circulatory system: Secondary | ICD-10-CM | POA: Diagnosis not present

## 2014-04-05 DIAGNOSIS — M25569 Pain in unspecified knee: Secondary | ICD-10-CM | POA: Diagnosis not present

## 2014-04-09 DIAGNOSIS — M25569 Pain in unspecified knee: Secondary | ICD-10-CM | POA: Diagnosis not present

## 2014-04-09 DIAGNOSIS — L97809 Non-pressure chronic ulcer of other part of unspecified lower leg with unspecified severity: Secondary | ICD-10-CM | POA: Diagnosis not present

## 2014-04-09 DIAGNOSIS — L97309 Non-pressure chronic ulcer of unspecified ankle with unspecified severity: Secondary | ICD-10-CM | POA: Diagnosis not present

## 2014-04-09 DIAGNOSIS — L97209 Non-pressure chronic ulcer of unspecified calf with unspecified severity: Secondary | ICD-10-CM | POA: Diagnosis not present

## 2014-04-09 DIAGNOSIS — Z48812 Encounter for surgical aftercare following surgery on the circulatory system: Secondary | ICD-10-CM | POA: Diagnosis not present

## 2014-04-09 DIAGNOSIS — J449 Chronic obstructive pulmonary disease, unspecified: Secondary | ICD-10-CM | POA: Diagnosis not present

## 2014-04-14 DIAGNOSIS — J449 Chronic obstructive pulmonary disease, unspecified: Secondary | ICD-10-CM | POA: Diagnosis not present

## 2014-04-14 DIAGNOSIS — M25569 Pain in unspecified knee: Secondary | ICD-10-CM | POA: Diagnosis not present

## 2014-04-14 DIAGNOSIS — Z48812 Encounter for surgical aftercare following surgery on the circulatory system: Secondary | ICD-10-CM | POA: Diagnosis not present

## 2014-04-14 DIAGNOSIS — L97309 Non-pressure chronic ulcer of unspecified ankle with unspecified severity: Secondary | ICD-10-CM | POA: Diagnosis not present

## 2014-04-14 DIAGNOSIS — L97809 Non-pressure chronic ulcer of other part of unspecified lower leg with unspecified severity: Secondary | ICD-10-CM | POA: Diagnosis not present

## 2014-04-14 DIAGNOSIS — L97209 Non-pressure chronic ulcer of unspecified calf with unspecified severity: Secondary | ICD-10-CM | POA: Diagnosis not present

## 2014-04-15 ENCOUNTER — Telehealth: Payer: Self-pay

## 2014-04-15 DIAGNOSIS — R5381 Other malaise: Secondary | ICD-10-CM

## 2014-04-15 NOTE — Telephone Encounter (Signed)
Phone call from [name], [name].  Reported pt. is no longer homebound, and is not eligible to receive in-home Physical Therapy.  Stated pt. has lower back problems, and balance issues, and could benefit from continued physical therapy, as an outpatient.  Reports that pt. Uses a quad cane in the home, and a walker in the community for balance.  Is requesting [name] to refer pt. to an outpatient PT program.  Stated pt. requested to be referred to [facility].  Discussed with [name].  Gave approval to make recommendation to [facility] for Physical Therapy for continued strengthening, and balance.  Notified HH PT, [name]; left VM for her re: [name] recommendation.

## 2014-04-16 DIAGNOSIS — Z48812 Encounter for surgical aftercare following surgery on the circulatory system: Secondary | ICD-10-CM | POA: Diagnosis not present

## 2014-04-16 DIAGNOSIS — L97309 Non-pressure chronic ulcer of unspecified ankle with unspecified severity: Secondary | ICD-10-CM | POA: Diagnosis not present

## 2014-04-16 DIAGNOSIS — L97809 Non-pressure chronic ulcer of other part of unspecified lower leg with unspecified severity: Secondary | ICD-10-CM | POA: Diagnosis not present

## 2014-04-16 DIAGNOSIS — L97209 Non-pressure chronic ulcer of unspecified calf with unspecified severity: Secondary | ICD-10-CM | POA: Diagnosis not present

## 2014-04-16 DIAGNOSIS — M25569 Pain in unspecified knee: Secondary | ICD-10-CM | POA: Diagnosis not present

## 2014-04-16 DIAGNOSIS — J449 Chronic obstructive pulmonary disease, unspecified: Secondary | ICD-10-CM | POA: Diagnosis not present

## 2014-04-22 DIAGNOSIS — M545 Low back pain, unspecified: Secondary | ICD-10-CM | POA: Diagnosis not present

## 2014-04-26 DIAGNOSIS — M545 Low back pain, unspecified: Secondary | ICD-10-CM | POA: Diagnosis not present

## 2014-04-28 DIAGNOSIS — M545 Low back pain, unspecified: Secondary | ICD-10-CM | POA: Diagnosis not present

## 2014-04-29 DIAGNOSIS — M545 Low back pain, unspecified: Secondary | ICD-10-CM | POA: Diagnosis not present

## 2014-04-29 DIAGNOSIS — Z79899 Other long term (current) drug therapy: Secondary | ICD-10-CM | POA: Diagnosis not present

## 2014-04-29 DIAGNOSIS — M79609 Pain in unspecified limb: Secondary | ICD-10-CM | POA: Diagnosis not present

## 2014-04-29 DIAGNOSIS — G894 Chronic pain syndrome: Secondary | ICD-10-CM | POA: Diagnosis not present

## 2014-04-30 DIAGNOSIS — L608 Other nail disorders: Secondary | ICD-10-CM | POA: Diagnosis not present

## 2014-05-01 DIAGNOSIS — D649 Anemia, unspecified: Secondary | ICD-10-CM | POA: Diagnosis not present

## 2014-05-10 DIAGNOSIS — M545 Low back pain, unspecified: Secondary | ICD-10-CM | POA: Diagnosis not present

## 2014-05-12 DIAGNOSIS — M545 Low back pain, unspecified: Secondary | ICD-10-CM | POA: Diagnosis not present

## 2014-05-24 DIAGNOSIS — M545 Low back pain, unspecified: Secondary | ICD-10-CM | POA: Diagnosis not present

## 2014-05-26 DIAGNOSIS — M545 Low back pain, unspecified: Secondary | ICD-10-CM | POA: Diagnosis not present

## 2014-05-27 DIAGNOSIS — M545 Low back pain, unspecified: Secondary | ICD-10-CM | POA: Diagnosis not present

## 2014-05-27 DIAGNOSIS — M5430 Sciatica, unspecified side: Secondary | ICD-10-CM | POA: Diagnosis not present

## 2014-05-31 DIAGNOSIS — M545 Low back pain, unspecified: Secondary | ICD-10-CM | POA: Diagnosis not present

## 2014-06-02 DIAGNOSIS — M545 Low back pain, unspecified: Secondary | ICD-10-CM | POA: Diagnosis not present

## 2014-06-09 DIAGNOSIS — M545 Low back pain, unspecified: Secondary | ICD-10-CM | POA: Diagnosis not present

## 2014-06-16 ENCOUNTER — Encounter: Payer: Self-pay | Admitting: Vascular Surgery

## 2014-06-16 DIAGNOSIS — M545 Low back pain, unspecified: Secondary | ICD-10-CM | POA: Diagnosis not present

## 2014-06-17 ENCOUNTER — Ambulatory Visit (INDEPENDENT_AMBULATORY_CARE_PROVIDER_SITE_OTHER): Payer: Medicare Other | Admitting: Vascular Surgery

## 2014-06-17 ENCOUNTER — Ambulatory Visit (HOSPITAL_COMMUNITY)
Admission: RE | Admit: 2014-06-17 | Discharge: 2014-06-17 | Disposition: A | Discharge status: Home / Self Care | Payer: Medicare Other | Source: Ambulatory Visit | Attending: Vascular Surgery | Admitting: Vascular Surgery

## 2014-06-17 ENCOUNTER — Encounter: Payer: Self-pay | Admitting: Vascular Surgery

## 2014-06-17 ENCOUNTER — Ambulatory Visit (INDEPENDENT_AMBULATORY_CARE_PROVIDER_SITE_OTHER)
Admission: RE | Admit: 2014-06-17 | Discharge: 2014-06-17 | Disposition: A | Discharge status: Home / Self Care | Payer: Medicare Other | Source: Ambulatory Visit | Attending: Vascular Surgery | Admitting: Vascular Surgery

## 2014-06-17 ENCOUNTER — Other Ambulatory Visit: Payer: Self-pay | Admitting: Vascular Surgery

## 2014-06-17 VITALS — BP 160/61 | HR 73 | Ht 64.0 in | Wt 110.0 lb

## 2014-06-17 DIAGNOSIS — R0989 Other specified symptoms and signs involving the circulatory and respiratory systems: Secondary | ICD-10-CM

## 2014-06-17 DIAGNOSIS — Z48812 Encounter for surgical aftercare following surgery on the circulatory system: Secondary | ICD-10-CM

## 2014-06-17 DIAGNOSIS — L98499 Non-pressure chronic ulcer of skin of other sites with unspecified severity: Principal | ICD-10-CM

## 2014-06-17 DIAGNOSIS — I739 Peripheral vascular disease, unspecified: Secondary | ICD-10-CM

## 2014-06-17 NOTE — Progress Notes (Signed)
[facility]  Doing well with the wound care since we started santyl.  Recent femoral-femoral bypass with left femoral to above-knee popliteal bypass all with prosthetic January 28.  He still complains of burning numbness tingling in his left foot. He is now being followed for this at the pain clinic. He also complains of seeing a spectrum of light in his right eye intermittently. He states that this is occurring more frequently.  Review of systems: He denies TIA amaurosis or stroke-type symptoms. He has no shortness of breath. He has no chest pain.  Family History  Problem Relation Age of Onset  . Stroke Mother   . Cancer Father   . Dementia Father   . Cancer Sister    Past Medical History  Diagnosis Date  . Hypertension   . Dyslipidemia   . Cerebrovascular disease     right brain CVA 2007  . Lumbago   . Abnormality of gait 03/03/2013  . CVA (cerebral vascular accident) 2007    r-cva  . COPD (chronic obstructive pulmonary disease)   . Cough productive of clear sputum     TX RECENT SINUS INFECTION   . bladder ca dx'd 11/2009    chemo/xrt comp 02/2010    Past Surgical History  Procedure Laterality Date  . Carotid artery angioplasty  2009  . Back surgery  2010  . Bladder cancer    . Laminectomy  09/08/2013    L 2 L3 L4 L5       Dr Luiz Ochoa  . Femoral-femoral bypass graft N/A 12/09/2013    Procedure: BYPASS GRAFT FEMORAL-FEMORAL ARTERY WITH BILATERAL ENDARTERECTOMY ;  Surgeon: Elam Dutch, MD;  Location: [facility] OR;  Service: Vascular;  Laterality: N/A;  . Femoral-popliteal bypass graft Left 12/09/2013    Procedure: BYPASS GRAFT FEMORAL-POPLITEAL ARTERY - LEFT ;  Surgeon: Elam Dutch, MD;  Location: [facility] OR;  Service: Vascular;  Laterality: Left;    Objective  Filed Vitals:   06/17/14 1519  BP: 160/61  Pulse: 73  Height: 5\' 4"  (1.626 m)  Weight: 110 lb (49.896 kg)  SpO2: 96%   Left femoral graft palpable pulse, dp palpable pulse. Cap  refill brisk. Skin Left pretibial area  1 x 3 cm debrided back to healthy pink skin today lateral calf similar area of dry eschar debrided All other wounds healed Surgical incisions are all well healed.  Neck: Bilateral carotid bruits Chest: Clear to auscultation bilaterally Neuro: No facial asymmetry upper and lower extremities 5 over 5 motor with the exception of left foot which has decreased flexion and extension ability  Data: Due to the patient's eyes symptoms and previous carotid endarterectomy and bilateral bruits he had a carotid duplex scan today. She patent right carotid endarterectomy with no recurrent stenosis. Left side was less than 40%. There were slightly increased velocities in the right subclavian artery but no blood pressure discrepancy between the right and left arm. Patient also had a graft duplex exam today which are reviewed and interpreted. Right-to-left femoral-femoral bypass was patent. ABI on the right 0.7 left 0.8. Slightly increased velocities in the common femoral artery above the anastomosis at 282 cm/s.  Assessment/Planning: Prior right carotid endarterectomy elsewhere. No significant recurrent stenosis. Needs repeat carotid duplex in 1 year.  Right left femoral femoral bypass with bilateral common femoral endarterectomies and a left femoral to above-knee popliteal bypass all with prosthetic on December 09 2013 all patent Most of his pain issues at this point seemed  to be neuropathic in origin.   Continue washing with soap and water daily.  Apply santyl to pretibial wound daily.  Still has pain in the left foot most likely neuropathic grafts are patent. Left foot is well-perfused. 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 He is going to  discuss with his primary care physician better blood pressure control. If symptoms continue to persist would consider neurologic consultation.  Deconditioning: Would probably benefit from extending physical therapy another 4-6 weeks  Ruta Hinds, MD [facility] [place] Office: [phone] Pager: [phone]

## 2014-06-18 ENCOUNTER — Encounter: Payer: Self-pay | Admitting: *Deleted

## 2014-06-18 NOTE — Progress Notes (Signed)
Received a staff message from [name] to extend this patient's physical therapy at [facility] orthopedics for an additional 4-6-weeks. He sees them for deconditioning issues. Faxed an order to [employer] [phone].  Patient was called.

## 2014-06-21 DIAGNOSIS — M545 Low back pain, unspecified: Secondary | ICD-10-CM | POA: Diagnosis not present

## 2014-06-23 DIAGNOSIS — M5430 Sciatica, unspecified side: Secondary | ICD-10-CM | POA: Diagnosis not present

## 2014-06-23 DIAGNOSIS — G894 Chronic pain syndrome: Secondary | ICD-10-CM | POA: Diagnosis not present

## 2014-06-23 DIAGNOSIS — M533 Sacrococcygeal disorders, not elsewhere classified: Secondary | ICD-10-CM | POA: Diagnosis not present

## 2014-06-23 DIAGNOSIS — M545 Low back pain, unspecified: Secondary | ICD-10-CM | POA: Diagnosis not present

## 2014-06-23 DIAGNOSIS — Z79899 Other long term (current) drug therapy: Secondary | ICD-10-CM | POA: Diagnosis not present

## 2014-06-24 DIAGNOSIS — E559 Vitamin D deficiency, unspecified: Secondary | ICD-10-CM | POA: Diagnosis not present

## 2014-06-24 DIAGNOSIS — I739 Peripheral vascular disease, unspecified: Secondary | ICD-10-CM | POA: Diagnosis not present

## 2014-06-24 DIAGNOSIS — D509 Iron deficiency anemia, unspecified: Secondary | ICD-10-CM | POA: Diagnosis not present

## 2014-06-24 DIAGNOSIS — E785 Hyperlipidemia, unspecified: Secondary | ICD-10-CM | POA: Diagnosis not present

## 2014-06-24 DIAGNOSIS — I10 Essential (primary) hypertension: Secondary | ICD-10-CM | POA: Diagnosis not present

## 2014-06-28 DIAGNOSIS — M545 Low back pain, unspecified: Secondary | ICD-10-CM | POA: Diagnosis not present

## 2014-06-30 DIAGNOSIS — M545 Low back pain, unspecified: Secondary | ICD-10-CM | POA: Diagnosis not present

## 2014-07-05 DIAGNOSIS — M545 Low back pain, unspecified: Secondary | ICD-10-CM | POA: Diagnosis not present

## 2014-07-07 DIAGNOSIS — M545 Low back pain, unspecified: Secondary | ICD-10-CM | POA: Diagnosis not present

## 2014-07-12 DIAGNOSIS — M545 Low back pain, unspecified: Secondary | ICD-10-CM | POA: Diagnosis not present

## 2014-07-21 DIAGNOSIS — M545 Low back pain, unspecified: Secondary | ICD-10-CM | POA: Diagnosis not present

## 2014-07-21 DIAGNOSIS — M5430 Sciatica, unspecified side: Secondary | ICD-10-CM | POA: Diagnosis not present

## 2014-07-21 DIAGNOSIS — G894 Chronic pain syndrome: Secondary | ICD-10-CM | POA: Diagnosis not present

## 2014-07-21 DIAGNOSIS — M533 Sacrococcygeal disorders, not elsewhere classified: Secondary | ICD-10-CM | POA: Diagnosis not present

## 2014-07-21 DIAGNOSIS — Z79899 Other long term (current) drug therapy: Secondary | ICD-10-CM | POA: Diagnosis not present

## 2014-08-10 DIAGNOSIS — Z23 Encounter for immunization: Secondary | ICD-10-CM | POA: Diagnosis not present

## 2014-08-17 ENCOUNTER — Telehealth: Payer: Self-pay

## 2014-08-17 NOTE — Telephone Encounter (Signed)
sched lab appts 08/26/14 @ 3:30  and 4:00  sched md appt 09/02/14 @ 10:45  Lm on pt's hm# to inform them of appt

## 2014-08-17 NOTE — Telephone Encounter (Signed)
Pt's wife call on his behalf.  Reported there are 2 new areas of left lateral leg, in calf region, that are oozing a yellow drainage; reports there is some increased redness surrounding the areas.  Denies any fever/ chills.  Reported that there is continued numbness of left foot up to left knee, that has been present since surgery.  Reported swelling of left foot up to calf.  Denies any swelling of right lower extremity.   Requesting earlier appt. Than the 3 mo. F/u in November.  Pt's wife also questioned about care of the new area that are draining.  Encouraged to cleanse sites daily with Dial soap and rinse well, and change bandage prn to maintain clean/dry area.  Advised pt's wife that office receptionist will call her back with new appt.  Verb. Understanding.

## 2014-08-19 DIAGNOSIS — M545 Low back pain: Secondary | ICD-10-CM | POA: Diagnosis not present

## 2014-08-19 DIAGNOSIS — Z79891 Long term (current) use of opiate analgesic: Secondary | ICD-10-CM | POA: Diagnosis not present

## 2014-08-19 DIAGNOSIS — G541 Lumbosacral plexus disorders: Secondary | ICD-10-CM | POA: Diagnosis not present

## 2014-08-19 DIAGNOSIS — G603 Idiopathic progressive neuropathy: Secondary | ICD-10-CM | POA: Diagnosis not present

## 2014-08-19 DIAGNOSIS — M25572 Pain in left ankle and joints of left foot: Secondary | ICD-10-CM | POA: Diagnosis not present

## 2014-08-26 ENCOUNTER — Ambulatory Visit (HOSPITAL_COMMUNITY)
Admission: RE | Admit: 2014-08-26 | Discharge: 2014-08-26 | Disposition: A | Discharge status: Home / Self Care | Payer: Medicare Other | Source: Ambulatory Visit | Attending: Vascular Surgery | Admitting: Vascular Surgery

## 2014-08-26 ENCOUNTER — Ambulatory Visit (INDEPENDENT_AMBULATORY_CARE_PROVIDER_SITE_OTHER)
Admission: RE | Admit: 2014-08-26 | Discharge: 2014-08-26 | Disposition: A | Discharge status: Home / Self Care | Payer: Medicare Other | Source: Ambulatory Visit | Attending: Vascular Surgery | Admitting: Vascular Surgery

## 2014-08-26 DIAGNOSIS — L98499 Non-pressure chronic ulcer of skin of other sites with unspecified severity: Secondary | ICD-10-CM | POA: Diagnosis not present

## 2014-08-26 DIAGNOSIS — Z48812 Encounter for surgical aftercare following surgery on the circulatory system: Secondary | ICD-10-CM

## 2014-08-26 DIAGNOSIS — E785 Hyperlipidemia, unspecified: Secondary | ICD-10-CM | POA: Insufficient documentation

## 2014-08-26 DIAGNOSIS — I739 Peripheral vascular disease, unspecified: Secondary | ICD-10-CM

## 2014-08-26 DIAGNOSIS — I10 Essential (primary) hypertension: Secondary | ICD-10-CM | POA: Diagnosis not present

## 2014-08-26 DIAGNOSIS — F17210 Nicotine dependence, cigarettes, uncomplicated: Secondary | ICD-10-CM | POA: Insufficient documentation

## 2014-08-31 ENCOUNTER — Encounter: Payer: Self-pay | Admitting: Vascular Surgery

## 2014-09-01 ENCOUNTER — Ambulatory Visit (INDEPENDENT_AMBULATORY_CARE_PROVIDER_SITE_OTHER): Payer: Medicare Other | Admitting: Vascular Surgery

## 2014-09-01 ENCOUNTER — Encounter: Payer: Self-pay | Admitting: Vascular Surgery

## 2014-09-01 VITALS — BP 135/51 | HR 77 | Ht 64.0 in | Wt 113.0 lb

## 2014-09-01 DIAGNOSIS — Z48812 Encounter for surgical aftercare following surgery on the circulatory system: Secondary | ICD-10-CM

## 2014-09-01 DIAGNOSIS — Z9889 Other specified postprocedural states: Secondary | ICD-10-CM

## 2014-09-01 DIAGNOSIS — Z95828 Presence of other vascular implants and grafts: Secondary | ICD-10-CM

## 2014-09-01 DIAGNOSIS — I739 Peripheral vascular disease, unspecified: Secondary | ICD-10-CM | POA: Diagnosis not present

## 2014-09-01 DIAGNOSIS — L98499 Non-pressure chronic ulcer of skin of other sites with unspecified severity: Secondary | ICD-10-CM

## 2014-09-01 DIAGNOSIS — I70209 Unspecified atherosclerosis of native arteries of extremities, unspecified extremity: Secondary | ICD-10-CM | POA: Insufficient documentation

## 2014-09-01 NOTE — Addendum Note (Signed)
Addended by: Mena Goes on: 09/01/2014 02:27 PM   Modules accepted: Orders

## 2014-09-01 NOTE — Progress Notes (Signed)
[facility] [place]   Recent femoral-femoral bypass with left femoral to above-knee popliteal bypass all with prosthetic January 28.  He still complains of burning numbness tingling in his left foot. He is now being followed for this at the pain clinic. Overall he is doing well. He denies claudication. All wounds in the left leg are now healed.  Review of systems: He denies TIA amaurosis or stroke-type symptoms. He has no shortness of breath. He has no chest pain.    Family History   Problem  Relation  Age of Onset   .  Stroke  Mother     .  Cancer  Father     .  Dementia  Father     .  Cancer  Sister        Past Medical History   Diagnosis  Date   .  Hypertension     .  Dyslipidemia     .  Cerebrovascular disease         right brain CVA 2007   .  Lumbago     .  Abnormality of gait  03/03/2013   .  CVA (cerebral vascular accident)  2007       r-cva   .  COPD (chronic obstructive pulmonary disease)     .  Cough productive of clear sputum         TX RECENT SINUS INFECTION    .  bladder ca  dx'd 11/2009       chemo/xrt comp 02/2010       Past Surgical History   Procedure  Laterality  Date   .  Carotid artery angioplasty    2009   .  Back surgery    2010   .  Bladder cancer       .  Laminectomy    09/08/2013       L 2 L3 L4 L5       Dr Luiz Ochoa   .  Femoral-femoral bypass graft  N/A  12/09/2013       Procedure: BYPASS GRAFT FEMORAL-FEMORAL ARTERY WITH BILATERAL ENDARTERECTOMY ;  Surgeon: Elam Dutch, MD;  Location: [facility] OR;  Service: Vascular;  Laterality: N/A;   .  Femoral-popliteal bypass graft  Left  12/09/2013       Procedure: BYPASS GRAFT FEMORAL-POPLITEAL ARTERY - LEFT ;  Surgeon: Elam Dutch, MD;  Location: [facility] OR;  Service: Vascular;  Laterality: Left;     Objective    Filed Vitals:   09/01/14 0900  BP: 135/51  Pulse: 77  Height: 5\' 4"  (1.626 m)  Weight: 113 lb (51.256 kg)  SpO2: 97%    Left femoral graft palpable pulse, dp palpable  pulse. Cap refill brisk. Skin Left pretibial area  healed  All other wounds healed Surgical incisions are all well healed.  Neck: Bilateral carotid bruits Chest: Clear to auscultation bilaterally Neuro: No facial asymmetry upper and lower extremities 5 over 5 motor with the exception of left foot which has decreased flexion and extension ability  Data: Due to the patient's eyes symptoms and previous carotid endarterectomy and bilateral bruits he had a carotid duplex scan at his prior visit with patent right carotid endarterectomy with no recurrent stenosis. Left side was less than 40%. There were slightly increased velocities in the right subclavian artery but no blood pressure discrepancy between the right and left arm. Patient also had a graft duplex exam today which are reviewed and interpreted. Right-to-left femoral-femoral  bypass was patent. ABI on the right 0.8 left 0.9. Slightly increased velocities in the common femoral artery above the anastomosis at 368 cm/s.  Assessment/Planning: Prior right carotid endarterectomy elsewhere. No significant recurrent stenosis. Needs repeat carotid duplex in 6 months.  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 He may have some narrowing of the native right external iliac artery if his velocities continued to increase or if he develops symptoms when may need to review this further.  However currently has an easily palpable graft pulse suggesting that this is not a flow-limiting stenosis     Still has pain in the left foot most likely neuropathic grafts are patent. Left foot is well-perfused. Wounds on left pretibial region healed  Followup 3 month for graft scan  He currently is not smoking.  Ruta Hinds, MD [facility] [place] Office: [phone] Pager: [phone]

## 2014-09-02 ENCOUNTER — Ambulatory Visit: Payer: Medicare Other | Admitting: Vascular Surgery

## 2014-09-06 DIAGNOSIS — E782 Mixed hyperlipidemia: Secondary | ICD-10-CM | POA: Diagnosis not present

## 2014-09-06 DIAGNOSIS — D509 Iron deficiency anemia, unspecified: Secondary | ICD-10-CM | POA: Diagnosis not present

## 2014-09-06 DIAGNOSIS — I10 Essential (primary) hypertension: Secondary | ICD-10-CM | POA: Diagnosis not present

## 2014-09-06 DIAGNOSIS — E559 Vitamin D deficiency, unspecified: Secondary | ICD-10-CM | POA: Diagnosis not present

## 2014-09-16 ENCOUNTER — Ambulatory Visit: Payer: Medicare Other | Admitting: Vascular Surgery

## 2014-09-16 ENCOUNTER — Encounter (HOSPITAL_COMMUNITY): Payer: Medicare Other

## 2014-09-16 ENCOUNTER — Other Ambulatory Visit (HOSPITAL_COMMUNITY): Payer: Medicare Other

## 2014-09-29 DIAGNOSIS — M25562 Pain in left knee: Secondary | ICD-10-CM | POA: Diagnosis not present

## 2014-09-29 DIAGNOSIS — G8929 Other chronic pain: Secondary | ICD-10-CM | POA: Diagnosis not present

## 2014-09-29 DIAGNOSIS — M79605 Pain in left leg: Secondary | ICD-10-CM | POA: Diagnosis not present

## 2014-09-29 DIAGNOSIS — R201 Hypoesthesia of skin: Secondary | ICD-10-CM | POA: Diagnosis not present

## 2014-10-05 ENCOUNTER — Encounter: Payer: Self-pay | Admitting: *Deleted

## 2014-10-18 ENCOUNTER — Telehealth: Payer: Self-pay

## 2014-10-18 NOTE — Telephone Encounter (Signed)
Wife called to report scab has come off of the shin area of the left lower leg.  Reported the open sore has "a little depth to it", and is approx. nickel-sized.  Reported a thin, yellow drainage.  Denies redness in peri-wound; denies fever/chills. Advised to have him cleanse daily with dial soap, and redress with clean dressing.  Advised to moisten the dressing prior to removing it, to prevent further injury.  Appt. given for 10/21/14 @ 9:00 AM.  Verb. Understanding. Agrees.

## 2014-10-20 ENCOUNTER — Encounter: Payer: Self-pay | Admitting: Family

## 2014-10-21 ENCOUNTER — Ambulatory Visit (INDEPENDENT_AMBULATORY_CARE_PROVIDER_SITE_OTHER): Payer: Medicare Other | Admitting: Family

## 2014-10-21 ENCOUNTER — Encounter: Payer: Self-pay | Admitting: Family

## 2014-10-21 VITALS — BP 138/70 | HR 66 | Temp 98.6°F | Resp 16 | Ht 64.0 in | Wt 111.0 lb

## 2014-10-21 DIAGNOSIS — Z48812 Encounter for surgical aftercare following surgery on the circulatory system: Secondary | ICD-10-CM

## 2014-10-21 DIAGNOSIS — T148 Other injury of unspecified body region: Secondary | ICD-10-CM

## 2014-10-21 DIAGNOSIS — L98499 Non-pressure chronic ulcer of skin of other sites with unspecified severity: Secondary | ICD-10-CM | POA: Diagnosis not present

## 2014-10-21 DIAGNOSIS — I739 Peripheral vascular disease, unspecified: Secondary | ICD-10-CM | POA: Diagnosis not present

## 2014-10-21 DIAGNOSIS — L24A9 Irritant contact dermatitis due friction or contact with other specified body fluids: Secondary | ICD-10-CM | POA: Insufficient documentation

## 2014-10-21 DIAGNOSIS — I70209 Unspecified atherosclerosis of native arteries of extremities, unspecified extremity: Secondary | ICD-10-CM

## 2014-10-21 DIAGNOSIS — T148XXA Other injury of unspecified body region, initial encounter: Secondary | ICD-10-CM | POA: Insufficient documentation

## 2014-10-21 NOTE — Patient Instructions (Signed)

## 2014-10-21 NOTE — Progress Notes (Signed)
[facility] [place] HISTORY AND PHYSICAL -PAD  History of Present Illness Thomas Ferrell is a 73 y.o. male patient of 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 He still complains of burning numbness tingling in his left foot. He is now being followed for this at the pain clinic. He denies claudication. He returns today with report of drainage from his left lower leg, anterior aspect, that started after he showered 4 days ago, stopped draining the next day and scabbed over. He wants to know if he should keep this covered to keep it from getting irritated by clothing. He denies fever or chills. He also reports concern that his left great toe has toe drop and a rubbed area at medial aspect of toe is forming. 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 He reports that his left ankle is not swollen in the morning after all night elevation of legs, but is swollen as the day progresses.  He walks with a cane, states no difficulties with falling, denies non healing wounds elsewhere.  Pt Diabetic: No Pt smoker: former smoker, quit in 2007  Pt meds include: Statin :Yes Betablocker: No ASA: No Other anticoagulants/antiplatelets: no  Past Medical History  Diagnosis Date  . Hypertension   . Dyslipidemia   . Cerebrovascular disease     right brain CVA 2007  . Lumbago   . Abnormality of gait 03/03/2013  . CVA (cerebral vascular accident) 2007    r-cva  . COPD (chronic obstructive pulmonary disease)   . Cough productive of clear sputum     TX RECENT SINUS INFECTION   . bladder ca dx'd 11/2009    chemo/xrt comp 02/2010    Social History History  Substance Use Topics  . Smoking status: Former Smoker    Types: Cigarettes    Quit date: 11/12/2005  . Smokeless tobacco: Never Used  . Alcohol Use: 12.6 oz/week    21 Cans of beer per week    Family History Family  History  Problem Relation Age of Onset  . Stroke Mother   . Cancer Father   . Dementia Father   . Cancer Sister     Past Surgical History  Procedure Laterality Date  . Carotid artery angioplasty  2009  . Back surgery  2010  . Bladder cancer    . Laminectomy  09/08/2013    L 2 L3 L4 L5       Dr Luiz Ochoa  . Femoral-femoral bypass graft N/A 12/09/2013    Procedure: BYPASS GRAFT FEMORAL-FEMORAL ARTERY WITH BILATERAL ENDARTERECTOMY ;  Surgeon: Elam Dutch, MD;  Location: [facility] OR;  Service: Vascular;  Laterality: N/A;  . Femoral-popliteal bypass graft Left 12/09/2013    Procedure: BYPASS GRAFT FEMORAL-POPLITEAL ARTERY - LEFT ;  Surgeon: Elam Dutch, MD;  Location: [place];  Service: Vascular;  Laterality: Left;    Allergies  Allergen Reactions  . Morphine And Related Itching    Lasts about 1 week beyond use.  Itching to entire body.    Current Outpatient Prescriptions  Medication Sig Dispense Refill  . acetaminophen (TYLENOL) 500 MG tablet Take 1,000 mg by mouth every 6 (six) hours as needed for pain.    Marland Kitchen amLODipine (NORVASC) 5 MG tablet Take 5 mg by mouth daily.    . collagenase (SANTYL) ointment Apply 1 application topically daily. 30 g 1  . docusate  sodium (COLACE) 100 MG capsule Take 1 capsule (100 mg total) by mouth every 12 (twelve) hours. 30 capsule 0  . feeding supplement, ENSURE COMPLETE, (ENSURE COMPLETE) LIQD Take 237 mLs by mouth 3 (three) times daily with meals.    . gabapentin (NEURONTIN) 300 MG capsule Take 2 capsules by mouth 3 (three) times daily.    Marland Kitchen levETIRAcetam (KEPPRA) 250 MG tablet Take 250 mg by mouth 2 (two) times daily.    Marland Kitchen lisinopril (PRINIVIL,ZESTRIL) 20 MG tablet Take 20 mg by mouth daily.    Marland Kitchen oxyCODONE (OXY IR/ROXICODONE) 5 MG immediate release tablet Take 10 mg by mouth every 8 (eight) hours as needed for severe pain.    Marland Kitchen oxyCODONE-acetaminophen (PERCOCET/ROXICET) 5-325 MG per tablet May take 1 tablet every 4-6 hrs/ prn/ pain 20 tablet 0  .  simvastatin (ZOCOR) 20 MG tablet Take 20 mg by mouth daily.     No current facility-administered medications for this visit.    ROS: See HPI for pertinent positives and negatives.   Physical Examination  Filed Vitals:   10/21/14 0914  BP: 138/70  Pulse: 66  Temp: 98.6 F (37 C)  TempSrc: Oral  Resp: 16  Height: 5\' 4"  (1.626 m)  Weight: 111 lb (50.349 kg)  SpO2: 98%   Body mass index is 19.04 kg/(m^2).  General: A&O x 3, WDWN. Gait: using cane, limp Eyes: PERRLA. Pulmonary:non labored Cardiac: regular Rythm        Aorta is not palpable. Radial pulses: are 2+ palpable and =                           VASCULAR EXAM: Extremities without ischemic changes  without Gangrene; with open granulating wound over left anterior tibial area, dry, no drainage. 2+ pretibial pitting edema left lower leg.                                                                                                          LE Pulses Right Left       FEMORAL  2+ palpable  2+ palpable        POPLITEAL  not palpable   not palpable       POSTERIOR TIBIAL  not palpable   not palpable        DORSALIS PEDIS      ANTERIOR TIBIAL 2+ palpable  2+ palpable    Abdomen: soft, NT, no palpable masses, graft above pubis is palpable. Skin: no rashes, no ulcers, see extremities. Musculoskeletal: no muscle wasting or atrophy.  Neurologic: A&O X 3; Appropriate Affect ; SENSATION: normal; MOTOR FUNCTION:  moving all extremities equally, motor strength 5/5 throughout. Speech is fluent/normal. CN 2-12 intact.    ASSESSMENT: Thomas Ferrell is a 73 y.o. 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 He still complains of burning numbness tingling in his left foot. He is now being followed for this at the pain clinic. He denies claudication. He returns today with report of drainage from his left  lower leg, anterior aspect, that started after he showered 4 days ago, stopped draining the next day and  scabbed over. He wants to know if he should keep this covered to keep it from getting irritated by clothing. He denies fever or chills. He also reports concern that his left great toe has toe drop and a rubbed area at medial aspect of toe is forming. 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  Script to [employer] for left shoe insert. Dry dressing to protect left shin granulating area.   PLAN:  I discussed in depth with the patient the nature of atherosclerosis, and emphasized the importance of maximal medical management including strict control of blood pressure, blood glucose, and lipid levels, obtaining regular exercise, and continued cessation of smoking.  The patient is aware that without maximal medical management the underlying atherosclerotic disease process will progress, limiting the benefit of any interventions.  Based on the patient's HPI and examination, and after discussing with [name], pt will return to clinic in January 2016 with [name] as scheduled.   The patient was given information about PAD including signs, symptoms, treatment, what symptoms should prompt the patient to seek immediate medical care, and risk reduction measures to take.  Clemon Chambers, RN, MSN, FNP-C [facility] [place] [facility] Phone: [phone]  Clinic MD: Oneida Alar  10/21/2014 9:22 AM

## 2014-10-27 DIAGNOSIS — M25562 Pain in left knee: Secondary | ICD-10-CM | POA: Diagnosis not present

## 2014-10-27 DIAGNOSIS — G8929 Other chronic pain: Secondary | ICD-10-CM | POA: Diagnosis not present

## 2014-10-27 DIAGNOSIS — Z79891 Long term (current) use of opiate analgesic: Secondary | ICD-10-CM | POA: Diagnosis not present

## 2014-10-27 DIAGNOSIS — R201 Hypoesthesia of skin: Secondary | ICD-10-CM | POA: Diagnosis not present

## 2014-10-27 DIAGNOSIS — M79605 Pain in left leg: Secondary | ICD-10-CM | POA: Diagnosis not present

## 2014-10-27 DIAGNOSIS — G894 Chronic pain syndrome: Secondary | ICD-10-CM | POA: Diagnosis not present

## 2014-11-04 DIAGNOSIS — D649 Anemia, unspecified: Secondary | ICD-10-CM | POA: Diagnosis not present

## 2014-11-24 DIAGNOSIS — G8929 Other chronic pain: Secondary | ICD-10-CM | POA: Diagnosis not present

## 2014-11-24 DIAGNOSIS — M25572 Pain in left ankle and joints of left foot: Secondary | ICD-10-CM | POA: Diagnosis not present

## 2014-11-24 DIAGNOSIS — R202 Paresthesia of skin: Secondary | ICD-10-CM | POA: Diagnosis not present

## 2014-12-08 ENCOUNTER — Encounter: Payer: Self-pay | Admitting: Family

## 2014-12-09 ENCOUNTER — Encounter: Payer: Medicare Other | Admitting: Family

## 2014-12-09 ENCOUNTER — Ambulatory Visit (HOSPITAL_COMMUNITY)
Admission: RE | Admit: 2014-12-09 | Discharge: 2014-12-09 | Disposition: A | Discharge status: Home / Self Care | Payer: Medicare Other | Source: Ambulatory Visit | Attending: Family | Admitting: Family

## 2014-12-09 ENCOUNTER — Ambulatory Visit (INDEPENDENT_AMBULATORY_CARE_PROVIDER_SITE_OTHER)
Admission: RE | Admit: 2014-12-09 | Discharge: 2014-12-09 | Disposition: A | Discharge status: Home / Self Care | Payer: Medicare Other | Source: Ambulatory Visit | Attending: Family | Admitting: Family

## 2014-12-09 DIAGNOSIS — Z87891 Personal history of nicotine dependence: Secondary | ICD-10-CM | POA: Insufficient documentation

## 2014-12-09 DIAGNOSIS — Z95828 Presence of other vascular implants and grafts: Secondary | ICD-10-CM

## 2014-12-09 DIAGNOSIS — L98499 Non-pressure chronic ulcer of skin of other sites with unspecified severity: Secondary | ICD-10-CM

## 2014-12-09 DIAGNOSIS — E785 Hyperlipidemia, unspecified: Secondary | ICD-10-CM | POA: Insufficient documentation

## 2014-12-09 DIAGNOSIS — I70209 Unspecified atherosclerosis of native arteries of extremities, unspecified extremity: Secondary | ICD-10-CM

## 2014-12-09 DIAGNOSIS — Z9889 Other specified postprocedural states: Secondary | ICD-10-CM | POA: Diagnosis not present

## 2014-12-09 DIAGNOSIS — I739 Peripheral vascular disease, unspecified: Secondary | ICD-10-CM | POA: Diagnosis not present

## 2014-12-09 DIAGNOSIS — I10 Essential (primary) hypertension: Secondary | ICD-10-CM | POA: Diagnosis not present

## 2014-12-09 DIAGNOSIS — Z48812 Encounter for surgical aftercare following surgery on the circulatory system: Secondary | ICD-10-CM

## 2014-12-10 ENCOUNTER — Other Ambulatory Visit: Payer: Self-pay

## 2014-12-10 DIAGNOSIS — I70219 Atherosclerosis of native arteries of extremities with intermittent claudication, unspecified extremity: Secondary | ICD-10-CM

## 2014-12-10 DIAGNOSIS — Z48812 Encounter for surgical aftercare following surgery on the circulatory system: Secondary | ICD-10-CM

## 2014-12-10 NOTE — Patient Instructions (Signed)
  Dear Mr. Sar,  Your recent Vascular Lab study  visit on Jan. 28, 2016 indicates: Improved circulation in LEFT  Leg, Stable in RIGHT leg. Please follow up with [place] in 6 months.         Peripheral Vascular Disease  Peripheral vascular disease (PVD) is caused by cholesterol buildup in the arteries. The arteries become narrow or clogged. This makes it hard for blood to flow. It happens most in the legs, but it can occur in other areas of your body. HOME CARE   Quit smoking, if you smoke.  Exercise as told by your doctor.  Follow a low-fat, low-cholesterol diet as told by your doctor.  Control your diabetes, if you have diabetes.  Care for your feet to prevent infection.  Only take medicine as told by your doctor. GET HELP RIGHT AWAY IF:   You have pain or lose feeling (numbness) in your arms or legs.  Your arms or legs turn cold or blue.  You have redness, warmth, and puffiness (swelling) in your arms or legs. MAKE SURE YOU:   Understand these instructions.  Will watch your condition.  Will get help right away if you are not doing well or get worse. Document Released: 01/23/2010 Document Revised: 01/21/2012 Document Reviewed: 01/23/2010 [facility] Patient Information 2015 [place], [place]. This information is not intended to replace advice given to you by your health care provider. Make sure you discuss any questions you have with your health care provider.

## 2014-12-13 NOTE — Progress Notes (Signed)
Canceled.

## 2014-12-14 ENCOUNTER — Telehealth: Payer: Self-pay | Admitting: Vascular Surgery

## 2014-12-14 NOTE — Telephone Encounter (Signed)
I spoke with Thomas Ferrell to inform him of the change in appts, dpm

## 2014-12-14 NOTE — Telephone Encounter (Signed)
-----   Message from Viann Fish, NP sent at 12/13/2014  8:14 PM EST ----- Regarding: FW: combine appointments?   ----- Message -----    From: Elam Dutch, MD    Sent: 12/13/2014   7:01 PM      To: Sharmon Leyden Nickel, NP Subject: RE: combine appointments?                      Sounds good   Thanks  Charles ----- Message -----    From: Viann Fish, NP    Sent: 12/13/2014   5:12 PM      To: Elam Dutch, MD, Viann Fish, NP, # Subject: combine appointments?                          [name], [facility] Bouquet asked me about whether pt needs to come in as scheduled for April carotid Duplex and August LE Duplex and ABI's.  Looking at both results and the timeline for recent LE bypass, it seems to me the carotid Duplex in April may be canceled and added to the August 2016 LE Duplex and ABI's, and see me.  Is this OK with you?  Thank you,  Vinnie Level

## 2014-12-17 ENCOUNTER — Encounter: Payer: Self-pay | Admitting: Vascular Surgery

## 2014-12-29 DIAGNOSIS — R201 Hypoesthesia of skin: Secondary | ICD-10-CM | POA: Diagnosis not present

## 2014-12-29 DIAGNOSIS — M545 Low back pain: Secondary | ICD-10-CM | POA: Diagnosis not present

## 2014-12-29 DIAGNOSIS — G8929 Other chronic pain: Secondary | ICD-10-CM | POA: Diagnosis not present

## 2014-12-29 DIAGNOSIS — G890 Central pain syndrome: Secondary | ICD-10-CM | POA: Diagnosis not present

## 2014-12-29 DIAGNOSIS — M546 Pain in thoracic spine: Secondary | ICD-10-CM | POA: Diagnosis not present

## 2015-01-11 IMAGING — CR DG CHEST 2V
2 series · 2 of 2 positions shown · non-contrast
Comparison: 10/17/2009

CLINICAL DATA: Preoperative evaluation for lumbar surgery

EXAM:
CHEST  2 VIEW

[w chest pa]
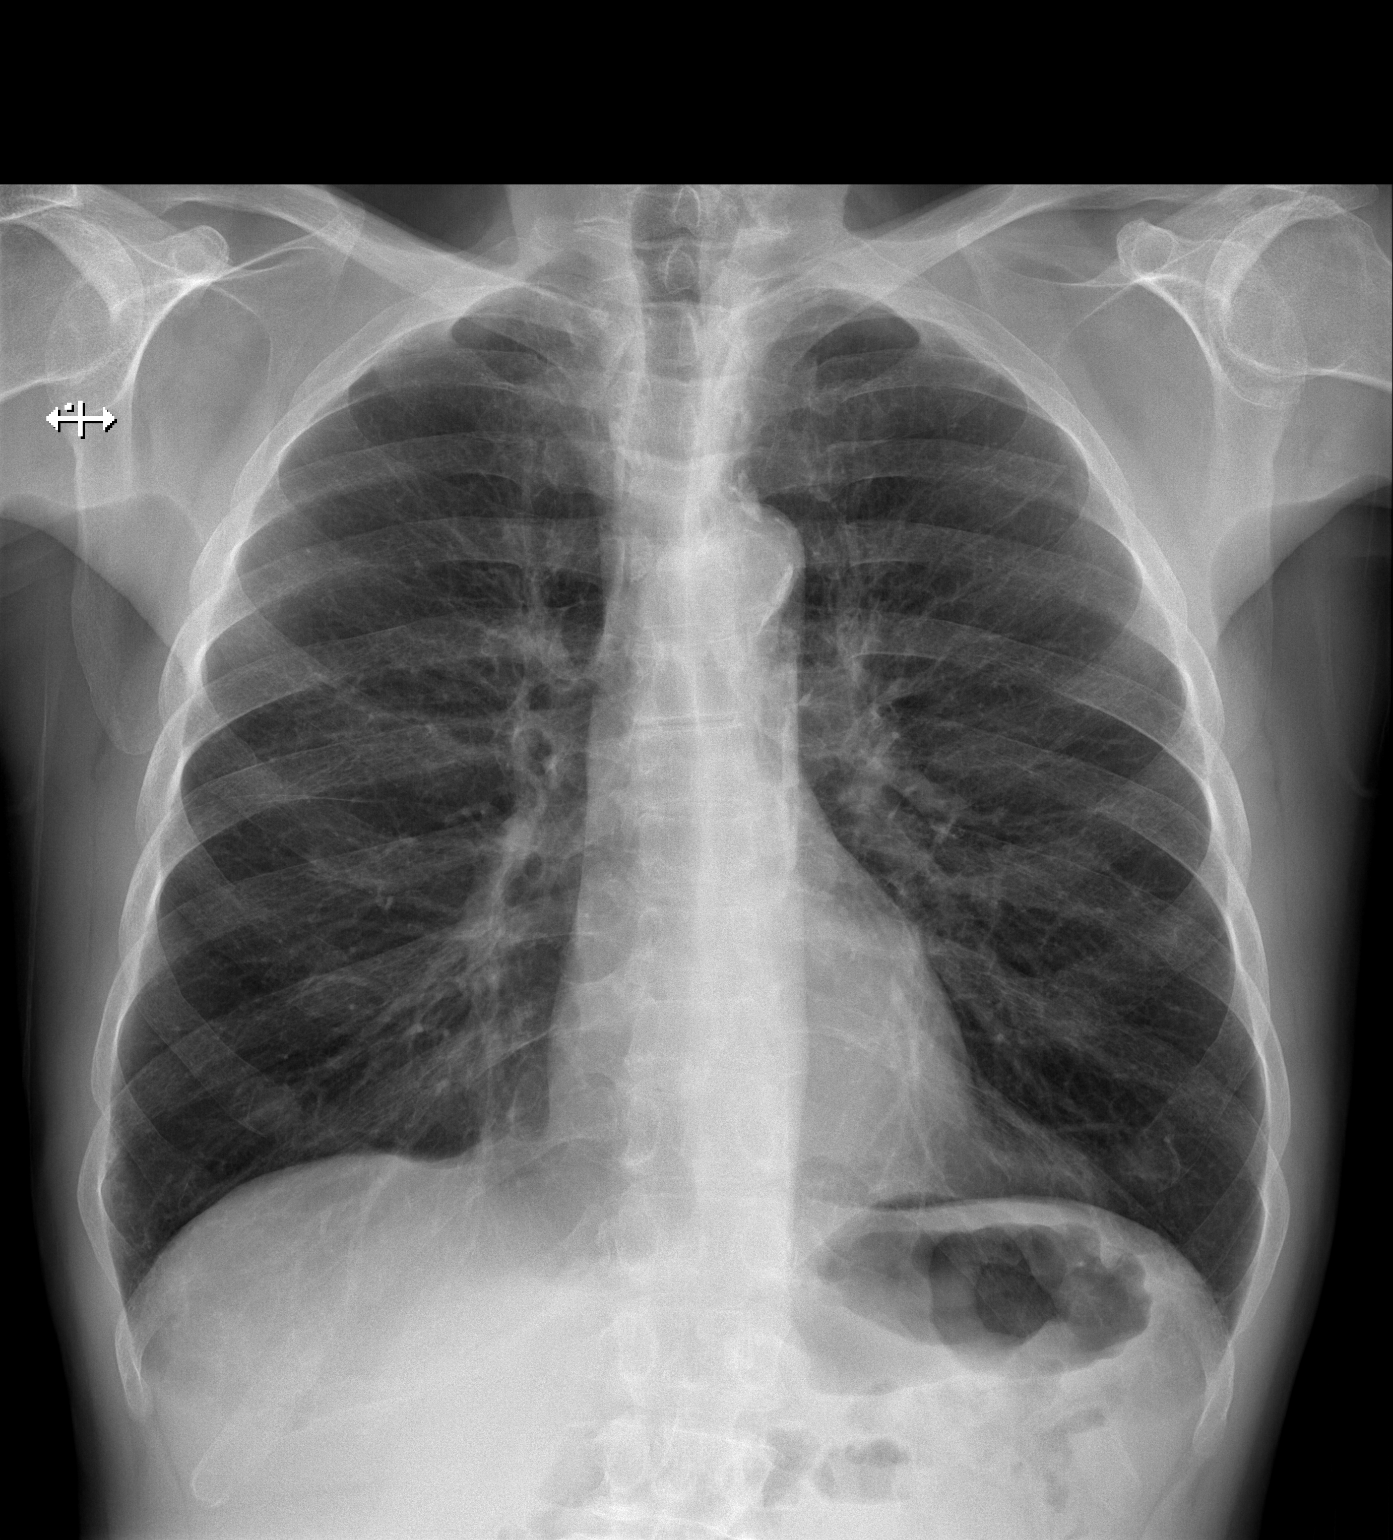

[w chest lat]
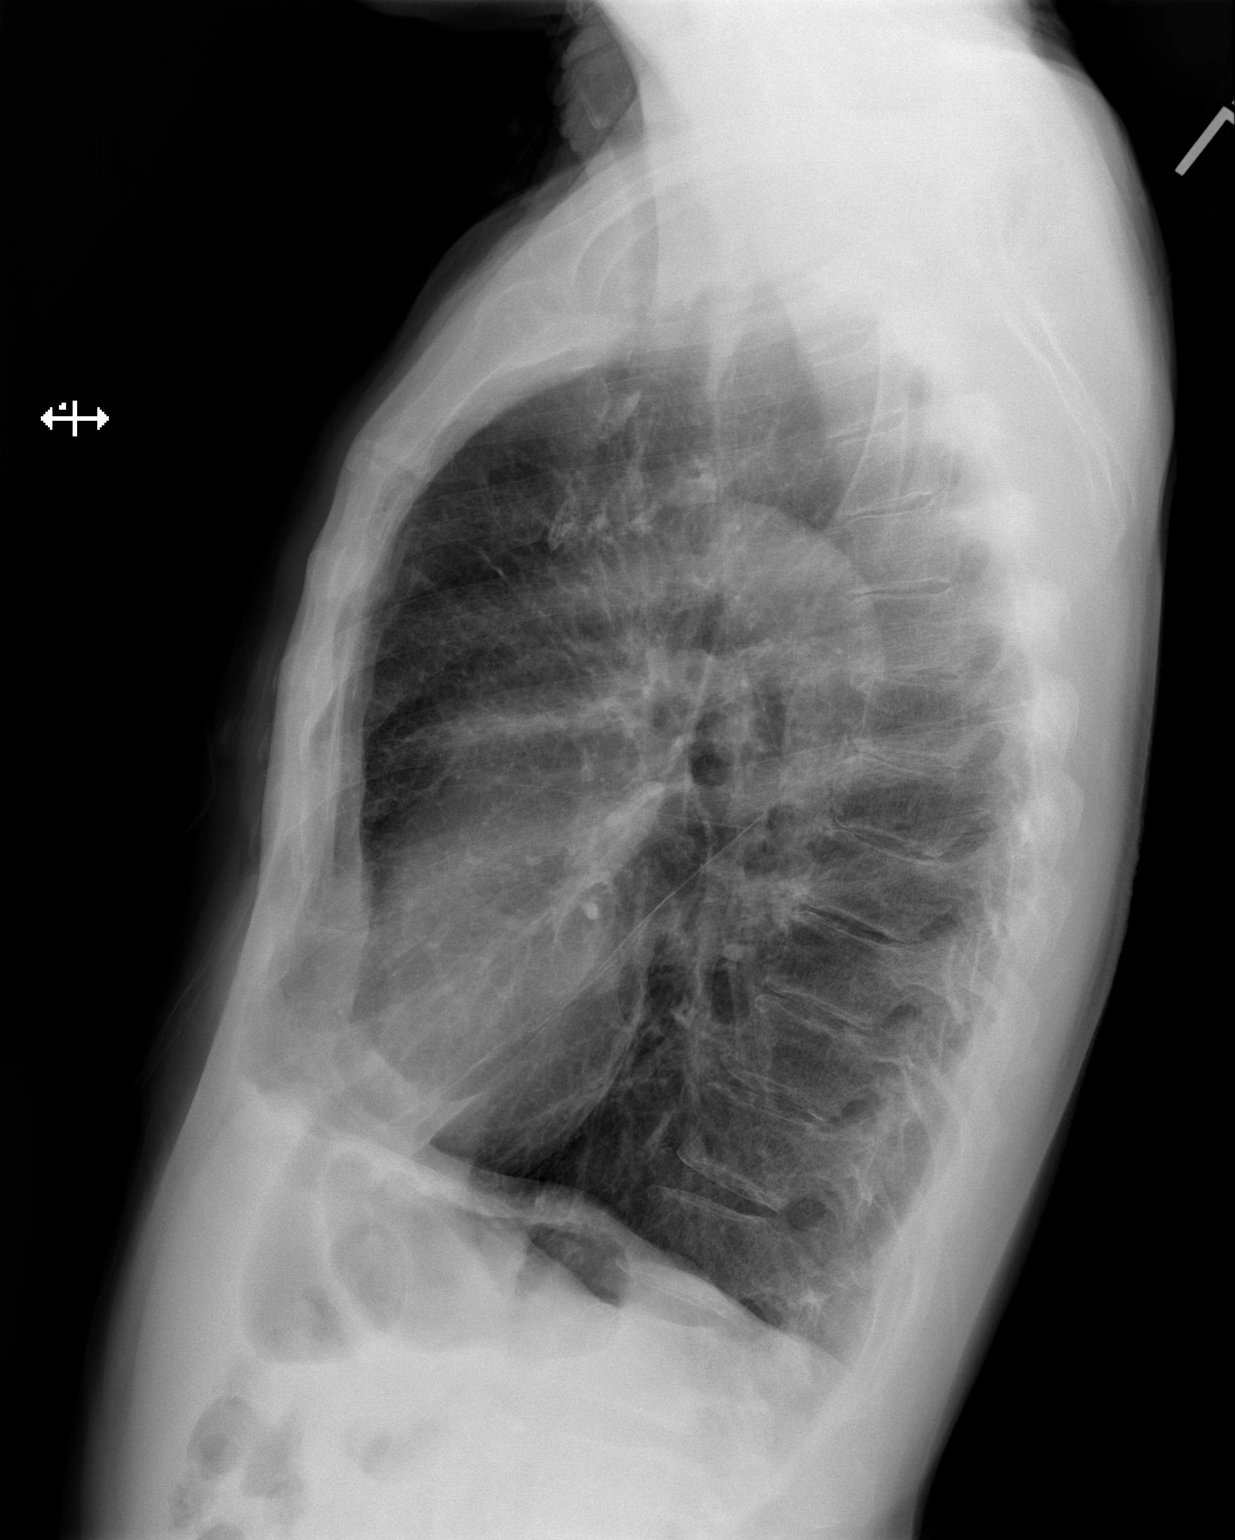

[2 of 2 positions shown; findings below may reference images not displayed]

FINDINGS: The cardiac shadow is stable. The lungs are hyperinflated consistent
with COPD. Mild interstitial changes are noted. No focal infiltrate
or sizable effusion is seen. No bony abnormality is noted.
IMPRESSION: COPD without acute abnormality.

## 2015-01-24 DIAGNOSIS — G8929 Other chronic pain: Secondary | ICD-10-CM | POA: Diagnosis not present

## 2015-01-24 DIAGNOSIS — M79672 Pain in left foot: Secondary | ICD-10-CM | POA: Diagnosis not present

## 2015-01-24 DIAGNOSIS — M79605 Pain in left leg: Secondary | ICD-10-CM | POA: Diagnosis not present

## 2015-03-02 DIAGNOSIS — G890 Central pain syndrome: Secondary | ICD-10-CM | POA: Diagnosis not present

## 2015-03-02 DIAGNOSIS — G8929 Other chronic pain: Secondary | ICD-10-CM | POA: Diagnosis not present

## 2015-03-02 DIAGNOSIS — M79672 Pain in left foot: Secondary | ICD-10-CM | POA: Diagnosis not present

## 2015-03-02 DIAGNOSIS — G541 Lumbosacral plexus disorders: Secondary | ICD-10-CM | POA: Diagnosis not present

## 2015-03-02 DIAGNOSIS — M79605 Pain in left leg: Secondary | ICD-10-CM | POA: Diagnosis not present

## 2015-03-02 DIAGNOSIS — R202 Paresthesia of skin: Secondary | ICD-10-CM | POA: Diagnosis not present

## 2015-03-02 DIAGNOSIS — G603 Idiopathic progressive neuropathy: Secondary | ICD-10-CM | POA: Diagnosis not present

## 2015-03-04 ENCOUNTER — Ambulatory Visit: Payer: Medicare Other | Admitting: Family

## 2015-03-04 ENCOUNTER — Other Ambulatory Visit (HOSPITAL_COMMUNITY): Payer: Medicare Other

## 2015-03-23 DIAGNOSIS — I739 Peripheral vascular disease, unspecified: Secondary | ICD-10-CM | POA: Diagnosis not present

## 2015-03-23 DIAGNOSIS — E559 Vitamin D deficiency, unspecified: Secondary | ICD-10-CM | POA: Diagnosis not present

## 2015-03-23 DIAGNOSIS — F419 Anxiety disorder, unspecified: Secondary | ICD-10-CM | POA: Diagnosis not present

## 2015-03-23 DIAGNOSIS — E785 Hyperlipidemia, unspecified: Secondary | ICD-10-CM | POA: Diagnosis not present

## 2015-03-23 DIAGNOSIS — I10 Essential (primary) hypertension: Secondary | ICD-10-CM | POA: Diagnosis not present

## 2015-03-23 DIAGNOSIS — D509 Iron deficiency anemia, unspecified: Secondary | ICD-10-CM | POA: Diagnosis not present

## 2015-03-23 DIAGNOSIS — F329 Major depressive disorder, single episode, unspecified: Secondary | ICD-10-CM | POA: Diagnosis not present

## 2015-03-30 DIAGNOSIS — G8929 Other chronic pain: Secondary | ICD-10-CM | POA: Diagnosis not present

## 2015-03-30 DIAGNOSIS — M79672 Pain in left foot: Secondary | ICD-10-CM | POA: Diagnosis not present

## 2015-03-30 DIAGNOSIS — M79605 Pain in left leg: Secondary | ICD-10-CM | POA: Diagnosis not present

## 2015-03-30 DIAGNOSIS — Z79891 Long term (current) use of opiate analgesic: Secondary | ICD-10-CM | POA: Diagnosis not present

## 2015-05-09 DIAGNOSIS — G890 Central pain syndrome: Secondary | ICD-10-CM | POA: Diagnosis not present

## 2015-05-09 DIAGNOSIS — R202 Paresthesia of skin: Secondary | ICD-10-CM | POA: Diagnosis not present

## 2015-05-09 DIAGNOSIS — Z79891 Long term (current) use of opiate analgesic: Secondary | ICD-10-CM | POA: Diagnosis not present

## 2015-05-09 DIAGNOSIS — M25572 Pain in left ankle and joints of left foot: Secondary | ICD-10-CM | POA: Diagnosis not present

## 2015-05-13 DIAGNOSIS — I10 Essential (primary) hypertension: Secondary | ICD-10-CM | POA: Diagnosis not present

## 2015-05-13 DIAGNOSIS — F419 Anxiety disorder, unspecified: Secondary | ICD-10-CM | POA: Diagnosis not present

## 2015-05-13 DIAGNOSIS — J449 Chronic obstructive pulmonary disease, unspecified: Secondary | ICD-10-CM | POA: Diagnosis not present

## 2015-05-13 DIAGNOSIS — F329 Major depressive disorder, single episode, unspecified: Secondary | ICD-10-CM | POA: Diagnosis not present

## 2015-05-13 DIAGNOSIS — E785 Hyperlipidemia, unspecified: Secondary | ICD-10-CM | POA: Diagnosis not present

## 2015-05-13 DIAGNOSIS — I739 Peripheral vascular disease, unspecified: Secondary | ICD-10-CM | POA: Diagnosis not present

## 2015-05-23 DIAGNOSIS — L602 Onychogryphosis: Secondary | ICD-10-CM | POA: Diagnosis not present

## 2015-05-23 DIAGNOSIS — M21372 Foot drop, left foot: Secondary | ICD-10-CM | POA: Diagnosis not present

## 2015-06-06 DIAGNOSIS — Z79891 Long term (current) use of opiate analgesic: Secondary | ICD-10-CM | POA: Diagnosis not present

## 2015-06-06 DIAGNOSIS — M79605 Pain in left leg: Secondary | ICD-10-CM | POA: Diagnosis not present

## 2015-06-06 DIAGNOSIS — R202 Paresthesia of skin: Secondary | ICD-10-CM | POA: Diagnosis not present

## 2015-06-06 DIAGNOSIS — M545 Low back pain: Secondary | ICD-10-CM | POA: Diagnosis not present

## 2015-06-06 DIAGNOSIS — M79672 Pain in left foot: Secondary | ICD-10-CM | POA: Diagnosis not present

## 2015-06-06 DIAGNOSIS — G890 Central pain syndrome: Secondary | ICD-10-CM | POA: Diagnosis not present

## 2015-06-14 DIAGNOSIS — L02611 Cutaneous abscess of right foot: Secondary | ICD-10-CM | POA: Diagnosis not present

## 2015-06-16 ENCOUNTER — Ambulatory Visit: Payer: Medicare Other | Admitting: Family

## 2015-06-16 ENCOUNTER — Encounter (HOSPITAL_COMMUNITY): Payer: Medicare Other

## 2015-07-04 DIAGNOSIS — M545 Low back pain: Secondary | ICD-10-CM | POA: Diagnosis not present

## 2015-07-04 DIAGNOSIS — G8929 Other chronic pain: Secondary | ICD-10-CM | POA: Diagnosis not present

## 2015-07-04 DIAGNOSIS — R202 Paresthesia of skin: Secondary | ICD-10-CM | POA: Diagnosis not present

## 2015-07-04 DIAGNOSIS — M25572 Pain in left ankle and joints of left foot: Secondary | ICD-10-CM | POA: Diagnosis not present

## 2015-07-04 DIAGNOSIS — M79605 Pain in left leg: Secondary | ICD-10-CM | POA: Diagnosis not present

## 2015-07-04 DIAGNOSIS — Z79891 Long term (current) use of opiate analgesic: Secondary | ICD-10-CM | POA: Diagnosis not present

## 2015-07-21 ENCOUNTER — Encounter: Payer: Self-pay | Admitting: Family

## 2015-07-22 ENCOUNTER — Ambulatory Visit (INDEPENDENT_AMBULATORY_CARE_PROVIDER_SITE_OTHER)
Admission: RE | Admit: 2015-07-22 | Discharge: 2015-07-22 | Disposition: A | Discharge status: Home / Self Care | Payer: Medicare Other | Source: Ambulatory Visit | Attending: Family | Admitting: Family

## 2015-07-22 ENCOUNTER — Ambulatory Visit (HOSPITAL_COMMUNITY)
Admission: RE | Admit: 2015-07-22 | Discharge: 2015-07-22 | Disposition: A | Discharge status: Home / Self Care | Payer: Medicare Other | Source: Ambulatory Visit | Attending: Vascular Surgery | Admitting: Vascular Surgery

## 2015-07-22 ENCOUNTER — Other Ambulatory Visit: Payer: Self-pay | Admitting: Vascular Surgery

## 2015-07-22 ENCOUNTER — Encounter: Payer: Self-pay | Admitting: Family

## 2015-07-22 ENCOUNTER — Ambulatory Visit (INDEPENDENT_AMBULATORY_CARE_PROVIDER_SITE_OTHER): Payer: Medicare Other | Admitting: Family

## 2015-07-22 ENCOUNTER — Other Ambulatory Visit: Payer: Self-pay | Admitting: Family

## 2015-07-22 VITALS — BP 168/70 | HR 61 | Ht 64.0 in | Wt 117.6 lb

## 2015-07-22 DIAGNOSIS — Z9889 Other specified postprocedural states: Secondary | ICD-10-CM

## 2015-07-22 DIAGNOSIS — Z48812 Encounter for surgical aftercare following surgery on the circulatory system: Secondary | ICD-10-CM

## 2015-07-22 DIAGNOSIS — I70219 Atherosclerosis of native arteries of extremities with intermittent claudication, unspecified extremity: Secondary | ICD-10-CM

## 2015-07-22 DIAGNOSIS — I739 Peripheral vascular disease, unspecified: Secondary | ICD-10-CM

## 2015-07-22 DIAGNOSIS — I779 Disorder of arteries and arterioles, unspecified: Secondary | ICD-10-CM | POA: Diagnosis not present

## 2015-07-22 DIAGNOSIS — I6523 Occlusion and stenosis of bilateral carotid arteries: Secondary | ICD-10-CM

## 2015-07-22 DIAGNOSIS — Z95828 Presence of other vascular implants and grafts: Secondary | ICD-10-CM

## 2015-07-22 DIAGNOSIS — R0989 Other specified symptoms and signs involving the circulatory and respiratory systems: Secondary | ICD-10-CM

## 2015-07-22 DIAGNOSIS — Z87891 Personal history of nicotine dependence: Secondary | ICD-10-CM

## 2015-07-22 NOTE — Patient Instructions (Signed)

## 2015-07-22 NOTE — Progress Notes (Signed)
[facility] [place] HISTORY AND PHYSICAL   MRN : [id number]  History of Present Illness:   Thomas Ferrell is a 74 y.o. male patient of 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 He is also s/p right CEA on 10/21/06. He had a preoperative TIA; he still has balance issues since this. He denies any history of amaurosis fugax or speech difficulties.   He still complains of burning numbness tingling in his left foot. He is now being followed for this at the pain clinic. He denies claudication. He has a remnant of the left anterior tibial aspect ulcer that is healing since the revascularization.  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 He walks with a cane, states no difficulties with falling, denies non healing wounds elsewhere.  Pt reports that he walks briskly at least 30 minutes daily.  Pt states his blood pressure usually runs 140-150/50's, states he has not yet taken his AM medications including for blood pressure.  Pt Diabetic: No Pt smoker: former smoker, quit in 2007  Pt meds include: Statin :Yes Betablocker: No ASA: yes, 325 mg daily Other anticoagulants/antiplatelets: no    Current Outpatient Prescriptions  Medication Sig Dispense Refill  . acetaminophen (TYLENOL) 500 MG tablet Take 1,000 mg by mouth every 6 (six) hours as needed for pain.    Marland Kitchen amLODipine (NORVASC) 5 MG tablet Take 10 mg by mouth daily.     . collagenase (SANTYL) ointment Apply 1 application topically daily. (Patient not taking: Reported on 07/22/2015) 30 g 1  . docusate sodium (COLACE) 100 MG capsule Take 1 capsule (100 mg total) by mouth every 12 (twelve) hours. (Patient not taking: Reported on 07/22/2015) 30 capsule 0  . feeding supplement, ENSURE COMPLETE, (ENSURE COMPLETE) LIQD  Take 237 mLs by mouth 3 (three) times daily with meals.    . gabapentin (NEURONTIN) 300 MG capsule Take 2 capsules by mouth 3 (three) times daily.    Marland Kitchen levETIRAcetam (KEPPRA) 250 MG tablet Take 250 mg by mouth 2 (two) times daily.    Marland Kitchen lisinopril (PRINIVIL,ZESTRIL) 20 MG tablet Take 20 mg by mouth daily.    Marland Kitchen LYRICA 75 MG capsule TAKE ONE CAPSULE EVERY 12 HOURS  0  . oxyCODONE (OXY IR/ROXICODONE) 5 MG immediate release tablet Take 10 mg by mouth every 8 (eight) hours as needed for severe pain.    Marland Kitchen oxyCODONE-acetaminophen (PERCOCET/ROXICET) 5-325 MG per tablet May take 1 tablet every 4-6 hrs/ prn/ pain 20 tablet 0  . sertraline (ZOLOFT) 50 MG tablet Take 50 mg by mouth daily.  1  . simvastatin (ZOCOR) 20 MG tablet Take 20 mg by mouth daily.     No current facility-administered medications for this visit.    Past Medical History  Diagnosis Date  . Hypertension   . Dyslipidemia   . Cerebrovascular disease     right brain CVA 2007  . Lumbago   . Abnormality of gait 03/03/2013  . CVA (cerebral vascular accident) 2007    r-cva  . COPD (chronic obstructive pulmonary disease)   . Cough productive of clear sputum     TX RECENT SINUS INFECTION   . bladder ca dx'd 11/2009    chemo/xrt comp 02/2010    Social History Social History  Substance  Use Topics  . Smoking status: Former Smoker    Types: Cigarettes    Quit date: 11/12/2005  . Smokeless tobacco: Never Used  . Alcohol Use: 12.6 oz/week    21 Cans of beer per week    Family History Family History  Problem Relation Age of Onset  . Stroke Mother   . Cancer Father   . Dementia Father   . Cancer Sister     Surgical History Past Surgical History  Procedure Laterality Date  . Carotid artery angioplasty  2009  . Back surgery  2010  . Bladder cancer    . Laminectomy  09/08/2013    L 2 L3 L4 L5       Dr Luiz Ochoa  . Femoral-femoral bypass graft N/A 12/09/2013    Procedure: BYPASS GRAFT FEMORAL-FEMORAL ARTERY WITH BILATERAL  ENDARTERECTOMY ;  Surgeon: Elam Dutch, MD;  Location: [facility] OR;  Service: Vascular;  Laterality: N/A;  . Femoral-popliteal bypass graft Left 12/09/2013    Procedure: BYPASS GRAFT FEMORAL-POPLITEAL ARTERY - LEFT ;  Surgeon: Elam Dutch, MD;  Location: [place];  Service: Vascular;  Laterality: Left;  . Abdominal aortagram N/A 12/07/2013    Procedure: ABDOMINAL Maxcine Ham;  Surgeon: Angelia Mould, MD;  Location: [facility] CATH LAB;  Service: Cardiovascular;  Laterality: N/A;    Allergies  Allergen Reactions  . Morphine And Related Itching    Lasts about 1 week beyond use.  Itching to entire body.    Current Outpatient Prescriptions  Medication Sig Dispense Refill  . acetaminophen (TYLENOL) 500 MG tablet Take 1,000 mg by mouth every 6 (six) hours as needed for pain.    Marland Kitchen amLODipine (NORVASC) 5 MG tablet Take 10 mg by mouth daily.     . collagenase (SANTYL) ointment Apply 1 application topically daily. (Patient not taking: Reported on 07/22/2015) 30 g 1  . docusate sodium (COLACE) 100 MG capsule Take 1 capsule (100 mg total) by mouth every 12 (twelve) hours. (Patient not taking: Reported on 07/22/2015) 30 capsule 0  . feeding supplement, ENSURE COMPLETE, (ENSURE COMPLETE) LIQD Take 237 mLs by mouth 3 (three) times daily with meals.    . gabapentin (NEURONTIN) 300 MG capsule Take 2 capsules by mouth 3 (three) times daily.    Marland Kitchen levETIRAcetam (KEPPRA) 250 MG tablet Take 250 mg by mouth 2 (two) times daily.    Marland Kitchen lisinopril (PRINIVIL,ZESTRIL) 20 MG tablet Take 20 mg by mouth daily.    Marland Kitchen LYRICA 75 MG capsule TAKE ONE CAPSULE EVERY 12 HOURS  0  . oxyCODONE (OXY IR/ROXICODONE) 5 MG immediate release tablet Take 10 mg by mouth every 8 (eight) hours as needed for severe pain.    Marland Kitchen oxyCODONE-acetaminophen (PERCOCET/ROXICET) 5-325 MG per tablet May take 1 tablet every 4-6 hrs/ prn/ pain 20 tablet 0  . sertraline (ZOLOFT) 50 MG tablet Take 50 mg by mouth daily.  1  . simvastatin (ZOCOR) 20 MG tablet Take 20  mg by mouth daily.     No current facility-administered medications for this visit.     REVIEW OF SYSTEMS: See HPI for pertinent positives and negatives.  Physical Examination Filed Vitals:   07/22/15 1156 07/22/15 1202  BP: 191/80 168/70  Pulse: 61   Height: 5\' 4"  (1.626 m)   Weight: 117 lb 9.6 oz (53.343 kg)   SpO2: 97%    Body mass index is 20.18 kg/(m^2).  General: A&O x 3, WDWN. Gait: using cane, limp Eyes: PERRLA. Pulmonary:non labored, occasional moist cough Cardiac:  regular Rythm   Aorta is not palpable. Radial pulses: are 2+ palpable and =   VASCULAR EXAM: Extremities without ischemic changes  without Gangrene; with closed granulating wound over left anterior tibial area, dry, no drainage, no peripheral edema.     LE Pulses Right Left   FEMORAL 2+ palpable 2+ palpable    POPLITEAL not palpable  not palpable   POSTERIOR TIBIAL not palpable  not palpable    DORSALIS PEDIS  ANTERIOR TIBIAL 2+ palpable  2+ palpable    Abdomen: soft, NT, no palpable masses, graft above pubis is palpable. Skin: no rashes, no ulcers, see extremities. Musculoskeletal: no muscle wasting or atrophy. Neurologic: A&O X 3; Appropriate Affect; MOTOR FUNCTION: moving all extremities equally, motor strength 5/5 throughout. Speech is fluent/normal. CN 2-12 intact.          Non-Invasive Vascular Imaging (07/22/2015):  CEREBROVASCULAR DUPLEX EVALUATION    INDICATION: Carotid stenosis    PREVIOUS INTERVENTION(S): Right carotid endarterectomy 10/21/2006    DUPLEX EXAM:     RIGHT  LEFT  Peak Systolic Velocities (cm/s) End Diastolic Velocities (cm/s) Plaque LOCATION Peak Systolic Velocities (cm/s) End Diastolic Velocities (cm/s) Plaque  78 18  CCA PROXIMAL 131  19 HT  110 22 HT CCA MID 142 25 HT  79 14 HT CCA DISTAL 186 21 HT  105 7 HT ECA 214 20 HT  77 20  ICA PROXIMAL 134 34 HT  94 23  ICA MID 113 27   113 31  ICA DISTAL 105 22     carotid endarterectomy ICA / CCA Ratio (PSV)   Antegrade Vertebral Flow Antegrade  740 Brachial Systolic Pressure (mmHg) 814  Triphasic Brachial Artery Waveforms Triphasic    Plaque Morphology:  HM = Homogeneous, HT = Heterogeneous, CP = Calcific Plaque, SP = Smooth Plaque, IP = Irregular Plaque     ADDITIONAL FINDINGS:     IMPRESSION: Right internal carotid artery is patent with history of carotid endarterectomy, mild hyperplasia present at the proximal and mid-distal patch without hemodynamically significant changes present. Left internal carotid artery stenosis present in the less than 40% range. Left external carotid artery stenosis present.    Compared to the previous exam:  Essentially unchanged since previous study on 06/17/2014.      LOWER EXTREMITY ARTERIAL DUPLEX EVALUATION    INDICATION: Peripheral vascular disease     PREVIOUS INTERVENTION(S): Right to left femoral-femoral bypass graft and left femoral-popliteal bypass graft 12/09/2013.    DUPLEX EXAM:     RIGHT  LEFT   Peak Systolic Velocity (cm/s) Ratio (if abnormal) Waveform  Peak Systolic Velocity (cm/s) Ratio (if abnormal) Waveform     Inflow Artery 70  T     Proximal Anastomosis 94  B     Proximal Graft 62  B     Mid Graft 50  B      Distal Graft 87  B     Distal Anastomosis 54  B     Outflow Artery 70/79  B/B  0.89/0.69 Today's ABI / TBI 0.93/1.02  0.84/0.77 Previous ABI / TBI (12/09/2014  ) 1.01/0.79    Waveform:    M - Monophasic       B - Biphasic       T - Triphasic  If Ankle Brachial Index (ABI) or Toe Brachial Index (TBI) performed, please see complete report     ADDITIONAL FINDINGS: Patent right to left femoral-femoral bypass graft with velocities ranging from 101cm/sec to 44cm/sec.  IMPRESSION: Patent left  femoral to popliteal artery bypass graft. Unable to visualize the left peroneal artery due to patient sensitivity to probe pressure and neuropathy.    Compared to the previous exam:  Stable right ankle brachial index and decreased left since previous study on 12/09/2014.       ASSESSMENT:  Thomas Ferrell is a 74 y.o. 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 He is also s/p right CEA on 10/21/06. He had a preoperative TIA.  Today's carotid Duplex suggests right ICA is  patent with history of carotid endarterectomy, mild hyperplasia present at the proximal and mid-distal patch without hemodynamically significant changes. Left internal carotid artery stenosis less than 40%. Essentially unchanged since previous study on 06/17/2014.   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  He has no claudication symptoms.     PLAN:   Continue graduated walking program.  Based on today's exam and non-invasive vascular lab results, the patient will follow up in 6 months with the following tests: fem-fem bypass graft Duplex, left LE arterial Duplex, and ABI's; carotid Duplex in a year. I discussed in depth with the patient the nature of atherosclerosis, and emphasized the importance of maximal medical management including strict control of blood pressure, blood glucose, and lipid levels, obtaining regular exercise, and cessation of smoking.  The patient is aware that without maximal medical management the underlying atherosclerotic disease process will progress, limiting the benefit of any interventions.  The patient was given information about stroke prevention and what symptoms should prompt the  patient to seek immediate medical care.  The patient was given information about PAD including signs, symptoms, treatment, what symptoms should prompt the patient to seek immediate medical care, and risk reduction measures to take. Thank you for allowing [place] to participate in this patient's care.  Clemon Chambers, RN, MSN, FNP-C [facility] Office: [phone]  Clinic MD: Bridgett Larsson 07/22/2015 12:19 PM

## 2015-08-01 DIAGNOSIS — G890 Central pain syndrome: Secondary | ICD-10-CM | POA: Diagnosis not present

## 2015-08-01 DIAGNOSIS — G603 Idiopathic progressive neuropathy: Secondary | ICD-10-CM | POA: Diagnosis not present

## 2015-08-01 DIAGNOSIS — M25551 Pain in right hip: Secondary | ICD-10-CM | POA: Diagnosis not present

## 2015-08-01 DIAGNOSIS — Z79891 Long term (current) use of opiate analgesic: Secondary | ICD-10-CM | POA: Diagnosis not present

## 2015-08-01 DIAGNOSIS — M79604 Pain in right leg: Secondary | ICD-10-CM | POA: Diagnosis not present

## 2015-08-01 DIAGNOSIS — M25572 Pain in left ankle and joints of left foot: Secondary | ICD-10-CM | POA: Diagnosis not present

## 2015-08-01 DIAGNOSIS — M79651 Pain in right thigh: Secondary | ICD-10-CM | POA: Diagnosis not present

## 2015-08-19 DIAGNOSIS — H52223 Regular astigmatism, bilateral: Secondary | ICD-10-CM | POA: Diagnosis not present

## 2015-08-19 DIAGNOSIS — H5203 Hypermetropia, bilateral: Secondary | ICD-10-CM | POA: Diagnosis not present

## 2015-08-19 DIAGNOSIS — H2513 Age-related nuclear cataract, bilateral: Secondary | ICD-10-CM | POA: Diagnosis not present

## 2015-08-19 DIAGNOSIS — H524 Presbyopia: Secondary | ICD-10-CM | POA: Diagnosis not present

## 2015-08-21 DIAGNOSIS — Z23 Encounter for immunization: Secondary | ICD-10-CM | POA: Diagnosis not present

## 2015-08-29 DIAGNOSIS — M25561 Pain in right knee: Secondary | ICD-10-CM | POA: Diagnosis not present

## 2015-08-29 DIAGNOSIS — G890 Central pain syndrome: Secondary | ICD-10-CM | POA: Diagnosis not present

## 2015-08-29 DIAGNOSIS — M545 Low back pain: Secondary | ICD-10-CM | POA: Diagnosis not present

## 2015-08-29 DIAGNOSIS — M5441 Lumbago with sciatica, right side: Secondary | ICD-10-CM | POA: Diagnosis not present

## 2015-09-07 DIAGNOSIS — R319 Hematuria, unspecified: Secondary | ICD-10-CM | POA: Diagnosis not present

## 2015-09-07 DIAGNOSIS — R300 Dysuria: Secondary | ICD-10-CM | POA: Diagnosis not present

## 2015-09-07 DIAGNOSIS — M545 Low back pain: Secondary | ICD-10-CM | POA: Diagnosis not present

## 2015-09-13 DIAGNOSIS — R3912 Poor urinary stream: Secondary | ICD-10-CM | POA: Diagnosis not present

## 2015-09-13 DIAGNOSIS — Z8551 Personal history of malignant neoplasm of bladder: Secondary | ICD-10-CM | POA: Diagnosis not present

## 2015-09-16 DIAGNOSIS — Z8551 Personal history of malignant neoplasm of bladder: Secondary | ICD-10-CM | POA: Diagnosis not present

## 2015-09-16 DIAGNOSIS — R310 Gross hematuria: Secondary | ICD-10-CM | POA: Diagnosis not present

## 2015-09-26 DIAGNOSIS — G8929 Other chronic pain: Secondary | ICD-10-CM | POA: Diagnosis not present

## 2015-09-26 DIAGNOSIS — Z79891 Long term (current) use of opiate analgesic: Secondary | ICD-10-CM | POA: Diagnosis not present

## 2015-09-26 DIAGNOSIS — M79605 Pain in left leg: Secondary | ICD-10-CM | POA: Diagnosis not present

## 2015-09-26 DIAGNOSIS — M79672 Pain in left foot: Secondary | ICD-10-CM | POA: Diagnosis not present

## 2015-09-26 DIAGNOSIS — M545 Low back pain: Secondary | ICD-10-CM | POA: Diagnosis not present

## 2015-10-24 DIAGNOSIS — M79672 Pain in left foot: Secondary | ICD-10-CM | POA: Diagnosis not present

## 2015-10-24 DIAGNOSIS — M79662 Pain in left lower leg: Secondary | ICD-10-CM | POA: Diagnosis not present

## 2015-10-24 DIAGNOSIS — G8929 Other chronic pain: Secondary | ICD-10-CM | POA: Diagnosis not present

## 2015-11-25 DIAGNOSIS — G890 Central pain syndrome: Secondary | ICD-10-CM | POA: Diagnosis not present

## 2015-11-25 DIAGNOSIS — M545 Low back pain: Secondary | ICD-10-CM | POA: Diagnosis not present

## 2015-11-25 DIAGNOSIS — G894 Chronic pain syndrome: Secondary | ICD-10-CM | POA: Diagnosis not present

## 2015-11-25 DIAGNOSIS — M79672 Pain in left foot: Secondary | ICD-10-CM | POA: Diagnosis not present

## 2015-11-25 DIAGNOSIS — G8929 Other chronic pain: Secondary | ICD-10-CM | POA: Diagnosis not present

## 2015-12-23 DIAGNOSIS — M79662 Pain in left lower leg: Secondary | ICD-10-CM | POA: Diagnosis not present

## 2015-12-23 DIAGNOSIS — G8929 Other chronic pain: Secondary | ICD-10-CM | POA: Diagnosis not present

## 2015-12-23 DIAGNOSIS — Z79891 Long term (current) use of opiate analgesic: Secondary | ICD-10-CM | POA: Diagnosis not present

## 2015-12-23 DIAGNOSIS — M79672 Pain in left foot: Secondary | ICD-10-CM | POA: Diagnosis not present

## 2015-12-23 DIAGNOSIS — M545 Low back pain: Secondary | ICD-10-CM | POA: Diagnosis not present

## 2015-12-23 DIAGNOSIS — G890 Central pain syndrome: Secondary | ICD-10-CM | POA: Diagnosis not present

## 2016-01-02 DIAGNOSIS — I739 Peripheral vascular disease, unspecified: Secondary | ICD-10-CM | POA: Diagnosis not present

## 2016-01-02 DIAGNOSIS — F329 Major depressive disorder, single episode, unspecified: Secondary | ICD-10-CM | POA: Diagnosis not present

## 2016-01-02 DIAGNOSIS — F419 Anxiety disorder, unspecified: Secondary | ICD-10-CM | POA: Diagnosis not present

## 2016-01-02 DIAGNOSIS — Z23 Encounter for immunization: Secondary | ICD-10-CM | POA: Diagnosis not present

## 2016-01-02 DIAGNOSIS — E785 Hyperlipidemia, unspecified: Secondary | ICD-10-CM | POA: Diagnosis not present

## 2016-01-02 DIAGNOSIS — R7303 Prediabetes: Secondary | ICD-10-CM | POA: Diagnosis not present

## 2016-01-02 DIAGNOSIS — J449 Chronic obstructive pulmonary disease, unspecified: Secondary | ICD-10-CM | POA: Diagnosis not present

## 2016-01-02 DIAGNOSIS — Z1211 Encounter for screening for malignant neoplasm of colon: Secondary | ICD-10-CM | POA: Diagnosis not present

## 2016-01-02 DIAGNOSIS — I10 Essential (primary) hypertension: Secondary | ICD-10-CM | POA: Diagnosis not present

## 2016-01-12 DIAGNOSIS — Z1211 Encounter for screening for malignant neoplasm of colon: Secondary | ICD-10-CM | POA: Diagnosis not present

## 2016-01-16 DIAGNOSIS — G894 Chronic pain syndrome: Secondary | ICD-10-CM | POA: Diagnosis not present

## 2016-01-16 DIAGNOSIS — M79672 Pain in left foot: Secondary | ICD-10-CM | POA: Diagnosis not present

## 2016-01-16 DIAGNOSIS — Z79891 Long term (current) use of opiate analgesic: Secondary | ICD-10-CM | POA: Diagnosis not present

## 2016-01-16 DIAGNOSIS — M545 Low back pain: Secondary | ICD-10-CM | POA: Diagnosis not present

## 2016-01-16 DIAGNOSIS — M79662 Pain in left lower leg: Secondary | ICD-10-CM | POA: Diagnosis not present

## 2016-01-25 ENCOUNTER — Encounter: Payer: Self-pay | Admitting: Family

## 2016-01-26 ENCOUNTER — Ambulatory Visit (HOSPITAL_COMMUNITY)
Admission: RE | Admit: 2016-01-26 | Discharge: 2016-01-26 | Disposition: A | Discharge status: Home / Self Care | Payer: Medicare Other | Source: Ambulatory Visit | Attending: Family | Admitting: Family

## 2016-01-26 ENCOUNTER — Ambulatory Visit (INDEPENDENT_AMBULATORY_CARE_PROVIDER_SITE_OTHER)
Admission: RE | Admit: 2016-01-26 | Discharge: 2016-01-26 | Disposition: A | Discharge status: Home / Self Care | Payer: Medicare Other | Source: Ambulatory Visit | Attending: Family | Admitting: Family

## 2016-01-26 DIAGNOSIS — Z48812 Encounter for surgical aftercare following surgery on the circulatory system: Secondary | ICD-10-CM | POA: Insufficient documentation

## 2016-01-26 DIAGNOSIS — E785 Hyperlipidemia, unspecified: Secondary | ICD-10-CM | POA: Diagnosis not present

## 2016-01-26 DIAGNOSIS — Z95828 Presence of other vascular implants and grafts: Secondary | ICD-10-CM | POA: Insufficient documentation

## 2016-01-26 DIAGNOSIS — I10 Essential (primary) hypertension: Secondary | ICD-10-CM | POA: Diagnosis not present

## 2016-01-26 DIAGNOSIS — R0989 Other specified symptoms and signs involving the circulatory and respiratory systems: Secondary | ICD-10-CM | POA: Diagnosis present

## 2016-02-02 ENCOUNTER — Encounter: Payer: Self-pay | Admitting: Family

## 2016-02-02 ENCOUNTER — Ambulatory Visit (INDEPENDENT_AMBULATORY_CARE_PROVIDER_SITE_OTHER): Payer: Medicare Other | Admitting: Family

## 2016-02-02 ENCOUNTER — Other Ambulatory Visit: Payer: Self-pay | Admitting: *Deleted

## 2016-02-02 VITALS — BP 124/67 | HR 71 | Ht 64.0 in | Wt 125.0 lb

## 2016-02-02 DIAGNOSIS — Z9889 Other specified postprocedural states: Secondary | ICD-10-CM

## 2016-02-02 DIAGNOSIS — I779 Disorder of arteries and arterioles, unspecified: Secondary | ICD-10-CM | POA: Diagnosis not present

## 2016-02-02 DIAGNOSIS — Z95828 Presence of other vascular implants and grafts: Secondary | ICD-10-CM | POA: Diagnosis not present

## 2016-02-02 DIAGNOSIS — I6521 Occlusion and stenosis of right carotid artery: Secondary | ICD-10-CM | POA: Diagnosis not present

## 2016-02-02 DIAGNOSIS — I739 Peripheral vascular disease, unspecified: Secondary | ICD-10-CM

## 2016-02-02 DIAGNOSIS — Z48812 Encounter for surgical aftercare following surgery on the circulatory system: Secondary | ICD-10-CM

## 2016-02-02 DIAGNOSIS — I6523 Occlusion and stenosis of bilateral carotid arteries: Secondary | ICD-10-CM

## 2016-02-02 DIAGNOSIS — Z87891 Personal history of nicotine dependence: Secondary | ICD-10-CM

## 2016-02-02 NOTE — Progress Notes (Signed)
[facility] [place] HISTORY AND PHYSICAL -PAD  History of Present Illness Thomas Ferrell is a 75 y.o. male patient of 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 He is also s/p right CEA on 10/21/06. He had a preoperative TIA; he still has balance issues since this. He denies any history of amaurosis fugax or speech difficulties. He denies any subsequent neurological events.   He still complains of burning numbness tingling in his left foot. He is now being followed for this at the pain clinic. He denies claudication. He has a remnant of the left anterior tibial aspect ulcer that is healing since the revascularization.  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 He walks with a cane, states no difficulties with falling, denies non healing wounds elsewhere.  Pt reports that he walks briskly at least 30 minutes daily.  Pt states his blood pressure usually runs 140-150/50's, states he has not yet taken his AM medications including for blood pressure.  Pt Diabetic: No Pt smoker: former smoker, quit in 2007  Pt meds include: Statin :Yes Betablocker: No ASA: yes, 325 mg daily Other anticoagulants/antiplatelets: no    Past Medical History  Diagnosis Date  . Hypertension   . Dyslipidemia   . Cerebrovascular disease     right brain CVA 2007  . Lumbago   . Abnormality of gait 03/03/2013  . CVA (cerebral vascular accident) ([place]) 2007    r-cva  . COPD (chronic obstructive pulmonary disease) ([place])   . Cough productive of clear sputum     TX RECENT SINUS INFECTION   . bladder ca dx'd 11/2009    chemo/xrt comp 02/2010    Social History Social History  Substance Use Topics  . Smoking status: Former Smoker     Types: Cigarettes    Quit date: 11/12/2005  . Smokeless tobacco: Never Used  . Alcohol Use: 12.6 oz/week    21 Cans of beer per week    Family History Family History  Problem Relation Age of Onset  . Stroke Mother   . Cancer Father   . Dementia Father   . Cancer Sister     Past Surgical History  Procedure Laterality Date  . Carotid artery angioplasty  2009  . Back surgery  2010  . Bladder cancer    . Laminectomy  09/08/2013    L 2 L3 L4 L5       Dr Luiz Ochoa  . Femoral-femoral bypass graft N/A 12/09/2013    Procedure: BYPASS GRAFT FEMORAL-FEMORAL ARTERY WITH BILATERAL ENDARTERECTOMY ;  Surgeon: Elam Dutch, MD;  Location: [facility] OR;  Service: Vascular;  Laterality: N/A;  . Femoral-popliteal bypass graft Left 12/09/2013    Procedure: BYPASS GRAFT FEMORAL-POPLITEAL ARTERY - LEFT ;  Surgeon: Elam Dutch, MD;  Location: [facility];  Service: Vascular;  Laterality: Left;  . Abdominal aortagram N/A 12/07/2013    Procedure: ABDOMINAL Maxcine Ham;  Surgeon: Angelia Mould, MD;  Location: [facility] CATH LAB;  Service: Cardiovascular;  Laterality: N/A;    Allergies  Allergen Reactions  . Morphine And Related Itching    Lasts about 1 week beyond use.  Itching to entire body.    Current Outpatient Prescriptions  Medication Sig Dispense  Refill  . acetaminophen (TYLENOL) 500 MG tablet Take 1,000 mg by mouth every 6 (six) hours as needed for pain.    Marland Kitchen amLODipine (NORVASC) 5 MG tablet Take 10 mg by mouth daily.     . feeding supplement, ENSURE COMPLETE, (ENSURE COMPLETE) LIQD Take 237 mLs by mouth 3 (three) times daily with meals.    . gabapentin (NEURONTIN) 300 MG capsule Take 2 capsules by mouth 3 (three) times daily.    Marland Kitchen levETIRAcetam (KEPPRA) 250 MG tablet Take 250 mg by mouth 2 (two) times daily.    Marland Kitchen lisinopril (PRINIVIL,ZESTRIL) 20 MG tablet Take 20 mg by mouth daily.    Marland Kitchen LYRICA 75 MG capsule TAKE ONE CAPSULE EVERY 12 HOURS  0  . oxyCODONE (OXY IR/ROXICODONE) 5 MG immediate  release tablet Take 10 mg by mouth every 8 (eight) hours as needed for severe pain.    Marland Kitchen sertraline (ZOLOFT) 50 MG tablet Take 50 mg by mouth daily.  1  . simvastatin (ZOCOR) 20 MG tablet Take 20 mg by mouth daily.    . collagenase (SANTYL) ointment Apply 1 application topically daily. (Patient not taking: Reported on 07/22/2015) 30 g 1  . docusate sodium (COLACE) 100 MG capsule Take 1 capsule (100 mg total) by mouth every 12 (twelve) hours. (Patient not taking: Reported on 07/22/2015) 30 capsule 0  . oxyCODONE-acetaminophen (PERCOCET/ROXICET) 5-325 MG per tablet May take 1 tablet every 4-6 hrs/ prn/ pain (Patient not taking: Reported on 02/02/2016) 20 tablet 0   No current facility-administered medications for this visit.    ROS: See HPI for pertinent positives and negatives.   Physical Examination  Filed Vitals:   02/02/16 0945  BP: 124/67  Pulse: 71  Height: 5\' 4"  (1.626 m)  Weight: 125 lb (56.7 kg)  SpO2: 95%   Body mass index is 21.45 kg/(m^2).  General: A&O x 3, WDWN. Gait: using cane, limp Eyes: PERRLA. Pulmonary:non labored, occasional moist cough Cardiac: regular Rhythm, no detected murmur. He has bilateral carotid artery bruits.    Aorta is not palpable. Radial pulses: are 2+ palpable and = Fem-fem graft pulse is readily palpable.   VASCULAR EXAM: Extremities without ischemic changes  without Gangrene; with closed granulating wound over left anterior tibial area, dry, no drainage, no peripheral edema. Thick toenails of both great toes and left 2nd-4th toes.     LE Pulses Right Left   FEMORAL 2+ palpable 2+ palpable    POPLITEAL not palpable  not palpable   POSTERIOR TIBIAL not palpable  1+ palpable    DORSALIS PEDIS  ANTERIOR TIBIAL not  palpable  2+ palpable    Abdomen: soft, NT, no palpable masses, graft above pubis is palpable. Skin: no rashes, no ulcers, see extremities. Musculoskeletal: no muscle wasting or atrophy. Neurologic: A&O X 3; Appropriate Affect; MOTOR FUNCTION: moving all extremities equally, motor strength 5/5 throughout. Speech is fluent/normal. CN 2-12 intact.                  Non-Invasive Vascular Imaging: DATE: 01/26/16 AORTO-ILIAC DUPLEX LIMITED EVALUATION  FEMORAL-FEMORAL BYPASS GRAFT  :  INDICATION: Follow-up femorofemoral bypass graft     PREVIOUS INTERVENTION(S): Right to left femorofemoral bypass graft with bilateral common femoral artery endarterectomy and left femoropopliteal arterial bypass graft 12/09/13    DUPLEX EXAM:     LOCATION Velocities                (cm/s) Diameter AP                          (  cm) Diameter Transverse (cm)  Inflow Artery     Right iliac   340    Anastomosis       Right        178    Graft         46    Anastomosis       Left    78 1.72   Outflow Artery             Right  Left  Today's ABI/TBI 0.89 0.95  Previous ABI/TBI   07/22/2015   0.87 0.93    If Ankle Brachial Index (ABI) or Toe Brachial Index (TBI) performed, please see complete report   ADDITIONAL FINDINGS:     IMPRESSION: 1. Elevated velocities in the native right iliac inflow. Waveforms are turbulent and suggestive of proximal disease; however, unable to evaluate due to bowel. 2. Widely patent femorofemoral bypass graft without evidence of restenosis or hyperplasia.     Compared to the previous exam:  Previous exam did not mention inflow disease; velocities were 317cm/second.    LOWER EXTREMITY ARTERIAL DUPLEX EVALUATION    INDICATION: Follow-up left lower extremity bypass graft     PREVIOUS INTERVENTION(S): Right to left femorofemoral bypass graft 12/09/2013 Left femoropopliteal arterial bypass graft 12/09/2013 Bilateral common femoral artery endarterectomy 12/09/2013     DUPLEX EXAM:     RIGHT  LEFT   Peak Systolic Velocity (cm/s) Ratio (if abnormal) Waveform  Peak Systolic Velocity (cm/s) Ratio (if abnormal) Waveform     Inflow Artery Fem graft       Proximal Anastomosis 70  T     Proximal Graft 81  T     Mid Graft 48  T      Distal Graft 56  T     Distal Anastomosis 108  T     Outflow Artery 119  T  0.89 Today's ABI / TBI 0.95  0.87 Previous ABI / TBI (07/22/2015 ) 0.94    Waveform:    M - Monophasic       B - Biphasic       T - Triphasic  If Ankle Brachial Index (ABI) or Toe Brachial Index (TBI) performed, please see complete report     ADDITIONAL FINDINGS:     IMPRESSION: Widely patent left femoropopliteal arterial bypass graft without evidence of restenosis or hyperplasia.     Compared to the previous exam:  No significant change compared to prior exam.      ASSESSMENT: MERREL FERRAIOLI is a 75 y.o. 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 He is also s/p right CEA on 10/21/06. He had a preoperative TIA; he still has balance issues since this. He has not had any known subsequent neurological event.  He walks at least 30 minutes daily.    01/26/16 fem-fem bypass graft duplex suggests elevated velocities in the native right iliac inflow. Waveforms are turbulent and suggestive of proximal disease; however, unable to evaluate due to bowel. Widely patent femorofemoral bypass graft without evidence of restenosis or hyperplasia. Previous exam did not mention inflow disease; velocities were 317cm/second.  01/26/16 left LE arterial duplex suggests a widely patent left femoropopliteal arterial bypass graft without evidence of restenosis or hyperplasia.  ABI's remain stable with triphasic waveforms bilaterally. Right LE has 89% arterial perfusion, left has 95%.    PLAN:  Continue extensive walking. Based on the patient's vascular studies and examination, pt  will return to  clinic in 1 year with carotid duplex, ABI's, fem-fem bypass graft duplex, and left LE arterial duplex; may cancel September 2017 appointment for carotid duplex.  Pt knows to notify us if he develops concerns re the circulation in his legs.   I discussed in depth with the patient the nature of atherosclerosis, and emphasized the importance of maximal medical management including strict control of blood pressure, blood glucose, and lipid levels, obtaining regular exercise, and continued cessation of smoking.  The patient is aware that without maximal medical management the underlying atherosclerotic disease process will progress, limiting the benefit of any interventions.  The patient was given information about PAD including signs, symptoms, treatment, what symptoms should prompt the patient to seek immediate medical care, and risk reduction measures to take.  Clemon Chambers, RN, MSN, FNP-C [facility] Phone: [phone]  Clinic MD: Early  02/02/2016 9:56 AM

## 2016-02-02 NOTE — Patient Instructions (Signed)
Peripheral Vascular Disease Peripheral vascular disease (PVD) is a disease of the blood vessels that are not part of your heart and brain. A simple term for PVD is poor circulation. In most cases, PVD narrows the blood vessels that carry blood from your heart to the rest of your body. This can result in a decreased supply of blood to your arms, legs, and internal organs, like your stomach or kidneys. However, it most often affects a person's lower legs and feet. There are two types of PVD.  Organic PVD. This is the more common type. It is caused by damage to the structure of blood vessels.  Functional PVD. This is caused by conditions that make blood vessels contract and tighten (spasm). Without treatment, PVD tends to get worse over time. PVD can also lead to acute ischemic limb. This is when an arm or limb suddenly has trouble getting enough blood. This is a medical emergency. CAUSES Each type of PVD has many different causes. The most common cause of PVD is buildup of a fatty material (plaque) inside of your arteries (atherosclerosis). Small amounts of plaque can break off from the walls of the blood vessels and become lodged in a smaller artery. This blocks blood flow and can cause acute ischemic limb. Other common causes of PVD include:  Blood clots that form inside of blood vessels.  Injuries to blood vessels.  Diseases that cause inflammation of blood vessels or cause blood vessel spasms.  Health behaviors and health history that increase your risk of developing PVD. RISK FACTORS  You may have a greater risk of PVD if you:  Have a family history of PVD.  Have certain medical conditions, including:  High cholesterol.  Diabetes.  High blood pressure (hypertension).  Coronary heart disease.  Past problems with blood clots.  Past injury, such as burns or a broken bone. These may have damaged blood vessels in your limbs.  Buerger disease. This is caused by inflamed blood  vessels in your hands and feet.  Some forms of arthritis.  Rare birth defects that affect the arteries in your legs.  Use tobacco.  Do not get enough exercise.  Are obese.  Are age 50 or older. SIGNS AND SYMPTOMS  PVD may cause many different symptoms. Your symptoms depend on what part of your body is not getting enough blood. Some common signs and symptoms include:  Cramps in your lower legs. This may be a symptom of poor leg circulation (claudication).  Pain and weakness in your legs while you are physically active that goes away when you rest (intermittent claudication).  Leg pain when at rest.  Leg numbness, tingling, or weakness.  Coldness in a leg or foot, especially when compared with the other leg.  Skin or hair changes. These can include:  Hair loss.  Shiny skin.  Pale or bluish skin.  Thick toenails.  Inability to get or maintain an erection (erectile dysfunction). People with PVD are more prone to developing ulcers and sores on their toes, feet, or legs. These may take longer than normal to heal. DIAGNOSIS Your health care provider may diagnose PVD from your signs and symptoms. The health care provider will also do a physical exam. You may have tests to find out what is causing your PVD and determine its severity. Tests may include:  Blood pressure recordings from your arms and legs and measurements of the strength of your pulses (pulse volume recordings).  Imaging studies using sound waves to take pictures of   the blood flow through your blood vessels (Doppler ultrasound).  Injecting a dye into your blood vessels before having imaging studies using:  X-rays (angiogram or arteriogram).  Computer-generated X-rays (CT angiogram).  A powerful electromagnetic field and a computer (magnetic resonance angiogram or MRA). TREATMENT Treatment for PVD depends on the cause of your condition and the severity of your symptoms. It also depends on your age. Underlying  causes need to be treated and controlled. These include long-lasting (chronic) conditions, such as diabetes, high cholesterol, and high blood pressure. You may need to first try making lifestyle changes and taking medicines. Surgery may be needed if these do not work. Lifestyle changes may include:  Quitting smoking.  Exercising regularly.  Following a low-fat, low-cholesterol diet. Medicines may include:  Blood thinners to prevent blood clots.  Medicines to improve blood flow.  Medicines to improve your blood cholesterol levels. Surgical procedures may include:  A procedure that uses an inflated balloon to open a blocked artery and improve blood flow (angioplasty).  A procedure to put in a tube (stent) to keep a blocked artery open (stent implant).  Surgery to reroute blood flow around a blocked artery (peripheral bypass surgery).  Surgery to remove dead tissue from an infected wound on the affected limb.  Amputation. This is surgical removal of the affected limb. This may be necessary in cases of acute ischemic limb that are not improved through medical or surgical treatments. HOME CARE INSTRUCTIONS  Take medicines only as directed by your health care provider.  Do not use any tobacco products, including cigarettes, chewing tobacco, or electronic cigarettes. If you need help quitting, ask your health care provider.  Lose weight if you are overweight, and maintain a healthy weight as directed by your health care provider.  Eat a diet that is low in fat and cholesterol. If you need help, ask your health care provider.  Exercise regularly. Ask your health care provider to suggest some good activities for you.  Use compression stockings or other mechanical devices as directed by your health care provider.  Take good care of your feet.  Wear comfortable shoes that fit well.  Check your feet often for any cuts or sores. SEEK MEDICAL CARE IF:  You have cramps in your legs  while walking.  You have leg pain when you are at rest.  You have coldness in a leg or foot.  Your skin changes.  You have erectile dysfunction.  You have cuts or sores on your feet that are not healing. SEEK IMMEDIATE MEDICAL CARE IF:  Your arm or leg turns cold and blue.  Your arms or legs become red, warm, swollen, painful, or numb.  You have chest pain or trouble breathing.  You suddenly have weakness in your face, arm, or leg.  You become very confused or lose the ability to speak.  You suddenly have a very bad headache or lose your vision.   This information is not intended to replace advice given to you by your health care provider. Make sure you discuss any questions you have with your health care provider.   Document Released: 12/06/2004 Document Revised: 11/19/2014 Document Reviewed: 04/08/2014 Elsevier Interactive Patient Education 2016 [employer]  

## 2016-02-13 DIAGNOSIS — M545 Low back pain: Secondary | ICD-10-CM | POA: Diagnosis not present

## 2016-02-13 DIAGNOSIS — G603 Idiopathic progressive neuropathy: Secondary | ICD-10-CM | POA: Diagnosis not present

## 2016-02-13 DIAGNOSIS — G8929 Other chronic pain: Secondary | ICD-10-CM | POA: Diagnosis not present

## 2016-02-13 DIAGNOSIS — G894 Chronic pain syndrome: Secondary | ICD-10-CM | POA: Diagnosis not present

## 2016-02-13 DIAGNOSIS — M79672 Pain in left foot: Secondary | ICD-10-CM | POA: Diagnosis not present

## 2016-02-13 DIAGNOSIS — M79662 Pain in left lower leg: Secondary | ICD-10-CM | POA: Diagnosis not present

## 2016-02-13 DIAGNOSIS — G541 Lumbosacral plexus disorders: Secondary | ICD-10-CM | POA: Diagnosis not present

## 2016-03-26 DIAGNOSIS — Z79891 Long term (current) use of opiate analgesic: Secondary | ICD-10-CM | POA: Diagnosis not present

## 2016-03-26 DIAGNOSIS — M545 Low back pain: Secondary | ICD-10-CM | POA: Diagnosis not present

## 2016-03-26 DIAGNOSIS — G894 Chronic pain syndrome: Secondary | ICD-10-CM | POA: Diagnosis not present

## 2016-03-26 DIAGNOSIS — M79605 Pain in left leg: Secondary | ICD-10-CM | POA: Diagnosis not present

## 2016-03-26 DIAGNOSIS — R208 Other disturbances of skin sensation: Secondary | ICD-10-CM | POA: Diagnosis not present

## 2016-05-07 DIAGNOSIS — G894 Chronic pain syndrome: Secondary | ICD-10-CM | POA: Diagnosis not present

## 2016-05-07 DIAGNOSIS — M545 Low back pain: Secondary | ICD-10-CM | POA: Diagnosis not present

## 2016-05-07 DIAGNOSIS — M79672 Pain in left foot: Secondary | ICD-10-CM | POA: Diagnosis not present

## 2016-05-07 DIAGNOSIS — M79662 Pain in left lower leg: Secondary | ICD-10-CM | POA: Diagnosis not present

## 2016-05-07 DIAGNOSIS — Z79891 Long term (current) use of opiate analgesic: Secondary | ICD-10-CM | POA: Diagnosis not present

## 2016-05-29 ENCOUNTER — Encounter (HOSPITAL_COMMUNITY): Payer: Self-pay

## 2016-05-29 ENCOUNTER — Emergency Department (HOSPITAL_COMMUNITY): Payer: Medicare Other

## 2016-05-29 ENCOUNTER — Emergency Department (HOSPITAL_COMMUNITY)
Admission: EM | Admit: 2016-05-29 | Discharge: 2016-05-29 | Disposition: A | Discharge status: Home / Self Care | Payer: Medicare Other | Attending: Emergency Medicine | Admitting: Emergency Medicine

## 2016-05-29 DIAGNOSIS — J189 Pneumonia, unspecified organism: Secondary | ICD-10-CM | POA: Insufficient documentation

## 2016-05-29 DIAGNOSIS — E785 Hyperlipidemia, unspecified: Secondary | ICD-10-CM | POA: Insufficient documentation

## 2016-05-29 DIAGNOSIS — Z79899 Other long term (current) drug therapy: Secondary | ICD-10-CM | POA: Diagnosis not present

## 2016-05-29 DIAGNOSIS — E876 Hypokalemia: Secondary | ICD-10-CM | POA: Insufficient documentation

## 2016-05-29 DIAGNOSIS — Z8673 Personal history of transient ischemic attack (TIA), and cerebral infarction without residual deficits: Secondary | ICD-10-CM | POA: Insufficient documentation

## 2016-05-29 DIAGNOSIS — R0602 Shortness of breath: Secondary | ICD-10-CM | POA: Insufficient documentation

## 2016-05-29 DIAGNOSIS — Z87891 Personal history of nicotine dependence: Secondary | ICD-10-CM | POA: Diagnosis not present

## 2016-05-29 DIAGNOSIS — R911 Solitary pulmonary nodule: Secondary | ICD-10-CM | POA: Diagnosis not present

## 2016-05-29 DIAGNOSIS — I10 Essential (primary) hypertension: Secondary | ICD-10-CM | POA: Diagnosis not present

## 2016-05-29 DIAGNOSIS — J449 Chronic obstructive pulmonary disease, unspecified: Secondary | ICD-10-CM | POA: Insufficient documentation

## 2016-05-29 DIAGNOSIS — R079 Chest pain, unspecified: Secondary | ICD-10-CM | POA: Diagnosis present

## 2016-05-29 LAB — HEPATIC FUNCTION PANEL
ALBUMIN: 4.6 g/dL (ref 3.5–5.0)
ALK PHOS: 115 U/L (ref 38–126)
ALT: 21 U/L (ref 17–63)
AST: 26 U/L (ref 15–41)
Bilirubin, Direct: <0.1 mg/dL — ABNORMAL LOW (ref 0.1–0.5)
TOTAL PROTEIN: 8.4 g/dL — AB (ref 6.5–8.1)
Total Bilirubin: 0.4 mg/dL (ref 0.3–1.2)

## 2016-05-29 LAB — I-STAT CHEM 8, ED
BUN: 8 mg/dL (ref 6–20)
CALCIUM ION: 1.14 mmol/L (ref 1.12–1.23)
CHLORIDE: 100 mmol/L — AB (ref 101–111)
Creatinine, Ser: 0.7 mg/dL (ref 0.61–1.24)
Glucose, Bld: 158 mg/dL — ABNORMAL HIGH (ref 65–99)
HCT: 43 % (ref 39.0–52.0)
Hemoglobin: 14.6 g/dL (ref 13.0–17.0)
POTASSIUM: 3.1 mmol/L — AB (ref 3.5–5.1)
SODIUM: 140 mmol/L (ref 135–145)
TCO2: 25 mmol/L (ref 0–100)

## 2016-05-29 LAB — BASIC METABOLIC PANEL
Anion gap: 12 (ref 5–15)
BUN: 9 mg/dL (ref 6–20)
CHLORIDE: 101 mmol/L (ref 101–111)
CO2: 25 mmol/L (ref 22–32)
Calcium: 9.2 mg/dL (ref 8.9–10.3)
Creatinine, Ser: 0.75 mg/dL (ref 0.61–1.24)
Glucose, Bld: 161 mg/dL — ABNORMAL HIGH (ref 65–99)
POTASSIUM: 3.1 mmol/L — AB (ref 3.5–5.1)
SODIUM: 138 mmol/L (ref 135–145)

## 2016-05-29 LAB — CBC
HEMATOCRIT: 40.5 % (ref 39.0–52.0)
Hemoglobin: 13.6 g/dL (ref 13.0–17.0)
MCH: 30.8 pg (ref 26.0–34.0)
MCHC: 33.6 g/dL (ref 30.0–36.0)
MCV: 91.8 fL (ref 78.0–100.0)
PLATELETS: 272 10*3/uL (ref 150–400)
RBC: 4.41 MIL/uL (ref 4.22–5.81)
RDW: 13 % (ref 11.5–15.5)
WBC: 16.5 10*3/uL — AB (ref 4.0–10.5)

## 2016-05-29 LAB — MAGNESIUM: MAGNESIUM: 1.8 mg/dL (ref 1.7–2.4)

## 2016-05-29 LAB — BRAIN NATRIURETIC PEPTIDE: B NATRIURETIC PEPTIDE 5: 92.3 pg/mL (ref 0.0–100.0)

## 2016-05-29 LAB — I-STAT TROPONIN, ED
TROPONIN I, POC: 0.01 ng/mL (ref 0.00–0.08)
Troponin i, poc: 0 ng/mL (ref 0.00–0.08)

## 2016-05-29 LAB — LIPASE, BLOOD: LIPASE: 17 U/L (ref 11–51)

## 2016-05-29 MED ORDER — HYDROMORPHONE HCL 1 MG/ML IJ SOLN
1.0000 mg | Freq: Once | INTRAMUSCULAR | Status: AC
Start: 2016-05-29 — End: 2016-05-29
  Administered 2016-05-29: 1 mg via INTRAVENOUS
  Filled 2016-05-29: qty 1

## 2016-05-29 MED ORDER — IOPAMIDOL (ISOVUE-370) INJECTION 76%
100.0000 mL | Freq: Once | INTRAVENOUS | Status: AC | PRN
  Administered 2016-05-29: 100 mL via INTRAVENOUS

## 2016-05-29 MED ORDER — POTASSIUM CHLORIDE CRYS ER 20 MEQ PO TBCR
40.0000 meq | EXTENDED_RELEASE_TABLET | Freq: Once | ORAL | Status: AC
  Administered 2016-05-29: 40 meq via ORAL
  Filled 2016-05-29: qty 2

## 2016-05-29 MED ORDER — OXYCODONE HCL 15 MG PO TABS: 15.0000 mg | ORAL_TABLET | ORAL | Status: DC | PRN

## 2016-05-29 MED ORDER — SODIUM CHLORIDE 0.9 % IV BOLUS (SEPSIS)
1000.0000 mL | Freq: Once | INTRAVENOUS | Status: AC
  Administered 2016-05-29: 1000 mL via INTRAVENOUS

## 2016-05-29 MED ORDER — AZITHROMYCIN 250 MG PO TABS: ORAL_TABLET | ORAL | Status: DC

## 2016-05-29 MED ORDER — POTASSIUM CHLORIDE CRYS ER 20 MEQ PO TBCR: 20.0000 meq | EXTENDED_RELEASE_TABLET | Freq: Every day | ORAL | Status: DC

## 2016-05-29 MED ORDER — DEXTROSE 5 % IV SOLN
1.0000 g | Freq: Once | INTRAVENOUS | Status: AC
  Administered 2016-05-29: 1 g via INTRAVENOUS
  Filled 2016-05-29: qty 10

## 2016-05-29 MED ORDER — ALBUTEROL SULFATE HFA 108 (90 BASE) MCG/ACT IN AERS
2.0000 | INHALATION_SPRAY | Freq: Once | RESPIRATORY_TRACT | Status: AC
  Administered 2016-05-29: 2 via RESPIRATORY_TRACT
  Filled 2016-05-29: qty 6.7

## 2016-05-29 MED ORDER — ONDANSETRON 4 MG PO TBDP
4.0000 mg | ORAL_TABLET | Freq: Once | ORAL | Status: AC
  Administered 2016-05-29: 4 mg via ORAL
  Filled 2016-05-29: qty 1

## 2016-05-29 MED ORDER — HYDROMORPHONE HCL 1 MG/ML IJ SOLN
0.5000 mg | Freq: Once | INTRAMUSCULAR | Status: AC
  Administered 2016-05-29: 0.5 mg via INTRAVENOUS
  Filled 2016-05-29: qty 1

## 2016-05-29 MED ORDER — AZITHROMYCIN 250 MG PO TABS
500.0000 mg | ORAL_TABLET | Freq: Once | ORAL | Status: AC
  Administered 2016-05-29: 500 mg via ORAL
  Filled 2016-05-29: qty 2

## 2016-05-29 NOTE — ED Notes (Signed)
PT DISCHARGED. INSTRUCTIONS AND PRESCRIPTIONS GIVEN. AAOX4. PT IN NO APPARENT DISTRESS OR PAIN. THE OPPORTUNITY TO ASK QUESTIONS WAS PROVIDED. 

## 2016-05-29 NOTE — ED Notes (Signed)
RESPIRATORY NOTIFIED, AS ORDERED.

## 2016-05-29 NOTE — ED Provider Notes (Signed)
CSN: [id number]     Arrival date & time 05/29/16  0551 History   First MD Initiated Contact with Patient 05/29/16 [phone]     Chief Complaint  Patient presents with  . Chest Pain     (Consider location/radiation/quality/duration/timing/severity/associated sxs/prior Treatment) HPI   Blood pressure 178/60, pulse 60, resp. rate 21, height 5\' 4"  (1.626 m), weight 58.968 kg, SpO2 98 %.  Thomas Ferrell is a 75 y.o. male past medical history significant for CVA, COPD, PAD complaining of acute onset of right anterior chest pain, 10 out of 10 described as sharp and steady which woke him from sleep at 0 300 radiating to the left side of the chest associated with nausea, diaphoresis and intermittent shortness of breath. Patient took Zantac and Pepcid at home with little relief. States he has never had pain this severe before but in the past and has had something similar was relieved with antacids. Has not been evaluated by cardiology in the last decade. Denies syncope, increasing peripheral edema, abdominal pain, vomiting, anticoagulation. Has had a full dose aspirin at 11 PM last night.  Past Medical History  Diagnosis Date  . Hypertension   . Dyslipidemia   . Cerebrovascular disease     right brain CVA 2007  . Lumbago   . Abnormality of gait 03/03/2013  . CVA (cerebral vascular accident) ([place]) 2007    r-cva  . COPD (chronic obstructive pulmonary disease) ([place])   . Cough productive of clear sputum     TX RECENT SINUS INFECTION   . bladder ca dx'd 11/2009    chemo/xrt comp 02/2010   Past Surgical History  Procedure Laterality Date  . Carotid artery angioplasty  2009  . Back surgery  2010  . Bladder cancer    . Laminectomy  09/08/2013    L 2 L3 L4 L5       Dr Luiz Ochoa  . Femoral-femoral bypass graft N/A 12/09/2013    Procedure: BYPASS GRAFT FEMORAL-FEMORAL ARTERY WITH BILATERAL ENDARTERECTOMY ;  Surgeon: Elam Dutch, MD;  Location: [facility] OR;  Service: Vascular;  Laterality: N/A;  .  Femoral-popliteal bypass graft Left 12/09/2013    Procedure: BYPASS GRAFT FEMORAL-POPLITEAL ARTERY - LEFT ;  Surgeon: Elam Dutch, MD;  Location: [place];  Service: Vascular;  Laterality: Left;  . Abdominal aortagram N/A 12/07/2013    Procedure: ABDOMINAL Maxcine Ham;  Surgeon: Angelia Mould, MD;  Location: [facility] CATH LAB;  Service: Cardiovascular;  Laterality: N/A;   Family History  Problem Relation Age of Onset  . Stroke Mother   . Cancer Father   . Dementia Father   . Cancer Sister    Social History  Substance Use Topics  . Smoking status: Former Smoker    Types: Cigarettes    Quit date: 11/12/2005  . Smokeless tobacco: Never Used  . Alcohol Use: No    Review of Systems  10 systems reviewed and found to be negative, except as noted in the HPI.  Allergies  Morphine and related  Home Medications   Prior to Admission medications   Medication Sig Start Date End Date Taking? Authorizing Provider  acetaminophen (TYLENOL) 500 MG tablet Take 1,000 mg by mouth every 6 (six) hours as needed for pain.   Yes Historical Provider, MD  amLODipine (NORVASC) 5 MG tablet Take 10 mg by mouth daily.  02/08/13  Yes Historical Provider, MD  aspirin EC 325 MG tablet Take 325 mg by mouth daily.   Yes Historical Provider, MD  cholecalciferol (VITAMIN D) 1000 units tablet Take 1,000 Units by mouth daily.   Yes Historical Provider, MD  feeding supplement, ENSURE COMPLETE, (ENSURE COMPLETE) LIQD Take 237 mLs by mouth 3 (three) times daily with meals. 12/11/13  Yes Samantha J Rhyne, PA-C  ferrous sulfate 325 (65 FE) MG EC tablet Take 325 mg by mouth daily with breakfast.   Yes Historical Provider, MD  lisinopril (PRINIVIL,ZESTRIL) 40 MG tablet Take 40 mg by mouth daily. 05/20/16  Yes Historical Provider, MD  LYRICA 75 MG capsule TAKE ONE CAPSULE EVERY 12 HOURS 07/06/15  Yes Historical Provider, MD  sertraline (ZOLOFT) 100 MG tablet Take 100 mg by mouth daily. 05/27/16  Yes Historical Provider, MD    simvastatin (ZOCOR) 20 MG tablet Take 20 mg by mouth daily. 02/26/13  Yes Historical Provider, MD  azithromycin (ZITHROMAX Z-PAK) 250 MG tablet 2 po day one, then 1 daily x 4 days 05/29/16   Elmyra Ricks Harrel Ferrone, PA-C  collagenase (SANTYL) ointment Apply 1 application topically daily. Patient not taking: Reported on 07/22/2015 03/25/14   Elam Dutch, MD  oxyCODONE (ROXICODONE) 15 MG immediate release tablet Take 1 tablet (15 mg total) by mouth every 4 (four) hours as needed for pain. 05/29/16   Cici Rodriges, PA-C  potassium chloride SA (K-DUR,KLOR-CON) 20 MEQ tablet Take 1 tablet (20 mEq total) by mouth daily. 05/29/16   Jehan Bonano, PA-C   BP 143/62 mmHg  Pulse 68  Temp(Src) 98.6 F (37 C) (Oral)  Resp 17  Ht 5\' 4"  (1.626 m)  Wt 58.968 kg  BMI 22.30 kg/m2  SpO2 99% Physical Exam  Constitutional: He is oriented to person, place, and time. He appears well-developed and well-nourished. No distress.  HENT:  Head: Normocephalic.  Mouth/Throat: Oropharynx is clear and moist.  Eyes: Conjunctivae and EOM are normal.  Neck: Normal range of motion. No JVD present. No tracheal deviation present.  Cardiovascular: Normal rate, regular rhythm and intact distal pulses.   Radial pulse equal bilaterally  Pulmonary/Chest: Effort normal and breath sounds normal. No stridor. No respiratory distress. He has no wheezes. He has no rales. He exhibits no tenderness.  Abdominal: Soft. He exhibits no distension and no mass. There is no tenderness. There is no rebound and no guarding.    Musculoskeletal: Normal range of motion. He exhibits no edema or tenderness.  No calf asymmetry, superficial collaterals, palpable cords, edema, Homans sign negative bilaterally.    Neurological: He is alert and oriented to person, place, and time.  Skin: Skin is warm. He is not diaphoretic.  Psychiatric: He has a normal mood and affect.  Nursing note and vitals reviewed.   ED Course  Procedures (including critical  care time) Labs Review Labs Reviewed  BASIC METABOLIC PANEL - Abnormal; Notable for the following:    Potassium 3.1 (*)    Glucose, Bld 161 (*)    All other components within normal limits  CBC - Abnormal; Notable for the following:    WBC 16.5 (*)    All other components within normal limits  HEPATIC FUNCTION PANEL - Abnormal; Notable for the following:    Total Protein 8.4 (*)    Bilirubin, Direct <0.1 (*)    All other components within normal limits  I-STAT CHEM 8, ED - Abnormal; Notable for the following:    Potassium 3.1 (*)    Chloride 100 (*)    Glucose, Bld 158 (*)    All other components within normal limits  CULTURE, BLOOD (ROUTINE X 2)  CULTURE, BLOOD (ROUTINE X 2)  BRAIN NATRIURETIC PEPTIDE  MAGNESIUM  LIPASE, BLOOD  I-STAT TROPOININ, ED  I-STAT TROPOININ, ED    Imaging Review Dg Chest 2 View  05/29/2016  CLINICAL DATA:  Chest pain EXAM: CHEST  2 VIEW COMPARISON:  12/22/2013 FINDINGS: There is a new rounded opacity near the left hilum with neighboring atelectasis or scar. There is no edema, consolidation, effusion, or pneumothorax. Chronic bronchitic markings. Normal heart size and aortic contours. Aortic atherosclerosis. IMPRESSION: 1. Left perihilar nodule.  Recommend chest CT. 2. History of COPD.  Stable bronchitic markings. Electronically Signed   By: Monte Fantasia M.D.   On: 05/29/2016 06:38   Ct Angio Chest Aorta W/cm &/or Wo/cm  05/29/2016  CLINICAL DATA:  Chest pain starting 3 a.m., nausea, abnormal chest x-ray, history of bladder cancer EXAM: CT ANGIOGRAPHY CHEST without and WITH CONTRAST TECHNIQUE: Multidetector CT imaging of the chest was performed using the standard protocol during bolus administration of intravenous contrast. Multiplanar CT image reconstructions and MIPs were obtained to evaluate the vascular anatomy. Unenhanced images of the chest were provided. CONTRAST:  100 cc Isovue COMPARISON:  CT chest 03/21/2010 FINDINGS: Cardiovascular: Unenhanced  images shows atherosclerotic calcifications of thoracic aorta and coronary arteries. Atherosclerotic calcifications are noted upper abdominal aorta, SMA origin, os celiac trunk origin and bilateral renal artery origin. Heart size within normal limits. No pericardial effusion. Arterial phase enhanced images demonstrates no evidence of aortic aneurysm or aortic dissection. The study is of excellent technical quality. No pulmonary embolus is noted. Pulmonary veins are unremarkable. Mediastinum/Nodes: There is no mediastinal hematoma or adenopathy. No hilar adenopathy is noted. Lungs/Pleura: Images of the lung parenchyma shows bilateral emphysematous changes especially in upper lobes. As noted on the chest x-ray there is triangular shape consolidation in lingula anteromedial with some air bronchogram. Some mucus bronchial plugging is noted in axial image 44. Findings are highly suspicious for small focal pneumonia. Additional focal infiltrate/ pneumonia noted in right middle lobe medially and laterally please see axial image 70. Axial image 69 there is 6 mm nodule in left base/left lower lobe anteriorly. Upper Abdomen: The visualized upper abdomen shows stable thickening bilateral adrenal glands left greater than right. There is fatty infiltration of the liver. No calcified gallstones are noted within visualize gallbladder. Visualized pancreas and spleen is unremarkable. Visualized upper kidneys are unremarkable. Musculoskeletal: Sagittal images of the spine shows degenerative changes thoracic spine. Sagittal view of the sternum is unremarkable. Review of the MIP images confirms the above findings. IMPRESSION: 1. No aortic aneurysm or aortic dissection.  No pulmonary embolus. 2. Atherosclerotic calcifications of thoracic aorta, abdominal aorta, celiac trunk and SMA origin, bilateral renal artery origin. Atherosclerotic calcifications of coronary arteries. 3. No mediastinal hematoma or adenopathy. There is small somewhat  triangular-shaped consolidation in lingula anteromedially with some air bronchogram. Findings highly suspicious for focal pneumonia. Additional focal infiltrate/pneumonia noted in right middle lobe laterally and medially. Follow-up to resolution after treatment is recommended. 4. There is 6 mm nodule in left base anteriorly/ left lower lobe. Non-contrast chest CT at 6-12 months is recommended. If the nodule is stable at time of repeat CT, then future CT at 18-24 months (from today's scan) is considered optional for low-risk patients, but is recommended for high-risk patients. This recommendation follows the consensus statement: Guidelines for Management of Incidental Pulmonary Nodules Detected on CT Images:From the Fleischner Society 2017; published online before print (10.1148/radiol.SG:5268862). 5. Bilateral emphysematous changes are noted. 6. Fatty infiltration of the liver. Stable bilateral  thickening of adrenal glands. Electronically Signed   By: Lahoma Crocker M.D.   On: 05/29/2016 08:07   I have personally reviewed and evaluated these images and lab results as part of my medical decision-making.   EKG Interpretation   Date/Time:  Tuesday May 29 2016 06:00:06 EDT Ventricular Rate:  62 PR Interval:    QRS Duration: 103 QT Interval:  465 QTC Calculation: 473 R Axis:   83 Text Interpretation:  Sinus rhythm Borderline right axis deviation Minimal  ST depression, diffuse leads Confirmed by Dina Rich  MD, Loma Sousa ([id number]) on  05/29/2016 6:20:51 AM Also confirmed by Dina Rich  MD, COURTNEY ([id number]),  editor [place]  CCT, BEVERLY ([id number])  on 05/29/2016 7:02:52 AM      MDM   Final diagnoses:  CAP (community acquired pneumonia)  Hypokalemia  Pulmonary nodule    Filed Vitals:   05/29/16 0700 05/29/16 0830 05/29/16 0910 05/29/16 1125  BP: 177/60 155/76  143/62  Pulse: 61 62  68  Temp:    98.6 F (37 C)  TempSrc:    Oral  Resp: 17 18  17   Height:      Weight:      SpO2: 100% 98% 89% 99%     Medications  ondansetron (ZOFRAN-ODT) disintegrating tablet 4 mg (4 mg Oral Given 05/29/16 0627)  HYDROmorphone (DILAUDID) injection 0.5 mg (0.5 mg Intravenous Given 05/29/16 0646)  HYDROmorphone (DILAUDID) injection 1 mg (1 mg Intravenous Given 05/29/16 0720)  iopamidol (ISOVUE-370) 76 % injection 100 mL (100 mLs Intravenous Contrast Given 05/29/16 0737)  potassium chloride SA (K-DUR,KLOR-CON) CR tablet 40 mEq (40 mEq Oral Given 05/29/16 0835)  cefTRIAXone (ROCEPHIN) 1 g in dextrose 5 % 50 mL IVPB (0 g Intravenous Stopped 05/29/16 0956)  azithromycin (ZITHROMAX) tablet 500 mg (500 mg Oral Given 05/29/16 0835)  sodium chloride 0.9 % bolus 1,000 mL (0 mLs Intravenous Stopped 05/29/16 1125)  albuterol (PROVENTIL HFA;VENTOLIN HFA) 108 (90 Base) MCG/ACT inhaler 2 puff (2 puffs Inhalation Given 05/29/16 1117)    Thomas Ferrell is 75 y.o. male presenting with Severe right-sided chest pain, 10 out of 10, appears to be affecting him in waves radiating to the left chest woke him from sleep at 3 AM. [place] on the acute severe onset of his pain consider dissection. Patient with history of extensive vasculopathy. Will order CT sections study of chest and abdomen. Blood work reassuring with a mild leukocytosis of 16.5, potassium is 3.1 however magnesium is normal, we'll supplement orally. Pro BNP is not elevated.   This is a shared visit with the attending physician who personally evaluated the patient and agrees with the care plan.   CT without dissection or aneurysm or PE. Diffuse atherosclerosis. Patient has a lingula consolidation in addition to a focal pneumonia in the right middle lobe also a 6 mm nodule in the left base. Pt confirms that he has not been inpatient for 3 months. Patient's oxygen level dipped to the high 80s after Dilaudid was administered, will ambulate him with a pulse ox.  Patient ambulated with sats between 87 and 88% he was largely asymptomatic not expressing any shortness of breath when he  walked. Case discussed with triad hospitalist Dr. Erlinda Hong who states that this patient would likely not benefit from admission. Feels that the hypoxia is probably typical for his baseline given his COPD. Recommends discharge with close follow-up by primary care, patient feels comfortable with this and we've had an extensive discussion of return precautions. Will check a delta troponin,  will also get RT to instruct this patient had a use a incentive spirometer and inhaler. Given pulmonology referral. Thus with attending who agrees the patient stable for discharge.  Evaluation does not show pathology that would require ongoing emergent intervention or inpatient treatment. Pt is hemodynamically stable and mentating appropriately. Discussed findings and plan with patient/guardian, who agrees with care plan. All questions answered. Return precautions discussed and outpatient follow up given.   Discharge Medication List as of 05/29/2016 11:04 AM    START taking these medications   Details  azithromycin (ZITHROMAX Z-PAK) 250 MG tablet 2 po day one, then 1 daily x 4 days, Print    potassium chloride SA (K-DUR,KLOR-CON) 20 MEQ tablet Take 1 tablet (20 mEq total) by mouth daily., Starting 05/29/2016, Until Discontinued, Print             Monico Blitz, PA-C 05/29/16 1437   Charlesetta Shanks, MD 06/06/16 2325

## 2016-05-29 NOTE — Progress Notes (Signed)
Instructed pt on IS technique. Pt pulls 1552ml with IS. Also instructed use of MDI with spacer. Good technique.

## 2016-05-29 NOTE — ED Notes (Signed)
Patient reports very little relief in pain, continues to have pain in frequent waves. Patient reports some dizziness and slight fogginess

## 2016-05-29 NOTE — Discharge Instructions (Signed)
Please follow with your primary care doctor in the next 2-3 days for a check-up. They must obtain records for further management.   Do not hesitate to return to the Emergency Department for any new, worsening or concerning symptoms including fever, worsening chest pain, worsening shortness of breath.   Your imaging today shows nodules in your lungs. Your primary care doctor must be made aware of this and further imaging may be ordered. Please make them aware that they will need to obtain records for further management.   Community-Acquired Pneumonia, Adult Pneumonia is an infection of the lungs. There are different types of pneumonia. One type can develop while a person is in a hospital. A different type, called community-acquired pneumonia, develops in people who are not, or have not recently been, in the hospital or other health care facility.  CAUSES Pneumonia may be caused by bacteria, viruses, or funguses. Community-acquired pneumonia is often caused by Streptococcus pneumonia bacteria. These bacteria are often passed from one person to another by breathing in droplets from the cough or sneeze of an infected person. RISK FACTORS The condition is more likely to develop in:  People who havechronic diseases, such as chronic obstructive pulmonary disease (COPD), asthma, congestive heart failure, cystic fibrosis, diabetes, or kidney disease.  People who haveearly-stage or late-stage HIV.  People who havesickle cell disease.  People who havehad their spleen removed (splenectomy).  People who havepoor Human resources officer.  People who havemedical conditions that increase the risk of breathing in (aspirating) secretions their own mouth and nose.   People who havea weakened immune system (immunocompromised).  People who smoke.  People whotravel to areas where pneumonia-causing germs commonly exist.  People whoare around animal habitats or animals that have pneumonia-causing germs,  including birds, bats, rabbits, cats, and farm animals. SYMPTOMS Symptoms of this condition include:  Adry cough.  A wet (productive) cough.  Fever.  Sweating.  Chest pain, especially when breathing deeply or coughing.  Rapid breathing or difficulty breathing.  Shortness of breath.  Shaking chills.  Fatigue.  Muscle aches. DIAGNOSIS Your health care provider will take a medical history and perform a physical exam. You may also have other tests, including:  Imaging studies of your chest, including X-rays.  Tests to check your blood oxygen level and other blood gases.  Other tests on blood, mucus (sputum), fluid around your lungs (pleural fluid), and urine. If your pneumonia is severe, other tests may be done to identify the specific cause of your illness. TREATMENT The type of treatment that you receive depends on many factors, such as the cause of your pneumonia, the medicines you take, and other medical conditions that you have. For most adults, treatment and recovery from pneumonia may occur at home. In some cases, treatment must happen in a hospital. Treatment may include:  Antibiotic medicines, if the pneumonia was caused by bacteria.  Antiviral medicines, if the pneumonia was caused by a virus.  Medicines that are given by mouth or through an IV tube.  Oxygen.  Respiratory therapy. Although rare, treating severe pneumonia may include:  Mechanical ventilation. This is done if you are not breathing well on your own and you cannot maintain a safe blood oxygen level.  Thoracentesis. This procedureremoves fluid around one lung or both lungs to help you breathe better. HOME CARE INSTRUCTIONS  Take over-the-counter and prescription medicines only as told by your health care provider.  Only takecough medicine if you are losing sleep. Understand that cough medicine can prevent your  body's natural ability to remove mucus from your lungs.  If you were prescribed an  antibiotic medicine, take it as told by your health care provider. Do not stop taking the antibiotic even if you start to feel better.  Sleep in a semi-upright position at night. Try sleeping in a reclining chair, or place a few pillows under your head.  Do not use tobacco products, including cigarettes, chewing tobacco, and e-cigarettes. If you need help quitting, ask your health care provider.  Drink enough water to keep your urine clear or pale yellow. This will help to thin out mucus secretions in your lungs. PREVENTION There are ways that you can decrease your risk of developing community-acquired pneumonia. Consider getting a pneumococcal vaccine if:  You are older than 74 years of age.  You are older than 74 years of age and are undergoing cancer treatment, have chronic lung disease, or have other medical conditions that affect your immune system. Ask your health care provider if this applies to you. There are different types and schedules of pneumococcal vaccines. Ask your health care provider which vaccination option is best for you. You may also prevent community-acquired pneumonia if you take these actions:  Get an influenza vaccine every year. Ask your health care provider which type of influenza vaccine is best for you.  Go to the dentist on a regular basis.  Wash your hands often. Use hand sanitizer if soap and water are not available. SEEK MEDICAL CARE IF:  You have a fever.  You are losing sleep because you cannot control your cough with cough medicine. SEEK IMMEDIATE MEDICAL CARE IF:  You have worsening shortness of breath.  You have increased chest pain.  Your sickness becomes worse, especially if you are an older adult or have a weakened immune system.  You cough up blood.   This information is not intended to replace advice given to you by your health care provider. Make sure you discuss any questions you have with your health care provider.   Document  Released: 10/29/2005 Document Revised: 07/20/2015 Document Reviewed: 02/23/2015 Elsevier Interactive Patient Education [employer].

## 2016-05-29 NOTE — ED Notes (Signed)
Patient c/o chest pain that began at 0300 this morning.  Patient states that feels SOB "at times".  Patient c/o of nausea, denies vomiting.  Patient describes the pain as pressure on the left and right side of chest.  Patient rates 10/10

## 2016-06-02 ENCOUNTER — Encounter (HOSPITAL_COMMUNITY): Payer: Self-pay

## 2016-06-02 ENCOUNTER — Inpatient Hospital Stay (HOSPITAL_COMMUNITY)
Admission: EM | Admit: 2016-06-02 | Discharge: 2016-06-08 | DRG: 853 | Disposition: A | Discharge status: Home Health | Payer: Medicare Other | Attending: Internal Medicine | Admitting: Internal Medicine

## 2016-06-02 ENCOUNTER — Emergency Department (HOSPITAL_COMMUNITY): Payer: Medicare Other

## 2016-06-02 DIAGNOSIS — I2510 Atherosclerotic heart disease of native coronary artery without angina pectoris: Secondary | ICD-10-CM | POA: Diagnosis present

## 2016-06-02 DIAGNOSIS — G629 Polyneuropathy, unspecified: Secondary | ICD-10-CM | POA: Diagnosis present

## 2016-06-02 DIAGNOSIS — J440 Chronic obstructive pulmonary disease with acute lower respiratory infection: Secondary | ICD-10-CM | POA: Diagnosis present

## 2016-06-02 DIAGNOSIS — R011 Cardiac murmur, unspecified: Secondary | ICD-10-CM | POA: Diagnosis present

## 2016-06-02 DIAGNOSIS — K830 Cholangitis: Secondary | ICD-10-CM | POA: Diagnosis not present

## 2016-06-02 DIAGNOSIS — K8012 Calculus of gallbladder with acute and chronic cholecystitis without obstruction: Secondary | ICD-10-CM | POA: Diagnosis not present

## 2016-06-02 DIAGNOSIS — Z87891 Personal history of nicotine dependence: Secondary | ICD-10-CM | POA: Diagnosis not present

## 2016-06-02 DIAGNOSIS — R197 Diarrhea, unspecified: Secondary | ICD-10-CM | POA: Diagnosis present

## 2016-06-02 DIAGNOSIS — I4581 Long QT syndrome: Secondary | ICD-10-CM | POA: Diagnosis present

## 2016-06-02 DIAGNOSIS — K8020 Calculus of gallbladder without cholecystitis without obstruction: Secondary | ICD-10-CM | POA: Diagnosis not present

## 2016-06-02 DIAGNOSIS — M545 Low back pain, unspecified: Secondary | ICD-10-CM | POA: Diagnosis present

## 2016-06-02 DIAGNOSIS — E876 Hypokalemia: Secondary | ICD-10-CM | POA: Diagnosis present

## 2016-06-02 DIAGNOSIS — K810 Acute cholecystitis: Secondary | ICD-10-CM | POA: Diagnosis not present

## 2016-06-02 DIAGNOSIS — A419 Sepsis, unspecified organism: Principal | ICD-10-CM | POA: Diagnosis present

## 2016-06-02 DIAGNOSIS — I70231 Atherosclerosis of native arteries of right leg with ulceration of thigh: Secondary | ICD-10-CM | POA: Diagnosis not present

## 2016-06-02 DIAGNOSIS — I10 Essential (primary) hypertension: Secondary | ICD-10-CM | POA: Diagnosis present

## 2016-06-02 DIAGNOSIS — J189 Pneumonia, unspecified organism: Secondary | ICD-10-CM

## 2016-06-02 DIAGNOSIS — G8929 Other chronic pain: Secondary | ICD-10-CM | POA: Diagnosis present

## 2016-06-02 DIAGNOSIS — K8050 Calculus of bile duct without cholangitis or cholecystitis without obstruction: Secondary | ICD-10-CM | POA: Diagnosis present

## 2016-06-02 DIAGNOSIS — K8067 Calculus of gallbladder and bile duct with acute and chronic cholecystitis with obstruction: Secondary | ICD-10-CM | POA: Diagnosis present

## 2016-06-02 DIAGNOSIS — K8000 Calculus of gallbladder with acute cholecystitis without obstruction: Secondary | ICD-10-CM | POA: Diagnosis present

## 2016-06-02 DIAGNOSIS — Z419 Encounter for procedure for purposes other than remedying health state, unspecified: Secondary | ICD-10-CM

## 2016-06-02 DIAGNOSIS — K311 Adult hypertrophic pyloric stenosis: Secondary | ICD-10-CM | POA: Diagnosis present

## 2016-06-02 DIAGNOSIS — I7025 Atherosclerosis of native arteries of other extremities with ulceration: Secondary | ICD-10-CM | POA: Diagnosis present

## 2016-06-02 DIAGNOSIS — Z7982 Long term (current) use of aspirin: Secondary | ICD-10-CM

## 2016-06-02 DIAGNOSIS — R1011 Right upper quadrant pain: Secondary | ICD-10-CM | POA: Diagnosis not present

## 2016-06-02 DIAGNOSIS — E785 Hyperlipidemia, unspecified: Secondary | ICD-10-CM | POA: Diagnosis present

## 2016-06-02 DIAGNOSIS — R509 Fever, unspecified: Secondary | ICD-10-CM | POA: Diagnosis not present

## 2016-06-02 DIAGNOSIS — K831 Obstruction of bile duct: Secondary | ICD-10-CM | POA: Diagnosis not present

## 2016-06-02 DIAGNOSIS — Z809 Family history of malignant neoplasm, unspecified: Secondary | ICD-10-CM

## 2016-06-02 DIAGNOSIS — K838 Other specified diseases of biliary tract: Secondary | ICD-10-CM | POA: Diagnosis not present

## 2016-06-02 DIAGNOSIS — K8001 Calculus of gallbladder with acute cholecystitis with obstruction: Secondary | ICD-10-CM | POA: Diagnosis not present

## 2016-06-02 DIAGNOSIS — K8051 Calculus of bile duct without cholangitis or cholecystitis with obstruction: Secondary | ICD-10-CM | POA: Diagnosis not present

## 2016-06-02 DIAGNOSIS — M5440 Lumbago with sciatica, unspecified side: Secondary | ICD-10-CM | POA: Diagnosis not present

## 2016-06-02 DIAGNOSIS — R0602 Shortness of breath: Secondary | ICD-10-CM | POA: Diagnosis not present

## 2016-06-02 DIAGNOSIS — Z418 Encounter for other procedures for purposes other than remedying health state: Secondary | ICD-10-CM

## 2016-06-02 DIAGNOSIS — Z885 Allergy status to narcotic agent status: Secondary | ICD-10-CM

## 2016-06-02 DIAGNOSIS — Z823 Family history of stroke: Secondary | ICD-10-CM

## 2016-06-02 DIAGNOSIS — I70209 Unspecified atherosclerosis of native arteries of extremities, unspecified extremity: Secondary | ICD-10-CM | POA: Diagnosis present

## 2016-06-02 DIAGNOSIS — Z8551 Personal history of malignant neoplasm of bladder: Secondary | ICD-10-CM

## 2016-06-02 DIAGNOSIS — I739 Peripheral vascular disease, unspecified: Secondary | ICD-10-CM | POA: Diagnosis not present

## 2016-06-02 DIAGNOSIS — K8066 Calculus of gallbladder and bile duct with acute and chronic cholecystitis without obstruction: Secondary | ICD-10-CM | POA: Diagnosis not present

## 2016-06-02 DIAGNOSIS — Z79899 Other long term (current) drug therapy: Secondary | ICD-10-CM

## 2016-06-02 DIAGNOSIS — R109 Unspecified abdominal pain: Secondary | ICD-10-CM

## 2016-06-02 DIAGNOSIS — L98499 Non-pressure chronic ulcer of skin of other sites with unspecified severity: Secondary | ICD-10-CM

## 2016-06-02 DIAGNOSIS — K660 Peritoneal adhesions (postprocedural) (postinfection): Secondary | ICD-10-CM | POA: Diagnosis present

## 2016-06-02 DIAGNOSIS — I69393 Ataxia following cerebral infarction: Secondary | ICD-10-CM

## 2016-06-02 DIAGNOSIS — J449 Chronic obstructive pulmonary disease, unspecified: Secondary | ICD-10-CM | POA: Diagnosis not present

## 2016-06-02 DIAGNOSIS — R05 Cough: Secondary | ICD-10-CM | POA: Diagnosis not present

## 2016-06-02 DIAGNOSIS — I509 Heart failure, unspecified: Secondary | ICD-10-CM | POA: Diagnosis not present

## 2016-06-02 LAB — CBC WITH DIFFERENTIAL/PLATELET
Basophils Absolute: 0 10*3/uL (ref 0.0–0.1)
Basophils Relative: 0 %
Eosinophils Absolute: 0 10*3/uL (ref 0.0–0.7)
Eosinophils Relative: 0 %
HEMATOCRIT: 37.2 % — AB (ref 39.0–52.0)
HEMOGLOBIN: 12.3 g/dL — AB (ref 13.0–17.0)
LYMPHS ABS: 0.6 10*3/uL — AB (ref 0.7–4.0)
LYMPHS PCT: 4 %
MCH: 30.2 pg (ref 26.0–34.0)
MCHC: 33.1 g/dL (ref 30.0–36.0)
MCV: 91.4 fL (ref 78.0–100.0)
MONOS PCT: 3 %
Monocytes Absolute: 0.5 10*3/uL (ref 0.1–1.0)
NEUTROS ABS: 14.2 10*3/uL — AB (ref 1.7–7.7)
NEUTROS PCT: 93 %
Platelets: 246 10*3/uL (ref 150–400)
RBC: 4.07 MIL/uL — AB (ref 4.22–5.81)
RDW: 13.1 % (ref 11.5–15.5)
WBC: 15.3 10*3/uL — AB (ref 4.0–10.5)

## 2016-06-02 LAB — COMPREHENSIVE METABOLIC PANEL
ALBUMIN: 4 g/dL (ref 3.5–5.0)
ALK PHOS: 116 U/L (ref 38–126)
ALT: 28 U/L (ref 17–63)
ANION GAP: 10 (ref 5–15)
AST: 21 U/L (ref 15–41)
BUN: 8 mg/dL (ref 6–20)
CALCIUM: 9.2 mg/dL (ref 8.9–10.3)
CHLORIDE: 100 mmol/L — AB (ref 101–111)
CO2: 26 mmol/L (ref 22–32)
Creatinine, Ser: 0.83 mg/dL (ref 0.61–1.24)
GFR calc Af Amer: >60 mL/min (ref >60)
GFR calc non Af Amer: >60 mL/min (ref >60)
GLUCOSE: 144 mg/dL — AB (ref 65–99)
POTASSIUM: 3.6 mmol/L (ref 3.5–5.1)
SODIUM: 136 mmol/L (ref 135–145)
Total Bilirubin: 0.7 mg/dL (ref 0.3–1.2)
Total Protein: 7.8 g/dL (ref 6.5–8.1)

## 2016-06-02 LAB — TROPONIN I
TROPONIN I: 0.03 ng/mL — AB (ref <0.03)
Troponin I: 0.03 ng/mL (ref <0.03)

## 2016-06-02 LAB — URINALYSIS, ROUTINE W REFLEX MICROSCOPIC
BILIRUBIN URINE: NEGATIVE
Glucose, UA: NEGATIVE mg/dL
Ketones, ur: NEGATIVE mg/dL
Leukocytes, UA: NEGATIVE
Nitrite: NEGATIVE
PH: 6 (ref 5.0–8.0)
Protein, ur: 30 mg/dL — AB
SPECIFIC GRAVITY, URINE: 1.016 (ref 1.005–1.030)

## 2016-06-02 LAB — URINE MICROSCOPIC-ADD ON
BACTERIA UA: NONE SEEN
Squamous Epithelial / LPF: NONE SEEN

## 2016-06-02 LAB — LACTIC ACID, PLASMA: Lactic Acid, Venous: 0.8 mmol/L (ref 0.5–1.9)

## 2016-06-02 LAB — APTT: aPTT: 36 seconds (ref 24–37)

## 2016-06-02 LAB — PROCALCITONIN: Procalcitonin: 0.18 ng/mL

## 2016-06-02 LAB — PROTIME-INR
INR: 1.2 (ref 0.00–1.49)
PROTHROMBIN TIME: 15.3 s — AB (ref 11.6–15.2)

## 2016-06-02 LAB — I-STAT CG4 LACTIC ACID, ED: Lactic Acid, Venous: 1.54 mmol/L (ref 0.5–1.9)

## 2016-06-02 MED ORDER — DOXYCYCLINE HYCLATE 100 MG PO TABS
100.0000 mg | ORAL_TABLET | Freq: Once | ORAL | Status: AC
  Administered 2016-06-02: 100 mg via ORAL
  Filled 2016-06-02: qty 1

## 2016-06-02 MED ORDER — SODIUM CHLORIDE 0.9 % IV BOLUS (SEPSIS)
1000.0000 mL | Freq: Once | INTRAVENOUS | Status: AC
  Administered 2016-06-02: 1000 mL via INTRAVENOUS

## 2016-06-02 MED ORDER — HYDRALAZINE HCL 20 MG/ML IJ SOLN: 10.0000 mg | Freq: Four times a day (QID) | INTRAMUSCULAR | Status: DC | PRN

## 2016-06-02 MED ORDER — ACETAMINOPHEN 325 MG PO TABS
650.0000 mg | ORAL_TABLET | Freq: Once | ORAL | Status: AC
  Administered 2016-06-02: 650 mg via ORAL
  Filled 2016-06-02: qty 2

## 2016-06-02 MED ORDER — OXYCODONE HCL 5 MG PO TABS
15.0000 mg | ORAL_TABLET | Freq: Three times a day (TID) | ORAL | Status: DC | PRN
  Administered 2016-06-02 – 2016-06-03 (×2): 15 mg via ORAL
  Filled 2016-06-02 (×2): qty 3

## 2016-06-02 MED ORDER — ONDANSETRON HCL 4 MG/2ML IJ SOLN
4.0000 mg | Freq: Four times a day (QID) | INTRAMUSCULAR | Status: DC | PRN
  Administered 2016-06-03 – 2016-06-07 (×8): 4 mg via INTRAVENOUS
  Filled 2016-06-02 (×8): qty 2

## 2016-06-02 MED ORDER — SODIUM CHLORIDE 0.9 % IV BOLUS (SEPSIS): 1000.0000 mL | Freq: Once | INTRAVENOUS | Status: DC

## 2016-06-02 MED ORDER — MAGNESIUM SULFATE IN D5W 1-5 GM/100ML-% IV SOLN
1.0000 g | Freq: Once | INTRAVENOUS | Status: AC
  Administered 2016-06-02: 1 g via INTRAVENOUS
  Filled 2016-06-02: qty 100

## 2016-06-02 MED ORDER — DEXTROSE 5 % IV SOLN
1.0000 g | Freq: Once | INTRAVENOUS | Status: AC
  Administered 2016-06-02: 1 g via INTRAVENOUS
  Filled 2016-06-02: qty 10

## 2016-06-02 MED ORDER — OXYCODONE HCL 5 MG PO TABS: 15.0000 mg | ORAL_TABLET | Freq: Four times a day (QID) | ORAL | Status: DC | PRN

## 2016-06-02 MED ORDER — ALBUTEROL SULFATE (2.5 MG/3ML) 0.083% IN NEBU: 2.5000 mg | INHALATION_SOLUTION | RESPIRATORY_TRACT | Status: DC | PRN

## 2016-06-02 MED ORDER — DEXTROSE 5 % IV SOLN
1.0000 g | Freq: Three times a day (TID) | INTRAVENOUS | Status: DC
  Administered 2016-06-02 – 2016-06-03 (×3): 1 g via INTRAVENOUS
  Filled 2016-06-02 (×4): qty 1

## 2016-06-02 MED ORDER — VANCOMYCIN HCL 500 MG IV SOLR
500.0000 mg | Freq: Two times a day (BID) | INTRAVENOUS | Status: DC
  Administered 2016-06-03 – 2016-06-05 (×5): 500 mg via INTRAVENOUS
  Filled 2016-06-02 (×6): qty 500

## 2016-06-02 MED ORDER — ASPIRIN EC 325 MG PO TBEC
325.0000 mg | DELAYED_RELEASE_TABLET | Freq: Every day | ORAL | Status: DC
  Administered 2016-06-03 – 2016-06-08 (×5): 325 mg via ORAL
  Filled 2016-06-02 (×5): qty 1

## 2016-06-02 MED ORDER — SODIUM CHLORIDE 0.9 % IV SOLN
INTRAVENOUS | Status: DC
  Administered 2016-06-02: 23:00:00 via INTRAVENOUS

## 2016-06-02 MED ORDER — SIMVASTATIN 20 MG PO TABS
20.0000 mg | ORAL_TABLET | Freq: Every day | ORAL | Status: DC
  Administered 2016-06-03: 20 mg via ORAL
  Filled 2016-06-02: qty 2
  Filled 2016-06-02 (×2): qty 1

## 2016-06-02 MED ORDER — ENSURE ENLIVE PO LIQD
237.0000 mL | Freq: Three times a day (TID) | ORAL | Status: DC
  Administered 2016-06-02 – 2016-06-08 (×6): 237 mL via ORAL

## 2016-06-02 MED ORDER — AMLODIPINE BESYLATE 10 MG PO TABS
10.0000 mg | ORAL_TABLET | Freq: Every day | ORAL | Status: DC
  Administered 2016-06-03 – 2016-06-08 (×5): 10 mg via ORAL
  Filled 2016-06-02 (×5): qty 1

## 2016-06-02 MED ORDER — ENOXAPARIN SODIUM 40 MG/0.4ML ~~LOC~~ SOLN: 40.0000 mg | SUBCUTANEOUS | Status: DC

## 2016-06-02 MED ORDER — PREGABALIN 75 MG PO CAPS
75.0000 mg | ORAL_CAPSULE | Freq: Every day | ORAL | Status: DC
  Administered 2016-06-03 – 2016-06-05 (×3): 75 mg via ORAL
  Filled 2016-06-02 (×3): qty 1

## 2016-06-02 MED ORDER — SERTRALINE HCL 50 MG PO TABS
100.0000 mg | ORAL_TABLET | Freq: Every day | ORAL | Status: DC
  Administered 2016-06-03 – 2016-06-08 (×5): 100 mg via ORAL
  Filled 2016-06-02 (×5): qty 2

## 2016-06-02 MED ORDER — VANCOMYCIN HCL IN DEXTROSE 1-5 GM/200ML-% IV SOLN
1000.0000 mg | Freq: Once | INTRAVENOUS | Status: AC
  Administered 2016-06-02: 1000 mg via INTRAVENOUS
  Filled 2016-06-02: qty 200

## 2016-06-02 MED ORDER — CARVEDILOL 3.125 MG PO TABS
3.1250 mg | ORAL_TABLET | Freq: Two times a day (BID) | ORAL | Status: DC
  Administered 2016-06-02 – 2016-06-08 (×11): 3.125 mg via ORAL
  Filled 2016-06-02 (×11): qty 1

## 2016-06-02 NOTE — ED Notes (Signed)
He was seen here this Wed. And was rx with Zpack for pneumonia; which he finished yesterday.  He is here today with c/o persistent productive cough and fever.

## 2016-06-02 NOTE — ED Notes (Signed)
Bed: RESB Expected date:  Expected time:  Means of arrival:  Comments: Ems fever low o2

## 2016-06-02 NOTE — ED Provider Notes (Signed)
CSN: [id number]     Arrival date & time 06/02/16  1549 History   First MD Initiated Contact with Patient 06/02/16 1605     Chief Complaint  Patient presents with  . Pneumonia     (Consider location/radiation/quality/duration/timing/severity/associated sxs/prior Treatment) HPI  75 year old male presents with fever and cough. Called EMS because his fever isn't going down. Seen here on 7/18 and diagnosed with pneumonia, now finished a zpack but not improving. Cough with clear/gray sputum. No dyspnea. Continuing to have right lower anterior chest pain. No abdominal pain. Intermittent headaches that go away with medicine. Temp 103.7 here. Has had intermittent diarrhea and constipation since starting antibiotics. No rash, neck pain, or urinary symptoms. Has history of COPD, does not normally use oxygen. Feels diffusely weak/no energy.  Past Medical History  Diagnosis Date  . Hypertension   . Dyslipidemia   . Cerebrovascular disease     right brain CVA 2007  . Lumbago   . Abnormality of gait 03/03/2013  . CVA (cerebral vascular accident) ([place]) 2007    r-cva  . COPD (chronic obstructive pulmonary disease) ([place])   . Cough productive of clear sputum     TX RECENT SINUS INFECTION   . bladder ca dx'd 11/2009    chemo/xrt comp 02/2010   Past Surgical History  Procedure Laterality Date  . Carotid artery angioplasty  2009  . Back surgery  2010  . Bladder cancer    . Laminectomy  09/08/2013    L 2 L3 L4 L5       Dr Luiz Ochoa  . Femoral-femoral bypass graft N/A 12/09/2013    Procedure: BYPASS GRAFT FEMORAL-FEMORAL ARTERY WITH BILATERAL ENDARTERECTOMY ;  Surgeon: Elam Dutch, MD;  Location: [facility] OR;  Service: Vascular;  Laterality: N/A;  . Femoral-popliteal bypass graft Left 12/09/2013    Procedure: BYPASS GRAFT FEMORAL-POPLITEAL ARTERY - LEFT ;  Surgeon: Elam Dutch, MD;  Location: [place];  Service: Vascular;  Laterality: Left;  . Abdominal aortagram N/A 12/07/2013    Procedure: ABDOMINAL  Maxcine Ham;  Surgeon: Angelia Mould, MD;  Location: [facility] CATH LAB;  Service: Cardiovascular;  Laterality: N/A;   Family History  Problem Relation Age of Onset  . Stroke Mother   . Cancer Father   . Dementia Father   . Cancer Sister    Social History  Substance Use Topics  . Smoking status: Former Smoker    Types: Cigarettes    Quit date: 11/12/2005  . Smokeless tobacco: Never Used  . Alcohol Use: No    Review of Systems  Constitutional: Positive for fever.  Respiratory: Positive for cough. Negative for shortness of breath.   Cardiovascular: Positive for chest pain. Negative for leg swelling.  Gastrointestinal: Positive for diarrhea and constipation. Negative for vomiting and abdominal pain.  Musculoskeletal: Negative for neck pain.  Skin: Negative for rash.  Neurological: Positive for weakness and headaches.  All other systems reviewed and are negative.     Allergies  Morphine and related  Home Medications   Prior to Admission medications   Medication Sig Start Date End Date Taking? Authorizing Provider  acetaminophen (TYLENOL) 500 MG tablet Take 1,000 mg by mouth every 6 (six) hours as needed for pain.    Historical Provider, MD  amLODipine (NORVASC) 5 MG tablet Take 10 mg by mouth daily.  02/08/13   Historical Provider, MD  aspirin EC 325 MG tablet Take 325 mg by mouth daily.    Historical Provider, MD  azithromycin [facility]  Z-PAK) 250 MG tablet 2 po day one, then 1 daily x 4 days 05/29/16   Elmyra Ricks Pisciotta, PA-C  cholecalciferol (VITAMIN D) 1000 units tablet Take 1,000 Units by mouth daily.    Historical Provider, MD  collagenase (SANTYL) ointment Apply 1 application topically daily. Patient not taking: Reported on 07/22/2015 03/25/14   Elam Dutch, MD  feeding supplement, ENSURE COMPLETE, (ENSURE COMPLETE) LIQD Take 237 mLs by mouth 3 (three) times daily with meals. 12/11/13   Samantha J Rhyne, PA-C  ferrous sulfate 325 (65 FE) MG EC tablet Take 325 mg by  mouth daily with breakfast.    Historical Provider, MD  lisinopril (PRINIVIL,ZESTRIL) 40 MG tablet Take 40 mg by mouth daily. 05/20/16   Historical Provider, MD  LYRICA 75 MG capsule TAKE ONE CAPSULE EVERY 12 HOURS 07/06/15   Historical Provider, MD  oxyCODONE (ROXICODONE) 15 MG immediate release tablet Take 1 tablet (15 mg total) by mouth every 4 (four) hours as needed for pain. 05/29/16   Nicole Pisciotta, PA-C  potassium chloride SA (K-DUR,KLOR-CON) 20 MEQ tablet Take 1 tablet (20 mEq total) by mouth daily. 05/29/16   Nicole Pisciotta, PA-C  sertraline (ZOLOFT) 100 MG tablet Take 100 mg by mouth daily. 05/27/16   Historical Provider, MD  simvastatin (ZOCOR) 20 MG tablet Take 20 mg by mouth daily. 02/26/13   Historical Provider, MD   BP 160/63 mmHg  Pulse 94  Temp(Src) 103.7 F (39.8 C) (Rectal)  Resp 23  Wt 130 lb (58.968 kg)  SpO2 96% Physical Exam  Constitutional: He is oriented to person, place, and time. He appears well-developed and well-nourished. No distress.  HENT:  Head: Normocephalic and atraumatic.  Right Ear: External ear normal.  Left Ear: External ear normal.  Nose: Nose normal.  Eyes: Right eye exhibits no discharge. Left eye exhibits no discharge.  Neck: Normal range of motion. Neck supple.  No meningismus  Cardiovascular: Normal rate, regular rhythm and intact distal pulses.   Murmur heard. Pulmonary/Chest: Effort normal. No respiratory distress. He has decreased breath sounds. He has rales in the right lower field.  No chest wall tenderness  Abdominal: Soft. There is no tenderness.  Musculoskeletal: He exhibits no edema.  Neurological: He is alert and oriented to person, place, and time.  Skin: Skin is warm. He is diaphoretic.  Nursing note and vitals reviewed.   ED Course  Procedures (including critical care time) Labs Review Labs Reviewed  COMPREHENSIVE METABOLIC PANEL - Abnormal; Notable for the following:    Chloride 100 (*)    Glucose, Bld 144 (*)    All  other components within normal limits  CBC WITH DIFFERENTIAL/PLATELET - Abnormal; Notable for the following:    WBC 15.3 (*)    RBC 4.07 (*)    Hemoglobin 12.3 (*)    HCT 37.2 (*)    Neutro Abs 14.2 (*)    Lymphs Abs 0.6 (*)    All other components within normal limits  URINALYSIS, ROUTINE W REFLEX MICROSCOPIC (NOT AT [facility]) - Abnormal; Notable for the following:    Hgb urine dipstick TRACE (*)    Protein, ur 30 (*)    All other components within normal limits  TROPONIN I - Abnormal; Notable for the following:    Troponin I 0.03 (*)    All other components within normal limits  CULTURE, BLOOD (ROUTINE X 2)  CULTURE, BLOOD (ROUTINE X 2)  URINE CULTURE  CULTURE, EXPECTORATED SPUTUM-ASSESSMENT  GRAM STAIN  C DIFFICILE QUICK SCREEN W  PCR REFLEX  URINE MICROSCOPIC-ADD ON  HIV ANTIBODY (ROUTINE TESTING)  STREP PNEUMONIAE URINARY ANTIGEN  LEGIONELLA PNEUMOPHILA SEROGP 1 UR AG  CBC  BASIC METABOLIC PANEL  I-STAT CG4 LACTIC ACID, ED    Imaging Review Dg Chest 2 View  06/02/2016  CLINICAL DATA:  Fever and cough EXAM: CHEST  2 VIEW COMPARISON:  May 29, 2016 FINDINGS: Persistent lingular opacity. There is also mild opacity in the right base which is unchanged. No other interval changes. IMPRESSION: Persistent infiltrates in the lingula and right base. Recommend follow-up to resolution. Electronically Signed   By: Dorise Bullion III M.D   On: 06/02/2016 16:51   I have personally reviewed and evaluated these images and lab results as part of my medical decision-making.   EKG Interpretation   Date/Time:  Saturday June 02 2016 15:58:05 EDT Ventricular Rate:  92 PR Interval:    QRS Duration: 82 QT Interval:  442 QTC Calculation: 547 R Axis:   83 Text Interpretation:  Sinus rhythm Borderline right axis deviation  Borderline ST depression, diffuse leads Prolonged QT interval Baseline  wander in lead(s) V2 similar to July 18  Confirmed by Regenia Skeeter MD, Ardean Simonich  [phone]) on 06/02/2016  4:19:26 PM      MDM   Final diagnoses:  Bilateral pneumonia    Patient's workup shows continued pneumonia. Not significantly worse, but given he is still coughing and febrile, will treat as failed outpatient PNA. Given rocephin and doxy. Recently had CT, do not think another is needed. I do note a murmur, unclear if new but no prior record of it (and he doesn't know if he's had one). Blood cultures obtained. Will admit to hospitalist.     Sherwood Gambler, MD 06/03/16 1357

## 2016-06-02 NOTE — Progress Notes (Signed)
Pharmacy Antibiotic Note  Thomas Ferrell is a 75 y.o. male previously seen on 7/18 in [facility] ED and treated for CAP.  Now admitted on 06/02/2016 with continued fevers and persistent infiltrate on CXR.  Pharmacy has been consulted for vancomycin and cefepime dosing.  Plan:  Vancomycin 1000 mg IV now, then 500 mg IV q12 hr; goal trough 15-20 mcg/mL  Measure vancomycin trough levels at steady state as indicated  Cefepime 1 g IV q8 hr per MD, dosing appropriate  Follow clinical course, renal function, culture results as available  Follow for de-escalation of antibiotics and LOT    Weight: 130 lb (58.968 kg)  Temp (24hrs), Avg:103.7 F (39.8 C), Min:103.7 F (39.8 C), Max:103.7 F (39.8 C)   Recent Labs Lab 05/29/16 0639 05/29/16 0646 06/02/16 1605 06/02/16 1615  WBC 16.5*  --  15.3*  --   CREATININE 0.75 0.70 0.83  --   LATICACIDVEN  --   --   --  1.54    Estimated Creatinine Clearance: 66.1 mL/min (by C-G formula based on Cr of 0.83).    Allergies  Allergen Reactions  . Morphine And Related Itching    Antimicrobials this admission: Ceftriaxone x 1 + ZPak prior to admit Ceftriaxone + Doxy x 1 in ED Vancomycin 7/22 >>  Cefepime 7/22 >>   Dose adjustments this admission: ---  Microbiology results: 7/22 BCx: sent 7/22 UCx: sent  7/22 MRSA PCR: ordered  Thank you for allowing pharmacy to be a part of this patient's care.  Reuel Boom, PharmD, BCPS Pager: [phone] 06/02/2016, 6:36 PM

## 2016-06-02 NOTE — ED Notes (Signed)
We have just drawn labs, including two blood cultures.  He is in no distress, speaking with Dr. Regenia Skeeter as I write this.

## 2016-06-02 NOTE — H&P (Addendum)
TRH H&P   Patient Demographics:    Kial Anklam, is a 75 y.o. male  MRN: [id number]   DOB - Feb 05, 1941  Admit Date - 06/02/2016  Outpatient Primary MD for the patient is Orpah Melter, MD  Outpatient Specialists: Dr early    Patient coming from:  at home and mobile  Chief Complaint  Patient presents with  . Pneumonia      HPI:    Webster Liera  is a 75 y.o. male,  With history of PAD status post bilateral femoral surgery by Dr. 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 He was placed on azithromycin for 5 days which gave him diarrhea but his main symptoms persisted.  Came to the ER where chest x-ray showed right-sided infiltrate, he had signs of sepsis with temperature of 103 and leukocytosis, I was called to admit the patient for pneumonia causing sepsis. Besides above review of systems patient is stable, he appears nontoxic, denies any chest or abdominal pain. No new focal weakness. Does have diarrhea but no blood or mucus in stool.    Review of systems:    In addition to the HPI above,   No Fever-chills, No Headache, No changes with Vision or hearing, No problems swallowing food or Liquids, No Chest pain, ++ productive Cough & mild Shortness of Breath, No Abdominal pain, No Nausea or Vommitting, +ve diarrhea No Blood in stool or Urine, No  dysuria, No new skin rashes or bruises, No new joints pains-aches,  No new weakness, tingling, numbness in any extremity, No recent weight gain or loss, No polyuria, polydypsia or polyphagia, No significant Mental Stressors.  A full 10 point Review of Systems was done, except as stated above, all other Review of Systems were negative.   With Past History of the following :    Past Medical History  Diagnosis Date  . Hypertension   . Dyslipidemia   . Cerebrovascular disease     right brain CVA 2007  .  Lumbago   . Abnormality of gait 03/03/2013  . CVA (cerebral vascular accident) ([place]) 2007    r-cva  . COPD (chronic obstructive pulmonary disease) ([place])   . Cough productive of clear sputum     TX RECENT SINUS INFECTION   . bladder ca dx'd 11/2009    chemo/xrt comp 02/2010      Past Surgical History  Procedure Laterality Date  . Carotid artery angioplasty  2009  . Back surgery  2010  . Bladder cancer    . Laminectomy  09/08/2013    L 2 L3 L4 L5       Dr Luiz Ochoa  . Femoral-femoral bypass graft N/A 12/09/2013    Procedure: BYPASS GRAFT FEMORAL-FEMORAL ARTERY WITH BILATERAL ENDARTERECTOMY ;  Surgeon: Elam Dutch, MD;  Location: [facility] OR;  Service: Vascular;  Laterality: N/A;  . Femoral-popliteal bypass graft Left 12/09/2013    Procedure: BYPASS GRAFT FEMORAL-POPLITEAL ARTERY - LEFT ;  Surgeon: Elam Dutch, MD;  Location: [place];  Service: Vascular;  Laterality: Left;  . Abdominal aortagram N/A 12/07/2013    Procedure: ABDOMINAL Maxcine Ham;  Surgeon: Angelia Mould, MD;  Location: [facility] CATH LAB;  Service: Cardiovascular;  Laterality: N/A;      Social History:     Social History  Substance Use Topics  . Smoking status: Former Smoker    Types: Cigarettes    Quit date: 11/12/2005  . Smokeless tobacco: Never Used  . Alcohol Use: No     Lives - at home and mobile      Family History :     Family History  Problem Relation Age of Onset  . Stroke Mother   .  Cancer Father   . Dementia Father   . Cancer Sister        Home Medications:   Prior to Admission medications   Medication Sig Start Date End Date Taking? Authorizing Provider  acetaminophen (TYLENOL) 500 MG tablet Take 1,000 mg by mouth every 6 (six) hours as needed for pain.    Historical Provider, MD  amLODipine (NORVASC) 5 MG tablet Take 10 mg by mouth daily.  02/08/13   Historical Provider, MD  aspirin EC 325 MG tablet Take 325 mg by mouth daily.    Historical Provider, MD  azithromycin (ZITHROMAX Z-PAK) 250 MG tablet 2 po day one, then 1 daily x 4 days 05/29/16   Elmyra Ricks Pisciotta, PA-C  cholecalciferol (VITAMIN D) 1000 units tablet Take 1,000 Units by mouth daily.    Historical Provider, MD  collagenase (SANTYL) ointment Apply 1 application topically daily. Patient not taking: Reported on 07/22/2015 03/25/14   Elam Dutch, MD  feeding supplement, ENSURE COMPLETE, (ENSURE COMPLETE) LIQD Take 237 mLs by mouth 3 (three) times daily with meals. 12/11/13   Samantha J Rhyne, PA-C  ferrous sulfate 325 (65 FE) MG EC tablet Take 325 mg by mouth daily with breakfast.    Historical Provider, MD  lisinopril (PRINIVIL,ZESTRIL) 40 MG tablet Take 40 mg by mouth daily. 05/20/16   Historical Provider, MD  LYRICA 75 MG capsule TAKE ONE CAPSULE EVERY 12 HOURS 07/06/15   Historical Provider, MD  oxyCODONE (ROXICODONE) 15 MG immediate release tablet Take 1 tablet (15 mg total) by mouth every 4 (four) hours as needed for pain. 05/29/16   Nicole Pisciotta, PA-C  potassium chloride SA (K-DUR,KLOR-CON) 20 MEQ tablet Take 1 tablet (20 mEq total) by mouth daily. 05/29/16   Nicole Pisciotta, PA-C  sertraline (ZOLOFT) 100 MG tablet Take 100 mg  by mouth daily. 05/27/16   Historical Provider, MD  simvastatin (ZOCOR) 20 MG tablet Take 20 mg by mouth daily. 02/26/13   Historical Provider, MD     Allergies:     Allergies  Allergen Reactions  . Morphine And Related Itching    Lasts about 1 week beyond use.  Itching to  entire body.     Physical Exam:   Vitals  Blood pressure 151/59, pulse 78, temperature 103.7 F (39.8 C), temperature source Rectal, resp. rate 23, weight 58.968 kg (130 lb), SpO2 93 %.   1. General frail elderly white male lying in bed in NAD,     2. Normal affect and insight, Not Suicidal or Homicidal, Awake Alert, Oriented X 3.  3. No F.N deficits, ALL C.Nerves Intact, Strength 5/5 all 4 extremities, Sensation intact all 4 extremities, Plantars down going.  4. Ears and Eyes appear Normal, Conjunctivae clear, PERRLA. Moist Oral Mucosa.  5. Supple Neck, No JVD, No cervical lymphadenopathy appriciated, No Carotid Bruits.  6. Symmetrical Chest wall movement, Good air movement bilaterally, few rales R>L  7. RRR, No Gallops, Rubs or Murmurs, No Parasternal Heave.  8. Positive Bowel Sounds, Abdomen Soft, No tenderness, No organomegaly appriciated,No rebound -guarding or rigidity.  9.  No Cyanosis, Normal Skin Turgor, No Skin Rash or Bruise.  10. Good muscle tone,  joints appear normal , no effusions, Normal ROM.  11. No Palpable Lymph Nodes in Neck or Axillae      Data Review:    CBC  Recent Labs Lab 05/29/16 0639 05/29/16 0646 06/02/16 1605  WBC 16.5*  --  15.3*  HGB 13.6 14.6 12.3*  HCT 40.5 43.0 37.2*  PLT 272  --  246  MCV 91.8  --  91.4  MCH 30.8  --  30.2  MCHC 33.6  --  33.1  RDW 13.0  --  13.1  LYMPHSABS  --   --  0.6*  MONOABS  --   --  0.5  EOSABS  --   --  0.0  BASOSABS  --   --  0.0   ------------------------------------------------------------------------------------------------------------------  Chemistries   Recent Labs Lab 05/29/16 0639 05/29/16 0646 06/02/16 1605  NA 138 140 136  K 3.1* 3.1* 3.6  CL 101 100* 100*  CO2 25  --  26  GLUCOSE 161* 158* 144*  BUN 9 8 8   CREATININE 0.75 0.70 0.83  CALCIUM 9.2  --  9.2  MG 1.8  --   --   AST 26  --  21  ALT 21  --  28  ALKPHOS 115  --  116  BILITOT 0.4  --  0.7    ------------------------------------------------------------------------------------------------------------------ estimated creatinine clearance is 66.1 mL/min (by C-G formula based on Cr of 0.83). ------------------------------------------------------------------------------------------------------------------ No results for input(s): TSH, T4TOTAL, T3FREE, THYROIDAB in the last 72 hours.  Invalid input(s): FREET3  Coagulation profile No results for input(s): INR, PROTIME in the last 168 hours. ------------------------------------------------------------------------------------------------------------------- No results for input(s): DDIMER in the last 72 hours. -------------------------------------------------------------------------------------------------------------------  Cardiac Enzymes  Recent Labs Lab 06/02/16 1605  TROPONINI 0.03*   ------------------------------------------------------------------------------------------------------------------    Component Value Date/Time   BNP 92.3 05/29/2016 0639     ---------------------------------------------------------------------------------------------------------------  Urinalysis    Component Value Date/Time   COLORURINE YELLOW 06/02/2016 1710   APPEARANCEUR CLEAR 06/02/2016 1710   LABSPEC 1.016 06/02/2016 1710   PHURINE 6.0 06/02/2016 1710   GLUCOSEU NEGATIVE 06/02/2016 1710   HGBUR TRACE* 06/02/2016 1710   BILIRUBINUR NEGATIVE 06/02/2016 1710  KETONESUR NEGATIVE 06/02/2016 1710   PROTEINUR 30* 06/02/2016 1710   UROBILINOGEN 0.2 12/22/2013 1307   NITRITE NEGATIVE 06/02/2016 1710   LEUKOCYTESUR NEGATIVE 06/02/2016 1710    ----------------------------------------------------------------------------------------------------------------   Imaging Results:    Dg Chest 2 View  06/02/2016  CLINICAL DATA:  Fever and cough EXAM: CHEST  2 VIEW COMPARISON:  May 29, 2016 FINDINGS: Persistent lingular opacity. There  is also mild opacity in the right base which is unchanged. No other interval changes. IMPRESSION: Persistent infiltrates in the lingula and right base. Recommend follow-up to resolution. Electronically Signed   By: Dorise Bullion III M.D   On: 06/02/2016 16:51    My personal review of EKG: Rhythm NSR, QTc 584ms   Assessment & Plan:    1. Sepsis due to community-acquired pneumonia which has failed azithromycin. Has persistent infiltrate in the right lower lobe, denies any episodes of choking or problems with swallowing food or liquids. However wife says that he does cough from time to time while eating. He will be admitted to a telemetry bed, sepsis protocol initiated, blood and sputum culture, strep pneumonia and Legionella antigen to be checked. Since he has failed azithromycin will be placed on vancomycin and cefepime. Monitor cultures. Supportive care with oxygen and nebulizer treatments.  2. COPD. Stable no wheezing supportive care.  3. Prolonged QTC. Coreg, IV magnesium and telemetry monitor.  4. Chronic low back pain. Status post multiple surgeries, peripheral neuropathy with left big toe drop. Continue supportive care, home pain regimen, brace for the left toe.  5. Stroke. With poor baseline balance. Cane when ambulates.  6. Few coughing episodes while swallowing. Speech eval.  7. Dyslipidemia. On statin.  8. Essential hypertension. Place on low-dose Coreg and as needed hydralazine, hold ACE inhibitor due to sepsis.  9. Azithromycin induced diarrhea - R/O C diff then imodium PRN.  10. Sepsis induced mild Trop leak - B blocker, ASA, pain free, EKG non acute, Tren trop.  11. ? Murmur - appreciated by ER MD - i could not, TTE .  DVT Prophylaxis   Lovenox    AM Labs Ordered, also please review Full Orders  Family Communication: Admission, patients condition and plan of care including tests being ordered have been discussed with the patient and wife who indicate understanding and  agree with the plan and Code Status.  Code Status Full  Likely DC to  Home 2-3 days  Condition GUARDED     Consults called: None    Admission status: Inpatient    Time spent in minutes : 35   Lala Lund K M.D on 06/02/2016 at 6:03 PM  Between 7am to 7pm - Pager - [phone]. After 7pm go to [email or web address] - password [facility]  [facility] - Office  [phone]

## 2016-06-02 NOTE — Progress Notes (Signed)
RT arrived to ED Resp B to administer Flutter device as ordered. RN states that PT has transferred to [place].

## 2016-06-03 ENCOUNTER — Inpatient Hospital Stay (HOSPITAL_COMMUNITY): Payer: Medicare Other

## 2016-06-03 ENCOUNTER — Encounter (HOSPITAL_COMMUNITY): Payer: Self-pay | Admitting: *Deleted

## 2016-06-03 DIAGNOSIS — I509 Heart failure, unspecified: Secondary | ICD-10-CM

## 2016-06-03 LAB — TROPONIN I: TROPONIN I: 0.03 ng/mL — AB (ref <0.03)

## 2016-06-03 LAB — CULTURE, BLOOD (ROUTINE X 2)
CULTURE: NO GROWTH
CULTURE: NO GROWTH

## 2016-06-03 LAB — BASIC METABOLIC PANEL
Anion gap: 10 (ref 5–15)
BUN: 9 mg/dL (ref 6–20)
CHLORIDE: 102 mmol/L (ref 101–111)
CO2: 23 mmol/L (ref 22–32)
Calcium: 8.5 mg/dL — ABNORMAL LOW (ref 8.9–10.3)
Creatinine, Ser: 0.76 mg/dL (ref 0.61–1.24)
GFR calc Af Amer: >60 mL/min (ref >60)
GLUCOSE: 164 mg/dL — AB (ref 65–99)
POTASSIUM: 3.5 mmol/L (ref 3.5–5.1)
Sodium: 135 mmol/L (ref 135–145)

## 2016-06-03 LAB — CBC
HCT: 34.4 % — ABNORMAL LOW (ref 39.0–52.0)
Hemoglobin: 11.4 g/dL — ABNORMAL LOW (ref 13.0–17.0)
MCH: 30.3 pg (ref 26.0–34.0)
MCHC: 33.1 g/dL (ref 30.0–36.0)
MCV: 91.5 fL (ref 78.0–100.0)
PLATELETS: 214 10*3/uL (ref 150–400)
RBC: 3.76 MIL/uL — AB (ref 4.22–5.81)
RDW: 13.3 % (ref 11.5–15.5)
WBC: 15.2 10*3/uL — ABNORMAL HIGH (ref 4.0–10.5)

## 2016-06-03 LAB — HEPATIC FUNCTION PANEL
ALT: 271 U/L — AB (ref 17–63)
AST: 339 U/L — ABNORMAL HIGH (ref 15–41)
Albumin: 3.2 g/dL — ABNORMAL LOW (ref 3.5–5.0)
Alkaline Phosphatase: 292 U/L — ABNORMAL HIGH (ref 38–126)
BILIRUBIN INDIRECT: 1.5 mg/dL — AB (ref 0.3–0.9)
Bilirubin, Direct: 2.9 mg/dL — ABNORMAL HIGH (ref 0.1–0.5)
TOTAL PROTEIN: 6.8 g/dL (ref 6.5–8.1)
Total Bilirubin: 4.4 mg/dL — ABNORMAL HIGH (ref 0.3–1.2)

## 2016-06-03 LAB — HIV ANTIBODY (ROUTINE TESTING W REFLEX): HIV Screen 4th Generation wRfx: NONREACTIVE

## 2016-06-03 LAB — PROTIME-INR
INR: 1.46 (ref 0.00–1.49)
Prothrombin Time: 17.8 seconds — ABNORMAL HIGH (ref 11.6–15.2)

## 2016-06-03 LAB — ECHOCARDIOGRAM COMPLETE: WEIGHTICAEL: 2080 [oz_av]

## 2016-06-03 LAB — URINE CULTURE: CULTURE: NO GROWTH

## 2016-06-03 LAB — LACTIC ACID, PLASMA: Lactic Acid, Venous: 0.8 mmol/L (ref 0.5–1.9)

## 2016-06-03 MED ORDER — DIATRIZOATE MEGLUMINE & SODIUM 66-10 % PO SOLN
30.0000 mL | Freq: Once | ORAL | Status: DC
  Filled 2016-06-03: qty 30

## 2016-06-03 MED ORDER — GADOBENATE DIMEGLUMINE 529 MG/ML IV SOLN
12.0000 mL | Freq: Once | INTRAVENOUS | Status: AC | PRN
  Administered 2016-06-03: 12 mL via INTRAVENOUS

## 2016-06-03 MED ORDER — PIPERACILLIN-TAZOBACTAM 3.375 G IVPB
3.3750 g | Freq: Three times a day (TID) | INTRAVENOUS | Status: DC
  Administered 2016-06-03 – 2016-06-08 (×13): 3.375 g via INTRAVENOUS
  Filled 2016-06-03 (×17): qty 50

## 2016-06-03 MED ORDER — LORAZEPAM 2 MG/ML IJ SOLN: 1.0000 mg | Freq: Once | INTRAMUSCULAR | Status: DC | PRN

## 2016-06-03 MED ORDER — PIPERACILLIN-TAZOBACTAM 3.375 G IVPB 30 MIN
3.3750 g | Freq: Once | INTRAVENOUS | Status: AC
  Administered 2016-06-03: 3.375 g via INTRAVENOUS
  Filled 2016-06-03: qty 50

## 2016-06-03 MED ORDER — ENOXAPARIN SODIUM 40 MG/0.4ML ~~LOC~~ SOLN
40.0000 mg | Freq: Every day | SUBCUTANEOUS | Status: DC
  Administered 2016-06-03 – 2016-06-08 (×4): 40 mg via SUBCUTANEOUS
  Filled 2016-06-03 (×4): qty 0.4

## 2016-06-03 MED ORDER — DOXYCYCLINE HYCLATE 100 MG PO TABS
100.0000 mg | ORAL_TABLET | Freq: Every day | ORAL | Status: DC
  Administered 2016-06-03: 100 mg via ORAL
  Filled 2016-06-03: qty 1

## 2016-06-03 MED ORDER — OXYCODONE HCL 5 MG PO TABS
15.0000 mg | ORAL_TABLET | ORAL | Status: DC | PRN
  Administered 2016-06-03 – 2016-06-08 (×4): 15 mg via ORAL
  Filled 2016-06-03 (×5): qty 3

## 2016-06-03 MED ORDER — IOPAMIDOL (ISOVUE-300) INJECTION 61%
100.0000 mL | Freq: Once | INTRAVENOUS | Status: AC | PRN
  Administered 2016-06-03: 100 mL via INTRAVENOUS

## 2016-06-03 MED ORDER — GUAIFENESIN-DM 100-10 MG/5ML PO SYRP
5.0000 mL | ORAL_SOLUTION | ORAL | Status: DC | PRN
  Administered 2016-06-04: 5 mL via ORAL
  Filled 2016-06-03: qty 10

## 2016-06-03 MED ORDER — ACETAMINOPHEN 325 MG PO TABS
650.0000 mg | ORAL_TABLET | Freq: Four times a day (QID) | ORAL | Status: DC | PRN
  Administered 2016-06-03: 650 mg via ORAL
  Filled 2016-06-03: qty 2

## 2016-06-03 MED ORDER — DOXYCYCLINE HYCLATE 100 MG PO TABS
100.0000 mg | ORAL_TABLET | Freq: Two times a day (BID) | ORAL | Status: DC
  Administered 2016-06-03 – 2016-06-05 (×5): 100 mg via ORAL
  Filled 2016-06-03 (×5): qty 1

## 2016-06-03 MED ORDER — SODIUM CHLORIDE 0.9 % IV SOLN
INTRAVENOUS | Status: DC
  Administered 2016-06-03: 17:00:00 via INTRAVENOUS

## 2016-06-03 NOTE — Progress Notes (Signed)
*  PRELIMINARY RESULTS* Echocardiogram 2D Echocardiogram has been performed.  Leavy Cella 06/03/2016, 1:58 PM

## 2016-06-03 NOTE — Progress Notes (Addendum)
PROGRESS NOTE                                                                                                                                                                                                             Patient Demographics:    Thomas Ferrell, is a 75 y.o. male, DOB - Nov 26, 1940, PM:[id number]  Admit date - 06/02/2016   Admitting Physician Thurnell Lose, MD  Outpatient Primary MD for the patient is Orpah Melter, MD  LOS - 1  Chief Complaint  Patient presents with  . Pneumonia       Brief Narrative     Thomas Ferrell  is a 75 y.o. male,  With history of PAD status post bilateral femoral surgery by Dr. 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 He was placed on azithromycin for 5 days which gave him diarrhea but his main symptoms persisted.  Came to the ER where chest x-ray showed right-sided infiltrate, he had signs of sepsis with temperature of 103 and leukocytosis, I was called to admit the patient for pneumonia causing sepsis. Besides above review of systems patient is stable, he appears nontoxic, denies any chest or abdominal pain. No new focal weakness. Does have diarrhea but no blood or mucus in stool.   Subjective:    Thomas Ferrell today has, No headache, he complains today of chronic upper Abd/back pain says ongoing for weeks, he forgot to mention it to me upon admission - No Nausea, No new weakness tingling or numbness, No Cough - SOB.     Assessment  & Plan :      1. Sepsis due to community-acquired pneumonia which has failed  azithromycin. Has persistent infiltrate in the right lower lobe, denies any episodes of choking or problems with swallowing food or liquids. However wife says that he does cough from time to time while eating. He will be admitted to a telemetry bed, sepsis protocol initiated, blood and sputum culture, strep pneumonia and Legionella antigen  to be checked. Since he has failed azithromycin will be placed on Doxy, Vancomycin and Cefepime. Monitor cultures. Supportive care with oxygen and nebulizer treatments. Flutter valve added and requested to sit in chair.  2. COPD. Stable no wheezing supportive care.  3. Prolonged QTC. Coreg, IV magnesium and telemetry monitor. Repeat EKG.  4. Chronic low back pain, now vague Abd pain x 2 weeks. Status post multiple surgeries, peripheral neuropathy with left big toe drop. Continue supportive care, home pain regimen adjusted upon his request, brace for the left toe. Check CT Abd - pelvis.  Addendum - called by radiology at 3.40pm, ? CBD obstruction, his liver enzymes were NML upon admission, Called GI Dr Ardis Hughs, MRI & MRCP ABD ordered, will broaden ABX, NPO except Meds.  5. Stroke. With poor baseline balance. Cane when ambulates.  6. Few coughing episodes while swallowing. Speech eval pending.  7. Dyslipidemia. On statin.  8. Essential hypertension. Place on low-dose Coreg and as needed hydralazine, hold ACE inhibitor due to sepsis.  9. Azithromycin induced diarrhea - resolved, no BM in > 14hrs after admission.  10. Sepsis induced mild Trop leak - B blocker, ASA, pain free, EKG non acute, flat Troponin trend.  11. ? Murmur - appreciated by ER MD - i could not, TTE pending .  12. Leukocytosis - somewhat chronic for at least 3-4 months, follow up with PCP    Family Communication  :  Wife  Code Status :  Full  Diet : Heart Healthy  Disposition Plan  :  Stay inpt  Consults  :  None  Procedures  :    CT chest - lung nodule +  R.infilterate  TTE  CT Abd - Pelvis   DVT Prophylaxis  :  Lovenox   Lab Results  Component Value Date   PLT 214 06/03/2016    Inpatient Medications  Scheduled Meds: . amLODipine  10 mg Oral Daily  . aspirin EC  325 mg Oral Daily  . carvedilol  3.125 mg Oral BID WC  . ceFEPime (MAXIPIME) IV  1 g Intravenous Q8H  . doxycycline  100 mg Oral Daily  . feeding supplement (ENSURE ENLIVE)  237 mL Oral TID WC  . pregabalin  75 mg Oral Daily  . sertraline  100 mg Oral Daily  . simvastatin  20 mg Oral Daily  . sodium chloride  1,000 mL Intravenous Once  . vancomycin  500 mg Intravenous Q12H   Continuous Infusions: . sodium chloride     PRN Meds:.albuterol, hydrALAZINE, ondansetron (ZOFRAN) IV, oxyCODONE  Antibiotics  :    Anti-infectives    Start     Dose/Rate Route Frequency Ordered Stop   06/03/16 1045  doxycycline (VIBRA-TABS) tablet 100 mg     100 mg Oral Daily 06/03/16 1036     06/03/16 0600  vancomycin (VANCOCIN) 500 mg in sodium chloride 0.9 % 100 mL IVPB     500 mg 100 mL/hr over 60 Minutes Intravenous Every 12 hours 06/02/16 1826     06/02/16 2000  ceFEPIme (MAXIPIME) 1 g in dextrose 5 % 50 mL IVPB     1 g 100 mL/hr over 30 Minutes Intravenous Every 8 hours 06/02/16 1747 06/10/16 2159   06/02/16 1830  vancomycin (VANCOCIN) IVPB 1000 mg/200 mL premix     1,000 mg 200 mL/hr over 60 Minutes Intravenous  Once 06/02/16 1816 06/03/16 0020   06/02/16 1630  cefTRIAXone (ROCEPHIN) 1 g in dextrose 5 % 50 mL IVPB  1 g 100 mL/hr over 30 Minutes Intravenous  Once 06/02/16 1622 06/02/16 1700   06/02/16 1630  doxycycline (VIBRA-TABS) tablet 100 mg     100 mg Oral  Once 06/02/16 1622 06/02/16 1640         Objective:   Vitals:   06/02/16 2132 06/03/16 0558 06/03/16 0826 06/03/16 1020  BP: (!) 150/62 (!) 173/64 132/62 (!) 151/63  Pulse: 68 87 76 83  Resp: 19 18 18    Temp: 98.6 F (37 C) 100.1 F (37.8 C) 99.6 F (37.6 C)   TempSrc: Oral Oral Oral   SpO2: 98% 94%  91% 93%  Weight:        Wt Readings from Last 3 Encounters:  06/02/16 59 kg (130 lb)  05/29/16 59 kg (130 lb)  02/02/16 56.7 kg (125 lb)     Intake/Output Summary (Last 24 hours) at 06/03/16 1038 Last data filed at 06/03/16 1000  Gross per 24 hour  Intake              120 ml  Output             1100 ml  Net             -980 ml     Physical Exam  Awake Alert, Oriented X 3, No new F.N deficits, Normal affect [place].AT,PERRAL Supple Neck,No JVD, No cervical lymphadenopathy appriciated.  Symmetrical Chest wall movement, Good air movement bilaterally, CTAB RRR,No Gallops,Rubs or new Murmurs, No Parasternal Heave +ve B.Sounds, Abd Soft, No tenderness, No organomegaly appriciated, No rebound - guarding or rigidity. No Cyanosis, Clubbing or edema, No new Rash or bruise      Data Review:    CBC  Recent Labs Lab 05/29/16 0639 05/29/16 0646 06/02/16 1605 06/03/16 0115  WBC 16.5*  --  15.3* 15.2*  HGB 13.6 14.6 12.3* 11.4*  HCT 40.5 43.0 37.2* 34.4*  PLT 272  --  246 214  MCV 91.8  --  91.4 91.5  MCH 30.8  --  30.2 30.3  MCHC 33.6  --  33.1 33.1  RDW 13.0  --  13.1 13.3  LYMPHSABS  --   --  0.6*  --   MONOABS  --   --  0.5  --   EOSABS  --   --  0.0  --   BASOSABS  --   --  0.0  --     Chemistries   Recent Labs Lab 05/29/16 0639 05/29/16 0646 06/02/16 1605 06/03/16 0115  NA 138 140 136 135  K 3.1* 3.1* 3.6 3.5  CL 101 100* 100* 102  CO2 25  --  26 23  GLUCOSE 161* 158* 144* 164*  BUN 9 8 8 9   CREATININE 0.75 0.70 0.83 0.76  CALCIUM 9.2  --  9.2 8.5*  MG 1.8  --   --   --   AST 26  --  21  --   ALT 21  --  28  --   ALKPHOS 115  --  116  --   BILITOT 0.4  --  0.7  --    ------------------------------------------------------------------------------------------------------------------ No results for input(s): CHOL, HDL, LDLCALC, TRIG, CHOLHDL, LDLDIRECT in the last 72 hours.  Lab Results  Component Value Date   HGBA1C  01/30/2011    5.6 (NOTE)  According to the ADA Clinical Practice Recommendations for 2011, when HbA1c is used as a screening test:   >=6.5%   Diagnostic of Diabetes Mellitus           (if abnormal result  is confirmed)  5.7-6.4%   Increased risk of developing Diabetes Mellitus  References:Diagnosis and Classification of Diabetes Mellitus,Diabetes Care,2011,34(Suppl 1):S62-S69 and Standards of Medical Care in         Diabetes - 2011,Diabetes [id number]  (Suppl 1):S11-S61.   ------------------------------------------------------------------------------------------------------------------ No results for input(s): TSH, T4TOTAL, T3FREE, THYROIDAB in the last 72 hours.  Invalid input(s): FREET3 ------------------------------------------------------------------------------------------------------------------ No results for input(s): VITAMINB12, FOLATE, FERRITIN, TIBC, IRON, RETICCTPCT in the last 72 hours.  Coagulation profile  Recent Labs Lab 06/02/16 1815  INR 1.20    No results for input(s): DDIMER in the last 72 hours.  Cardiac Enzymes  Recent Labs Lab 06/02/16 1605 06/02/16 1935 06/03/16 0115  TROPONINI 0.03* 0.03* 0.03*   ------------------------------------------------------------------------------------------------------------------    Component Value Date/Time   BNP 92.3 05/29/2016 [id number]    Micro Results Recent Results (from the past 240 hour(s))  Blood culture (routine x 2)     Status: None (Preliminary result)   Collection Time: 05/29/16  8:50 AM  Result Value Ref Range Status   Specimen Description BLOOD LEFT ANTECUBITAL  Final   Special Requests BOTTLES DRAWN AEROBIC AND ANAEROBIC 5CC  Final   Culture   Final    NO GROWTH 4 DAYS Performed at [facility]    Report Status PENDING  Incomplete  Blood culture (routine x 2)     Status: None (Preliminary result)   Collection Time: 05/29/16  8:54 AM  Result Value Ref Range  Status   Specimen Description BLOOD RIGHT ANTECUBITAL  Final   Special Requests BOTTLES DRAWN AEROBIC AND ANAEROBIC 5ML  Final   Culture   Final    NO GROWTH 4 DAYS Performed at [facility]    Report Status PENDING  Incomplete    Radiology Reports Dg Chest 2 View  Result Date: 06/02/2016 CLINICAL DATA:  Fever and cough EXAM: CHEST  2 VIEW COMPARISON:  May 29, 2016 FINDINGS: Persistent lingular opacity. There is also mild opacity in the right base which is unchanged. No other interval changes. IMPRESSION: Persistent infiltrates in the lingula and right base. Recommend follow-up to resolution. Electronically Signed   By: Dorise Bullion III M.D   On: 06/02/2016 16:51   Dg Chest 2 View  Result Date: 05/29/2016 CLINICAL DATA:  Chest pain EXAM: CHEST  2 VIEW COMPARISON:  12/22/2013 FINDINGS: There is a new rounded opacity near the left hilum with neighboring atelectasis or scar. There is no edema, consolidation, effusion, or pneumothorax. Chronic bronchitic markings. Normal heart size and aortic contours. Aortic atherosclerosis. IMPRESSION: 1. Left perihilar nodule.  Recommend chest CT. 2. History of COPD.  Stable bronchitic markings. Electronically Signed   By: Monte Fantasia M.D.   On: 05/29/2016 06:38   Ct Angio Chest Aorta W/cm &/or Wo/cm  Result Date: 05/29/2016 CLINICAL DATA:  Chest pain starting 3 a.m., nausea, abnormal chest x-ray, history of bladder cancer EXAM: CT ANGIOGRAPHY CHEST without and WITH CONTRAST TECHNIQUE: Multidetector CT imaging of the chest was performed using the standard protocol during bolus administration of intravenous contrast. Multiplanar CT image reconstructions and MIPs were obtained to evaluate the vascular anatomy. Unenhanced images of the chest were provided. CONTRAST:  100 cc Isovue COMPARISON:  CT chest 03/21/2010 FINDINGS: Cardiovascular: Unenhanced images shows atherosclerotic calcifications of thoracic  aorta and coronary arteries. Atherosclerotic  calcifications are noted upper abdominal aorta, SMA origin, os celiac trunk origin and bilateral renal artery origin. Heart size within normal limits. No pericardial effusion. Arterial phase enhanced images demonstrates no evidence of aortic aneurysm or aortic dissection. The study is of excellent technical quality. No pulmonary embolus is noted. Pulmonary veins are unremarkable. Mediastinum/Nodes: There is no mediastinal hematoma or adenopathy. No hilar adenopathy is noted. Lungs/Pleura: Images of the lung parenchyma shows bilateral emphysematous changes especially in upper lobes. As noted on the chest x-ray there is triangular shape consolidation in lingula anteromedial with some air bronchogram. Some mucus bronchial plugging is noted in axial image 44. Findings are highly suspicious for small focal pneumonia. Additional focal infiltrate/ pneumonia noted in right middle lobe medially and laterally please see axial image 70. Axial image 69 there is 6 mm nodule in left base/left lower lobe anteriorly. Upper Abdomen: The visualized upper abdomen shows stable thickening bilateral adrenal glands left greater than right. There is fatty infiltration of the liver. No calcified gallstones are noted within visualize gallbladder. Visualized pancreas and spleen is unremarkable. Visualized upper kidneys are unremarkable. Musculoskeletal: Sagittal images of the spine shows degenerative changes thoracic spine. Sagittal view of the sternum is unremarkable. Review of the MIP images confirms the above findings. IMPRESSION: 1. No aortic aneurysm or aortic dissection.  No pulmonary embolus. 2. Atherosclerotic calcifications of thoracic aorta, abdominal aorta, celiac trunk and SMA origin, bilateral renal artery origin. Atherosclerotic calcifications of coronary arteries. 3. No mediastinal hematoma or adenopathy. There is small somewhat triangular-shaped consolidation in lingula anteromedially with some air bronchogram. Findings highly  suspicious for focal pneumonia. Additional focal infiltrate/pneumonia noted in right middle lobe laterally and medially. Follow-up to resolution after treatment is recommended. 4. There is 6 mm nodule in left base anteriorly/ left lower lobe. Non-contrast chest CT at 6-12 months is recommended. If the nodule is stable at time of repeat CT, then future CT at 18-24 months (from today's scan) is considered optional for low-risk patients, but is recommended for high-risk patients. This recommendation follows the consensus statement: Guidelines for Management of Incidental Pulmonary Nodules Detected on CT Images:From the Fleischner Society 2017; published online before print (10.1148/radiol.SG:5268862). 5. Bilateral emphysematous changes are noted. 6. Fatty infiltration of the liver. Stable bilateral thickening of adrenal glands. Electronically Signed   By: Lahoma Crocker M.D.   On: 05/29/2016 08:07    Time Spent in minutes  30   SINGH,PRASHANT K M.D on 06/03/2016 at 10:38 AM  Between 7am to 7pm - Pager - [phone]  After 7pm go to [email or web address] - password [facility]  [facility] -  Office  [phone]

## 2016-06-03 NOTE — Evaluation (Signed)
Physical Therapy Evaluation Patient Details Name: Thomas Ferrell MRN: [id number] DOB: [birth year] Today's Date: 06/03/2016   History of Present Illness  Thomas Ferrell  is a 75 y.o. male,  With history of PAD status post bilateral femoral surgery by Dr. 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 Patient found to have CAP.  Clinical Impression  The  Patient  Appeared  Confused, required much extra time to get up and walk 6'. He appeared to rely on wife to decide for him about trying to ambulate.  The patient is hom,e alone per wife.   Recommend 24/7  Assistance. Pt admitted with above diagnosis. Pt currently with functional limitations due to the deficits listed below (see PT Problem List).  Pt will benefit from skilled PT to increase their independence and safety with mobility to allow discharge to the venue listed below.       Follow Up Recommendations Home health PT;Supervision/Assistance - 24 hour (wife states that she can stay out of work, patient will need 24/7 currently, Suggested SNF but neither patient nor wife seemed interested.)    Equipment Recommendations  None recommended by PT    Recommendations for Other Services       Precautions / Restrictions Precautions Precautions: Fall Required Braces or Orthoses: Other Brace/Splint Other Brace/Splint: has and AFO for L foot, very painful Left foot due to neuropathy      Mobility  Bed Mobility Overal bed mobility: Needs Assistance Bed Mobility: Sit to Supine       Sit to supine: Supervision   General bed mobility comments: patient keeps the Left  kne flexed  Transfers Overall transfer level: Needs assistance Equipment used: Rolling  walker (2 wheeled) Transfers: Sit to/from Stand Sit to Stand: Min assist;+2 safety/equipment         General transfer comment: much extra time to follow throuigh with the activity, he would start to rise, then stop. Required lifting assist to stand up from recliner as patient did not put weight on the  left foot, he held in the air.  Ambulation/Gait Ambulation/Gait assistance: +2 safety/equipment Ambulation Distance (Feet): 6 Feet Assistive device: Rolling walker (2 wheeled) Gait Pattern/deviations: Step-to pattern;Decreased step length - left;Decreased stance time - left;Trunk flexed;Antalgic     General Gait Details: the  patient required much extra time, On 2 liters with sats 100%. patien slowly ambulated 6 feet then backed up to the bed. Initially did not put weight on the left foot, gradually increased the weight.  Stairs            Wheelchair Mobility    Modified Rankin (Stroke Patients Only)       Balance Overall balance assessment: Needs assistance Sitting-balance support: Bilateral upper extremity supported;Feet supported Sitting balance-Leahy Scale: Fair     Standing balance support: Bilateral upper extremity supported;During functional activity Standing balance-Leahy Scale: Poor Standing balance comment: keeps L foot off of the floor                             Pertinent Vitals/Pain Pain Assessment: Faces Faces Pain Scale: Hurts whole lot Pain Location: L foot, abdomen Pain Descriptors / Indicators: Discomfort;Grimacing;Guarding Pain Intervention(s): Limited activity within patient's  tolerance;Monitored during session    [place] expects to be discharged to:: Private residence Living Arrangements: Spouse/significant other Available Help at Discharge: Family Type of Home: House Home Access: Stairs to enter Entrance Stairs-Rails: None Entrance Stairs-Number of Steps: 1 Home Layout: One level Home Equipment: Environmental consultant - 2  wheels;Shower seat Additional Comments: patient home alone during the day, wife works    Prior Function Level of Independence: Secondary school teacher / Transfers Assistance Needed: uses RW or a cane           Hand Dominance        Extremity/Trunk Assessment   Upper Extremity Assessment: Generalized weakness           Lower Extremity Assessment: LLE deficits/detail;RLE deficits/detail   LLE Deficits / Details: great toe drop  Cervical / Trunk Assessment: Kyphotic  Communication   Communication: Other (comment) (patient was not speaking coherently at times)  Cognition Arousal/Alertness: Lethargic;Suspect due to medications (a) Behavior During Therapy: Anxious Overall Cognitive Status: Impaired/Different from baseline Area of Impairment: Attention;Following commands;Awareness   Current Attention Level: Selective   Following Commands: Follows one step commands inconsistently       General Comments: patient deferred to hs wife for many questions, was acting very child =-like. The RN reports that the patient appeared normal mentation earleir.    General Comments      Exercises        Assessment/Plan    PT Assessment Patient needs continued PT services  PT Diagnosis Difficulty walking;Acute pain;Generalized weakness   PT Problem List Decreased strength;Decreased knowledge of use of DME;Decreased activity tolerance;Decreased range of motion;Decreased balance;Decreased mobility;Decreased knowledge of precautions;Decreased safety awareness;Pain  PT Treatment Interventions DME instruction;Gait training;Functional mobility training;Therapeutic activities;Patient/family education;Therapeutic exercise   PT Goals (Current goals can be found in the Care Plan section) Acute Rehab PT Goals Patient Stated Goal: to go home PT Goal Formulation: With patient/family Time For Goal Achievement: 06/17/16 Potential to Achieve Goals: Fair    Frequency Min 3X/week   Barriers  to discharge Decreased caregiver support      Co-evaluation               End of Session Equipment Utilized During Treatment: Gait belt Activity Tolerance: Patient limited by fatigue;Patient limited by pain Patient left: in bed;with call bell/phone within reach;with bed alarm set;with family/visitor present Nurse Communication: Mobility status         Time: 1205-1222 PT Time Calculation (min) (ACUTE ONLY): 17 min   Charges:   PT Evaluation $PT Eval Low Complexity: 1 Procedure     PT G CodesClaretha Cooper 06/03/2016, 2:07 PM

## 2016-06-03 NOTE — Progress Notes (Signed)
Pharmacy Antibiotic Note  Thomas Ferrell is a 75 y.o. male previously seen on 7/18 in [facility] ED and treated for CAP.  Now admitted on 06/02/2016 with continued fevers and persistent infiltrate on CXR.  Pharmacy has been consulted for vancomycin and cefepime dosing.  Today, 06/03/2016:  CBD obstruction seen on CT abd; MD would like to broaden to cover possible intra-abdominal infection; changing cefepime to Zosyn  Doxycycline added back to regimen for atypical coverage (failed outpatient CAP regimen) and long QTc  Plan:  Continue vancomycin and doxycycline as ordered  Zosyn 3.375 g IV given once over 30 minutes, then every 8 hrs by 4-hr infusion  Follow clinical course, renal function, culture results as available  Follow for de-escalation of antibiotics and LOT    Weight: 130 lb (59 kg)  Temp (24hrs), Avg:100.1 F (37.8 C), Min:98.6 F (37 C), Max:102.2 F (39 C)   Recent Labs Lab 05/29/16 0639 05/29/16 0646 06/02/16 1605 06/02/16 1615 06/02/16 1929 06/03/16 0115  WBC 16.5*  --  15.3*  --   --  15.2*  CREATININE 0.75 0.70 0.83  --   --  0.76  LATICACIDVEN  --   --   --  1.54 0.8 0.8    Estimated Creatinine Clearance: 68.6 mL/min (by C-G formula based on SCr of 0.8 mg/dL).    Allergies  Allergen Reactions  . Morphine And Related Itching    Antimicrobials this admission: Ceftriaxone x 1 + ZPak prior to admit Ceftriaxone + Doxy x 1 in ED Vancomycin 7/22 >>  Cefepime 7/22 >> 7/23 Doxycycline 7/23 >> Zosyn 7/23 >>  Dose adjustments this admission: ---  Microbiology results: 7/18 BCx: ngtd 7/22 BCx: sent 7/22 UCx: sent  7/22 MRSA PCR: ordered  Thank you for allowing pharmacy to be a part of this patient's care.  Reuel Boom, PharmD, BCPS Pager: [phone] 06/03/2016, 4:21 PM

## 2016-06-03 NOTE — Evaluation (Signed)
SLP Cancellation Note  Patient Details Name: Thomas Ferrell MRN: [id number] DOB: 1941-09-22   Cancelled treatment:       Reason Eval/Treat Not Completed: Medical issues which prohibited therapy (pt npo for MRI abdomen, will reattempt at later time)   Macario Golds 06/03/2016, 4:08 PM

## 2016-06-04 DIAGNOSIS — K8001 Calculus of gallbladder with acute cholecystitis with obstruction: Secondary | ICD-10-CM

## 2016-06-04 DIAGNOSIS — K830 Cholangitis: Secondary | ICD-10-CM

## 2016-06-04 LAB — COMPREHENSIVE METABOLIC PANEL
ALT: 184 U/L — ABNORMAL HIGH (ref 17–63)
ANION GAP: 10 (ref 5–15)
AST: 150 U/L — ABNORMAL HIGH (ref 15–41)
Albumin: 2.8 g/dL — ABNORMAL LOW (ref 3.5–5.0)
Alkaline Phosphatase: 269 U/L — ABNORMAL HIGH (ref 38–126)
BUN: 13 mg/dL (ref 6–20)
CALCIUM: 8.1 mg/dL — AB (ref 8.9–10.3)
CHLORIDE: 101 mmol/L (ref 101–111)
CO2: 24 mmol/L (ref 22–32)
CREATININE: 0.69 mg/dL (ref 0.61–1.24)
GFR calc non Af Amer: >60 mL/min (ref >60)
Glucose, Bld: 108 mg/dL — ABNORMAL HIGH (ref 65–99)
POTASSIUM: 3.5 mmol/L (ref 3.5–5.1)
SODIUM: 135 mmol/L (ref 135–145)
Total Bilirubin: 5 mg/dL — ABNORMAL HIGH (ref 0.3–1.2)
Total Protein: 6.2 g/dL — ABNORMAL LOW (ref 6.5–8.1)

## 2016-06-04 LAB — CBC
HCT: 30.8 % — ABNORMAL LOW (ref 39.0–52.0)
Hemoglobin: 9.9 g/dL — ABNORMAL LOW (ref 13.0–17.0)
MCH: 30 pg (ref 26.0–34.0)
MCHC: 32.1 g/dL (ref 30.0–36.0)
MCV: 93.3 fL (ref 78.0–100.0)
PLATELETS: 183 10*3/uL (ref 150–400)
RBC: 3.3 MIL/uL — ABNORMAL LOW (ref 4.22–5.81)
RDW: 13.4 % (ref 11.5–15.5)
WBC: 11.6 10*3/uL — ABNORMAL HIGH (ref 4.0–10.5)

## 2016-06-04 MED ORDER — SIMVASTATIN 20 MG PO TABS
20.0000 mg | ORAL_TABLET | Freq: Every day | ORAL | Status: DC
  Administered 2016-06-04 – 2016-06-07 (×4): 20 mg via ORAL
  Filled 2016-06-04 (×5): qty 2

## 2016-06-04 MED ORDER — SODIUM CHLORIDE 0.9 % IV SOLN: INTRAVENOUS | Status: AC

## 2016-06-04 NOTE — Progress Notes (Signed)
PROGRESS NOTE                                                                                                                                                                                                             Patient Demographics:    Thomas Ferrell, is a 75 y.o. male, DOB - Dec 04, 1940, PM:[id number]  Admit date - 06/02/2016   Admitting Physician Thurnell Lose, MD  Outpatient Primary MD for the patient is Orpah Melter, MD  LOS - 2  Chief Complaint  Patient presents with  . Pneumonia       Brief Narrative     Thomas Ferrell  is a 75 y.o. male,  With history of PAD status post bilateral femoral surgery by Dr. 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 He was placed on azithromycin for 5 days which gave him diarrhea but his main symptoms persisted.  Came to the ER where chest x-ray showed right-sided infiltrate, he had signs of sepsis with temperature of 103 and leukocytosis, I was called to admit the patient for pneumonia causing sepsis. Besides above review of systems patient is stable, he appears nontoxic, denies any chest or abdominal pain. No new focal weakness. Does have diarrhea but no blood or mucus in stool.   Subjective:    Naun Commerford today has, No headache, abd pain is better - No Nausea, No new weakness tingling or numbness, No Cough - SOB.     Assessment  & Plan :      1. Sepsis due to community-acquired pneumonia which has failed azithromycin and possibly due to #2 below. Has persistent infiltrate in the right lower lobe, denies any  episodes of choking or problems with swallowing food or liquids. However wife says that he does cough from time to time while eating. He will be admitted to a telemetry bed, sepsis protocol initiated, blood and sputum culture, strep pneumonia and Legionella antigen to be checked. Since he has failed azithromycin will be placed  on Doxy, Vancomycin and Cefepime. Monitor cultures. Supportive care with oxygen and nebulizer treatments. Flutter valve added and requested to sit in chair.  2. Possible subacute to chronic cholangitis with choledocholithiasis and ascending cholangitis Chronic low back pain, now vague Abd pain x 2 weeks.  Admission liver enzymes were stable, next day of admission he had some abdominal discomfort and liver enzymes spiked, case was discussed with GI physician Dr. Ardis Hughs at 3:45 PM on 06/03/2016, he he recommended MRCP which confirmed possible subacute to chronic cholecystitis with CBD stone. GI and surgery both have been consulted. He will now be seen by [facility] as his PCP is equal informed to me this morning by [facility]. We will keep him nothing by mouth continue Zosyn.  3. Prolonged QTC. Coreg, IV magnesium and telemetry monitor. QTc now improved 451 ms.  4. COPD. Stable no wheezing supportive care  5. Stroke. With poor baseline balance. Cane when ambulates.  6. Few coughing episodes while swallowing. Speech eval pending.  7. Dyslipidemia. On statin.  8. Essential hypertension. Place on low-dose Coreg and as needed hydralazine, hold ACE inhibitor due to sepsis.  9. Azithromycin induced diarrhea - resolved, no BM in > 14hrs after admission.  10. Sepsis induced mild Trop leak - B blocker, ASA, pain free, EKG non acute, flat Troponin trend.  11. ? Murmur - appreciated by ER MD - i could not, TTE stable .  12. Leukocytosis - somewhat chronic for at least 3-4 months, follow up with PCP    Family Communication  :  Wife  Code Status :  Full  Diet : Heart  Healthy  Disposition Plan  :  Stay inpt  Consults  :  None  Procedures  :    CT chest - lung nodule + R.infilterate  TTE - Mild LVH with LVEF 55-60%. Grade 1 diastolic dysfunction with normal estimated LV filling pressure. MAC with trivial mitral regurgitation. Moderately sclerotic aortic valve as outlined  above. Trivial tricuspid regurgitation with PASP 38 mmHg. Mildly  dilated RV with preserved contraction.  CT Abd - Pelvis - Possible subacute to chronic cholecystitis with CBD stone  MRI Abdomen/MRCP. Possible subacute to chronic cholecystitis with CBD stone   DVT Prophylaxis  :  Lovenox   Lab Results  Component Value Date   PLT 183 06/04/2016    Inpatient Medications  Scheduled Meds: . amLODipine  10 mg Oral Daily  . aspirin EC  325 mg Oral Daily  . carvedilol  3.125 mg Oral BID WC  . diatrizoate meglumine-sodium  30 mL Oral Once  . doxycycline  100 mg Oral Q12H  . enoxaparin (LOVENOX) injection  40 mg Subcutaneous Daily  . feeding supplement (ENSURE ENLIVE)  237 mL Oral TID WC  . piperacillin-tazobactam (ZOSYN)  IV  3.375 g Intravenous Q8H  . pregabalin  75 mg Oral Daily  . sertraline  100 mg Oral Daily  . simvastatin  20 mg Oral q1800  . sodium chloride  1,000 mL Intravenous Once  . vancomycin  500 mg Intravenous Q12H   Continuous Infusions: . sodium chloride Stopped (06/03/16 1747)   PRN Meds:.acetaminophen, albuterol, guaiFENesin-dextromethorphan, hydrALAZINE, LORazepam, ondansetron (ZOFRAN) IV, oxyCODONE  Antibiotics  :    Anti-infectives    Start     Dose/Rate Route Frequency Ordered Stop   06/03/16 2300  piperacillin-tazobactam (ZOSYN) IVPB 3.375 g     3.375 g 12.5 mL/hr over 240 Minutes Intravenous Every 8 hours 06/03/16 1620     06/03/16 2200  doxycycline (VIBRA-TABS) tablet 100 mg     100 mg Oral Every 12 hours 06/03/16 1502     06/03/16 1700  piperacillin-tazobactam (ZOSYN) IVPB 3.375 g     3.375 g 100 mL/hr over 30 Minutes Intravenous  Once  06/03/16 1620 06/03/16 1714   06/03/16 1130  doxycycline (VIBRA-TABS) tablet 100 mg  Status:  Discontinued     100 mg Oral Daily 06/03/16 1036 06/03/16 1502   06/03/16 0600  vancomycin (VANCOCIN) 500 mg in sodium chloride 0.9 % 100 mL IVPB     500 mg 100 mL/hr over 60 Minutes Intravenous Every 12 hours 06/02/16 1826     06/02/16 2000  ceFEPIme (MAXIPIME) 1 g in dextrose 5 % 50 mL IVPB  Status:  Discontinued     1 g 100 mL/hr over 30 Minutes Intravenous Every 8 hours 06/02/16 1747 06/03/16 1544   06/02/16 1830  vancomycin (VANCOCIN) IVPB 1000 mg/200 mL premix     1,000 mg 200 mL/hr over 60 Minutes Intravenous  Once 06/02/16 1816 06/03/16 0020   06/02/16 1630  cefTRIAXone (ROCEPHIN) 1 g in dextrose 5 % 50 mL IVPB     1 g 100 mL/hr over 30 Minutes Intravenous  Once 06/02/16 1622 06/02/16 1700   06/02/16 1630  doxycycline (VIBRA-TABS) tablet 100 mg     100 mg Oral  Once 06/02/16 1622 06/02/16 1640         Objective:   Vitals:   06/03/16 1720 06/03/16 1725 06/03/16 2104 06/04/16 0525  BP: (!) 112/57  (!) 114/53 (!) 118/57  Pulse:  82 71 72  Resp: 18  18 20   Temp: 99.6 F (37.6 C)  99.9 F (37.7 C) 99.2 F (37.3 C)  TempSrc: Tympanic  Oral Oral  SpO2: 96%  98% 97%  Weight:      Height:        Wt Readings from Last 3 Encounters:  06/02/16 59 kg (130 lb)  05/29/16 59 kg (130 lb)  02/02/16 56.7 kg (125 lb)     Intake/Output Summary (Last 24 hours) at 06/04/16 1029 Last data filed at 06/04/16 0600  Gross per 24 hour  Intake          1398.34 ml  Output              775 ml  Net           623.34 ml     Physical Exam  Awake Alert, Oriented X 3, No new F.N deficits, Normal affect [place].AT,PERRAL Supple Neck,No JVD, No cervical lymphadenopathy appriciated.  Symmetrical Chest wall movement, Good air movement bilaterally, CTAB RRR,No Gallops,Rubs or new Murmurs, No Parasternal Heave +ve B.Sounds, Abd Soft, No tenderness, No organomegaly appriciated, No rebound - guarding or  rigidity. No Cyanosis, Clubbing or edema, No new Rash or bruise      Data Review:    CBC  Recent Labs Lab 05/29/16 0639 05/29/16 0646 06/02/16 1605 06/03/16 0115 06/04/16 0510  WBC 16.5*  --  15.3* 15.2* 11.6*  HGB 13.6 14.6 12.3* 11.4* 9.9*  HCT 40.5 43.0 37.2* 34.4* 30.8*  PLT 272  --  246 214 183  MCV 91.8  --  91.4 91.5 93.3  MCH 30.8  --  30.2 30.3 30.0  MCHC 33.6  --  33.1 33.1 32.1  RDW 13.0  --  13.1 13.3 13.4  LYMPHSABS  --   --  0.6*  --   --   MONOABS  --   --  0.5  --   --  EOSABS  --   --  0.0  --   --   BASOSABS  --   --  0.0  --   --     Chemistries   Recent Labs Lab 05/29/16 0639 05/29/16 0646 06/02/16 1605 06/03/16 0115 06/03/16 1553 06/04/16 0510  NA 138 140 136 135  --  135  K 3.1* 3.1* 3.6 3.5  --  3.5  CL 101 100* 100* 102  --  101  CO2 25  --  26 23  --  24  GLUCOSE 161* 158* 144* 164*  --  108*  BUN 9 8 8 9   --  13  CREATININE 0.75 0.70 0.83 0.76  --  0.69  CALCIUM 9.2  --  9.2 8.5*  --  8.1*  MG 1.8  --   --   --   --   --   AST 26  --  21  --  339* 150*  ALT 21  --  28  --  271* 184*  ALKPHOS 115  --  116  --  292* 269*  BILITOT 0.4  --  0.7  --  4.4* 5.0*   ------------------------------------------------------------------------------------------------------------------ No results for input(s): CHOL, HDL, LDLCALC, TRIG, CHOLHDL, LDLDIRECT in the last 72 hours.  Lab Results  Component Value Date   HGBA1C  01/30/2011    5.6 (NOTE)                                                                       According to the ADA Clinical Practice Recommendations for 2011, when HbA1c is used as a screening test:   >=6.5%   Diagnostic of Diabetes Mellitus           (if abnormal result  is confirmed)  5.7-6.4%   Increased risk of developing Diabetes Mellitus  References:Diagnosis and Classification of Diabetes Mellitus,Diabetes Care,2011,34(Suppl 1):S62-S69 and Standards of Medical Care in         Diabetes - 2011,Diabetes [id number]    (Suppl 1):S11-S61.   ------------------------------------------------------------------------------------------------------------------ No results for input(s): TSH, T4TOTAL, T3FREE, THYROIDAB in the last 72 hours.  Invalid input(s): FREET3 ------------------------------------------------------------------------------------------------------------------ No results for input(s): VITAMINB12, FOLATE, FERRITIN, TIBC, IRON, RETICCTPCT in the last 72 hours.  Coagulation profile  Recent Labs Lab 06/02/16 1815 06/03/16 1553  INR 1.20 1.46    No results for input(s): DDIMER in the last 72 hours.  Cardiac Enzymes  Recent Labs Lab 06/02/16 1605 06/02/16 1935 06/03/16 0115  TROPONINI 0.03* 0.03* 0.03*   ------------------------------------------------------------------------------------------------------------------    Component Value Date/Time   BNP 92.3 05/29/2016 [id number]    Micro Results Recent Results (from the past 240 hour(s))  Blood culture (routine x 2)     Status: None   Collection Time: 05/29/16  8:50 AM  Result Value Ref Range Status   Specimen Description BLOOD LEFT ANTECUBITAL  Final   Special Requests BOTTLES DRAWN AEROBIC AND ANAEROBIC 5CC  Final   Culture   Final    NO GROWTH 5 DAYS Performed at [facility]    Report Status 06/03/2016 FINAL  Final  Blood culture (routine x 2)     Status: None   Collection Time: 05/29/16  8:54 AM  Result Value Ref Range Status  Specimen Description BLOOD RIGHT ANTECUBITAL  Final   Special Requests BOTTLES DRAWN AEROBIC AND ANAEROBIC 5ML  Final   Culture   Final    NO GROWTH 5 DAYS Performed at [facility]    Report Status 06/03/2016 FINAL  Final  Culture, blood (Routine x 2)     Status: None (Preliminary result)   Collection Time: 06/02/16  4:10 PM  Result Value Ref Range Status   Specimen Description BLOOD RIGHT ARM  5 ML IN [facility] BOTTLE  Final   Special Requests NONE  Final   Culture   Final    NO  GROWTH < 24 HOURS Performed at [facility]    Report Status PENDING  Incomplete  Culture, blood (Routine x 2)     Status: None (Preliminary result)   Collection Time: 06/02/16  4:10 PM  Result Value Ref Range Status   Specimen Description BLOOD LEFT HAND  10 ML IN [facility] BOTTLE  Final   Special Requests NONE  Final   Culture   Final    NO GROWTH < 24 HOURS Performed at [facility]    Report Status PENDING  Incomplete  Urine culture     Status: None   Collection Time: 06/02/16  5:10 PM  Result Value Ref Range Status   Specimen Description URINE, CLEAN CATCH  Final   Special Requests NONE  Final   Culture NO GROWTH Performed at [facility]   Final   Report Status 06/03/2016 FINAL  Final    Radiology Reports Dg Chest 2 View  Result Date: 06/03/2016 CLINICAL DATA:  75 year old male with fever and cough, pneumonia. EXAM: CHEST  2 VIEW COMPARISON:  06/02/2016 FINDINGS: The cardiomediastinal silhouette is unremarkable. Streaky lingular opacity is unchanged. Mild peribronchial thickening is again identified. Minimal bibasilar opacities/atelectasis again noted. There is no evidence of pneumothorax, pleural effusion or acute bony abnormality. IMPRESSION: Unchanged appearance of the chest with streaky lingular opacity and mild basilar opacity/atelectasis. Electronically Signed   By: Margarette Canada M.D.   On: 06/03/2016 16:27  Dg Chest 2 View  Result Date: 06/02/2016 CLINICAL DATA:  Fever and cough EXAM: CHEST  2 VIEW COMPARISON:  May 29, 2016 FINDINGS: Persistent lingular opacity. There is also mild opacity in the right base which is unchanged. No other interval changes. IMPRESSION: Persistent infiltrates in the lingula and right base. Recommend follow-up to resolution. Electronically Signed   By: Dorise Bullion III M.D   On: 06/02/2016 16:51   Dg Chest 2 View  Result Date: 05/29/2016 CLINICAL DATA:  Chest pain EXAM: CHEST  2 VIEW COMPARISON:  12/22/2013 FINDINGS: There  is a new rounded opacity near the left hilum with neighboring atelectasis or scar. There is no edema, consolidation, effusion, or pneumothorax. Chronic bronchitic markings. Normal heart size and aortic contours. Aortic atherosclerosis. IMPRESSION: 1. Left perihilar nodule.  Recommend chest CT. 2. History of COPD.  Stable bronchitic markings. Electronically Signed   By: Monte Fantasia M.D.   On: 05/29/2016 06:38   Ct Abdomen Pelvis W Contrast  Result Date: 06/03/2016 CLINICAL DATA:  Chronic upper abdominal and back pain for weeks. Sepsis. EXAM: CT ABDOMEN AND PELVIS WITH CONTRAST TECHNIQUE: Multidetector CT imaging of the abdomen and pelvis was performed using the standard protocol following bolus administration of intravenous contrast. CONTRAST:  115mL ISOVUE-300 IOPAMIDOL (ISOVUE-300) INJECTION 61% COMPARISON:  CT abdomen dated 09/16/2015. FINDINGS: Lower chest: Patchy small consolidations at each lung base, most likely atelectasis. Hepatobiliary: Significant thickening of the  gallbladder walls, similar to the appearance on earlier CT abdomen of 09/16/2015. At least mildly increased pericholecystic edema. New common bile duct dilatation and prominent intrahepatic bile duct dilatation. Questionable small stones within the distal common bile duct. No focal mass or lesion within the liver. Pancreas: No mass, inflammatory changes, or other significant abnormality. Spleen: Within normal limits in size and appearance. Adrenals/Urinary Tract: Adrenal glands are bulbous in configuration, similar to previous exams, without circumscribed mass. Left renal cysts. No renal stone or hydronephrosis bilaterally. No perinephric inflammation. No ureteral or bladder calculi identified. Bladder is unremarkable, partially decompressed. Stomach/Bowel: Bowel is normal in caliber. No bowel wall thickening or evidence of bowel wall inflammation. Appendix is not seen but there are no inflammatory changes about the cecum to suggest  acute appendicitis. Stomach appears normal. Vascular/Lymphatic: Heavy atherosclerotic changes of the normal caliber abdominal aorta, aortic branch vessels and pelvic vasculature. Small and mildly prominent lymph nodes noted within the upper abdomen. No enlarged lymph nodes within the lower abdomen or pelvis. Reproductive: No mass or other significant abnormality. Other: Trace free fluid in the lower pelvis. Trace fluid at the lower margin of the liver. No abscess collections seen. No free intraperitoneal air. Musculoskeletal: No acute or suspicious osseous finding. Fixation hardware within the lumbar spine appears appropriately positioned. Superficial soft tissues are unremarkable for acute process. 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 IMPRESSION: 1. New common bile duct and intrahepatic bile duct dilatation. Questionable small stones within the distal common bile duct but not definitive. Recommend further characterization with ERCP or MRCP. 2. Prominent gallbladder wall thickening and pericholecystic edema. The gallbladder wall thickening is similar to findings on earlier CT abdomen of 09/16/2015, suggesting recurrent versus chronic cholecystitis. Pericholecystic edema is slightly more prominent on today's exam. 3. Mildly prominent lymph nodes within the upper abdomen, likely reactive in nature. 4. Aortic atherosclerosis. Additional chronic/incidental findings detailed above. These results were called by telephone at the time of interpretation on 06/03/2016 at 3:33 pm to Dr. Lala Lund , who verbally acknowledged these results. Electronically Signed   By: Franki Cabot M.D.   On: 06/03/2016 15:40  Mr 3d Recon At Scanner  Result Date: 06/03/2016 CLINICAL DATA:  Patient with new common bile duct dilatation. Evaluate for choledocholithiasis. EXAM: MRI ABDOMEN WITHOUT AND WITH CONTRAST (INCLUDING MRCP) TECHNIQUE: Multiplanar multisequence MR imaging of the abdomen was performed both before and after the administration  of intravenous contrast. Heavily T2-weighted images of the biliary and pancreatic ducts were obtained, and three-dimensional MRCP images were rendered by post processing. CONTRAST:  70mL MULTIHANCE GADOBENATE DIMEGLUMINE 529 MG/ML IV SOLN COMPARISON:  CT abdomen pelvis 06/03/2016; 09/16/2015 FINDINGS: Lower chest: Patchy consolidation within the lingula and right lower lobe. No pleural effusion. Normal heart size. Hepatobiliary: The liver is normal in size and contour. No focal hepatic lesion is identified. There is circumferential wall thickening of the gallbladder with mild surrounding edema. New intrahepatic and extrahepatic biliary ductal dilatation. Common bile duct measures up to 9 mm. There are a few tiny filling defects demonstrated within the common bile duct within the mid aspect and distal aspect (image 28; series 6) concerning for choledocholithiasis. Pancreas: Unremarkable. Spleen: Unremarkable Adrenals/Urinary Tract: Unchanged thickening of the bilateral adrenal glands. Kidneys enhance symmetrically with contrast. There is a 2.6 cm cyst within the interpolar region of the left kidney. Stomach/Bowel: No abnormal bowel wall thickening or evidence for bowel obstruction. Vascular/Lymphatic: Normal caliber abdominal aorta. Multiple enlarged porta hepatic and upper abdominal lymph nodes including a 1.4 cm  porta hepatic lymph node (image 22; series 4). Other: None. Musculoskeletal: No aggressive or acute appearing osseous lesions. Lumbar spinal fusion hardware. IMPRESSION: Interval development of intrahepatic and extrahepatic biliary ductal dilatation. Multiple small filling defects are demonstrated within the common bile duct suggestive of choledocholithiasis. Additionally there is cholelithiasis as well as gallbladder wall thickening and small amount of associated pericholecystic edema concerning for acute cholecystitis. Nonspecific porta hepatic and upper abdominal adenopathy, potentially reactive in  etiology. Recommend clinical and laboratory correlation. Patchy consolidation within the lingula and right lower lobe may represent infection or atelectasis. Continued radiographic followup to ensure resolution is recommended. Electronically Signed   By: Lovey Newcomer M.D.   On: 06/03/2016 20:05  Mr Jeananne Rama W/wo Cm/mrcp  Result Date: 06/03/2016 CLINICAL DATA:  Patient with new common bile duct dilatation. Evaluate for choledocholithiasis. EXAM: MRI ABDOMEN WITHOUT AND WITH CONTRAST (INCLUDING MRCP) TECHNIQUE: Multiplanar multisequence MR imaging of the abdomen was performed both before and after the administration of intravenous contrast. Heavily T2-weighted images of the biliary and pancreatic ducts were obtained, and three-dimensional MRCP images were rendered by post processing. CONTRAST:  87mL MULTIHANCE GADOBENATE DIMEGLUMINE 529 MG/ML IV SOLN COMPARISON:  CT abdomen pelvis 06/03/2016; 09/16/2015 FINDINGS: Lower chest: Patchy consolidation within the lingula and right lower lobe. No pleural effusion. Normal heart size. Hepatobiliary: The liver is normal in size and contour. No focal hepatic lesion is identified. There is circumferential wall thickening of the gallbladder with mild surrounding edema. New intrahepatic and extrahepatic biliary ductal dilatation. Common bile duct measures up to 9 mm. There are a few tiny filling defects demonstrated within the common bile duct within the mid aspect and distal aspect (image 28; series 6) concerning for choledocholithiasis. Pancreas: Unremarkable. Spleen: Unremarkable Adrenals/Urinary Tract: Unchanged thickening of the bilateral adrenal glands. Kidneys enhance symmetrically with contrast. There is a 2.6 cm cyst within the interpolar region of the left kidney. Stomach/Bowel: No abnormal bowel wall thickening or evidence for bowel obstruction. Vascular/Lymphatic: Normal caliber abdominal aorta. Multiple enlarged porta hepatic and upper abdominal lymph nodes including a  1.4 cm porta hepatic lymph node (image 22; series 4). Other: None. Musculoskeletal: No aggressive or acute appearing osseous lesions. Lumbar spinal fusion hardware. IMPRESSION: Interval development of intrahepatic and extrahepatic biliary ductal dilatation. Multiple small filling defects are demonstrated within the common bile duct suggestive of choledocholithiasis. Additionally there is cholelithiasis as well as gallbladder wall thickening and small amount of associated pericholecystic edema concerning for acute cholecystitis. Nonspecific porta hepatic and upper abdominal adenopathy, potentially reactive in etiology. Recommend clinical and laboratory correlation. Patchy consolidation within the lingula and right lower lobe may represent infection or atelectasis. Continued radiographic followup to ensure resolution is recommended. Electronically Signed   By: Lovey Newcomer M.D.   On: 06/03/2016 20:05  Ct Angio Chest Aorta W/cm &/or Wo/cm  Result Date: 05/29/2016 CLINICAL DATA:  Chest pain starting 3 a.m., nausea, abnormal chest x-ray, history of bladder cancer EXAM: CT ANGIOGRAPHY CHEST without and WITH CONTRAST TECHNIQUE: Multidetector CT imaging of the chest was performed using the standard protocol during bolus administration of intravenous contrast. Multiplanar CT image reconstructions and MIPs were obtained to evaluate the vascular anatomy. Unenhanced images of the chest were provided. CONTRAST:  100 cc Isovue COMPARISON:  CT chest 03/21/2010 FINDINGS: Cardiovascular: Unenhanced images shows atherosclerotic calcifications of thoracic aorta and coronary arteries. Atherosclerotic calcifications are noted upper abdominal aorta, SMA origin, os celiac trunk origin and bilateral renal artery origin. Heart size within normal limits. No pericardial effusion. Arterial  phase enhanced images demonstrates no evidence of aortic aneurysm or aortic dissection. The study is of excellent technical quality. No pulmonary embolus  is noted. Pulmonary veins are unremarkable. Mediastinum/Nodes: There is no mediastinal hematoma or adenopathy. No hilar adenopathy is noted. Lungs/Pleura: Images of the lung parenchyma shows bilateral emphysematous changes especially in upper lobes. As noted on the chest x-ray there is triangular shape consolidation in lingula anteromedial with some air bronchogram. Some mucus bronchial plugging is noted in axial image 44. Findings are highly suspicious for small focal pneumonia. Additional focal infiltrate/ pneumonia noted in right middle lobe medially and laterally please see axial image 70. Axial image 69 there is 6 mm nodule in left base/left lower lobe anteriorly. Upper Abdomen: The visualized upper abdomen shows stable thickening bilateral adrenal glands left greater than right. There is fatty infiltration of the liver. No calcified gallstones are noted within visualize gallbladder. Visualized pancreas and spleen is unremarkable. Visualized upper kidneys are unremarkable. Musculoskeletal: Sagittal images of the spine shows degenerative changes thoracic spine. Sagittal view of the sternum is unremarkable. Review of the MIP images confirms the above findings. IMPRESSION: 1. No aortic aneurysm or aortic dissection.  No pulmonary embolus. 2. Atherosclerotic calcifications of thoracic aorta, abdominal aorta, celiac trunk and SMA origin, bilateral renal artery origin. Atherosclerotic calcifications of coronary arteries. 3. No mediastinal hematoma or adenopathy. There is small somewhat triangular-shaped consolidation in lingula anteromedially with some air bronchogram. Findings highly suspicious for focal pneumonia. Additional focal infiltrate/pneumonia noted in right middle lobe laterally and medially. Follow-up to resolution after treatment is recommended. 4. There is 6 mm nodule in left base anteriorly/ left lower lobe. Non-contrast chest CT at 6-12 months is recommended. If the nodule is stable at time of repeat  CT, then future CT at 18-24 months (from today's scan) is considered optional for low-risk patients, but is recommended for high-risk patients. This recommendation follows the consensus statement: Guidelines for Management of Incidental Pulmonary Nodules Detected on CT Images:From the Fleischner Society 2017; published online before print (10.1148/radiol.[id number]). 5. Bilateral emphysematous changes are noted. 6. Fatty infiltration of the liver. Stable bilateral thickening of adrenal glands. Electronically Signed   By: Lahoma Crocker M.D.   On: 05/29/2016 08:07    Time Spent in minutes  30   SINGH,PRASHANT K M.D on 06/04/2016 at 10:29 AM  Between 7am to 7pm - Pager - [phone]  After 7pm go to [email or web address] - password [facility]  [facility] -  Office  [phone]

## 2016-06-04 NOTE — Consult Note (Signed)
Reason for Consult: Cholecystitis Referring Physician: Dr Burnice Logan is an 75 y.o. male.  HPI: 75 year old Caucasian male with peripheral vascular disease, coronary artery disease, hypertension, dyslipidemia, remote history of CVA comes back to the hospital with fever along with nausea and vomiting. He had been to the emergency room around July 18 with a productive cough and diagnosed with community-acquired pneumonia. He states on day of admission he developed persistent nausea vomiting after eating. He had a full sensation after eating only a small amount of food. He had some generalized upper abdominal discomfort. He reports a remote history of feeling full after eating a meal many years ago. He took some antacids which help with the discomfort. He was admitted for fever, nausea and vomiting. His liver function tests increased the day after admission. This prompted a CT scan which showed radiological evidence of a acute cholecystitis. MRCP was performed which demonstrated several common bile duct stones. He denies any prior abdominal surgery. He denies any weight loss. He denies any frequent NSAID use. He denies any jaundice. He denies any acholic stools.  Past Medical History:  Diagnosis Date  . Abnormality of gait 03/03/2013  . bladder ca dx'd 11/2009   chemo/xrt comp 02/2010  . Cerebrovascular disease    right brain CVA 2007  . COPD (chronic obstructive pulmonary disease) ([place])   . Cough productive of clear sputum    TX RECENT SINUS INFECTION   . CVA (cerebral vascular accident) ([place]) 2007   r-cva  . Dyslipidemia   . Hypertension   . Lumbago     Past Surgical History:  Procedure Laterality Date  . ABDOMINAL AORTAGRAM N/A 12/07/2013   Procedure: ABDOMINAL Maxcine Ham;  Surgeon: Angelia Mould, MD;  Location: [facility] CATH LAB;  Service: Cardiovascular;  Laterality: N/A;  . BACK SURGERY  2010  . bladder cancer    . CAROTID ARTERY ANGIOPLASTY  2009  . FEMORAL-FEMORAL BYPASS  GRAFT N/A 12/09/2013   Procedure: BYPASS GRAFT FEMORAL-FEMORAL ARTERY WITH BILATERAL ENDARTERECTOMY ;  Surgeon: Elam Dutch, MD;  Location: [facility] OR;  Service: Vascular;  Laterality: N/A;  . FEMORAL-POPLITEAL BYPASS GRAFT Left 12/09/2013   Procedure: BYPASS GRAFT FEMORAL-POPLITEAL ARTERY - LEFT ;  Surgeon: Elam Dutch, MD;  Location: [facility];  Service: Vascular;  Laterality: Left;  . LAMINECTOMY  09/08/2013   L 2 L3 L4 L5       Dr Luiz Ochoa    Family History  Problem Relation Age of Onset  . Stroke Mother   . Cancer Father   . Dementia Father   . Cancer Sister     Social History:  reports that he quit smoking about 10 years ago. His smoking use included Cigarettes. He has never used smokeless tobacco. He reports that he does not drink alcohol or use drugs.  Allergies:  Allergies  Allergen Reactions  . Morphine And Related Itching    Medications: I have reviewed the patient's current medications.  Results for orders placed or performed during the hospital encounter of 06/02/16 (from the past 48 hour(s))  Comprehensive metabolic panel     Status: Abnormal   Collection Time: 06/02/16  4:05 PM  Result Value Ref Range   Sodium 136 135 - 145 mmol/L   Potassium 3.6 3.5 - 5.1 mmol/L   Chloride 100 (L) 101 - 111 mmol/L   CO2 26 22 - 32 mmol/L   Glucose, Bld 144 (H) 65 - 99 mg/dL   BUN 8 6 - 20 mg/dL  Creatinine, Ser 0.83 0.61 - 1.24 mg/dL   Calcium 9.2 8.9 - 10.3 mg/dL   Total Protein 7.8 6.5 - 8.1 g/dL   Albumin 4.0 3.5 - 5.0 g/dL   AST 21 15 - 41 U/L   ALT 28 17 - 63 U/L   Alkaline Phosphatase 116 38 - 126 U/L   Total Bilirubin 0.7 0.3 - 1.2 mg/dL   GFR calc non Af Amer >60 >60 mL/min   GFR calc Af Amer >60 >60 mL/min    Comment: (NOTE) The eGFR has been calculated using the CKD EPI equation. This calculation has not been validated in all clinical situations. eGFR's persistently <60 mL/min signify possible Chronic Kidney Disease.    Anion gap 10 5 - 15  CBC with  Differential     Status: Abnormal   Collection Time: 06/02/16  4:05 PM  Result Value Ref Range   WBC 15.3 (H) 4.0 - 10.5 K/uL   RBC 4.07 (L) 4.22 - 5.81 MIL/uL   Hemoglobin 12.3 (L) 13.0 - 17.0 g/dL   HCT 37.2 (L) 39.0 - 52.0 %   MCV 91.4 78.0 - 100.0 fL   MCH 30.2 26.0 - 34.0 pg   MCHC 33.1 30.0 - 36.0 g/dL   RDW 13.1 11.5 - 15.5 %   Platelets 246 150 - 400 K/uL   Neutrophils Relative % 93 %   Neutro Abs 14.2 (H) 1.7 - 7.7 K/uL   Lymphocytes Relative 4 %   Lymphs Abs 0.6 (L) 0.7 - 4.0 K/uL   Monocytes Relative 3 %   Monocytes Absolute 0.5 0.1 - 1.0 K/uL   Eosinophils Relative 0 %   Eosinophils Absolute 0.0 0.0 - 0.7 K/uL   Basophils Relative 0 %   Basophils Absolute 0.0 0.0 - 0.1 K/uL  Troponin I     Status: Abnormal   Collection Time: 06/02/16  4:05 PM  Result Value Ref Range   Troponin I 0.03 (HH) <0.03 ng/mL    Comment: CRITICAL RESULT CALLED TO, READ BACK BY AND VERIFIED WITH: BINGHAM,S AT 4:50PM ON 06/02/16 BY FESTERMAN,C   Culture, blood (Routine x 2)     Status: None (Preliminary result)   Collection Time: 06/02/16  4:10 PM  Result Value Ref Range   Specimen Description BLOOD RIGHT ARM  5 ML IN [facility] BOTTLE    Special Requests NONE    Culture      NO GROWTH < 24 HOURS Performed at [facility]    Report Status PENDING   Culture, blood (Routine x 2)     Status: None (Preliminary result)   Collection Time: 06/02/16  4:10 PM  Result Value Ref Range   Specimen Description BLOOD LEFT HAND  10 ML IN [facility] BOTTLE    Special Requests NONE    Culture      NO GROWTH < 24 HOURS Performed at [facility]    Report Status PENDING   I-Stat CG4 Lactic Acid, ED     Status: None   Collection Time: 06/02/16  4:15 PM  Result Value Ref Range   Lactic Acid, Venous 1.54 0.5 - 1.9 mmol/L  Urine culture     Status: None   Collection Time: 06/02/16  5:10 PM  Result Value Ref Range   Specimen Description URINE, CLEAN CATCH    Special Requests NONE    Culture NO  GROWTH Performed at [facility]     Report Status 06/03/2016 FINAL   Urinalysis, Routine w reflex microscopic  Status: Abnormal   Collection Time: 06/02/16  5:10 PM  Result Value Ref Range   Color, Urine YELLOW YELLOW   APPearance CLEAR CLEAR   Specific Gravity, Urine 1.016 1.005 - 1.030   pH 6.0 5.0 - 8.0   Glucose, UA NEGATIVE NEGATIVE mg/dL   Hgb urine dipstick TRACE (A) NEGATIVE   Bilirubin Urine NEGATIVE NEGATIVE   Ketones, ur NEGATIVE NEGATIVE mg/dL   Protein, ur 30 (A) NEGATIVE mg/dL   Nitrite NEGATIVE NEGATIVE   Leukocytes, UA NEGATIVE NEGATIVE  Urine microscopic-add on     Status: None   Collection Time: 06/02/16  5:10 PM  Result Value Ref Range   Squamous Epithelial / LPF NONE SEEN NONE SEEN   WBC, UA 0-5 0 - 5 WBC/hpf   RBC / HPF 0-5 0 - 5 RBC/hpf   Bacteria, UA NONE SEEN NONE SEEN  HIV antibody     Status: None   Collection Time: 06/02/16  6:15 PM  Result Value Ref Range   HIV Screen 4th Generation wRfx Non Reactive Non Reactive    Comment: (NOTE) Performed At: [facility] [address] [place], [place] [id number] Lindon Romp MD BH:[id number]   Procalcitonin     Status: None   Collection Time: 06/02/16  6:15 PM  Result Value Ref Range   Procalcitonin 0.18 ng/mL    Comment:        Interpretation: PCT (Procalcitonin) <= 0.5 ng/mL: Systemic infection (sepsis) is not likely. Local bacterial infection is possible. (NOTE)         ICU PCT Algorithm               Non ICU PCT Algorithm    ----------------------------     ------------------------------         PCT < 0.25 ng/mL                 PCT < 0.1 ng/mL     Stopping of antibiotics            Stopping of antibiotics       strongly encouraged.               strongly encouraged.    ----------------------------     ------------------------------       PCT level decrease by               PCT < 0.25 ng/mL       >= 80% from peak PCT       OR PCT 0.25 - 0.5 ng/mL          Stopping of  antibiotics                                             encouraged.     Stopping of antibiotics           encouraged.    ----------------------------     ------------------------------       PCT level decrease by              PCT >= 0.25 ng/mL       < 80% from peak PCT        AND PCT >= 0.5 ng/mL            Continuin g antibiotics  encouraged.       Continuing antibiotics            encouraged.    ----------------------------     ------------------------------     PCT level increase compared          PCT > 0.5 ng/mL         with peak PCT AND          PCT >= 0.5 ng/mL             Escalation of antibiotics                                          strongly encouraged.      Escalation of antibiotics        strongly encouraged.   Protime-INR     Status: Abnormal   Collection Time: 06/02/16  6:15 PM  Result Value Ref Range   Prothrombin Time 15.3 (H) 11.6 - 15.2 seconds   INR 1.20 0.00 - 1.49  APTT     Status: None   Collection Time: 06/02/16  6:15 PM  Result Value Ref Range   aPTT 36 24 - 37 seconds  Lactic acid, plasma     Status: None   Collection Time: 06/02/16  7:29 PM  Result Value Ref Range   Lactic Acid, Venous 0.8 0.5 - 1.9 mmol/L  Troponin I (q 6hr x 3)     Status: Abnormal   Collection Time: 06/02/16  7:35 PM  Result Value Ref Range   Troponin I 0.03 (HH) <0.03 ng/mL    Comment: CRITICAL VALUE NOTED.  VALUE IS CONSISTENT WITH PREVIOUSLY REPORTED AND CALLED VALUE.  CBC     Status: Abnormal   Collection Time: 06/03/16  1:15 AM  Result Value Ref Range   WBC 15.2 (H) 4.0 - 10.5 K/uL   RBC 3.76 (L) 4.22 - 5.81 MIL/uL   Hemoglobin 11.4 (L) 13.0 - 17.0 g/dL   HCT 34.4 (L) 39.0 - 52.0 %   MCV 91.5 78.0 - 100.0 fL   MCH 30.3 26.0 - 34.0 pg   MCHC 33.1 30.0 - 36.0 g/dL   RDW 13.3 11.5 - 15.5 %   Platelets 214 150 - 400 K/uL  Basic metabolic panel     Status: Abnormal   Collection Time: 06/03/16  1:15 AM  Result Value Ref Range    Sodium 135 135 - 145 mmol/L   Potassium 3.5 3.5 - 5.1 mmol/L   Chloride 102 101 - 111 mmol/L   CO2 23 22 - 32 mmol/L   Glucose, Bld 164 (H) 65 - 99 mg/dL   BUN 9 6 - 20 mg/dL   Creatinine, Ser 0.76 0.61 - 1.24 mg/dL   Calcium 8.5 (L) 8.9 - 10.3 mg/dL   GFR calc non Af Amer >60 >60 mL/min   GFR calc Af Amer >60 >60 mL/min    Comment: (NOTE) The eGFR has been calculated using the CKD EPI equation. This calculation has not been validated in all clinical situations. eGFR's persistently <60 mL/min signify possible Chronic Kidney Disease.    Anion gap 10 5 - 15  Lactic acid, plasma     Status: None   Collection Time: 06/03/16  1:15 AM  Result Value Ref Range   Lactic Acid, Venous 0.8 0.5 - 1.9 mmol/L  Troponin I (q 6hr x 3)     Status: Abnormal  Collection Time: 06/03/16  1:15 AM  Result Value Ref Range   Troponin I 0.03 (HH) <0.03 ng/mL    Comment: CRITICAL VALUE NOTED.  VALUE IS CONSISTENT WITH PREVIOUSLY REPORTED AND CALLED VALUE.  Hepatic function panel     Status: Abnormal   Collection Time: 06/03/16  3:53 PM  Result Value Ref Range   Total Protein 6.8 6.5 - 8.1 g/dL   Albumin 3.2 (L) 3.5 - 5.0 g/dL   AST 339 (H) 15 - 41 U/L   ALT 271 (H) 17 - 63 U/L   Alkaline Phosphatase 292 (H) 38 - 126 U/L   Total Bilirubin 4.4 (H) 0.3 - 1.2 mg/dL   Bilirubin, Direct 2.9 (H) 0.1 - 0.5 mg/dL   Indirect Bilirubin 1.5 (H) 0.3 - 0.9 mg/dL  Protime-INR     Status: Abnormal   Collection Time: 06/03/16  3:53 PM  Result Value Ref Range   Prothrombin Time 17.8 (H) 11.6 - 15.2 seconds   INR 1.46 0.00 - 1.49  CBC     Status: Abnormal   Collection Time: 06/04/16  5:10 AM  Result Value Ref Range   WBC 11.6 (H) 4.0 - 10.5 K/uL   RBC 3.30 (L) 4.22 - 5.81 MIL/uL   Hemoglobin 9.9 (L) 13.0 - 17.0 g/dL   HCT 30.8 (L) 39.0 - 52.0 %   MCV 93.3 78.0 - 100.0 fL   MCH 30.0 26.0 - 34.0 pg   MCHC 32.1 30.0 - 36.0 g/dL   RDW 13.4 11.5 - 15.5 %   Platelets 183 150 - 400 K/uL  Comprehensive metabolic  panel     Status: Abnormal   Collection Time: 06/04/16  5:10 AM  Result Value Ref Range   Sodium 135 135 - 145 mmol/L   Potassium 3.5 3.5 - 5.1 mmol/L   Chloride 101 101 - 111 mmol/L   CO2 24 22 - 32 mmol/L   Glucose, Bld 108 (H) 65 - 99 mg/dL   BUN 13 6 - 20 mg/dL   Creatinine, Ser 0.69 0.61 - 1.24 mg/dL   Calcium 8.1 (L) 8.9 - 10.3 mg/dL   Total Protein 6.2 (L) 6.5 - 8.1 g/dL   Albumin 2.8 (L) 3.5 - 5.0 g/dL   AST 150 (H) 15 - 41 U/L   ALT 184 (H) 17 - 63 U/L   Alkaline Phosphatase 269 (H) 38 - 126 U/L   Total Bilirubin 5.0 (H) 0.3 - 1.2 mg/dL   GFR calc non Af Amer >60 >60 mL/min   GFR calc Af Amer >60 >60 mL/min    Comment: (NOTE) The eGFR has been calculated using the CKD EPI equation. This calculation has not been validated in all clinical situations. eGFR's persistently <60 mL/min signify possible Chronic Kidney Disease.    Anion gap 10 5 - 15    Dg Chest 2 View  Result Date: 06/03/2016 CLINICAL DATA:  75 year old male with fever and cough, pneumonia. EXAM: CHEST  2 VIEW COMPARISON:  06/02/2016 FINDINGS: The cardiomediastinal silhouette is unremarkable. Streaky lingular opacity is unchanged. Mild peribronchial thickening is again identified. Minimal bibasilar opacities/atelectasis again noted. There is no evidence of pneumothorax, pleural effusion or acute bony abnormality. IMPRESSION: Unchanged appearance of the chest with streaky lingular opacity and mild basilar opacity/atelectasis. Electronically Signed   By: Margarette Canada M.D.   On: 06/03/2016 16:27  Dg Chest 2 View  Result Date: 06/02/2016 CLINICAL DATA:  Fever and cough EXAM: CHEST  2 VIEW COMPARISON:  May 29, 2016 FINDINGS: Persistent lingular  opacity. There is also mild opacity in the right base which is unchanged. No other interval changes. IMPRESSION: Persistent infiltrates in the lingula and right base. Recommend follow-up to resolution. Electronically Signed   By: Dorise Bullion III M.D   On: 06/02/2016 16:51    Ct Abdomen Pelvis W Contrast  Result Date: 06/03/2016 CLINICAL DATA:  Chronic upper abdominal and back pain for weeks. Sepsis. EXAM: CT ABDOMEN AND PELVIS WITH CONTRAST TECHNIQUE: Multidetector CT imaging of the abdomen and pelvis was performed using the standard protocol following bolus administration of intravenous contrast. CONTRAST:  199m ISOVUE-300 IOPAMIDOL (ISOVUE-300) INJECTION 61% COMPARISON:  CT abdomen dated 09/16/2015. FINDINGS: Lower chest: Patchy small consolidations at each lung base, most likely atelectasis. Hepatobiliary: Significant thickening of the gallbladder walls, similar to the appearance on earlier CT abdomen of 09/16/2015. At least mildly increased pericholecystic edema. New common bile duct dilatation and prominent intrahepatic bile duct dilatation. Questionable small stones within the distal common bile duct. No focal mass or lesion within the liver. Pancreas: No mass, inflammatory changes, or other significant abnormality. Spleen: Within normal limits in size and appearance. Adrenals/Urinary Tract: Adrenal glands are bulbous in configuration, similar to previous exams, without circumscribed mass. Left renal cysts. No renal stone or hydronephrosis bilaterally. No perinephric inflammation. No ureteral or bladder calculi identified. Bladder is unremarkable, partially decompressed. Stomach/Bowel: Bowel is normal in caliber. No bowel wall thickening or evidence of bowel wall inflammation. Appendix is not seen but there are no inflammatory changes about the cecum to suggest acute appendicitis. Stomach appears normal. Vascular/Lymphatic: Heavy atherosclerotic changes of the normal caliber abdominal aorta, aortic branch vessels and pelvic vasculature. Small and mildly prominent lymph nodes noted within the upper abdomen. No enlarged lymph nodes within the lower abdomen or pelvis. Reproductive: No mass or other significant abnormality. Other: Trace free fluid in the lower pelvis. Trace  fluid at the lower margin of the liver. No abscess collections seen. No free intraperitoneal air. Musculoskeletal: No acute or suspicious osseous finding. Fixation hardware within the lumbar spine appears appropriately positioned. Superficial soft tissues are unremarkable for acute process. 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 IMPRESSION: 1. New common bile duct and intrahepatic bile duct dilatation. Questionable small stones within the distal common bile duct but not definitive. Recommend further characterization with ERCP or MRCP. 2. Prominent gallbladder wall thickening and pericholecystic edema. The gallbladder wall thickening is similar to findings on earlier CT abdomen of 09/16/2015, suggesting recurrent versus chronic cholecystitis. Pericholecystic edema is slightly more prominent on today's exam. 3. Mildly prominent lymph nodes within the upper abdomen, likely reactive in nature. 4. Aortic atherosclerosis. Additional chronic/incidental findings detailed above. These results were called by telephone at the time of interpretation on 06/03/2016 at 3:33 pm to Dr. PLala Lund, who verbally acknowledged these results. Electronically Signed   By: SFranki CabotM.D.   On: 06/03/2016 15:40  Mr 3d Recon At Scanner  Result Date: 06/03/2016 CLINICAL DATA:  Patient with new common bile duct dilatation. Evaluate for choledocholithiasis. EXAM: MRI ABDOMEN WITHOUT AND WITH CONTRAST (INCLUDING MRCP) TECHNIQUE: Multiplanar multisequence MR imaging of the abdomen was performed both before and after the administration of intravenous contrast. Heavily T2-weighted images of the biliary and pancreatic ducts were obtained, and three-dimensional MRCP images were rendered by post processing. CONTRAST:  190mMULTIHANCE GADOBENATE DIMEGLUMINE 529 MG/ML IV SOLN COMPARISON:  CT abdomen pelvis 06/03/2016; 09/16/2015 FINDINGS: Lower chest: Patchy consolidation within the lingula and right lower lobe. No pleural effusion. Normal heart  size. Hepatobiliary:  The liver is normal in size and contour. No focal hepatic lesion is identified. There is circumferential wall thickening of the gallbladder with mild surrounding edema. New intrahepatic and extrahepatic biliary ductal dilatation. Common bile duct measures up to 9 mm. There are a few tiny filling defects demonstrated within the common bile duct within the mid aspect and distal aspect (image 28; series 6) concerning for choledocholithiasis. Pancreas: Unremarkable. Spleen: Unremarkable Adrenals/Urinary Tract: Unchanged thickening of the bilateral adrenal glands. Kidneys enhance symmetrically with contrast. There is a 2.6 cm cyst within the interpolar region of the left kidney. Stomach/Bowel: No abnormal bowel wall thickening or evidence for bowel obstruction. Vascular/Lymphatic: Normal caliber abdominal aorta. Multiple enlarged porta hepatic and upper abdominal lymph nodes including a 1.4 cm porta hepatic lymph node (image 22; series 4). Other: None. Musculoskeletal: No aggressive or acute appearing osseous lesions. Lumbar spinal fusion hardware. IMPRESSION: Interval development of intrahepatic and extrahepatic biliary ductal dilatation. Multiple small filling defects are demonstrated within the common bile duct suggestive of choledocholithiasis. Additionally there is cholelithiasis as well as gallbladder wall thickening and small amount of associated pericholecystic edema concerning for acute cholecystitis. Nonspecific porta hepatic and upper abdominal adenopathy, potentially reactive in etiology. Recommend clinical and laboratory correlation. Patchy consolidation within the lingula and right lower lobe may represent infection or atelectasis. Continued radiographic followup to ensure resolution is recommended. Electronically Signed   By: Lovey Newcomer M.D.   On: 06/03/2016 20:05  Mr Jeananne Rama W/wo Cm/mrcp  Result Date: 06/03/2016 CLINICAL DATA:  Patient with new common bile duct dilatation. Evaluate  for choledocholithiasis. EXAM: MRI ABDOMEN WITHOUT AND WITH CONTRAST (INCLUDING MRCP) TECHNIQUE: Multiplanar multisequence MR imaging of the abdomen was performed both before and after the administration of intravenous contrast. Heavily T2-weighted images of the biliary and pancreatic ducts were obtained, and three-dimensional MRCP images were rendered by post processing. CONTRAST:  10m MULTIHANCE GADOBENATE DIMEGLUMINE 529 MG/ML IV SOLN COMPARISON:  CT abdomen pelvis 06/03/2016; 09/16/2015 FINDINGS: Lower chest: Patchy consolidation within the lingula and right lower lobe. No pleural effusion. Normal heart size. Hepatobiliary: The liver is normal in size and contour. No focal hepatic lesion is identified. There is circumferential wall thickening of the gallbladder with mild surrounding edema. New intrahepatic and extrahepatic biliary ductal dilatation. Common bile duct measures up to 9 mm. There are a few tiny filling defects demonstrated within the common bile duct within the mid aspect and distal aspect (image 28; series 6) concerning for choledocholithiasis. Pancreas: Unremarkable. Spleen: Unremarkable Adrenals/Urinary Tract: Unchanged thickening of the bilateral adrenal glands. Kidneys enhance symmetrically with contrast. There is a 2.6 cm cyst within the interpolar region of the left kidney. Stomach/Bowel: No abnormal bowel wall thickening or evidence for bowel obstruction. Vascular/Lymphatic: Normal caliber abdominal aorta. Multiple enlarged porta hepatic and upper abdominal lymph nodes including a 1.4 cm porta hepatic lymph node (image 22; series 4). Other: None. Musculoskeletal: No aggressive or acute appearing osseous lesions. Lumbar spinal fusion hardware. IMPRESSION: Interval development of intrahepatic and extrahepatic biliary ductal dilatation. Multiple small filling defects are demonstrated within the common bile duct suggestive of choledocholithiasis. Additionally there is cholelithiasis as well as  gallbladder wall thickening and small amount of associated pericholecystic edema concerning for acute cholecystitis. Nonspecific porta hepatic and upper abdominal adenopathy, potentially reactive in etiology. Recommend clinical and laboratory correlation. Patchy consolidation within the lingula and right lower lobe may represent infection or atelectasis. Continued radiographic followup to ensure resolution is recommended. Electronically Signed   By: DPolly CobiaD.  On: 06/03/2016 20:05   Review of Systems  Constitutional: Positive for fever. Negative for weight loss.  HENT: Negative for nosebleeds.   Eyes: Negative for blurred vision.  Respiratory: Positive for cough and sputum production. Negative for shortness of breath.   Cardiovascular: Negative for chest pain, palpitations, orthopnea and PND.       Denies DOE  Gastrointestinal: Positive for abdominal pain, nausea and vomiting.  Genitourinary: Negative for dysuria and hematuria.  Musculoskeletal: Negative.   Skin: Negative for itching and rash.       Chronic wound LLE  Neurological: Negative for dizziness, focal weakness, seizures, loss of consciousness and headaches.       Denies TIAs, amaurosis fugax  Endo/Heme/Allergies: Does not bruise/bleed easily.  Psychiatric/Behavioral: The patient is not nervous/anxious.    Blood pressure (!) 118/57, pulse 72, temperature 99.2 F (37.3 C), temperature source Oral, resp. rate 20, height 5' 4"  (1.626 m), weight 59 kg (130 lb), SpO2 97 %. Physical Exam  Vitals reviewed. Constitutional: He is oriented to person, place, and time. He appears well-developed and well-nourished. No distress.  HENT:  Head: Normocephalic and atraumatic.  Right Ear: External ear normal.  Left Ear: External ear normal.  Eyes: Conjunctivae are normal. Scleral icterus is present.  Neck: Normal range of motion. Neck supple. No tracheal deviation present. No thyromegaly present.  Cardiovascular: Normal rate and normal  heart sounds.   Respiratory: Effort normal and breath sounds normal. No stridor. No respiratory distress. He has no wheezes.  GI: Soft. He exhibits no distension. There is tenderness in the right upper quadrant. There is no rigidity, no rebound and no guarding.  Mild TTP in RUQ  Musculoskeletal: He exhibits no edema or tenderness.  Lymphadenopathy:    He has no cervical adenopathy.  Neurological: He is alert and oriented to person, place, and time. He exhibits normal muscle tone.  Skin: Skin is warm and dry. No rash noted. He is not diaphoretic. No erythema. No pallor.  Chronic wound on LLE - no cellulitis  Psychiatric: He has a normal mood and affect. His behavior is normal. Judgment and thought content normal.   Hepatic Function Latest Ref Rng & Units 06/04/2016 06/03/2016 06/02/2016  Total Protein 6.5 - 8.1 g/dL 6.2(L) 6.8 7.8  Albumin 3.5 - 5.0 g/dL 2.8(L) 3.2(L) 4.0  AST 15 - 41 U/L 150(H) 339(H) 21  ALT 17 - 63 U/L 184(H) 271(H) 28  Alk Phosphatase 38 - 126 U/L 269(H) 292(H) 116  Total Bilirubin 0.3 - 1.2 mg/dL 5.0(H) 4.4(H) 0.7  Bilirubin, Direct 0.1 - 0.5 mg/dL - 2.9(H) -    Assessment/Plan: Community-acquired pneumonia Acute on chronic cholecystitis Choledocholithiasis Peripheral arterial disease Coronary artery disease Hypertension Dyslipidemia  Needs antibiotics to cover gallbladder pathology. Even though his LFTs are decreasing there is radiological evidence of common bile duct stones. Recommend ERCP followed by laparoscopic cholecystectomy. GI consult is pending at this time. If ERCP is not able to be done today the patient can have a diet from our perspective.  Discussed gallbladder disease along with common bile duct stones with the patient. Discussed the typical hospital course.  We'll follow along  Leighton Ruff. Redmond Pulling, MD, FACS General, Bariatric, & Minimally Invasive Surgery [facility], [place]   [facility] M 06/04/2016, 11:15 AM

## 2016-06-04 NOTE — Evaluation (Signed)
SLP Cancellation Note  Patient Details Name: Thomas Ferrell MRN: [id number] DOB: 07-07-1941   Cancelled treatment:       Reason Eval/Treat Not Completed: Medical issues which prohibited therapy (pt remains npo )  Luanna Salk, [place] SLP [phone]

## 2016-06-04 NOTE — Progress Notes (Signed)
Physical Therapy Treatment Patient Details Name: Thomas Ferrell MRN: [id number] DOB: Aug 26, 1941 Today's Date: 06/04/2016    History of Present Illness Thomas Ferrell  is a 75 y.o. male,  With history of PAD status post bilateral femoral surgery by Dr. 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 Patient found to have CAP.    PT Comments    Pt on RA avg 97%.  Applied B shoes(L AFO) and assisted OOB to amb a great distance while monitoring sats.  AVG RA sats was 94% and HR 84.    Follow Up Recommendations  Home health PT;Supervision/Assistance - 24 hour     Equipment Recommendations  Rolling walker with 5" wheels (his walker at home is 4WW)    Recommendations for Other Services       Precautions / Restrictions Precautions Precautions: Fall Other Brace/Splint: has and AFO for L foot, very painful Left foot due to neuropathy Restrictions Weight Bearing Restrictions: No    Mobility  Bed Mobility      MinGuard/Supervision OOB with increased time.            Transfers    MinGuard/Supervision with 25% VC's on proper hand placement to push off and VC's for turn complwetion prior to sit                Ambulation/Gait    Min/MinGuard with RW 72 feet on RA.  Slight forward flex posture (s/p multiple back surgeries) with slight flex hips and knees.  Tolerated well.  Mild dyspnea.  RA avg 94%.  Used RW walker for increased stability.  Pt stated, he normally uses his cane but feels weaker.               Stairs            Wheelchair Mobility    Modified Rankin (Stroke Patients Only)       Balance                                    Cognition  Arousal/Alertness: Awake/alert Behavior During Therapy: WFL for tasks assessed/performed Overall Cognitive Status: Within Functional Limits for tasks assessed                      Exercises      General Comments        Pertinent Vitals/Pain Pain Assessment: Faces Faces Pain Scale: Hurts a little bit Pain Location: L foot Pain Descriptors / Indicators: Discomfort;Guarding Pain Intervention(s): Monitored during session    Home Living                      Prior Function            PT Goals (current goals can now be found in the care plan section) Progress towards PT goals: Progressing toward goals    Frequency  Min 3X/week    PT Plan Current plan remains appropriate    Co-evaluation             End of Session Equipment Utilized During Treatment: Gait belt Activity Tolerance: Patient tolerated treatment well Patient left: in bed;with bed  alarm set     Time: [id number] PT Time Calculation (min) (ACUTE ONLY): 27 min  Charges:  $Gait Training: 8-22 mins $Therapeutic Activity: 8-22 mins                    G Codes:      Rica Koyanagi  PTA [facility]  Acute  Rehab Pager      [phone]

## 2016-06-04 NOTE — Consult Note (Signed)
[facility] Consult Note  Referring Provider: No ref. provider found Primary Care Physician:  Orpah Melter, MD Primary Gastroenterologist:  Dr.  Laurel Dimmer Complaint: Elenore Rota pain nausea and vomiting HPI: Thomas Ferrell is an 75 y.o. white male  presented with epigastric abdominal pain nausea vomiting and fever subsequent found have gallstones and changes of cholecystitis on CT scan. Liver function test normal on admission began to rise and MRCP showed changes of cholecystitis as well as a dilated bile duct with several filling defects. He feels better today. His bilirubin was 5.0. He had fevers as high as 103 initially and has been placed on Zosyn and is afebrile today. White blood cell count has been normal.  Past Medical History:  Diagnosis Date  . Abnormality of gait 03/03/2013  . bladder ca dx'd 11/2009   chemo/xrt comp 02/2010  . Cerebrovascular disease    right brain CVA 2007  . COPD (chronic obstructive pulmonary disease) ([place])   . Cough productive of clear sputum    TX RECENT SINUS INFECTION   . CVA (cerebral vascular accident) ([place]) 2007   r-cva  . Dyslipidemia   . Hypertension   . Lumbago     Past Surgical History:  Procedure Laterality Date  . ABDOMINAL AORTAGRAM N/A 12/07/2013   Procedure: ABDOMINAL Maxcine Ham;  Surgeon: Angelia Mould, MD;  Location: [facility] CATH LAB;  Service: Cardiovascular;  Laterality: N/A;  . BACK SURGERY  2010  . bladder cancer    . CAROTID ARTERY ANGIOPLASTY  2009  . FEMORAL-FEMORAL BYPASS GRAFT N/A 12/09/2013   Procedure: BYPASS GRAFT FEMORAL-FEMORAL ARTERY WITH BILATERAL ENDARTERECTOMY ;  Surgeon: Elam Dutch, MD;  Location: [facility] OR;  Service: Vascular;  Laterality: N/A;  . FEMORAL-POPLITEAL BYPASS GRAFT Left 12/09/2013   Procedure: BYPASS GRAFT FEMORAL-POPLITEAL ARTERY - LEFT ;  Surgeon: Elam Dutch, MD;  Location: [facility];  Service: Vascular;  Laterality: Left;  . LAMINECTOMY  09/08/2013   L 2 L3 L4 L5       Dr Luiz Ochoa     Medications Prior to Admission  Medication Sig Dispense Refill  . acetaminophen (TYLENOL) 500 MG tablet Take 1,000 mg by mouth every 6 (six) hours as needed for mild pain or headache.     Marland Kitchen amLODipine (NORVASC) 10 MG tablet Take 10 mg by mouth daily.    Marland Kitchen aspirin EC 325 MG tablet Take 325 mg by mouth daily.    . cholecalciferol (VITAMIN D) 1000 units tablet Take 1,000 Units by mouth daily.    . feeding supplement, ENSURE COMPLETE, (ENSURE COMPLETE) LIQD Take 237 mLs by mouth 3 (three) times daily with meals.    . ferrous sulfate 325 (65 FE) MG EC tablet Take 325 mg by mouth daily with breakfast.    . lisinopril (PRINIVIL,ZESTRIL) 40 MG tablet Take 40 mg by mouth daily.  5  . oxyCODONE (ROXICODONE) 15 MG immediate release tablet Take 1 tablet (15 mg total) by mouth every 4 (four) hours as needed for pain. 15 tablet 0  . potassium chloride SA (K-DUR,KLOR-CON) 20 MEQ tablet Take 1 tablet (20 mEq total) by mouth daily. 3 tablet 0  . pregabalin (LYRICA) 75 MG capsule Take 75 mg by mouth 2 (two) times daily.    . sertraline (ZOLOFT) 100 MG tablet Take 100 mg by mouth daily.    . simvastatin (ZOCOR) 20 MG tablet Take 20 mg by mouth daily.      Allergies:  Allergies  Allergen Reactions  . Morphine And Related Itching  Family History  Problem Relation Age of Onset  . Stroke Mother   . Cancer Father   . Dementia Father   . Cancer Sister     Social History:  reports that he quit smoking about 10 years ago. His smoking use included Cigarettes. He has never used smokeless tobacco. He reports that he does not drink alcohol or use drugs.  Review of Systems: negative except As above   Blood pressure (!) 118/57, pulse 72, temperature 99.2 F (37.3 C), temperature source Oral, resp. rate 20, height 5' 4"  (1.626 m), weight 59 kg (130 lb), SpO2 97 %. Head: Normocephalic, without obvious abnormality, atraumatic Neck: no adenopathy, no carotid bruit, no JVD, supple, symmetrical, trachea  midline and thyroid not enlarged, symmetric, no tenderness/mass/nodules Resp: clear to auscultation bilaterally Cardio: regular rate and rhythm, S1, S2 normal, no murmur, click, rub or gallop GI: Soft currently nontender no hepatosplenomegaly mass or guarding Extremities: extremities normal, atraumatic, no cyanosis or edema  Results for orders placed or performed during the hospital encounter of 06/02/16 (from the past 48 hour(s))  Comprehensive metabolic panel     Status: Abnormal   Collection Time: 06/02/16  4:05 PM  Result Value Ref Range   Sodium 136 135 - 145 mmol/L   Potassium 3.6 3.5 - 5.1 mmol/L   Chloride 100 (L) 101 - 111 mmol/L   CO2 26 22 - 32 mmol/L   Glucose, Bld 144 (H) 65 - 99 mg/dL   BUN 8 6 - 20 mg/dL   Creatinine, Ser 0.83 0.61 - 1.24 mg/dL   Calcium 9.2 8.9 - 10.3 mg/dL   Total Protein 7.8 6.5 - 8.1 g/dL   Albumin 4.0 3.5 - 5.0 g/dL   AST 21 15 - 41 U/L   ALT 28 17 - 63 U/L   Alkaline Phosphatase 116 38 - 126 U/L   Total Bilirubin 0.7 0.3 - 1.2 mg/dL   GFR calc non Af Amer >60 >60 mL/min   GFR calc Af Amer >60 >60 mL/min    Comment: (NOTE) The eGFR has been calculated using the CKD EPI equation. This calculation has not been validated in all clinical situations. eGFR's persistently <60 mL/min signify possible Chronic Kidney Disease.    Anion gap 10 5 - 15  CBC with Differential     Status: Abnormal   Collection Time: 06/02/16  4:05 PM  Result Value Ref Range   WBC 15.3 (H) 4.0 - 10.5 K/uL   RBC 4.07 (L) 4.22 - 5.81 MIL/uL   Hemoglobin 12.3 (L) 13.0 - 17.0 g/dL   HCT 37.2 (L) 39.0 - 52.0 %   MCV 91.4 78.0 - 100.0 fL   MCH 30.2 26.0 - 34.0 pg   MCHC 33.1 30.0 - 36.0 g/dL   RDW 13.1 11.5 - 15.5 %   Platelets 246 150 - 400 K/uL   Neutrophils Relative % 93 %   Neutro Abs 14.2 (H) 1.7 - 7.7 K/uL   Lymphocytes Relative 4 %   Lymphs Abs 0.6 (L) 0.7 - 4.0 K/uL   Monocytes Relative 3 %   Monocytes Absolute 0.5 0.1 - 1.0 K/uL   Eosinophils Relative 0 %    Eosinophils Absolute 0.0 0.0 - 0.7 K/uL   Basophils Relative 0 %   Basophils Absolute 0.0 0.0 - 0.1 K/uL  Troponin I     Status: Abnormal   Collection Time: 06/02/16  4:05 PM  Result Value Ref Range   Troponin I 0.03 (HH) <0.03 ng/mL  Comment: CRITICAL RESULT CALLED TO, READ BACK BY AND VERIFIED WITH: BINGHAM,S AT 4:50PM ON 06/02/16 BY FESTERMAN,C   Culture, blood (Routine x 2)     Status: None (Preliminary result)   Collection Time: 06/02/16  4:10 PM  Result Value Ref Range   Specimen Description BLOOD RIGHT ARM  5 ML IN [facility] BOTTLE    Special Requests NONE    Culture      NO GROWTH < 24 HOURS Performed at [facility]    Report Status PENDING   Culture, blood (Routine x 2)     Status: None (Preliminary result)   Collection Time: 06/02/16  4:10 PM  Result Value Ref Range   Specimen Description BLOOD LEFT HAND  10 ML IN [facility] BOTTLE    Special Requests NONE    Culture      NO GROWTH < 24 HOURS Performed at [facility]    Report Status PENDING   I-Stat CG4 Lactic Acid, ED     Status: None   Collection Time: 06/02/16  4:15 PM  Result Value Ref Range   Lactic Acid, Venous 1.54 0.5 - 1.9 mmol/L  Urine culture     Status: None   Collection Time: 06/02/16  5:10 PM  Result Value Ref Range   Specimen Description URINE, CLEAN CATCH    Special Requests NONE    Culture NO GROWTH Performed at [facility]     Report Status 06/03/2016 FINAL   Urinalysis, Routine w reflex microscopic     Status: Abnormal   Collection Time: 06/02/16  5:10 PM  Result Value Ref Range   Color, Urine YELLOW YELLOW   APPearance CLEAR CLEAR   Specific Gravity, Urine 1.016 1.005 - 1.030   pH 6.0 5.0 - 8.0   Glucose, UA NEGATIVE NEGATIVE mg/dL   Hgb urine dipstick TRACE (A) NEGATIVE   Bilirubin Urine NEGATIVE NEGATIVE   Ketones, ur NEGATIVE NEGATIVE mg/dL   Protein, ur 30 (A) NEGATIVE mg/dL   Nitrite NEGATIVE NEGATIVE   Leukocytes, UA NEGATIVE NEGATIVE  Urine microscopic-add  on     Status: None   Collection Time: 06/02/16  5:10 PM  Result Value Ref Range   Squamous Epithelial / LPF NONE SEEN NONE SEEN   WBC, UA 0-5 0 - 5 WBC/hpf   RBC / HPF 0-5 0 - 5 RBC/hpf   Bacteria, UA NONE SEEN NONE SEEN  HIV antibody     Status: None   Collection Time: 06/02/16  6:15 PM  Result Value Ref Range   HIV Screen 4th Generation wRfx Non Reactive Non Reactive    Comment: (NOTE) Performed At: [facility] [address] [place], [place] [id number] Lindon Romp MD OQ:[id number]   Procalcitonin     Status: None   Collection Time: 06/02/16  6:15 PM  Result Value Ref Range   Procalcitonin 0.18 ng/mL    Comment:        Interpretation: PCT (Procalcitonin) <= 0.5 ng/mL: Systemic infection (sepsis) is not likely. Local bacterial infection is possible. (NOTE)         ICU PCT Algorithm               Non ICU PCT Algorithm    ----------------------------     ------------------------------         PCT < 0.25 ng/mL                 PCT < 0.1 ng/mL     Stopping of antibiotics  Stopping of antibiotics       strongly encouraged.               strongly encouraged.    ----------------------------     ------------------------------       PCT level decrease by               PCT < 0.25 ng/mL       >= 80% from peak PCT       OR PCT 0.25 - 0.5 ng/mL          Stopping of antibiotics                                             encouraged.     Stopping of antibiotics           encouraged.    ----------------------------     ------------------------------       PCT level decrease by              PCT >= 0.25 ng/mL       < 80% from peak PCT        AND PCT >= 0.5 ng/mL            Continuin g antibiotics                                              encouraged.       Continuing antibiotics            encouraged.    ----------------------------     ------------------------------     PCT level increase compared          PCT > 0.5 ng/mL         with peak PCT AND           PCT >= 0.5 ng/mL             Escalation of antibiotics                                          strongly encouraged.      Escalation of antibiotics        strongly encouraged.   Protime-INR     Status: Abnormal   Collection Time: 06/02/16  6:15 PM  Result Value Ref Range   Prothrombin Time 15.3 (H) 11.6 - 15.2 seconds   INR 1.20 0.00 - 1.49  APTT     Status: None   Collection Time: 06/02/16  6:15 PM  Result Value Ref Range   aPTT 36 24 - 37 seconds  Lactic acid, plasma     Status: None   Collection Time: 06/02/16  7:29 PM  Result Value Ref Range   Lactic Acid, Venous 0.8 0.5 - 1.9 mmol/L  Troponin I (q 6hr x 3)     Status: Abnormal   Collection Time: 06/02/16  7:35 PM  Result Value Ref Range   Troponin I 0.03 (HH) <0.03 ng/mL    Comment: CRITICAL VALUE NOTED.  VALUE IS CONSISTENT WITH PREVIOUSLY REPORTED AND CALLED VALUE.  CBC     Status: Abnormal   Collection Time: 06/03/16  1:15 AM  Result Value Ref Range   WBC 15.2 (H) 4.0 - 10.5 K/uL   RBC 3.76 (L) 4.22 - 5.81 MIL/uL   Hemoglobin 11.4 (L) 13.0 - 17.0 g/dL   HCT 34.4 (L) 39.0 - 52.0 %   MCV 91.5 78.0 - 100.0 fL   MCH 30.3 26.0 - 34.0 pg   MCHC 33.1 30.0 - 36.0 g/dL   RDW 13.3 11.5 - 15.5 %   Platelets 214 150 - 400 K/uL  Basic metabolic panel     Status: Abnormal   Collection Time: 06/03/16  1:15 AM  Result Value Ref Range   Sodium 135 135 - 145 mmol/L   Potassium 3.5 3.5 - 5.1 mmol/L   Chloride 102 101 - 111 mmol/L   CO2 23 22 - 32 mmol/L   Glucose, Bld 164 (H) 65 - 99 mg/dL   BUN 9 6 - 20 mg/dL   Creatinine, Ser 0.76 0.61 - 1.24 mg/dL   Calcium 8.5 (L) 8.9 - 10.3 mg/dL   GFR calc non Af Amer >60 >60 mL/min   GFR calc Af Amer >60 >60 mL/min    Comment: (NOTE) The eGFR has been calculated using the CKD EPI equation. This calculation has not been validated in all clinical situations. eGFR's persistently <60 mL/min signify possible Chronic Kidney Disease.    Anion gap 10 5 - 15  Lactic acid, plasma     Status:  None   Collection Time: 06/03/16  1:15 AM  Result Value Ref Range   Lactic Acid, Venous 0.8 0.5 - 1.9 mmol/L  Troponin I (q 6hr x 3)     Status: Abnormal   Collection Time: 06/03/16  1:15 AM  Result Value Ref Range   Troponin I 0.03 (HH) <0.03 ng/mL    Comment: CRITICAL VALUE NOTED.  VALUE IS CONSISTENT WITH PREVIOUSLY REPORTED AND CALLED VALUE.  Hepatic function panel     Status: Abnormal   Collection Time: 06/03/16  3:53 PM  Result Value Ref Range   Total Protein 6.8 6.5 - 8.1 g/dL   Albumin 3.2 (L) 3.5 - 5.0 g/dL   AST 339 (H) 15 - 41 U/L   ALT 271 (H) 17 - 63 U/L   Alkaline Phosphatase 292 (H) 38 - 126 U/L   Total Bilirubin 4.4 (H) 0.3 - 1.2 mg/dL   Bilirubin, Direct 2.9 (H) 0.1 - 0.5 mg/dL   Indirect Bilirubin 1.5 (H) 0.3 - 0.9 mg/dL  Protime-INR     Status: Abnormal   Collection Time: 06/03/16  3:53 PM  Result Value Ref Range   Prothrombin Time 17.8 (H) 11.6 - 15.2 seconds   INR 1.46 0.00 - 1.49  CBC     Status: Abnormal   Collection Time: 06/04/16  5:10 AM  Result Value Ref Range   WBC 11.6 (H) 4.0 - 10.5 K/uL   RBC 3.30 (L) 4.22 - 5.81 MIL/uL   Hemoglobin 9.9 (L) 13.0 - 17.0 g/dL   HCT 30.8 (L) 39.0 - 52.0 %   MCV 93.3 78.0 - 100.0 fL   MCH 30.0 26.0 - 34.0 pg   MCHC 32.1 30.0 - 36.0 g/dL   RDW 13.4 11.5 - 15.5 %   Platelets 183 150 - 400 K/uL  Comprehensive metabolic panel     Status: Abnormal   Collection Time: 06/04/16  5:10 AM  Result Value Ref Range   Sodium 135 135 - 145 mmol/L   Potassium 3.5 3.5 - 5.1 mmol/L   Chloride 101 101 - 111 mmol/L  CO2 24 22 - 32 mmol/L   Glucose, Bld 108 (H) 65 - 99 mg/dL   BUN 13 6 - 20 mg/dL   Creatinine, Ser 0.69 0.61 - 1.24 mg/dL   Calcium 8.1 (L) 8.9 - 10.3 mg/dL   Total Protein 6.2 (L) 6.5 - 8.1 g/dL   Albumin 2.8 (L) 3.5 - 5.0 g/dL   AST 150 (H) 15 - 41 U/L   ALT 184 (H) 17 - 63 U/L   Alkaline Phosphatase 269 (H) 38 - 126 U/L   Total Bilirubin 5.0 (H) 0.3 - 1.2 mg/dL   GFR calc non Af Amer >60 >60 mL/min   GFR  calc Af Amer >60 >60 mL/min    Comment: (NOTE) The eGFR has been calculated using the CKD EPI equation. This calculation has not been validated in all clinical situations. eGFR's persistently <60 mL/min signify possible Chronic Kidney Disease.    Anion gap 10 5 - 15   Dg Chest 2 View  Result Date: 06/03/2016 CLINICAL DATA:  75 year old male with fever and cough, pneumonia. EXAM: CHEST  2 VIEW COMPARISON:  06/02/2016 FINDINGS: The cardiomediastinal silhouette is unremarkable. Streaky lingular opacity is unchanged. Mild peribronchial thickening is again identified. Minimal bibasilar opacities/atelectasis again noted. There is no evidence of pneumothorax, pleural effusion or acute bony abnormality. IMPRESSION: Unchanged appearance of the chest with streaky lingular opacity and mild basilar opacity/atelectasis. Electronically Signed   By: Margarette Canada M.D.   On: 06/03/2016 16:27  Dg Chest 2 View  Result Date: 06/02/2016 CLINICAL DATA:  Fever and cough EXAM: CHEST  2 VIEW COMPARISON:  May 29, 2016 FINDINGS: Persistent lingular opacity. There is also mild opacity in the right base which is unchanged. No other interval changes. IMPRESSION: Persistent infiltrates in the lingula and right base. Recommend follow-up to resolution. Electronically Signed   By: Dorise Bullion III M.D   On: 06/02/2016 16:51   Ct Abdomen Pelvis W Contrast  Result Date: 06/03/2016 CLINICAL DATA:  Chronic upper abdominal and back pain for weeks. Sepsis. EXAM: CT ABDOMEN AND PELVIS WITH CONTRAST TECHNIQUE: Multidetector CT imaging of the abdomen and pelvis was performed using the standard protocol following bolus administration of intravenous contrast. CONTRAST:  170m ISOVUE-300 IOPAMIDOL (ISOVUE-300) INJECTION 61% COMPARISON:  CT abdomen dated 09/16/2015. FINDINGS: Lower chest: Patchy small consolidations at each lung base, most likely atelectasis. Hepatobiliary: Significant thickening of the gallbladder walls, similar to the  appearance on earlier CT abdomen of 09/16/2015. At least mildly increased pericholecystic edema. New common bile duct dilatation and prominent intrahepatic bile duct dilatation. Questionable small stones within the distal common bile duct. No focal mass or lesion within the liver. Pancreas: No mass, inflammatory changes, or other significant abnormality. Spleen: Within normal limits in size and appearance. Adrenals/Urinary Tract: Adrenal glands are bulbous in configuration, similar to previous exams, without circumscribed mass. Left renal cysts. No renal stone or hydronephrosis bilaterally. No perinephric inflammation. No ureteral or bladder calculi identified. Bladder is unremarkable, partially decompressed. Stomach/Bowel: Bowel is normal in caliber. No bowel wall thickening or evidence of bowel wall inflammation. Appendix is not seen but there are no inflammatory changes about the cecum to suggest acute appendicitis. Stomach appears normal. Vascular/Lymphatic: Heavy atherosclerotic changes of the normal caliber abdominal aorta, aortic branch vessels and pelvic vasculature. Small and mildly prominent lymph nodes noted within the upper abdomen. No enlarged lymph nodes within the lower abdomen or pelvis. Reproductive: No mass or other significant abnormality. Other: Trace free fluid in the lower pelvis. Trace fluid  at the lower margin of the liver. No abscess collections seen. No free intraperitoneal air. Musculoskeletal: No acute or suspicious osseous finding. Fixation hardware within the lumbar spine appears appropriately positioned. Superficial soft tissues are unremarkable for acute process. 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 IMPRESSION: 1. New common bile duct and intrahepatic bile duct dilatation. Questionable small stones within the distal common bile duct but not definitive. Recommend further characterization with ERCP or MRCP. 2. Prominent gallbladder wall thickening and pericholecystic edema. The gallbladder  wall thickening is similar to findings on earlier CT abdomen of 09/16/2015, suggesting recurrent versus chronic cholecystitis. Pericholecystic edema is slightly more prominent on today's exam. 3. Mildly prominent lymph nodes within the upper abdomen, likely reactive in nature. 4. Aortic atherosclerosis. Additional chronic/incidental findings detailed above. These results were called by telephone at the time of interpretation on 06/03/2016 at 3:33 pm to Dr. Lala Lund , who verbally acknowledged these results. Electronically Signed   By: Franki Cabot M.D.   On: 06/03/2016 15:40  Mr 3d Recon At Scanner  Result Date: 06/03/2016 CLINICAL DATA:  Patient with new common bile duct dilatation. Evaluate for choledocholithiasis. EXAM: MRI ABDOMEN WITHOUT AND WITH CONTRAST (INCLUDING MRCP) TECHNIQUE: Multiplanar multisequence MR imaging of the abdomen was performed both before and after the administration of intravenous contrast. Heavily T2-weighted images of the biliary and pancreatic ducts were obtained, and three-dimensional MRCP images were rendered by post processing. CONTRAST:  21m MULTIHANCE GADOBENATE DIMEGLUMINE 529 MG/ML IV SOLN COMPARISON:  CT abdomen pelvis 06/03/2016; 09/16/2015 FINDINGS: Lower chest: Patchy consolidation within the lingula and right lower lobe. No pleural effusion. Normal heart size. Hepatobiliary: The liver is normal in size and contour. No focal hepatic lesion is identified. There is circumferential wall thickening of the gallbladder with mild surrounding edema. New intrahepatic and extrahepatic biliary ductal dilatation. Common bile duct measures up to 9 mm. There are a few tiny filling defects demonstrated within the common bile duct within the mid aspect and distal aspect (image 28; series 6) concerning for choledocholithiasis. Pancreas: Unremarkable. Spleen: Unremarkable Adrenals/Urinary Tract: Unchanged thickening of the bilateral adrenal glands. Kidneys enhance symmetrically with  contrast. There is a 2.6 cm cyst within the interpolar region of the left kidney. Stomach/Bowel: No abnormal bowel wall thickening or evidence for bowel obstruction. Vascular/Lymphatic: Normal caliber abdominal aorta. Multiple enlarged porta hepatic and upper abdominal lymph nodes including a 1.4 cm porta hepatic lymph node (image 22; series 4). Other: None. Musculoskeletal: No aggressive or acute appearing osseous lesions. Lumbar spinal fusion hardware. IMPRESSION: Interval development of intrahepatic and extrahepatic biliary ductal dilatation. Multiple small filling defects are demonstrated within the common bile duct suggestive of choledocholithiasis. Additionally there is cholelithiasis as well as gallbladder wall thickening and small amount of associated pericholecystic edema concerning for acute cholecystitis. Nonspecific porta hepatic and upper abdominal adenopathy, potentially reactive in etiology. Recommend clinical and laboratory correlation. Patchy consolidation within the lingula and right lower lobe may represent infection or atelectasis. Continued radiographic followup to ensure resolution is recommended. Electronically Signed   By: DLovey NewcomerM.D.   On: 06/03/2016 20:05  Mr AJeananne RamaW/wo Cm/mrcp  Result Date: 06/03/2016 CLINICAL DATA:  Patient with new common bile duct dilatation. Evaluate for choledocholithiasis. EXAM: MRI ABDOMEN WITHOUT AND WITH CONTRAST (INCLUDING MRCP) TECHNIQUE: Multiplanar multisequence MR imaging of the abdomen was performed both before and after the administration of intravenous contrast. Heavily T2-weighted images of the biliary and pancreatic ducts were obtained, and three-dimensional MRCP images were rendered by post processing. CONTRAST:  60m MULTIHANCE GADOBENATE DIMEGLUMINE 529 MG/ML IV SOLN COMPARISON:  CT abdomen pelvis 06/03/2016; 09/16/2015 FINDINGS: Lower chest: Patchy consolidation within the lingula and right lower lobe. No pleural effusion. Normal heart size.  Hepatobiliary: The liver is normal in size and contour. No focal hepatic lesion is identified. There is circumferential wall thickening of the gallbladder with mild surrounding edema. New intrahepatic and extrahepatic biliary ductal dilatation. Common bile duct measures up to 9 mm. There are a few tiny filling defects demonstrated within the common bile duct within the mid aspect and distal aspect (image 28; series 6) concerning for choledocholithiasis. Pancreas: Unremarkable. Spleen: Unremarkable Adrenals/Urinary Tract: Unchanged thickening of the bilateral adrenal glands. Kidneys enhance symmetrically with contrast. There is a 2.6 cm cyst within the interpolar region of the left kidney. Stomach/Bowel: No abnormal bowel wall thickening or evidence for bowel obstruction. Vascular/Lymphatic: Normal caliber abdominal aorta. Multiple enlarged porta hepatic and upper abdominal lymph nodes including a 1.4 cm porta hepatic lymph node (image 22; series 4). Other: None. Musculoskeletal: No aggressive or acute appearing osseous lesions. Lumbar spinal fusion hardware. IMPRESSION: Interval development of intrahepatic and extrahepatic biliary ductal dilatation. Multiple small filling defects are demonstrated within the common bile duct suggestive of choledocholithiasis. Additionally there is cholelithiasis as well as gallbladder wall thickening and small amount of associated pericholecystic edema concerning for acute cholecystitis. Nonspecific porta hepatic and upper abdominal adenopathy, potentially reactive in etiology. Recommend clinical and laboratory correlation. Patchy consolidation within the lingula and right lower lobe may represent infection or atelectasis. Continued radiographic followup to ensure resolution is recommended. Electronically Signed   By: DLovey NewcomerM.D.   On: 06/03/2016 20:05   Assessment: 1. Cholecystitis 2. Choledocholithiasis, rule out cholangitis Plan:  Agree with preoperative ERCP prior to  cholecystectomy. Risks rationale and alternatives explained to the patient he wished to proceed. We'll try to arrange for tomorrow. Vega Stare C 06/04/2016, 12:06 PM  Pager [phone] no answer or after 5 PM call [phone]

## 2016-06-05 ENCOUNTER — Encounter (HOSPITAL_COMMUNITY)
Admission: EM | Disposition: A | Discharge status: Home Health | Payer: Self-pay | Source: Home / Self Care | Attending: Internal Medicine

## 2016-06-05 ENCOUNTER — Inpatient Hospital Stay (HOSPITAL_COMMUNITY): Payer: Medicare Other

## 2016-06-05 ENCOUNTER — Inpatient Hospital Stay (HOSPITAL_COMMUNITY): Payer: Medicare Other | Admitting: Anesthesiology

## 2016-06-05 ENCOUNTER — Encounter (HOSPITAL_COMMUNITY): Payer: Self-pay

## 2016-06-05 DIAGNOSIS — R1010 Upper abdominal pain, unspecified: Secondary | ICD-10-CM

## 2016-06-05 DIAGNOSIS — R109 Unspecified abdominal pain: Secondary | ICD-10-CM

## 2016-06-05 DIAGNOSIS — K831 Obstruction of bile duct: Secondary | ICD-10-CM

## 2016-06-05 HISTORY — PX: ESOPHAGOGASTRODUODENOSCOPY (EGD) WITH PROPOFOL: SHX5813

## 2016-06-05 HISTORY — PX: ERCP: SHX5425

## 2016-06-05 LAB — COMPREHENSIVE METABOLIC PANEL
ALBUMIN: 2.9 g/dL — AB (ref 3.5–5.0)
ALT: 134 U/L — ABNORMAL HIGH (ref 17–63)
ANION GAP: 7 (ref 5–15)
AST: 85 U/L — ABNORMAL HIGH (ref 15–41)
Alkaline Phosphatase: 314 U/L — ABNORMAL HIGH (ref 38–126)
BILIRUBIN TOTAL: 2.4 mg/dL — AB (ref 0.3–1.2)
BUN: 13 mg/dL (ref 6–20)
CO2: 25 mmol/L (ref 22–32)
Calcium: 8.3 mg/dL — ABNORMAL LOW (ref 8.9–10.3)
Chloride: 106 mmol/L (ref 101–111)
Creatinine, Ser: 0.55 mg/dL — ABNORMAL LOW (ref 0.61–1.24)
GFR calc Af Amer: >60 mL/min (ref >60)
GFR calc non Af Amer: >60 mL/min (ref >60)
GLUCOSE: 112 mg/dL — AB (ref 65–99)
POTASSIUM: 3.2 mmol/L — AB (ref 3.5–5.1)
Sodium: 138 mmol/L (ref 135–145)
TOTAL PROTEIN: 6.6 g/dL (ref 6.5–8.1)

## 2016-06-05 LAB — PROTIME-INR
INR: 1.3 (ref 0.00–1.49)
PROTHROMBIN TIME: 15.9 s — AB (ref 11.6–15.2)

## 2016-06-05 LAB — CBC
HEMATOCRIT: 31 % — AB (ref 39.0–52.0)
Hemoglobin: 10.4 g/dL — ABNORMAL LOW (ref 13.0–17.0)
MCH: 30.8 pg (ref 26.0–34.0)
MCHC: 33.5 g/dL (ref 30.0–36.0)
MCV: 91.7 fL (ref 78.0–100.0)
PLATELETS: 221 10*3/uL (ref 150–400)
RBC: 3.38 MIL/uL — ABNORMAL LOW (ref 4.22–5.81)
RDW: 13.8 % (ref 11.5–15.5)
WBC: 8.6 10*3/uL (ref 4.0–10.5)

## 2016-06-05 LAB — VANCOMYCIN, TROUGH: Vancomycin Tr: 7 ug/mL — ABNORMAL LOW (ref 15–20)

## 2016-06-05 LAB — LEGIONELLA PNEUMOPHILA SEROGP 1 UR AG: L. pneumophila Serogp 1 Ur Ag: NEGATIVE

## 2016-06-05 LAB — C DIFFICILE QUICK SCREEN W PCR REFLEX
C Diff antigen: NEGATIVE
C Diff interpretation: NOT DETECTED
C Diff toxin: NEGATIVE

## 2016-06-05 SURGERY — ERCP, WITH INTERVENTION IF INDICATED
Anesthesia: General

## 2016-06-05 MED ORDER — PROPOFOL 10 MG/ML IV BOLUS
INTRAVENOUS | Status: AC
  Filled 2016-06-05: qty 20

## 2016-06-05 MED ORDER — GLUCAGON HCL RDNA (DIAGNOSTIC) 1 MG IJ SOLR
INTRAMUSCULAR | Status: AC
  Filled 2016-06-05: qty 1

## 2016-06-05 MED ORDER — ONDANSETRON HCL 4 MG/2ML IJ SOLN
INTRAMUSCULAR | Status: AC
  Filled 2016-06-05: qty 2

## 2016-06-05 MED ORDER — ONDANSETRON HCL 4 MG/2ML IJ SOLN
INTRAMUSCULAR | Status: DC | PRN
  Administered 2016-06-05: 4 mg via INTRAVENOUS

## 2016-06-05 MED ORDER — LIDOCAINE HCL (CARDIAC) 20 MG/ML IV SOLN
INTRAVENOUS | Status: AC
  Filled 2016-06-05: qty 5

## 2016-06-05 MED ORDER — LIDOCAINE HCL (CARDIAC) 20 MG/ML IV SOLN
INTRAVENOUS | Status: DC | PRN
  Administered 2016-06-05: 80 mg via INTRAVENOUS

## 2016-06-05 MED ORDER — PROPOFOL 10 MG/ML IV BOLUS
INTRAVENOUS | Status: DC | PRN
  Administered 2016-06-05: 30 mg via INTRAVENOUS
  Administered 2016-06-05: 120 mg via INTRAVENOUS

## 2016-06-05 MED ORDER — FENTANYL CITRATE (PF) 100 MCG/2ML IJ SOLN
INTRAMUSCULAR | Status: AC
  Filled 2016-06-05: qty 2

## 2016-06-05 MED ORDER — ONDANSETRON HCL 4 MG/2ML IJ SOLN
4.0000 mg | Freq: Once | INTRAMUSCULAR | Status: AC
  Administered 2016-06-05: 4 mg via INTRAVENOUS

## 2016-06-05 MED ORDER — SODIUM CHLORIDE 0.9 % IV SOLN
INTRAVENOUS | Status: DC | PRN
  Administered 2016-06-05: 30 mL

## 2016-06-05 MED ORDER — POTASSIUM CHLORIDE 10 MEQ/100ML IV SOLN
10.0000 meq | INTRAVENOUS | Status: AC
  Administered 2016-06-05 (×3): 10 meq via INTRAVENOUS
  Filled 2016-06-05 (×3): qty 100

## 2016-06-05 MED ORDER — FENTANYL CITRATE (PF) 100 MCG/2ML IJ SOLN
INTRAMUSCULAR | Status: DC | PRN
  Administered 2016-06-05 (×2): 50 ug via INTRAVENOUS

## 2016-06-05 MED ORDER — SUCCINYLCHOLINE CHLORIDE 20 MG/ML IJ SOLN
INTRAMUSCULAR | Status: DC | PRN
  Administered 2016-06-05: 100 mg via INTRAVENOUS

## 2016-06-05 MED ORDER — VANCOMYCIN HCL IN DEXTROSE 750-5 MG/150ML-% IV SOLN
750.0000 mg | Freq: Two times a day (BID) | INTRAVENOUS | Status: DC
  Administered 2016-06-05 – 2016-06-06 (×2): 750 mg via INTRAVENOUS
  Filled 2016-06-05 (×2): qty 150

## 2016-06-05 MED ORDER — LACTATED RINGERS IV SOLN
INTRAVENOUS | Status: DC
  Administered 2016-06-05 (×2): via INTRAVENOUS

## 2016-06-05 NOTE — Anesthesia Preprocedure Evaluation (Addendum)
Anesthesia Evaluation  Patient identified by MRN, date of birth, ID band Patient awake    Reviewed: Allergy & Precautions, NPO status , Patient's Chart, lab work & pertinent test results  Airway Mallampati: II  TM Distance: >3 FB Neck ROM: Full    Dental no notable dental hx.    Pulmonary COPD, former smoker,    Pulmonary exam normal breath sounds clear to auscultation       Cardiovascular hypertension, + Peripheral Vascular Disease  Normal cardiovascular exam Rhythm:Regular Rate:Normal  Mild LVH with LVEF 55-60%. Grade 1 diastolic dysfunction with   normal estimated LV filling pressure. MAC with trivial mitral   regurgitation. Moderately sclerotic aortic valve as outlined   above. Trivial tricuspid regurgitation with PASP 38 mmHg. Mildly   dilated RV with preserved contraction. 06/03/16   Neuro/Psych CVA negative psych ROS   GI/Hepatic negative GI ROS, Neg liver ROS,   Endo/Other  negative endocrine ROS  Renal/GU negative Renal ROS  negative genitourinary   Musculoskeletal negative musculoskeletal ROS (+)   Abdominal   Peds negative pediatric ROS (+)  Hematology negative hematology ROS (+)   Anesthesia Other Findings   Reproductive/Obstetrics negative OB ROS                             Anesthesia Physical Anesthesia Plan  ASA: III  Anesthesia Plan: General   Post-op Pain Management:    Induction: Intravenous  Airway Management Planned: Oral ETT  Additional Equipment:   Intra-op Plan:   Post-operative Plan: Extubation in OR  Informed Consent: I have reviewed the patients History and Physical, chart, labs and discussed the procedure including the risks, benefits and alternatives for the proposed anesthesia with the patient or authorized representative who has indicated his/her understanding and acceptance.   Dental advisory given  Plan Discussed with: CRNA and  Surgeon  Anesthesia Plan Comments:         Anesthesia Quick Evaluation

## 2016-06-05 NOTE — Transfer of Care (Signed)
Immediate Anesthesia Transfer of Care Note  Patient: Thomas Ferrell  Procedure(s) Performed: Procedure(s): ENDOSCOPIC RETROGRADE CHOLANGIOPANCREATOGRAPHY (ERCP) (N/A)  Patient Location: PACU  Anesthesia Type:General  Level of Consciousness: awake, alert  and oriented  Airway & Oxygen Therapy: Patient Spontanous Breathing and Patient connected to face mask oxygen  Post-op Assessment: Report given to RN and Post -op Vital signs reviewed and stable  Post vital signs: Reviewed and stable  Last Vitals:  Vitals:   06/05/16 0559 06/05/16 1058  BP: (!) 150/59 (!) 179/60  Pulse: 71 63  Resp: 17 20  Temp: 36.8 C     Last Pain:  Vitals:   06/05/16 0946  TempSrc:   PainSc: 0-No pain      Patients Stated Pain Goal: 3 ([id number])  Complications: No apparent anesthesia complications

## 2016-06-05 NOTE — Progress Notes (Signed)
PROGRESS NOTE                                                                                                                                                                                                             Patient Demographics:    Thomas Ferrell, is a 75 y.o. male, DOB - Feb 02, 1941, LD:[id number]  Admit date - 06/02/2016   Admitting Physician Thurnell Lose, MD  Outpatient Primary MD for the patient is Orpah Melter, MD  LOS - 3  Chief Complaint  Patient presents with  . Pneumonia       Brief Narrative     Thomas Ferrell  is a 75 y.o. male,  With history of PAD status post bilateral femoral surgery by Dr. 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 He was placed on azithromycin for 5 days which gave him diarrhea but his main symptoms persisted.  Came to the ER where chest x-ray showed right-sided infiltrate, he had signs of sepsis with temperature of 103 and leukocytosis, I was called to admit the patient for pneumonia causing sepsis. Besides above review of systems patient is stable, he appears nontoxic, denies any chest or abdominal pain. No new focal weakness. Does have diarrhea but no blood or mucus in stool.   Subjective:    [name] today has, No headache, abd pain is better - No Nausea, No new weakness tingling or numbness, No Cough - SOB.     Assessment  & Plan :      1. Sepsis due to community-acquired pneumonia which has failed azithromycin and possibly due to #2 below. Has persistent infiltrate in the right lower lobe, denies any  episodes of choking or problems with swallowing food or liquids. However wife says that he does cough from time to time while eating. He will be admitted to a telemetry bed, sepsis protocol initiated, blood and sputum culture, strep pneumonia and Legionella antigen to be checked. Since he has failed azithromycin will be placed  on Doxy, Vancomycin and Cefepime. Monitor cultures. Supportive care with oxygen and nebulizer treatments. Flutter valve added and requested to sit in chair.  2. Possible subacute to chronic cholangitis with choledocholithiasis and ascending cholangitis Chronic low back pain, now vague Abd pain x 2 weeks.  Admission liver enzymes were stable, next day of admission he had some abdominal discomfort and liver enzymes spiked, and abdomen and MRCP abdomen confirms CBD stone with possible cholecystitis, continue bowel rest with Zosyn. GI following and likely due for ERCP on 06/05/2016 followed by cholecystectomy later for which surgery has been consulted as well. Clinically he feels better on antibiotics.  3. Prolonged QTC. Coreg, IV magnesium and telemetry monitor. QTc now improved 451 ms.  4. COPD. Stable no wheezing supportive care  5. Stroke. With poor baseline balance. Cane when ambulates.  6. Few coughing episodes while swallowing. Speech eval pending.  7. Dyslipidemia. On statin.  8. Essential hypertension. Place on low-dose Coreg and as needed hydralazine, hold ACE inhibitor due to sepsis.  9. Azithromycin induced diarrhea - resolved, no BM in > 14hrs after admission.  10. Sepsis induced mild Trop leak - B blocker, ASA, pain free, EKG non acute, flat Troponin trend.  11. ? Murmur - appreciated by ER MD - i could not, TTE stable .  12. Leukocytosis - resolved.  13. Hypokalemia - replaced.    Family Communication  :  Wife  Code Status :  Full  Diet : Heart Healthy  Disposition Plan  :  Stay inpt  Consults  :  None  Procedures  :    CT chest -  lung nodule + R.infilterate  TTE - Mild LVH with LVEF 55-60%. Grade 1 diastolic dysfunction with normal estimated LV filling pressure. MAC with trivial mitral regurgitation. Moderately sclerotic aortic valve as outlined  above. Trivial tricuspid regurgitation with PASP 38 mmHg. Mildly  dilated RV with preserved contraction.  CT Abd - Pelvis - Possible subacute to chronic cholecystitis with CBD stone  MRI Abdomen/MRCP. Possible subacute to chronic cholecystitis with CBD stone   DVT Prophylaxis  :  Lovenox   Lab Results  Component Value Date   PLT 221 06/05/2016    Inpatient Medications  Scheduled Meds: . amLODipine  10 mg Oral Daily  . aspirin EC  325 mg Oral Daily  . carvedilol  3.125 mg Oral BID WC  . diatrizoate meglumine-sodium  30 mL Oral Once  . doxycycline  100 mg Oral Q12H  . enoxaparin (LOVENOX) injection  40 mg Subcutaneous Daily  . feeding supplement (ENSURE ENLIVE)  237 mL Oral TID WC  . piperacillin-tazobactam (ZOSYN)  IV  3.375 g Intravenous Q8H  . potassium chloride  10 mEq Intravenous Q1 Hr x 4  . pregabalin  75 mg Oral Daily  . sertraline  100 mg Oral Daily  . simvastatin  20 mg Oral q1800  . sodium chloride  1,000 mL Intravenous Once  . vancomycin  500 mg Intravenous Q12H   Continuous Infusions: . sodium chloride Stopped (06/05/16 0816)   PRN Meds:.acetaminophen, albuterol, guaiFENesin-dextromethorphan, hydrALAZINE, LORazepam, ondansetron (ZOFRAN) IV, oxyCODONE  Antibiotics  :    Anti-infectives    Start     Dose/Rate Route Frequency Ordered Stop   06/03/16 2300  piperacillin-tazobactam (ZOSYN) IVPB 3.375 g     3.375 g 12.5 mL/hr over 240 Minutes Intravenous Every 8 hours 06/03/16 1620     06/03/16 2200  doxycycline (VIBRA-TABS) tablet 100 mg     100 mg Oral Every 12  hours 06/03/16 1502     06/03/16 1700  piperacillin-tazobactam (ZOSYN) IVPB 3.375 g     3.375 g 100 mL/hr over 30 Minutes Intravenous  Once 06/03/16 1620 06/03/16 1714   06/03/16 1130   doxycycline (VIBRA-TABS) tablet 100 mg  Status:  Discontinued     100 mg Oral Daily 06/03/16 1036 06/03/16 1502   06/03/16 0600  vancomycin (VANCOCIN) 500 mg in sodium chloride 0.9 % 100 mL IVPB     500 mg 100 mL/hr over 60 Minutes Intravenous Every 12 hours 06/02/16 1826     06/02/16 2000  ceFEPIme (MAXIPIME) 1 g in dextrose 5 % 50 mL IVPB  Status:  Discontinued     1 g 100 mL/hr over 30 Minutes Intravenous Every 8 hours 06/02/16 1747 06/03/16 1544   06/02/16 1830  vancomycin (VANCOCIN) IVPB 1000 mg/200 mL premix     1,000 mg 200 mL/hr over 60 Minutes Intravenous  Once 06/02/16 1816 06/03/16 0020   06/02/16 1630  cefTRIAXone (ROCEPHIN) 1 g in dextrose 5 % 50 mL IVPB     1 g 100 mL/hr over 30 Minutes Intravenous  Once 06/02/16 1622 06/02/16 1700   06/02/16 1630  doxycycline (VIBRA-TABS) tablet 100 mg     100 mg Oral  Once 06/02/16 1622 06/02/16 1640         Objective:   Vitals:   06/04/16 0525 06/04/16 1700 06/04/16 2225 06/05/16 0559  BP: (!) 118/57 128/61 (!) 124/51 (!) 150/59  Pulse: 72 65 60 71  Resp: 20  17 17   Temp: 99.2 F (37.3 C) 98.4 F (36.9 C) 98.1 F (36.7 C) 98.3 F (36.8 C)  TempSrc: Oral Oral Oral Oral  SpO2: 97% 95% 94% 94%  Weight:      Height:        Wt Readings from Last 3 Encounters:  06/02/16 59 kg (130 lb)  05/29/16 59 kg (130 lb)  02/02/16 56.7 kg (125 lb)     Intake/Output Summary (Last 24 hours) at 06/05/16 0953 Last data filed at 06/05/16 0946  Gross per 24 hour  Intake              455 ml  Output             1650 ml  Net            -1195 ml     Physical Exam  Awake Alert, Oriented X 3, No new F.N deficits, Normal affect [place].AT,PERRAL Supple Neck,No JVD, No cervical lymphadenopathy appriciated.  Symmetrical Chest wall movement, Good air movement bilaterally, CTAB RRR,No Gallops,Rubs or new Murmurs, No Parasternal Heave +ve B.Sounds, Abd Soft, No tenderness, No organomegaly appriciated, No rebound - guarding or rigidity. No  Cyanosis, Clubbing or edema, No new Rash or bruise      Data Review:    CBC  Recent Labs Lab 06/02/16 1605 06/03/16 0115 06/04/16 0510 06/05/16 0515  WBC 15.3* 15.2* 11.6* 8.6  HGB 12.3* 11.4* 9.9* 10.4*  HCT 37.2* 34.4* 30.8* 31.0*  PLT 246 214 183 221  MCV 91.4 91.5 93.3 91.7  MCH 30.2 30.3 30.0 30.8  MCHC 33.1 33.1 32.1 33.5  RDW 13.1 13.3 13.4 13.8  LYMPHSABS 0.6*  --   --   --   MONOABS 0.5  --   --   --   EOSABS 0.0  --   --   --   BASOSABS 0.0  --   --   --     Chemistries  Recent Labs Lab 06/02/16 1605 06/03/16 0115 06/03/16 1553 06/04/16 0510 06/05/16 0515  NA 136 135  --  135 138  K 3.6 3.5  --  3.5 3.2*  CL 100* 102  --  101 106  CO2 26 23  --  24 25  GLUCOSE 144* 164*  --  108* 112*  BUN 8 9  --  13 13  CREATININE 0.83 0.76  --  0.69 0.55*  CALCIUM 9.2 8.5*  --  8.1* 8.3*  AST 21  --  339* 150* 85*  ALT 28  --  271* 184* 134*  ALKPHOS 116  --  292* 269* 314*  BILITOT 0.7  --  4.4* 5.0* 2.4*   ------------------------------------------------------------------------------------------------------------------ No results for input(s): CHOL, HDL, LDLCALC, TRIG, CHOLHDL, LDLDIRECT in the last 72 hours.  Lab Results  Component Value Date   HGBA1C  01/30/2011    5.6 (NOTE)                                                                       According to the ADA Clinical Practice Recommendations for 2011, when HbA1c is used as a screening test:   >=6.5%   Diagnostic of Diabetes Mellitus           (if abnormal result  is confirmed)  5.7-6.4%   Increased risk of developing Diabetes Mellitus  References:Diagnosis and Classification of Diabetes Mellitus,Diabetes Care,2011,34(Suppl 1):S62-S69 and Standards of Medical Care in         Diabetes - 2011,Diabetes [id number]  (Suppl 1):S11-S61.   ------------------------------------------------------------------------------------------------------------------ No results for input(s): TSH, T4TOTAL, T3FREE,  THYROIDAB in the last 72 hours.  Invalid input(s): FREET3 ------------------------------------------------------------------------------------------------------------------ No results for input(s): VITAMINB12, FOLATE, FERRITIN, TIBC, IRON, RETICCTPCT in the last 72 hours.  Coagulation profile  Recent Labs Lab 06/02/16 1815 06/03/16 1553  INR 1.20 1.46    No results for input(s): DDIMER in the last 72 hours.  Cardiac Enzymes  Recent Labs Lab 06/02/16 1605 06/02/16 1935 06/03/16 0115  TROPONINI 0.03* 0.03* 0.03*   ------------------------------------------------------------------------------------------------------------------    Component Value Date/Time   BNP 92.3 05/29/2016 [id number]    Micro Results Recent Results (from the past 240 hour(s))  Blood culture (routine x 2)     Status: None   Collection Time: 05/29/16  8:50 AM  Result Value Ref Range Status   Specimen Description BLOOD LEFT ANTECUBITAL  Final   Special Requests BOTTLES DRAWN AEROBIC AND ANAEROBIC 5CC  Final   Culture   Final    NO GROWTH 5 DAYS Performed at [facility]    Report Status 06/03/2016 FINAL  Final  Blood culture (routine x 2)     Status: None   Collection Time: 05/29/16  8:54 AM  Result Value Ref Range Status   Specimen Description BLOOD RIGHT ANTECUBITAL  Final   Special Requests BOTTLES DRAWN AEROBIC AND ANAEROBIC 5ML  Final   Culture   Final    NO GROWTH 5 DAYS Performed at [facility]    Report Status 06/03/2016 FINAL  Final  Culture, blood (Routine x 2)     Status: None (Preliminary result)   Collection Time: 06/02/16  4:10 PM  Result Value Ref Range Status   Specimen Description BLOOD RIGHT  ARM  5 ML IN [facility] BOTTLE  Final   Special Requests NONE  Final   Culture   Final    NO GROWTH 2 DAYS Performed at [facility]    Report Status PENDING  Incomplete  Culture, blood (Routine x 2)     Status: None (Preliminary result)   Collection Time: 06/02/16  4:10  PM  Result Value Ref Range Status   Specimen Description BLOOD LEFT HAND  10 ML IN [facility] BOTTLE  Final   Special Requests NONE  Final   Culture   Final    NO GROWTH 2 DAYS Performed at [facility]    Report Status PENDING  Incomplete  Urine culture     Status: None   Collection Time: 06/02/16  5:10 PM  Result Value Ref Range Status   Specimen Description URINE, CLEAN CATCH  Final   Special Requests NONE  Final   Culture NO GROWTH Performed at [facility]   Final   Report Status 06/03/2016 FINAL  Final  Nasal culture     Status: None (Preliminary result)   Collection Time: 06/03/16  7:50 PM  Result Value Ref Range Status   Specimen Description NOSE  Final   Special Requests NONE  Final   Culture   Final    TOO YOUNG TO READ Performed at [facility]    Report Status PENDING  Incomplete  C difficile quick scan w PCR reflex     Status: None   Collection Time: 06/05/16  6:52 AM  Result Value Ref Range Status   C Diff antigen NEGATIVE NEGATIVE Final   C Diff toxin NEGATIVE NEGATIVE Final   C Diff interpretation No C. difficile detected.  Final    Radiology Reports Dg Chest 2 View  Result Date: 06/03/2016 CLINICAL DATA:  75 year old male with fever and cough, pneumonia. EXAM: CHEST  2 VIEW COMPARISON:  06/02/2016 FINDINGS: The cardiomediastinal silhouette is unremarkable. Streaky lingular opacity is unchanged. Mild peribronchial thickening is again identified. Minimal bibasilar opacities/atelectasis again noted. There is no evidence of pneumothorax, pleural effusion or acute bony abnormality. IMPRESSION: Unchanged appearance of the chest with streaky lingular opacity and mild basilar opacity/atelectasis. Electronically Signed   By: Margarette Canada M.D.   On: 06/03/2016 16:27  Dg Chest 2 View  Result Date: 06/02/2016 CLINICAL DATA:  Fever and cough EXAM: CHEST  2 VIEW COMPARISON:  May 29, 2016 FINDINGS: Persistent lingular opacity. There is also mild opacity in  the right base which is unchanged. No other interval changes. IMPRESSION: Persistent infiltrates in the lingula and right base. Recommend follow-up to resolution. Electronically Signed   By: Dorise Bullion III M.D   On: 06/02/2016 16:51   Dg Chest 2 View  Result Date: 05/29/2016 CLINICAL DATA:  Chest pain EXAM: CHEST  2 VIEW COMPARISON:  12/22/2013 FINDINGS: There is a new rounded opacity near the left hilum with neighboring atelectasis or scar. There is no edema, consolidation, effusion, or pneumothorax. Chronic bronchitic markings. Normal heart size and aortic contours. Aortic atherosclerosis. IMPRESSION: 1. Left perihilar nodule.  Recommend chest CT. 2. History of COPD.  Stable bronchitic markings. Electronically Signed   By: Monte Fantasia M.D.   On: 05/29/2016 06:38   Ct Abdomen Pelvis W Contrast  Result Date: 06/03/2016 CLINICAL DATA:  Chronic upper abdominal and back pain for weeks. Sepsis. EXAM: CT ABDOMEN AND PELVIS WITH CONTRAST TECHNIQUE: Multidetector CT imaging of the abdomen and pelvis was performed using the standard protocol  following bolus administration of intravenous contrast. CONTRAST:  148mL ISOVUE-300 IOPAMIDOL (ISOVUE-300) INJECTION 61% COMPARISON:  CT abdomen dated 09/16/2015. FINDINGS: Lower chest: Patchy small consolidations at each lung base, most likely atelectasis. Hepatobiliary: Significant thickening of the gallbladder walls, similar to the appearance on earlier CT abdomen of 09/16/2015. At least mildly increased pericholecystic edema. New common bile duct dilatation and prominent intrahepatic bile duct dilatation. Questionable small stones within the distal common bile duct. No focal mass or lesion within the liver. Pancreas: No mass, inflammatory changes, or other significant abnormality. Spleen: Within normal limits in size and appearance. Adrenals/Urinary Tract: Adrenal glands are bulbous in configuration, similar to previous exams, without circumscribed mass. Left renal  cysts. No renal stone or hydronephrosis bilaterally. No perinephric inflammation. No ureteral or bladder calculi identified. Bladder is unremarkable, partially decompressed. Stomach/Bowel: Bowel is normal in caliber. No bowel wall thickening or evidence of bowel wall inflammation. Appendix is not seen but there are no inflammatory changes about the cecum to suggest acute appendicitis. Stomach appears normal. Vascular/Lymphatic: Heavy atherosclerotic changes of the normal caliber abdominal aorta, aortic branch vessels and pelvic vasculature. Small and mildly prominent lymph nodes noted within the upper abdomen. No enlarged lymph nodes within the lower abdomen or pelvis. Reproductive: No mass or other significant abnormality. Other: Trace free fluid in the lower pelvis. Trace fluid at the lower margin of the liver. No abscess collections seen. No free intraperitoneal air. Musculoskeletal: No acute or suspicious osseous finding. Fixation hardware within the lumbar spine appears appropriately positioned. Superficial soft tissues are unremarkable for acute process. 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 IMPRESSION: 1. New common bile duct and intrahepatic bile duct dilatation. Questionable small stones within the distal common bile duct but not definitive. Recommend further characterization with ERCP or MRCP. 2. Prominent gallbladder wall thickening and pericholecystic edema. The gallbladder wall thickening is similar to findings on earlier CT abdomen of 09/16/2015, suggesting recurrent versus chronic cholecystitis. Pericholecystic edema is slightly more prominent on today's exam. 3. Mildly prominent lymph nodes within the upper abdomen, likely reactive in nature. 4. Aortic atherosclerosis. Additional chronic/incidental findings detailed above. These results were called by telephone at the time of interpretation on 06/03/2016 at 3:33 pm to Dr. Lala Lund , who verbally acknowledged these results. Electronically Signed   By:  Franki Cabot M.D.   On: 06/03/2016 15:40  Mr 3d Recon At Scanner  Result Date: 06/03/2016 CLINICAL DATA:  Patient with new common bile duct dilatation. Evaluate for choledocholithiasis. EXAM: MRI ABDOMEN WITHOUT AND WITH CONTRAST (INCLUDING MRCP) TECHNIQUE: Multiplanar multisequence MR imaging of the abdomen was performed both before and after the administration of intravenous contrast. Heavily T2-weighted images of the biliary and pancreatic ducts were obtained, and three-dimensional MRCP images were rendered by post processing. CONTRAST:  34mL MULTIHANCE GADOBENATE DIMEGLUMINE 529 MG/ML IV SOLN COMPARISON:  CT abdomen pelvis 06/03/2016; 09/16/2015 FINDINGS: Lower chest: Patchy consolidation within the lingula and right lower lobe. No pleural effusion. Normal heart size. Hepatobiliary: The liver is normal in size and contour. No focal hepatic lesion is identified. There is circumferential wall thickening of the gallbladder with mild surrounding edema. New intrahepatic and extrahepatic biliary ductal dilatation. Common bile duct measures up to 9 mm. There are a few tiny filling defects demonstrated within the common bile duct within the mid aspect and distal aspect (image 28; series 6) concerning for choledocholithiasis. Pancreas: Unremarkable. Spleen: Unremarkable Adrenals/Urinary Tract: Unchanged thickening of the bilateral adrenal glands. Kidneys enhance symmetrically with contrast. There is a 2.6 cm  cyst within the interpolar region of the left kidney. Stomach/Bowel: No abnormal bowel wall thickening or evidence for bowel obstruction. Vascular/Lymphatic: Normal caliber abdominal aorta. Multiple enlarged porta hepatic and upper abdominal lymph nodes including a 1.4 cm porta hepatic lymph node (image 22; series 4). Other: None. Musculoskeletal: No aggressive or acute appearing osseous lesions. Lumbar spinal fusion hardware. IMPRESSION: Interval development of intrahepatic and extrahepatic biliary ductal  dilatation. Multiple small filling defects are demonstrated within the common bile duct suggestive of choledocholithiasis. Additionally there is cholelithiasis as well as gallbladder wall thickening and small amount of associated pericholecystic edema concerning for acute cholecystitis. Nonspecific porta hepatic and upper abdominal adenopathy, potentially reactive in etiology. Recommend clinical and laboratory correlation. Patchy consolidation within the lingula and right lower lobe may represent infection or atelectasis. Continued radiographic followup to ensure resolution is recommended. Electronically Signed   By: Lovey Newcomer M.D.   On: 06/03/2016 20:05  Mr Jeananne Rama W/wo Cm/mrcp  Result Date: 06/03/2016 CLINICAL DATA:  Patient with new common bile duct dilatation. Evaluate for choledocholithiasis. EXAM: MRI ABDOMEN WITHOUT AND WITH CONTRAST (INCLUDING MRCP) TECHNIQUE: Multiplanar multisequence MR imaging of the abdomen was performed both before and after the administration of intravenous contrast. Heavily T2-weighted images of the biliary and pancreatic ducts were obtained, and three-dimensional MRCP images were rendered by post processing. CONTRAST:  84mL MULTIHANCE GADOBENATE DIMEGLUMINE 529 MG/ML IV SOLN COMPARISON:  CT abdomen pelvis 06/03/2016; 09/16/2015 FINDINGS: Lower chest: Patchy consolidation within the lingula and right lower lobe. No pleural effusion. Normal heart size. Hepatobiliary: The liver is normal in size and contour. No focal hepatic lesion is identified. There is circumferential wall thickening of the gallbladder with mild surrounding edema. New intrahepatic and extrahepatic biliary ductal dilatation. Common bile duct measures up to 9 mm. There are a few tiny filling defects demonstrated within the common bile duct within the mid aspect and distal aspect (image 28; series 6) concerning for choledocholithiasis. Pancreas: Unremarkable. Spleen: Unremarkable Adrenals/Urinary Tract: Unchanged  thickening of the bilateral adrenal glands. Kidneys enhance symmetrically with contrast. There is a 2.6 cm cyst within the interpolar region of the left kidney. Stomach/Bowel: No abnormal bowel wall thickening or evidence for bowel obstruction. Vascular/Lymphatic: Normal caliber abdominal aorta. Multiple enlarged porta hepatic and upper abdominal lymph nodes including a 1.4 cm porta hepatic lymph node (image 22; series 4). Other: None. Musculoskeletal: No aggressive or acute appearing osseous lesions. Lumbar spinal fusion hardware. IMPRESSION: Interval development of intrahepatic and extrahepatic biliary ductal dilatation. Multiple small filling defects are demonstrated within the common bile duct suggestive of choledocholithiasis. Additionally there is cholelithiasis as well as gallbladder wall thickening and small amount of associated pericholecystic edema concerning for acute cholecystitis. Nonspecific porta hepatic and upper abdominal adenopathy, potentially reactive in etiology. Recommend clinical and laboratory correlation. Patchy consolidation within the lingula and right lower lobe may represent infection or atelectasis. Continued radiographic followup to ensure resolution is recommended. Electronically Signed   By: Lovey Newcomer M.D.   On: 06/03/2016 20:05  Ct Angio Chest Aorta W/cm &/or Wo/cm  Result Date: 05/29/2016 CLINICAL DATA:  Chest pain starting 3 a.m., nausea, abnormal chest x-ray, history of bladder cancer EXAM: CT ANGIOGRAPHY CHEST without and WITH CONTRAST TECHNIQUE: Multidetector CT imaging of the chest was performed using the standard protocol during bolus administration of intravenous contrast. Multiplanar CT image reconstructions and MIPs were obtained to evaluate the vascular anatomy. Unenhanced images of the chest were provided. CONTRAST:  100 cc Isovue COMPARISON:  CT chest 03/21/2010 FINDINGS: Cardiovascular:  Unenhanced images shows atherosclerotic calcifications of thoracic aorta and  coronary arteries. Atherosclerotic calcifications are noted upper abdominal aorta, SMA origin, os celiac trunk origin and bilateral renal artery origin. Heart size within normal limits. No pericardial effusion. Arterial phase enhanced images demonstrates no evidence of aortic aneurysm or aortic dissection. The study is of excellent technical quality. No pulmonary embolus is noted. Pulmonary veins are unremarkable. Mediastinum/Nodes: There is no mediastinal hematoma or adenopathy. No hilar adenopathy is noted. Lungs/Pleura: Images of the lung parenchyma shows bilateral emphysematous changes especially in upper lobes. As noted on the chest x-ray there is triangular shape consolidation in lingula anteromedial with some air bronchogram. Some mucus bronchial plugging is noted in axial image 44. Findings are highly suspicious for small focal pneumonia. Additional focal infiltrate/ pneumonia noted in right middle lobe medially and laterally please see axial image 70. Axial image 69 there is 6 mm nodule in left base/left lower lobe anteriorly. Upper Abdomen: The visualized upper abdomen shows stable thickening bilateral adrenal glands left greater than right. There is fatty infiltration of the liver. No calcified gallstones are noted within visualize gallbladder. Visualized pancreas and spleen is unremarkable. Visualized upper kidneys are unremarkable. Musculoskeletal: Sagittal images of the spine shows degenerative changes thoracic spine. Sagittal view of the sternum is unremarkable. Review of the MIP images confirms the above findings. IMPRESSION: 1. No aortic aneurysm or aortic dissection.  No pulmonary embolus. 2. Atherosclerotic calcifications of thoracic aorta, abdominal aorta, celiac trunk and SMA origin, bilateral renal artery origin. Atherosclerotic calcifications of coronary arteries. 3. No mediastinal hematoma or adenopathy. There is small somewhat triangular-shaped consolidation in lingula anteromedially with  some air bronchogram. Findings highly suspicious for focal pneumonia. Additional focal infiltrate/pneumonia noted in right middle lobe laterally and medially. Follow-up to resolution after treatment is recommended. 4. There is 6 mm nodule in left base anteriorly/ left lower lobe. Non-contrast chest CT at 6-12 months is recommended. If the nodule is stable at time of repeat CT, then future CT at 18-24 months (from today's scan) is considered optional for low-risk patients, but is recommended for high-risk patients. This recommendation follows the consensus statement: Guidelines for Management of Incidental Pulmonary Nodules Detected on CT Images:From the Fleischner Society 2017; published online before print (10.1148/radiol.IJ:2314499). 5. Bilateral emphysematous changes are noted. 6. Fatty infiltration of the liver. Stable bilateral thickening of adrenal glands. Electronically Signed   By: Lahoma Crocker M.D.   On: 05/29/2016 08:07    Time Spent in minutes  30   SINGH,PRASHANT K M.D on 06/05/2016 at 9:53 AM  Between 7am to 7pm - Pager - [phone]  After 7pm go to [email or web address] - password [facility]  [facility] -  Office  [phone]

## 2016-06-05 NOTE — Anesthesia Postprocedure Evaluation (Signed)
Anesthesia Post Note  Patient: Thomas Ferrell  Procedure(s) Performed: Procedure(s) (LRB): ENDOSCOPIC RETROGRADE CHOLANGIOPANCREATOGRAPHY (ERCP) (N/A) ESOPHAGOGASTRODUODENOSCOPY (EGD) (N/A)  Patient location during evaluation: PACU Anesthesia Type: General Level of consciousness: awake and alert Pain management: pain level controlled Vital Signs Assessment: post-procedure vital signs reviewed and stable Respiratory status: spontaneous breathing, nonlabored ventilation, respiratory function stable and patient connected to nasal cannula oxygen Cardiovascular status: blood pressure returned to baseline and stable Postop Assessment: no signs of nausea or vomiting Anesthetic complications: no    Last Vitals:  Vitals:   06/05/16 1058 06/05/16 1330  BP: (!) 179/60 (!) 148/51  Pulse: 63 (!) 54  Resp: 20 19  Temp:  36.7 C    Last Pain:  Vitals:   06/05/16 1330  TempSrc: Oral  PainSc:                  [name] S

## 2016-06-05 NOTE — Anesthesia Procedure Notes (Signed)
Procedure Name: Intubation Date/Time: 06/05/2016 12:04 PM Performed by: Glory Buff Pre-anesthesia Checklist: Patient identified, Emergency Drugs available, Suction available and Patient being monitored Patient Re-evaluated:Patient Re-evaluated prior to inductionOxygen Delivery Method: Circle system utilized Preoxygenation: Pre-oxygenation with 100% oxygen Intubation Type: IV induction Ventilation: Mask ventilation without difficulty Laryngoscope Size: Miller and 3 Grade View: Grade I Tube type: Oral Tube size: 7.5 mm Number of attempts: 1 Airway Equipment and Method: Stylet and Oral airway Placement Confirmation: ETT inserted through vocal cords under direct vision,  positive ETCO2 and breath sounds checked- equal and bilateral Secured at: 21 cm Tube secured with: Tape Dental Injury: Teeth and Oropharynx as per pre-operative assessment

## 2016-06-05 NOTE — Op Note (Addendum)
[facility] Patient Name: Thomas Ferrell Procedure Date: 06/05/2016 MRN: [id number] Attending MD: Missy Sabins , MD Date of Birth: Aug 07, 1941 CSN: [id number] Age: 75 Admit Type: Inpatient Procedure:                ERCP Indications:              Evaluation and possible treatment of bile duct                            stone(s) Providers:                Elyse Jarvis. Amedeo Plenty, MD, Cleda Daub, RN, Ralene Bathe,                            Technician, Rosario Adie, CRNA Referring MD:              Medicines:                General Anesthesia Complications:            No immediate complications. Estimated Blood Loss:     Estimated blood loss was minimal. Procedure:                Pre-Anesthesia Assessment:                           - Prior to the procedure, a History and Physical                            was performed, and patient medications and                            allergies were reviewed. The patient's tolerance of                            previous anesthesia was also reviewed. The risks                            and benefits of the procedure and the sedation                            options and risks were discussed with the patient.                            All questions were answered, and informed consent                            was obtained. Prior Anticoagulants: The patient has                            taken no previous anticoagulant or antiplatelet                            agents. ASA Grade Assessment: III - A patient with  severe systemic disease. After reviewing the risks                            and benefits, the patient was deemed in                            satisfactory condition to undergo the procedure.                           After obtaining informed consent, the scope was                            passed under direct vision. Throughout the                            procedure, the patient's blood pressure,  pulse, and                            oxygen saturations were monitored continuously. The                            [id number]) scope was introduced through                            the mouth, and used to inject contrast into and                            used to inject contrast into the bile duct. The                            [id number]) scope was introduced through                            the and used to inject contrast into. The [id number]                            [id number]) scope was introduced through the and used                            to inject contrast into. The ERCP was accomplished                            without difficulty. The patient tolerated the                            procedure well. Scope In: 12:22:31 PM Scope Out: 1:12:46 PM Total Procedure Duration: 0 hours 50 minutes 15 seconds  Findings:      The scope was passed under direct vision through the upper GI tract. A       benign-appearing, intrinsic mild stenosis was found at the pylorus. This       was non-traversed. Dilation of pylorus with [12Size] dilator was       successful. The bile duct was deeply cannulated. Contrast was injected.  I personally interpreted the bile duct images. Ductal flow of contrast       was adequate. Image quality was adequate. Contrast extended to the       entire biliary tree. Opacification of the main bile duct was successful.       The maximum diameter of the ducts was 10 mm. The lower third of the main       bile duct contained two stones, the largest of which was 5 mm in       diameter. The biliary tree was otherwise normal. An 8 mm biliary       sphincterotomy was made with a traction (standard) sphincterotome using       ERBE electrocautery. The sphincterotomy oozed blood. The biliary tree       was swept with a 15 mm balloon starting at the bifurcation. Two stones       were removed. No stones remained. Impression:               - Gastric  stenosis was found at the pylorus.                           - Choledocholithiasis was found. Complete removal                            was accomplished by biliary sphincterotomy and                            balloon extraction.                           - This was successfully dilated.                           - A biliary sphincterotomy was performed.                           - The biliary tree was swept. Moderate Sedation:      Moderate (conscious) sedation was. The following parameters were       monitored: oxygen saturation, heart rate, blood pressure, and response       to care. Recommendation:           - Observe patient's clinical course.                           - Surgical consultation for consideration of                            cholecystectomy at appointment to be scheduled. Procedure Code(s):        --- Professional ---                           [phone], Endoscopic retrograde                            cholangiopancreatography (ERCP); with removal of                            calculi/debris from biliary/pancreatic duct(s)  43262, Endoscopic retrograde                            cholangiopancreatography (ERCP); with                            sphincterotomy/papillotomy Diagnosis Code(s):        --- Professional ---                           K31.1, Adult hypertrophic pyloric stenosis                           K80.50, Calculus of bile duct without cholangitis                            or cholecystitis without obstruction CPT copyright 2016 American Medical Association. All rights reserved. The codes documented in this report are preliminary and upon coder review may  be revised to meet current compliance requirements. Missy Sabins, MD 06/05/2016 1:24:20 PM This report has been signed electronically. Number of Addenda: 0

## 2016-06-05 NOTE — Progress Notes (Signed)
Subjective: Denies abdominal pain this AM.  Having diarrhea.  Objective: Vital signs in last 24 hours: Temp:  [98.1 F (36.7 C)-98.4 F (36.9 C)] 98.3 F (36.8 C) (07/25 0559) Pulse Rate:  [60-71] 71 (07/25 0559) Resp:  [17] 17 (07/25 0559) BP: (124-150)/(51-61) 150/59 (07/25 0559) SpO2:  [94 %-95 %] 94 % (07/25 0559) Last BM Date: 06/04/16  Intake/Output from previous day: 07/24 0701 - 07/25 0700 In: 455 [P.O.:120; I.V.:285; IV Piggyback:50] Out: 1250 [Urine:1250] Intake/Output this shift: No intake/output data recorded.  PE: General- In NAD Abdomen-soft, not tender  Lab Results:   Recent Labs  06/04/16 0510 06/05/16 0515  WBC 11.6* 8.6  HGB 9.9* 10.4*  HCT 30.8* 31.0*  PLT 183 221   BMET  Recent Labs  06/04/16 0510 06/05/16 0515  NA 135 138  K 3.5 3.2*  CL 101 106  CO2 24 25  GLUCOSE 108* 112*  BUN 13 13  CREATININE 0.69 0.55*  CALCIUM 8.1* 8.3*   PT/INR  Recent Labs  06/02/16 1815 06/03/16 1553  LABPROT 15.3* 17.8*  INR 1.20 1.46   Comprehensive Metabolic Panel:    Component Value Date/Time   NA 138 06/05/2016 0515   NA 135 06/04/2016 0510   K 3.2 (L) 06/05/2016 0515   K 3.5 06/04/2016 0510   CL 106 06/05/2016 0515   CL 101 06/04/2016 0510   CO2 25 06/05/2016 0515   CO2 24 06/04/2016 0510   BUN 13 06/05/2016 0515   BUN 13 06/04/2016 0510   CREATININE 0.55 (L) 06/05/2016 0515   CREATININE 0.69 06/04/2016 0510   GLUCOSE 112 (H) 06/05/2016 0515   GLUCOSE 108 (H) 06/04/2016 0510   CALCIUM 8.3 (L) 06/05/2016 0515   CALCIUM 8.1 (L) 06/04/2016 0510   AST 85 (H) 06/05/2016 0515   AST 150 (H) 06/04/2016 0510   ALT 134 (H) 06/05/2016 0515   ALT 184 (H) 06/04/2016 0510   ALKPHOS 314 (H) 06/05/2016 0515   ALKPHOS 269 (H) 06/04/2016 0510   BILITOT 2.4 (H) 06/05/2016 0515   BILITOT 5.0 (H) 06/04/2016 0510   PROT 6.6 06/05/2016 0515   PROT 6.2 (L) 06/04/2016 0510   ALBUMIN 2.9 (L) 06/05/2016 0515   ALBUMIN 2.8 (L) 06/04/2016 0510      Studies/Results: Dg Chest 2 View  Result Date: 06/03/2016 CLINICAL DATA:  75 year old male with fever and cough, pneumonia. EXAM: CHEST  2 VIEW COMPARISON:  06/02/2016 FINDINGS: The cardiomediastinal silhouette is unremarkable. Streaky lingular opacity is unchanged. Mild peribronchial thickening is again identified. Minimal bibasilar opacities/atelectasis again noted. There is no evidence of pneumothorax, pleural effusion or acute bony abnormality. IMPRESSION: Unchanged appearance of the chest with streaky lingular opacity and mild basilar opacity/atelectasis. Electronically Signed   By: Margarette Canada M.D.   On: 06/03/2016 16:27  Ct Abdomen Pelvis W Contrast  Result Date: 06/03/2016 CLINICAL DATA:  Chronic upper abdominal and back pain for weeks. Sepsis. EXAM: CT ABDOMEN AND PELVIS WITH CONTRAST TECHNIQUE: Multidetector CT imaging of the abdomen and pelvis was performed using the standard protocol following bolus administration of intravenous contrast. CONTRAST:  121mL ISOVUE-300 IOPAMIDOL (ISOVUE-300) INJECTION 61% COMPARISON:  CT abdomen dated 09/16/2015. FINDINGS: Lower chest: Patchy small consolidations at each lung base, most likely atelectasis. Hepatobiliary: Significant thickening of the gallbladder walls, similar to the appearance on earlier CT abdomen of 09/16/2015. At least mildly increased pericholecystic edema. New common bile duct dilatation and prominent intrahepatic bile duct dilatation. Questionable small stones within the distal common bile duct. No focal mass  or lesion within the liver. Pancreas: No mass, inflammatory changes, or other significant abnormality. Spleen: Within normal limits in size and appearance. Adrenals/Urinary Tract: Adrenal glands are bulbous in configuration, similar to previous exams, without circumscribed mass. Left renal cysts. No renal stone or hydronephrosis bilaterally. No perinephric inflammation. No ureteral or bladder calculi identified. Bladder is  unremarkable, partially decompressed. Stomach/Bowel: Bowel is normal in caliber. No bowel wall thickening or evidence of bowel wall inflammation. Appendix is not seen but there are no inflammatory changes about the cecum to suggest acute appendicitis. Stomach appears normal. Vascular/Lymphatic: Heavy atherosclerotic changes of the normal caliber abdominal aorta, aortic branch vessels and pelvic vasculature. Small and mildly prominent lymph nodes noted within the upper abdomen. No enlarged lymph nodes within the lower abdomen or pelvis. Reproductive: No mass or other significant abnormality. Other: Trace free fluid in the lower pelvis. Trace fluid at the lower margin of the liver. No abscess collections seen. No free intraperitoneal air. Musculoskeletal: No acute or suspicious osseous finding. Fixation hardware within the lumbar spine appears appropriately positioned. Superficial soft tissues are unremarkable for acute process. 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 IMPRESSION: 1. New common bile duct and intrahepatic bile duct dilatation. Questionable small stones within the distal common bile duct but not definitive. Recommend further characterization with ERCP or MRCP. 2. Prominent gallbladder wall thickening and pericholecystic edema. The gallbladder wall thickening is similar to findings on earlier CT abdomen of 09/16/2015, suggesting recurrent versus chronic cholecystitis. Pericholecystic edema is slightly more prominent on today's exam. 3. Mildly prominent lymph nodes within the upper abdomen, likely reactive in nature. 4. Aortic atherosclerosis. Additional chronic/incidental findings detailed above. These results were called by telephone at the time of interpretation on 06/03/2016 at 3:33 pm to Dr. Lala Lund , who verbally acknowledged these results. Electronically Signed   By: Franki Cabot M.D.   On: 06/03/2016 15:40  Mr 3d Recon At Scanner  Result Date: 06/03/2016 CLINICAL DATA:  Patient with new common  bile duct dilatation. Evaluate for choledocholithiasis. EXAM: MRI ABDOMEN WITHOUT AND WITH CONTRAST (INCLUDING MRCP) TECHNIQUE: Multiplanar multisequence MR imaging of the abdomen was performed both before and after the administration of intravenous contrast. Heavily T2-weighted images of the biliary and pancreatic ducts were obtained, and three-dimensional MRCP images were rendered by post processing. CONTRAST:  56mL MULTIHANCE GADOBENATE DIMEGLUMINE 529 MG/ML IV SOLN COMPARISON:  CT abdomen pelvis 06/03/2016; 09/16/2015 FINDINGS: Lower chest: Patchy consolidation within the lingula and right lower lobe. No pleural effusion. Normal heart size. Hepatobiliary: The liver is normal in size and contour. No focal hepatic lesion is identified. There is circumferential wall thickening of the gallbladder with mild surrounding edema. New intrahepatic and extrahepatic biliary ductal dilatation. Common bile duct measures up to 9 mm. There are a few tiny filling defects demonstrated within the common bile duct within the mid aspect and distal aspect (image 28; series 6) concerning for choledocholithiasis. Pancreas: Unremarkable. Spleen: Unremarkable Adrenals/Urinary Tract: Unchanged thickening of the bilateral adrenal glands. Kidneys enhance symmetrically with contrast. There is a 2.6 cm cyst within the interpolar region of the left kidney. Stomach/Bowel: No abnormal bowel wall thickening or evidence for bowel obstruction. Vascular/Lymphatic: Normal caliber abdominal aorta. Multiple enlarged porta hepatic and upper abdominal lymph nodes including a 1.4 cm porta hepatic lymph node (image 22; series 4). Other: None. Musculoskeletal: No aggressive or acute appearing osseous lesions. Lumbar spinal fusion hardware. IMPRESSION: Interval development of intrahepatic and extrahepatic biliary ductal dilatation. Multiple small filling defects are demonstrated within the common  bile duct suggestive of choledocholithiasis. Additionally  there is cholelithiasis as well as gallbladder wall thickening and small amount of associated pericholecystic edema concerning for acute cholecystitis. Nonspecific porta hepatic and upper abdominal adenopathy, potentially reactive in etiology. Recommend clinical and laboratory correlation. Patchy consolidation within the lingula and right lower lobe may represent infection or atelectasis. Continued radiographic followup to ensure resolution is recommended. Electronically Signed   By: Lovey Newcomer M.D.   On: 06/03/2016 20:05  Mr Jeananne Rama W/wo Cm/mrcp  Result Date: 06/03/2016 CLINICAL DATA:  Patient with new common bile duct dilatation. Evaluate for choledocholithiasis. EXAM: MRI ABDOMEN WITHOUT AND WITH CONTRAST (INCLUDING MRCP) TECHNIQUE: Multiplanar multisequence MR imaging of the abdomen was performed both before and after the administration of intravenous contrast. Heavily T2-weighted images of the biliary and pancreatic ducts were obtained, and three-dimensional MRCP images were rendered by post processing. CONTRAST:  34mL MULTIHANCE GADOBENATE DIMEGLUMINE 529 MG/ML IV SOLN COMPARISON:  CT abdomen pelvis 06/03/2016; 09/16/2015 FINDINGS: Lower chest: Patchy consolidation within the lingula and right lower lobe. No pleural effusion. Normal heart size. Hepatobiliary: The liver is normal in size and contour. No focal hepatic lesion is identified. There is circumferential wall thickening of the gallbladder with mild surrounding edema. New intrahepatic and extrahepatic biliary ductal dilatation. Common bile duct measures up to 9 mm. There are a few tiny filling defects demonstrated within the common bile duct within the mid aspect and distal aspect (image 28; series 6) concerning for choledocholithiasis. Pancreas: Unremarkable. Spleen: Unremarkable Adrenals/Urinary Tract: Unchanged thickening of the bilateral adrenal glands. Kidneys enhance symmetrically with contrast. There is a 2.6 cm cyst within the interpolar region  of the left kidney. Stomach/Bowel: No abnormal bowel wall thickening or evidence for bowel obstruction. Vascular/Lymphatic: Normal caliber abdominal aorta. Multiple enlarged porta hepatic and upper abdominal lymph nodes including a 1.4 cm porta hepatic lymph node (image 22; series 4). Other: None. Musculoskeletal: No aggressive or acute appearing osseous lesions. Lumbar spinal fusion hardware. IMPRESSION: Interval development of intrahepatic and extrahepatic biliary ductal dilatation. Multiple small filling defects are demonstrated within the common bile duct suggestive of choledocholithiasis. Additionally there is cholelithiasis as well as gallbladder wall thickening and small amount of associated pericholecystic edema concerning for acute cholecystitis. Nonspecific porta hepatic and upper abdominal adenopathy, potentially reactive in etiology. Recommend clinical and laboratory correlation. Patchy consolidation within the lingula and right lower lobe may represent infection or atelectasis. Continued radiographic followup to ensure resolution is recommended. Electronically Signed   By: Lovey Newcomer M.D.   On: 06/03/2016 20:05   Anti-infectives: Anti-infectives    Start     Dose/Rate Route Frequency Ordered Stop   06/03/16 2300  piperacillin-tazobactam (ZOSYN) IVPB 3.375 g     3.375 g 12.5 mL/hr over 240 Minutes Intravenous Every 8 hours 06/03/16 1620     06/03/16 2200  doxycycline (VIBRA-TABS) tablet 100 mg     100 mg Oral Every 12 hours 06/03/16 1502     06/03/16 1700  piperacillin-tazobactam (ZOSYN) IVPB 3.375 g     3.375 g 100 mL/hr over 30 Minutes Intravenous  Once 06/03/16 1620 06/03/16 1714   06/03/16 1130  doxycycline (VIBRA-TABS) tablet 100 mg  Status:  Discontinued     100 mg Oral Daily 06/03/16 1036 06/03/16 1502   06/03/16 0600  vancomycin (VANCOCIN) 500 mg in sodium chloride 0.9 % 100 mL IVPB     500 mg 100 mL/hr over 60 Minutes Intravenous Every 12 hours 06/02/16 1826     06/02/16 2000  ceFEPIme (MAXIPIME) 1 g in dextrose 5 % 50 mL IVPB  Status:  Discontinued     1 g 100 mL/hr over 30 Minutes Intravenous Every 8 hours 06/02/16 1747 06/03/16 1544   06/02/16 1830  vancomycin (VANCOCIN) IVPB 1000 mg/200 mL premix     1,000 mg 200 mL/hr over 60 Minutes Intravenous  Once 06/02/16 1816 06/03/16 0020   06/02/16 1630  cefTRIAXone (ROCEPHIN) 1 g in dextrose 5 % 50 mL IVPB     1 g 100 mL/hr over 30 Minutes Intravenous  Once 06/02/16 1622 06/02/16 1700   06/02/16 1630  doxycycline (VIBRA-TABS) tablet 100 mg     100 mg Oral  Once 06/02/16 1622 06/02/16 1640      Assessment Cholelithiasis with choledocholithiasis-LFTs better, asx this AM.    LOS: 3 days   Plan: ERCP today.   Aspin Palomarez Lenna Sciara 06/05/2016

## 2016-06-05 NOTE — Evaluation (Signed)
SLP Cancellation Note  Patient Details Name: Thomas Ferrell MRN: [id number] DOB: [birth year]   Cancelled treatment:       Reason Eval/Treat Not Completed:  (npo for procedure)   Luanna Salk, [place] [facility] SLP [phone]

## 2016-06-05 NOTE — Progress Notes (Signed)
PHARMACY - VANCOMYCIN (brief note)  75 yr old male on Vancomycin, Doxycycline and Zosyn for pneumonia and possible intra-abdominal infection.  Vancomycin trough level: 7 mcg/ml (goal 15-20) on current regimen of 500mg  IVq12h (last dose given 7/25 @ 0518)  Scr = 0.55 with CrCl ~ 68 ml/min Cultures negative to date  Plan:  Increase Vancomycin to 750mg  IV q12h           Follow cultures, renal function           Recheck Vancomycin trough level when appropriate  Leone Haven, PharmD

## 2016-06-05 NOTE — Progress Notes (Signed)
Pharmacy Antibiotic Note  Thomas Ferrell is a 75 y.o. male previously seen on 7/18 in [facility] ED and treated for CAP.  He presented to the ED  on 06/02/2016 with continued fevers and persistent infiltrate on CXR.  Patient's currently on abx for PNA and empiric coverage for possible intra-abdominal infection.   7/23: CBD obstruction seen on CT abd; MD would like to broaden to cover possible intra-abdominal infection; changing cefepime to Zosyn 7/23 abd MRI: choledocholithiasis, concern for acute cholecystitis 7/23 CXR: mild basilar opacity/atelectasis   Today, 06/05/2016: - afeb, wbc down wnl - scr stable (crcl~68) - plan for ERCP today   Plan:  Continue vancomycin 500 mg IV q12h.  Will check vancomycin trough level prior to PM dose today to assess current regimen and r/o accummulation  Continue Zosyn 3.375 gm IV q8h (infuse over 4 hours) _____________________   Height: 5\' 4"  (162.6 cm) Weight: 130 lb (59 kg) IBW/kg (Calculated) : 59.2  Temp (24hrs), Avg:98.3 F (36.8 C), Min:98.1 F (36.7 C), Max:98.4 F (36.9 C)   Recent Labs Lab 06/02/16 1605 06/02/16 1615 06/02/16 1929 06/03/16 0115 06/04/16 0510 06/05/16 0515  WBC 15.3*  --   --  15.2* 11.6* 8.6  CREATININE 0.83  --   --  0.76 0.69 0.55*  LATICACIDVEN  --  1.54 0.8 0.8  --   --     Estimated Creatinine Clearance: 68.6 mL/min (by C-G formula based on SCr of 0.8 mg/dL).    Allergies  Allergen Reactions  . Morphine And Related Itching    Antimicrobials this admission: Ceftriaxone x 1 + ZPak prior to admit Ceftriaxone + Doxy x 1 in ED Vancomycin 7/22 >>  Cefepime 7/22 >> 7/23 Doxycycline 7/23 >> Zosyn 7/23 >>   Dose adjustments this admission: ---   Microbiology results: 7/18 BCx x2: neg FINAL 7/22 BCx x2: 7/22 UCx:neg FINAL 7/22 nasal cx:  7/23 ur legionella:  7/25 cdiff PCR (-) 7/23 MRSA PCR:   Thank you for allowing pharmacy to be a part of this patient's care.  Dia Sitter, PharmD, BCPS 06/05/2016  10:28 AM

## 2016-06-05 NOTE — Progress Notes (Signed)
Following ERCP, pt has petechiae around both eyes.  CRNA and wife aware.   Reported off to bedside RN.

## 2016-06-06 ENCOUNTER — Encounter (HOSPITAL_COMMUNITY)
Admission: EM | Disposition: A | Discharge status: Home Health | Payer: Self-pay | Source: Home / Self Care | Attending: Internal Medicine

## 2016-06-06 ENCOUNTER — Encounter (HOSPITAL_COMMUNITY): Payer: Self-pay | Admitting: Gastroenterology

## 2016-06-06 ENCOUNTER — Inpatient Hospital Stay (HOSPITAL_COMMUNITY): Payer: Medicare Other | Admitting: Anesthesiology

## 2016-06-06 ENCOUNTER — Inpatient Hospital Stay (HOSPITAL_COMMUNITY): Payer: Medicare Other

## 2016-06-06 DIAGNOSIS — K8000 Calculus of gallbladder with acute cholecystitis without obstruction: Secondary | ICD-10-CM | POA: Diagnosis present

## 2016-06-06 HISTORY — PX: CHOLECYSTECTOMY: SHX55

## 2016-06-06 LAB — CBC
HCT: 38 % — ABNORMAL LOW (ref 39.0–52.0)
Hemoglobin: 12.5 g/dL — ABNORMAL LOW (ref 13.0–17.0)
MCH: 29.8 pg (ref 26.0–34.0)
MCHC: 32.9 g/dL (ref 30.0–36.0)
MCV: 90.7 fL (ref 78.0–100.0)
Platelets: 312 10*3/uL (ref 150–400)
RBC: 4.19 MIL/uL — ABNORMAL LOW (ref 4.22–5.81)
RDW: 13.7 % (ref 11.5–15.5)
WBC: 11.5 10*3/uL — AB (ref 4.0–10.5)

## 2016-06-06 LAB — COMPREHENSIVE METABOLIC PANEL
ALBUMIN: 3.2 g/dL — AB (ref 3.5–5.0)
ALK PHOS: 346 U/L — AB (ref 38–126)
ALT: 103 U/L — ABNORMAL HIGH (ref 17–63)
ANION GAP: 12 (ref 5–15)
AST: 49 U/L — AB (ref 15–41)
BILIRUBIN TOTAL: 2.1 mg/dL — AB (ref 0.3–1.2)
BUN: 8 mg/dL (ref 6–20)
CALCIUM: 8.5 mg/dL — AB (ref 8.9–10.3)
CO2: 24 mmol/L (ref 22–32)
Chloride: 100 mmol/L — ABNORMAL LOW (ref 101–111)
Creatinine, Ser: 0.57 mg/dL — ABNORMAL LOW (ref 0.61–1.24)
GFR calc Af Amer: >60 mL/min (ref >60)
GLUCOSE: 125 mg/dL — AB (ref 65–99)
POTASSIUM: 2.8 mmol/L — AB (ref 3.5–5.1)
Sodium: 136 mmol/L (ref 135–145)
TOTAL PROTEIN: 6.9 g/dL (ref 6.5–8.1)

## 2016-06-06 LAB — NASAL CULTURE (N/P): Culture: NORMAL

## 2016-06-06 LAB — SURGICAL PCR SCREEN
MRSA, PCR: NEGATIVE
STAPHYLOCOCCUS AUREUS: NEGATIVE

## 2016-06-06 LAB — MAGNESIUM: Magnesium: 1.6 mg/dL — ABNORMAL LOW (ref 1.7–2.4)

## 2016-06-06 LAB — POTASSIUM: POTASSIUM: 3.3 mmol/L — AB (ref 3.5–5.1)

## 2016-06-06 SURGERY — LAPAROSCOPIC CHOLECYSTECTOMY WITH INTRAOPERATIVE CHOLANGIOGRAM
Anesthesia: General

## 2016-06-06 MED ORDER — PROMETHAZINE HCL 25 MG/ML IJ SOLN
6.2500 mg | INTRAMUSCULAR | Status: DC | PRN
  Administered 2016-06-06: 6.25 mg via INTRAVENOUS

## 2016-06-06 MED ORDER — LIDOCAINE HCL (CARDIAC) 20 MG/ML IV SOLN
INTRAVENOUS | Status: DC | PRN
  Administered 2016-06-06: 50 mg via INTRAVENOUS

## 2016-06-06 MED ORDER — ONDANSETRON HCL 4 MG/2ML IJ SOLN
INTRAMUSCULAR | Status: DC | PRN
  Administered 2016-06-06: 4 mg via INTRAVENOUS

## 2016-06-06 MED ORDER — DEXAMETHASONE SODIUM PHOSPHATE 10 MG/ML IJ SOLN
INTRAMUSCULAR | Status: AC
  Filled 2016-06-06: qty 1

## 2016-06-06 MED ORDER — ONDANSETRON HCL 4 MG/2ML IJ SOLN
INTRAMUSCULAR | Status: AC
  Filled 2016-06-06: qty 2

## 2016-06-06 MED ORDER — PHENYLEPHRINE 40 MCG/ML (10ML) SYRINGE FOR IV PUSH (FOR BLOOD PRESSURE SUPPORT)
PREFILLED_SYRINGE | INTRAVENOUS | Status: AC
  Filled 2016-06-06: qty 20

## 2016-06-06 MED ORDER — PROPOFOL 10 MG/ML IV BOLUS
INTRAVENOUS | Status: DC | PRN
  Administered 2016-06-06: 100 mg via INTRAVENOUS

## 2016-06-06 MED ORDER — ROCURONIUM BROMIDE 100 MG/10ML IV SOLN
INTRAVENOUS | Status: DC | PRN
  Administered 2016-06-06: 5 mg via INTRAVENOUS
  Administered 2016-06-06: 25 mg via INTRAVENOUS

## 2016-06-06 MED ORDER — CHLORHEXIDINE GLUCONATE CLOTH 2 % EX PADS
6.0000 | MEDICATED_PAD | Freq: Once | CUTANEOUS | Status: AC
  Administered 2016-06-06: 6 via TOPICAL

## 2016-06-06 MED ORDER — SUGAMMADEX SODIUM 200 MG/2ML IV SOLN
INTRAVENOUS | Status: AC
  Filled 2016-06-06: qty 2

## 2016-06-06 MED ORDER — PREGABALIN 75 MG PO CAPS
75.0000 mg | ORAL_CAPSULE | Freq: Two times a day (BID) | ORAL | Status: DC
  Administered 2016-06-06 – 2016-06-08 (×4): 75 mg via ORAL
  Filled 2016-06-06 (×4): qty 1

## 2016-06-06 MED ORDER — POTASSIUM CHLORIDE 10 MEQ/100ML IV SOLN
10.0000 meq | INTRAVENOUS | Status: AC
  Administered 2016-06-06 (×4): 10 meq via INTRAVENOUS
  Filled 2016-06-06 (×4): qty 100

## 2016-06-06 MED ORDER — SUGAMMADEX SODIUM 200 MG/2ML IV SOLN
INTRAVENOUS | Status: AC
  Filled 2016-06-06: qty 4

## 2016-06-06 MED ORDER — SUCCINYLCHOLINE CHLORIDE 200 MG/10ML IV SOSY
PREFILLED_SYRINGE | INTRAVENOUS | Status: DC | PRN
  Administered 2016-06-06: 100 mg via INTRAVENOUS

## 2016-06-06 MED ORDER — CHLORHEXIDINE GLUCONATE CLOTH 2 % EX PADS: 6.0000 | MEDICATED_PAD | Freq: Once | CUTANEOUS | Status: DC

## 2016-06-06 MED ORDER — METOPROLOL TARTRATE 5 MG/5ML IV SOLN: 5.0000 mg | INTRAVENOUS | Status: DC | PRN

## 2016-06-06 MED ORDER — PROMETHAZINE HCL 25 MG/ML IJ SOLN
INTRAMUSCULAR | Status: AC
  Administered 2016-06-06: 6.25 mg via INTRAVENOUS
  Filled 2016-06-06: qty 1

## 2016-06-06 MED ORDER — AMLODIPINE BESYLATE 10 MG PO TABS: 10.0000 mg | ORAL_TABLET | Freq: Every day | ORAL | Status: DC

## 2016-06-06 MED ORDER — HYDROMORPHONE HCL 1 MG/ML IJ SOLN
INTRAMUSCULAR | Status: AC
  Filled 2016-06-06: qty 1

## 2016-06-06 MED ORDER — ACETAMINOPHEN 650 MG RE SUPP: 650.0000 mg | Freq: Four times a day (QID) | RECTAL | Status: DC | PRN

## 2016-06-06 MED ORDER — SODIUM CHLORIDE 0.9 % IV SOLN
INTRAVENOUS | Status: DC | PRN
  Administered 2016-06-06: 20 mL

## 2016-06-06 MED ORDER — SUGAMMADEX SODIUM 200 MG/2ML IV SOLN
INTRAVENOUS | Status: DC | PRN
  Administered 2016-06-06: 150 mg via INTRAVENOUS

## 2016-06-06 MED ORDER — LIDOCAINE HCL (CARDIAC) 20 MG/ML IV SOLN
INTRAVENOUS | Status: AC
  Filled 2016-06-06: qty 5

## 2016-06-06 MED ORDER — PROMETHAZINE HCL 25 MG/ML IJ SOLN
6.2500 mg | INTRAMUSCULAR | Status: DC | PRN
Start: 2016-06-06 — End: 2016-06-06

## 2016-06-06 MED ORDER — MAGNESIUM SULFATE IN D5W 1-5 GM/100ML-% IV SOLN
1.0000 g | Freq: Once | INTRAVENOUS | Status: AC
  Administered 2016-06-06: 1 g via INTRAVENOUS
  Filled 2016-06-06: qty 100

## 2016-06-06 MED ORDER — LACTATED RINGERS IR SOLN
Status: DC | PRN
  Administered 2016-06-06: 1000 mL

## 2016-06-06 MED ORDER — BUPIVACAINE-EPINEPHRINE (PF) 0.5% -1:200000 IJ SOLN
INTRAMUSCULAR | Status: AC
  Filled 2016-06-06: qty 30

## 2016-06-06 MED ORDER — FENTANYL CITRATE (PF) 100 MCG/2ML IJ SOLN
INTRAMUSCULAR | Status: DC | PRN
Start: 2016-06-06 — End: 2016-06-06
  Administered 2016-06-06 (×3): 50 ug via INTRAVENOUS
  Administered 2016-06-06: 100 ug via INTRAVENOUS

## 2016-06-06 MED ORDER — CEFAZOLIN SODIUM-DEXTROSE 2-4 GM/100ML-% IV SOLN
2.0000 g | INTRAVENOUS | Status: AC
  Administered 2016-06-06: 2 g via INTRAVENOUS

## 2016-06-06 MED ORDER — IOPAMIDOL (ISOVUE-300) INJECTION 61%
INTRAVENOUS | Status: AC
  Filled 2016-06-06: qty 50

## 2016-06-06 MED ORDER — ROCURONIUM BROMIDE 100 MG/10ML IV SOLN
INTRAVENOUS | Status: AC
  Filled 2016-06-06: qty 1

## 2016-06-06 MED ORDER — FENTANYL CITRATE (PF) 250 MCG/5ML IJ SOLN
INTRAMUSCULAR | Status: AC
  Filled 2016-06-06: qty 5

## 2016-06-06 MED ORDER — POTASSIUM CHLORIDE CRYS ER 20 MEQ PO TBCR
40.0000 meq | EXTENDED_RELEASE_TABLET | Freq: Once | ORAL | Status: AC
  Administered 2016-06-06: 40 meq via ORAL
  Filled 2016-06-06: qty 2

## 2016-06-06 MED ORDER — BUPIVACAINE-EPINEPHRINE 0.5% -1:200000 IJ SOLN
INTRAMUSCULAR | Status: DC | PRN
  Administered 2016-06-06: 18 mL

## 2016-06-06 MED ORDER — HYDROCODONE-ACETAMINOPHEN 5-325 MG PO TABS
1.0000 | ORAL_TABLET | ORAL | Status: DC | PRN
  Administered 2016-06-07 (×3): 1 via ORAL
  Administered 2016-06-07 – 2016-06-08 (×2): 2 via ORAL
  Filled 2016-06-06: qty 2
  Filled 2016-06-06 (×2): qty 1
  Filled 2016-06-06 (×2): qty 2
  Filled 2016-06-06: qty 1

## 2016-06-06 MED ORDER — CEFAZOLIN SODIUM-DEXTROSE 2-4 GM/100ML-% IV SOLN
INTRAVENOUS | Status: AC
  Filled 2016-06-06: qty 100

## 2016-06-06 MED ORDER — SODIUM CHLORIDE 0.9 % IV SOLN
INTRAVENOUS | Status: AC
  Administered 2016-06-06 – 2016-06-07 (×2): via INTRAVENOUS

## 2016-06-06 MED ORDER — LACTATED RINGERS IV SOLN
INTRAVENOUS | Status: DC | PRN
Start: 2016-06-06 — End: 2016-06-06
  Administered 2016-06-06: 13:00:00 via INTRAVENOUS

## 2016-06-06 MED ORDER — HYDROMORPHONE HCL 1 MG/ML IJ SOLN: 0.2500 mg | INTRAMUSCULAR | Status: DC | PRN

## 2016-06-06 MED ORDER — ACETAMINOPHEN 325 MG PO TABS: 650.0000 mg | ORAL_TABLET | Freq: Four times a day (QID) | ORAL | Status: DC | PRN

## 2016-06-06 MED ORDER — HYDROMORPHONE HCL 1 MG/ML IJ SOLN
0.2500 mg | INTRAMUSCULAR | Status: DC | PRN
  Administered 2016-06-06 (×6): 0.25 mg via INTRAVENOUS

## 2016-06-06 MED ORDER — PROPOFOL 10 MG/ML IV BOLUS
INTRAVENOUS | Status: AC
  Filled 2016-06-06: qty 20

## 2016-06-06 MED ORDER — DEXAMETHASONE SODIUM PHOSPHATE 10 MG/ML IJ SOLN
INTRAMUSCULAR | Status: DC | PRN
  Administered 2016-06-06: 10 mg via INTRAVENOUS

## 2016-06-06 SURGICAL SUPPLY — 38 items
APL SKNCLS STERI-STRIP NONHPOA (GAUZE/BANDAGES/DRESSINGS) ×1
APPLIER CLIP ROT 10 11.4 M/L (STAPLE) ×3
APR CLP MED LRG 11.4X10 (STAPLE) ×1
BAG SPEC RTRVL LRG 6X4 10 (ENDOMECHANICALS) ×1
BENZOIN TINCTURE PRP APPL 2/3 (GAUZE/BANDAGES/DRESSINGS) ×3 IMPLANT
CABLE HIGH FREQUENCY MONO STRZ (ELECTRODE) ×3 IMPLANT
CHLORAPREP W/TINT 26ML (MISCELLANEOUS) ×3 IMPLANT
CLIP APPLIE ROT 10 11.4 M/L (STAPLE) ×1 IMPLANT
CLOSURE WOUND 1/2 X4 (GAUZE/BANDAGES/DRESSINGS) ×1
COVER MAYO STAND STRL (DRAPES) ×3 IMPLANT
COVER SURGICAL LIGHT HANDLE (MISCELLANEOUS) ×3 IMPLANT
DECANTER SPIKE VIAL GLASS SM (MISCELLANEOUS) ×3 IMPLANT
DRAPE C-ARM 42X120 X-RAY (DRAPES) ×3 IMPLANT
DRAPE LAPAROSCOPIC ABDOMINAL (DRAPES) ×3 IMPLANT
ELECT REM PT RETURN 9FT ADLT (ELECTROSURGICAL) ×3
ELECTRODE REM PT RTRN 9FT ADLT (ELECTROSURGICAL) ×1 IMPLANT
GAUZE SPONGE 2X2 8PLY STRL LF (GAUZE/BANDAGES/DRESSINGS) ×1 IMPLANT
GLOVE SURG ORTHO 8.0 STRL STRW (GLOVE) ×3 IMPLANT
GOWN STRL REUS W/TWL XL LVL3 (GOWN DISPOSABLE) ×6 IMPLANT
HEMOSTAT SURGICEL 4X8 (HEMOSTASIS) IMPLANT
IRRIG SUCT STRYKERFLOW 2 WTIP (MISCELLANEOUS) ×3
IRRIGATION SUCT STRKRFLW 2 WTP (MISCELLANEOUS) ×1 IMPLANT
KIT BASIN OR (CUSTOM PROCEDURE TRAY) ×3 IMPLANT
POUCH SPECIMEN RETRIEVAL 10MM (ENDOMECHANICALS) ×3 IMPLANT
SCISSORS LAP 5X35 DISP (ENDOMECHANICALS) ×3 IMPLANT
SET CHOLANGIOGRAPH MIX (MISCELLANEOUS) ×3 IMPLANT
SLEEVE XCEL OPT CAN 5 100 (ENDOMECHANICALS) ×3 IMPLANT
SPONGE GAUZE 2X2 STER 10/PKG (GAUZE/BANDAGES/DRESSINGS) ×2
STRIP CLOSURE SKIN 1/2X4 (GAUZE/BANDAGES/DRESSINGS) ×2 IMPLANT
SUT ETHILON 3 0 PS 1 (SUTURE) ×3 IMPLANT
SUT MNCRL AB 4-0 PS2 18 (SUTURE) ×3 IMPLANT
TOWEL OR 17X26 10 PK STRL BLUE (TOWEL DISPOSABLE) ×3 IMPLANT
TOWEL OR NON WOVEN STRL DISP B (DISPOSABLE) ×3 IMPLANT
TRAY LAPAROSCOPIC (CUSTOM PROCEDURE TRAY) ×3 IMPLANT
TROCAR BLADELESS OPT 5 100 (ENDOMECHANICALS) ×3 IMPLANT
TROCAR XCEL BLUNT TIP 100MML (ENDOMECHANICALS) ×3 IMPLANT
TROCAR XCEL NON-BLD 11X100MML (ENDOMECHANICALS) ×3 IMPLANT
TUBING INSUF HEATED (TUBING) IMPLANT

## 2016-06-06 NOTE — Progress Notes (Signed)
Patient ID: Thomas Ferrell, male   DOB: 24-Nov-1940, 75 y.o.   MRN: [id number]  [facility]  Subjective: Patient in bed, family at bedside, comfortable.  Denies abd pain.  Anticipating surgery today.  Objective: Vital signs in last 24 hours: Temp:  [97.8 F (36.6 C)-98.5 F (36.9 C)] 98.1 F (36.7 C) (07/26 0537) Pulse Rate:  [54-69] 65 (07/26 0537) Resp:  [17-26] 18 (07/26 0537) BP: (148-179)/(44-85) 164/61 (07/25 2159) SpO2:  [91 %-100 %] 96 % (07/26 0537) Last BM Date: 06/05/16  Intake/Output from previous day: 07/25 0701 - 07/26 0700 In: 1610 [P.O.:110; I.V.:1500] Out: 1451 [Urine:1450; Stool:1] Intake/Output this shift: No intake/output data recorded.  Physical Exam: HEENT - sclerae clear, mucous membranes moist Abdomen - soft without distension; non-tender; no mass Ext - no edema, non-tender Neuro - alert & oriented, no focal deficits  Lab Results:   Recent Labs  06/05/16 0515 06/06/16 0530  WBC 8.6 11.5*  HGB 10.4* 12.5*  HCT 31.0* 38.0*  PLT 221 312   BMET  Recent Labs  06/05/16 0515 06/06/16 0530  NA 138 136  K 3.2* 2.8*  CL 106 100*  CO2 25 24  GLUCOSE 112* 125*  BUN 13 8  CREATININE 0.55* 0.57*  CALCIUM 8.3* 8.5*   PT/INR  Recent Labs  06/03/16 1553 06/05/16 1731  LABPROT 17.8* 15.9*  INR 1.46 1.30   Comprehensive Metabolic Panel:    Component Value Date/Time   NA 136 06/06/2016 0530   NA 138 06/05/2016 0515   K 2.8 (L) 06/06/2016 0530   K 3.2 (L) 06/05/2016 0515   CL 100 (L) 06/06/2016 0530   CL 106 06/05/2016 0515   CO2 24 06/06/2016 0530   CO2 25 06/05/2016 0515   BUN 8 06/06/2016 0530   BUN 13 06/05/2016 0515   CREATININE 0.57 (L) 06/06/2016 0530   CREATININE 0.55 (L) 06/05/2016 0515   GLUCOSE 125 (H) 06/06/2016 0530   GLUCOSE 112 (H) 06/05/2016 0515   CALCIUM 8.5 (L) 06/06/2016 0530   CALCIUM 8.3 (L) 06/05/2016 0515   AST 49 (H) 06/06/2016 0530   AST 85 (H) 06/05/2016 0515   ALT  103 (H) 06/06/2016 0530   ALT 134 (H) 06/05/2016 0515   ALKPHOS 346 (H) 06/06/2016 0530   ALKPHOS 314 (H) 06/05/2016 0515   BILITOT 2.1 (H) 06/06/2016 0530   BILITOT 2.4 (H) 06/05/2016 0515   PROT 6.9 06/06/2016 0530   PROT 6.6 06/05/2016 0515   ALBUMIN 3.2 (L) 06/06/2016 0530   ALBUMIN 2.9 (L) 06/05/2016 0515    Studies/Results: Dg Ercp Biliary & Pancreatic Ducts  Result Date: 06/05/2016 CLINICAL DATA:  Choledocholithiasis. EXAM: ERCP TECHNIQUE: Multiple spot images obtained with the fluoroscopic device and submitted for interpretation post-procedure. COMPARISON:  MRI and MRCP study on 06/03/2016 FINDINGS: Imaging obtained with a C-arm during the ERCP study demonstrates a dilated common bile duct containing filling defects. A balloon sweep maneuver was performed to extract calculi. Completion cholangiogram shows no further visualized filling defects. IMPRESSION: ERCP demonstrates filling defects in the common bile duct consistent with choledocholithiasis. A balloon sweep maneuver was performed to extract calculi. These images were submitted for radiologic interpretation only. Please see the procedural report for the amount of contrast and the fluoroscopy time utilized. Electronically Signed   By: Aletta Edouard M.D.   On: 06/05/2016 13:40   Assessment & Plans: Cholelithiasis, choledocholithiasis  ERCP results reviewed from yesterday  AM labs reviewed  Plan to proceed with lap  chole today  The risks and benefits of the procedure have been discussed at length with the patient.  The patient understands the proposed procedure, potential alternative treatments, and the course of recovery to be expected.  All of the patient's questions have been answered at this time.  The patient wishes to proceed with surgery.   Earnstine Regal, MD, [facility] Office: [place] 06/06/2016

## 2016-06-06 NOTE — Anesthesia Postprocedure Evaluation (Signed)
Anesthesia Post Note  Patient: Thomas Ferrell  Procedure(s) Performed: Procedure(s) (LRB): LAPAROSCOPIC CHOLECYSTECTOMY WITH INTRAOPERATIVE CHOLANGIOGRAM (N/A)  Patient location during evaluation: PACU Anesthesia Type: General Level of consciousness: sedated Pain management: pain level controlled Vital Signs Assessment: post-procedure vital signs reviewed and stable Respiratory status: spontaneous breathing and respiratory function stable Cardiovascular status: stable Anesthetic complications: no    Last Vitals:  Vitals:   06/06/16 1630 06/06/16 1645  BP: (!) 179/62 (!) 173/64  Pulse: 67 71  Resp: 18 15  Temp:      Last Pain:  Vitals:   06/06/16 1645  TempSrc:   PainSc: 4                  [name] DANIEL

## 2016-06-06 NOTE — Anesthesia Preprocedure Evaluation (Signed)
Anesthesia Evaluation  Patient identified by MRN, date of birth, ID band Patient awake    Reviewed: Allergy & Precautions, NPO status , Patient's Chart, lab work & pertinent test results  Airway Mallampati: II  TM Distance: >3 FB Neck ROM: Full    Dental no notable dental hx. (+) Dental Advisory Given   Pulmonary COPD, former smoker,    Pulmonary exam normal breath sounds clear to auscultation       Cardiovascular hypertension, + Peripheral Vascular Disease  Normal cardiovascular exam Rhythm:Regular Rate:Normal  Mild LVH with LVEF 55-60%. Grade 1 diastolic dysfunction with   normal estimated LV filling pressure. MAC with trivial mitral   regurgitation. Moderately sclerotic aortic valve as outlined   above. Trivial tricuspid regurgitation with PASP 38 mmHg. Mildly   dilated RV with preserved contraction. 06/03/16   Neuro/Psych CVA negative psych ROS   GI/Hepatic Neg liver ROS,   Endo/Other  negative endocrine ROS  Renal/GU negative Renal ROS  negative genitourinary   Musculoskeletal negative musculoskeletal ROS (+)   Abdominal   Peds negative pediatric ROS (+)  Hematology negative hematology ROS (+)   Anesthesia Other Findings   Reproductive/Obstetrics negative OB ROS                             Anesthesia Physical  Anesthesia Plan  ASA: III  Anesthesia Plan: General   Post-op Pain Management:    Induction: Intravenous  Airway Management Planned: Oral ETT  Additional Equipment:   Intra-op Plan:   Post-operative Plan: Extubation in OR  Informed Consent: I have reviewed the patients History and Physical, chart, labs and discussed the procedure including the risks, benefits and alternatives for the proposed anesthesia with the patient or authorized representative who has indicated his/her understanding and acceptance.   Dental advisory given  Plan Discussed with: CRNA and  Surgeon  Anesthesia Plan Comments:         Anesthesia Quick Evaluation

## 2016-06-06 NOTE — Anesthesia Procedure Notes (Addendum)
Procedure Name: Intubation Date/Time: 06/06/2016 1:55 PM Performed by: Tasnia Spegal, Virgel Gess Pre-anesthesia Checklist: Patient identified, Emergency Drugs available, Suction available and Patient being monitored Patient Re-evaluated:Patient Re-evaluated prior to inductionOxygen Delivery Method: Circle System Utilized Preoxygenation: Pre-oxygenation with 100% oxygen Intubation Type: IV induction, Rapid sequence and Cricoid Pressure applied Laryngoscope Size: Mac and 4 Grade View: Grade I Tube type: Oral Tube size: 7.5 mm Number of attempts: 1 Airway Equipment and Method: Stylet Placement Confirmation: ETT inserted through vocal cords under direct vision,  positive ETCO2 and breath sounds checked- equal and bilateral Secured at: 22 cm Tube secured with: Tape Dental Injury: Teeth and Oropharynx as per pre-operative assessment

## 2016-06-06 NOTE — Progress Notes (Signed)
PT Cancellation Note  Patient Details Name: Thomas Ferrell MRN: [id number] DOB: [birth year]   Cancelled Treatment:     surgery scheduled for today  Laparoscopic  Cholecystectomy    Rica Koyanagi  PTA [facility]  Acute  Rehab Pager      [phone]

## 2016-06-06 NOTE — Progress Notes (Signed)
PROGRESS NOTE                                                                                                                                                                                                             Patient Demographics:    Thomas Ferrell, is a 75 y.o. male, DOB - 05-Sep-1941, PM:[id number]  Admit date - 06/02/2016   Admitting Physician Thurnell Lose, MD  Outpatient Primary MD for the patient is Orpah Melter, MD  LOS - 4  Chief Complaint  Patient presents with  . Pneumonia       Brief Narrative     Thomas Ferrell  is a 75 y.o. male,  With history of PAD status post bilateral femoral surgery by Dr. 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 He was placed on azithromycin for 5 days which gave him diarrhea but his main symptoms persisted.  Came to the ER where chest x-ray showed right-sided infiltrate, he had signs of sepsis with temperature of 103 and leukocytosis, I was called to admit the patient for pneumonia causing sepsis. Besides above review of systems patient is stable, he appears nontoxic, denies any chest or abdominal pain. No new focal weakness. Does have diarrhea but no blood or mucus in stool.   Subjective:    Thomas Ferrell today has, No headache, abd pain is better - No Nausea, No new weakness tingling or numbness, No Cough - SOB.     Assessment  & Plan :      1. Sepsis due to community-acquired pneumonia which has failed azithromycin and possibly due to #2 below. Had persistent infiltrate in the right lower lobe, denies any  episodes of choking or problems with swallowing food or liquids. Treated empirically with  Doxy, Vancomycin and Cefepime, clinically pneumonia has resolved. We will taper down antibiotics for #2 below only. All cultures negative thus far.  2. Subacute to chronic cholangitis with choledocholithiasis and ascending cholangitis -  Admission liver enzymes were stable,  next day of admission he had some abdominal discomfort and liver enzymes spiked, CT abdomen and MRCP abdomen confirmed CBD stone with possible cholecystitis, was given bowel rest with Zosyn. GI & [facility] were called, he is post ERCP on 06/05/2016 with CBD stone removal and sphincterectomy by Dr Amedeo Plenty, due for cholecystectomy on 06/06/16. Clinically he feels better on antibiotics.  3. Prolonged QTC. Coreg, IV magnesium and telemetry monitor. QTc now improved 451 ms.  4. COPD. Stable no wheezing supportive care  5. Stroke. With poor baseline balance. Cane when ambulates.  6. Few coughing episodes while swallowing. Speech eval pending.  7. Dyslipidemia. On statin.  8. Essential hypertension. Place on low-dose Coreg and as needed hydralazine, hold ACE inhibitor due to sepsis.  9. Azithromycin induced diarrhea - resolved, no BM in > 14hrs after admission.  10. Sepsis induced mild Trop leak - B blocker, ASA, pain free, EKG non acute, flat Troponin trend.  11. ? Murmur - appreciated by ER MD - i could not, TTE stable .  12. Leukocytosis - resolved.  13. Hypokalemia - being replaced IV since 7.30 am.    Family Communication  :  Wife  Code Status :  Full  Diet : Heart Healthy  Disposition Plan  :  Stay inpt  Consults  :  None  Procedures  :    CT chest - lung nodule + R.infilterate  TTE - Mild LVH with LVEF 55-60%. Grade 1 diastolic dysfunction with normal estimated LV filling pressure. MAC with trivial mitral regurgitation. Moderately sclerotic aortic valve as outlined  above. Trivial tricuspid regurgitation with PASP 38  mmHg. Mildly  dilated RV with preserved contraction.  CT Abd - Pelvis - Possible subacute to chronic cholecystitis with CBD stone  MRI Abdomen/MRCP. Possible subacute to chronic cholecystitis with CBD stone   DVT Prophylaxis  :  Lovenox   Lab Results  Component Value Date   PLT 312 06/06/2016    Inpatient Medications  Scheduled Meds: . amLODipine  10 mg Oral Daily  . aspirin EC  325 mg Oral Daily  . carvedilol  3.125 mg Oral BID WC  . diatrizoate meglumine-sodium  30 mL Oral Once  . enoxaparin (LOVENOX) injection  40 mg Subcutaneous Daily  . feeding supplement (ENSURE ENLIVE)  237 mL Oral TID WC  . magnesium sulfate 1 - 4 g bolus IVPB  1 g Intravenous Once  . piperacillin-tazobactam (ZOSYN)  IV  3.375 g Intravenous Q8H  . potassium chloride  10 mEq Intravenous Q1 Hr x 6  . pregabalin  75 mg Oral Daily  . sertraline  100 mg Oral Daily  . simvastatin  20 mg Oral q1800  . sodium chloride  1,000 mL Intravenous Once   Continuous Infusions: . sodium chloride 75 mL/hr at 06/06/16 0805   PRN Meds:.acetaminophen, albuterol, guaiFENesin-dextromethorphan, hydrALAZINE, LORazepam, ondansetron (ZOFRAN) IV, oxyCODONE  Antibiotics  :    Anti-infectives    Start     Dose/Rate Route Frequency Ordered Stop   06/05/16 1900  vancomycin (VANCOCIN) IVPB 750 mg/150 ml premix  Status:  Discontinued     750 mg 150 mL/hr over 60 Minutes Intravenous Every 12 hours 06/05/16 1830 06/06/16 0735   06/03/16 2300  piperacillin-tazobactam (ZOSYN) IVPB 3.375 g     3.375 g 12.5 mL/hr over 240 Minutes Intravenous Every 8 hours 06/03/16 1620     06/03/16 2200  doxycycline (VIBRA-TABS) tablet 100 mg  Status:  Discontinued     100 mg Oral Every 12 hours 06/03/16  1502 06/06/16 0735   06/03/16 1700  piperacillin-tazobactam (ZOSYN) IVPB 3.375 g     3.375 g 100 mL/hr over 30 Minutes Intravenous  Once 06/03/16 1620 06/03/16 1714   06/03/16 1130  doxycycline (VIBRA-TABS) tablet 100 mg  Status:  Discontinued      100 mg Oral Daily 06/03/16 1036 06/03/16 1502   06/03/16 0600  vancomycin (VANCOCIN) 500 mg in sodium chloride 0.9 % 100 mL IVPB  Status:  Discontinued     500 mg 100 mL/hr over 60 Minutes Intravenous Every 12 hours 06/02/16 1826 06/05/16 1829   06/02/16 2000  ceFEPIme (MAXIPIME) 1 g in dextrose 5 % 50 mL IVPB  Status:  Discontinued     1 g 100 mL/hr over 30 Minutes Intravenous Every 8 hours 06/02/16 1747 06/03/16 1544   06/02/16 1830  vancomycin (VANCOCIN) IVPB 1000 mg/200 mL premix     1,000 mg 200 mL/hr over 60 Minutes Intravenous  Once 06/02/16 1816 06/03/16 0020   06/02/16 1630  cefTRIAXone (ROCEPHIN) 1 g in dextrose 5 % 50 mL IVPB     1 g 100 mL/hr over 30 Minutes Intravenous  Once 06/02/16 1622 06/02/16 1700   06/02/16 1630  doxycycline (VIBRA-TABS) tablet 100 mg     100 mg Oral  Once 06/02/16 1622 06/02/16 1640         Objective:   Vitals:   06/05/16 1420 06/05/16 1439 06/05/16 2159 06/06/16 0537  BP: (!) 148/85 (!) 155/44 (!) 164/61   Pulse: (!) 58 (!) 58 69 65  Resp: 19 18 17 18   Temp:  97.8 F (36.6 C) 98.5 F (36.9 C) 98.1 F (36.7 C)  TempSrc:  Oral Oral Oral  SpO2: 96% 97% 96% 96%  Weight:      Height:        Wt Readings from Last 3 Encounters:  06/02/16 59 kg (130 lb)  05/29/16 59 kg (130 lb)  02/02/16 56.7 kg (125 lb)     Intake/Output Summary (Last 24 hours) at 06/06/16 0908 Last data filed at 06/06/16 0300  Gross per 24 hour  Intake             1610 ml  Output             1451 ml  Net              159 ml     Physical Exam  Awake Alert, Oriented X 3, No new F.N deficits, Normal affect [place].AT,PERRAL Supple Neck,No JVD, No cervical lymphadenopathy appriciated.  Symmetrical Chest wall movement, Good air movement bilaterally, CTAB RRR,No Gallops,Rubs or new Murmurs, No Parasternal Heave +ve B.Sounds, Abd Soft, No tenderness, No organomegaly appriciated, No rebound - guarding or rigidity. No Cyanosis, Clubbing or edema, No new Rash or bruise       Data Review:    CBC  Recent Labs Lab 06/02/16 1605 06/03/16 0115 06/04/16 0510 06/05/16 0515 06/06/16 0530  WBC 15.3* 15.2* 11.6* 8.6 11.5*  HGB 12.3* 11.4* 9.9* 10.4* 12.5*  HCT 37.2* 34.4* 30.8* 31.0* 38.0*  PLT 246 214 183 221 312  MCV 91.4 91.5 93.3 91.7 90.7  MCH 30.2 30.3 30.0 30.8 29.8  MCHC 33.1 33.1 32.1 33.5 32.9  RDW 13.1 13.3 13.4 13.8 13.7  LYMPHSABS 0.6*  --   --   --   --   MONOABS 0.5  --   --   --   --   EOSABS 0.0  --   --   --   --  BASOSABS 0.0  --   --   --   --     Chemistries   Recent Labs Lab 06/02/16 1605 06/03/16 0115 06/03/16 1553 06/04/16 0510 06/05/16 0515 06/06/16 0530  NA 136 135  --  135 138 136  K 3.6 3.5  --  3.5 3.2* 2.8*  CL 100* 102  --  101 106 100*  CO2 26 23  --  24 25 24   GLUCOSE 144* 164*  --  108* 112* 125*  BUN 8 9  --  13 13 8   CREATININE 0.83 0.76  --  0.69 0.55* 0.57*  CALCIUM 9.2 8.5*  --  8.1* 8.3* 8.5*  AST 21  --  339* 150* 85* 49*  ALT 28  --  271* 184* 134* 103*  ALKPHOS 116  --  292* 269* 314* 346*  BILITOT 0.7  --  4.4* 5.0* 2.4* 2.1*   ------------------------------------------------------------------------------------------------------------------ No results for input(s): CHOL, HDL, LDLCALC, TRIG, CHOLHDL, LDLDIRECT in the last 72 hours.  Lab Results  Component Value Date   HGBA1C  01/30/2011    5.6 (NOTE)                                                                       According to the ADA Clinical Practice Recommendations for 2011, when HbA1c is used as a screening test:   >=6.5%   Diagnostic of Diabetes Mellitus           (if abnormal result  is confirmed)  5.7-6.4%   Increased risk of developing Diabetes Mellitus  References:Diagnosis and Classification of Diabetes Mellitus,Diabetes Care,2011,34(Suppl 1):S62-S69 and Standards of Medical Care in         Diabetes - 2011,Diabetes [id number]  (Suppl 1):S11-S61.    ------------------------------------------------------------------------------------------------------------------ No results for input(s): TSH, T4TOTAL, T3FREE, THYROIDAB in the last 72 hours.  Invalid input(s): FREET3 ------------------------------------------------------------------------------------------------------------------ No results for input(s): VITAMINB12, FOLATE, FERRITIN, TIBC, IRON, RETICCTPCT in the last 72 hours.  Coagulation profile  Recent Labs Lab 06/02/16 1815 06/03/16 1553 06/05/16 1731  INR 1.20 1.46 1.30    No results for input(s): DDIMER in the last 72 hours.  Cardiac Enzymes  Recent Labs Lab 06/02/16 1605 06/02/16 1935 06/03/16 0115  TROPONINI 0.03* 0.03* 0.03*   ------------------------------------------------------------------------------------------------------------------    Component Value Date/Time   BNP 92.3 05/29/2016 [id number]    Micro Results Recent Results (from the past 240 hour(s))  Blood culture (routine x 2)     Status: None   Collection Time: 05/29/16  8:50 AM  Result Value Ref Range Status   Specimen Description BLOOD LEFT ANTECUBITAL  Final   Special Requests BOTTLES DRAWN AEROBIC AND ANAEROBIC 5CC  Final   Culture   Final    NO GROWTH 5 DAYS Performed at [facility]    Report Status 06/03/2016 FINAL  Final  Blood culture (routine x 2)     Status: None   Collection Time: 05/29/16  8:54 AM  Result Value Ref Range Status   Specimen Description BLOOD RIGHT ANTECUBITAL  Final   Special Requests BOTTLES DRAWN AEROBIC AND ANAEROBIC 5ML  Final   Culture   Final    NO GROWTH 5 DAYS Performed at [facility]    Report Status 06/03/2016  FINAL  Final  Culture, blood (Routine x 2)     Status: None (Preliminary result)   Collection Time: 06/02/16  4:10 PM  Result Value Ref Range Status   Specimen Description BLOOD RIGHT ARM  5 ML IN [facility] BOTTLE  Final   Special Requests NONE  Final   Culture   Final    NO  GROWTH 3 DAYS Performed at [facility]    Report Status PENDING  Incomplete  Culture, blood (Routine x 2)     Status: None (Preliminary result)   Collection Time: 06/02/16  4:10 PM  Result Value Ref Range Status   Specimen Description BLOOD LEFT HAND  10 ML IN [facility] BOTTLE  Final   Special Requests NONE  Final   Culture   Final    NO GROWTH 3 DAYS Performed at [facility]    Report Status PENDING  Incomplete  Urine culture     Status: None   Collection Time: 06/02/16  5:10 PM  Result Value Ref Range Status   Specimen Description URINE, CLEAN CATCH  Final   Special Requests NONE  Final   Culture NO GROWTH Performed at [facility]   Final   Report Status 06/03/2016 FINAL  Final  Nasal culture     Status: None (Preliminary result)   Collection Time: 06/03/16  7:50 PM  Result Value Ref Range Status   Specimen Description NOSE  Final   Special Requests NONE  Final   Culture   Final    CULTURE REINCUBATED FOR BETTER GROWTH Performed at [facility]    Report Status PENDING  Incomplete  C difficile quick scan w PCR reflex     Status: None   Collection Time: 06/05/16  6:52 AM  Result Value Ref Range Status   C Diff antigen NEGATIVE NEGATIVE Final   C Diff toxin NEGATIVE NEGATIVE Final   C Diff interpretation No C. difficile detected.  Final    Radiology Reports Dg Chest 2 View  Result Date: 06/03/2016 CLINICAL DATA:  75 year old male with fever and cough, pneumonia. EXAM: CHEST  2 VIEW COMPARISON:  06/02/2016 FINDINGS: The cardiomediastinal silhouette is unremarkable. Streaky lingular opacity is unchanged. Mild peribronchial thickening is again identified. Minimal bibasilar opacities/atelectasis again noted. There is no evidence of pneumothorax, pleural effusion or acute bony abnormality. IMPRESSION: Unchanged appearance of the chest with streaky lingular opacity and mild basilar opacity/atelectasis. Electronically Signed   By: Margarette Canada M.D.   On:  06/03/2016 16:27  Dg Chest 2 View  Result Date: 06/02/2016 CLINICAL DATA:  Fever and cough EXAM: CHEST  2 VIEW COMPARISON:  May 29, 2016 FINDINGS: Persistent lingular opacity. There is also mild opacity in the right base which is unchanged. No other interval changes. IMPRESSION: Persistent infiltrates in the lingula and right base. Recommend follow-up to resolution. Electronically Signed   By: Dorise Bullion III M.D   On: 06/02/2016 16:51   Dg Chest 2 View  Result Date: 05/29/2016 CLINICAL DATA:  Chest pain EXAM: CHEST  2 VIEW COMPARISON:  12/22/2013 FINDINGS: There is a new rounded opacity near the left hilum with neighboring atelectasis or scar. There is no edema, consolidation, effusion, or pneumothorax. Chronic bronchitic markings. Normal heart size and aortic contours. Aortic atherosclerosis. IMPRESSION: 1. Left perihilar nodule.  Recommend chest CT. 2. History of COPD.  Stable bronchitic markings. Electronically Signed   By: Monte Fantasia M.D.   On: 05/29/2016 06:38   Ct Abdomen Pelvis W Contrast  Result Date: 06/03/2016 CLINICAL DATA:  Chronic upper abdominal and back pain for weeks. Sepsis. EXAM: CT ABDOMEN AND PELVIS WITH CONTRAST TECHNIQUE: Multidetector CT imaging of the abdomen and pelvis was performed using the standard protocol following bolus administration of intravenous contrast. CONTRAST:  123mL ISOVUE-300 IOPAMIDOL (ISOVUE-300) INJECTION 61% COMPARISON:  CT abdomen dated 09/16/2015. FINDINGS: Lower chest: Patchy small consolidations at each lung base, most likely atelectasis. Hepatobiliary: Significant thickening of the gallbladder walls, similar to the appearance on earlier CT abdomen of 09/16/2015. At least mildly increased pericholecystic edema. New common bile duct dilatation and prominent intrahepatic bile duct dilatation. Questionable small stones within the distal common bile duct. No focal mass or lesion within the liver. Pancreas: No mass, inflammatory changes, or other  significant abnormality. Spleen: Within normal limits in size and appearance. Adrenals/Urinary Tract: Adrenal glands are bulbous in configuration, similar to previous exams, without circumscribed mass. Left renal cysts. No renal stone or hydronephrosis bilaterally. No perinephric inflammation. No ureteral or bladder calculi identified. Bladder is unremarkable, partially decompressed. Stomach/Bowel: Bowel is normal in caliber. No bowel wall thickening or evidence of bowel wall inflammation. Appendix is not seen but there are no inflammatory changes about the cecum to suggest acute appendicitis. Stomach appears normal. Vascular/Lymphatic: Heavy atherosclerotic changes of the normal caliber abdominal aorta, aortic branch vessels and pelvic vasculature. Small and mildly prominent lymph nodes noted within the upper abdomen. No enlarged lymph nodes within the lower abdomen or pelvis. Reproductive: No mass or other significant abnormality. Other: Trace free fluid in the lower pelvis. Trace fluid at the lower margin of the liver. No abscess collections seen. No free intraperitoneal air. Musculoskeletal: No acute or suspicious osseous finding. Fixation hardware within the lumbar spine appears appropriately positioned. Superficial soft tissues are unremarkable for acute process. 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 IMPRESSION: 1. New common bile duct and intrahepatic bile duct dilatation. Questionable small stones within the distal common bile duct but not definitive. Recommend further characterization with ERCP or MRCP. 2. Prominent gallbladder wall thickening and pericholecystic edema. The gallbladder wall thickening is similar to findings on earlier CT abdomen of 09/16/2015, suggesting recurrent versus chronic cholecystitis. Pericholecystic edema is slightly more prominent on today's exam. 3. Mildly prominent lymph nodes within the upper abdomen, likely reactive in nature. 4. Aortic atherosclerosis. Additional chronic/incidental  findings detailed above. These results were called by telephone at the time of interpretation on 06/03/2016 at 3:33 pm to Dr. Lala Lund , who verbally acknowledged these results. Electronically Signed   By: Franki Cabot M.D.   On: 06/03/2016 15:40  Mr 3d Recon At Scanner  Result Date: 06/03/2016 CLINICAL DATA:  Patient with new common bile duct dilatation. Evaluate for choledocholithiasis. EXAM: MRI ABDOMEN WITHOUT AND WITH CONTRAST (INCLUDING MRCP) TECHNIQUE: Multiplanar multisequence MR imaging of the abdomen was performed both before and after the administration of intravenous contrast. Heavily T2-weighted images of the biliary and pancreatic ducts were obtained, and three-dimensional MRCP images were rendered by post processing. CONTRAST:  96mL MULTIHANCE GADOBENATE DIMEGLUMINE 529 MG/ML IV SOLN COMPARISON:  CT abdomen pelvis 06/03/2016; 09/16/2015 FINDINGS: Lower chest: Patchy consolidation within the lingula and right lower lobe. No pleural effusion. Normal heart size. Hepatobiliary: The liver is normal in size and contour. No focal hepatic lesion is identified. There is circumferential wall thickening of the gallbladder with mild surrounding edema. New intrahepatic and extrahepatic biliary ductal dilatation. Common bile duct measures up to 9 mm. There are a few tiny filling defects demonstrated within the common bile duct  within the mid aspect and distal aspect (image 28; series 6) concerning for choledocholithiasis. Pancreas: Unremarkable. Spleen: Unremarkable Adrenals/Urinary Tract: Unchanged thickening of the bilateral adrenal glands. Kidneys enhance symmetrically with contrast. There is a 2.6 cm cyst within the interpolar region of the left kidney. Stomach/Bowel: No abnormal bowel wall thickening or evidence for bowel obstruction. Vascular/Lymphatic: Normal caliber abdominal aorta. Multiple enlarged porta hepatic and upper abdominal lymph nodes including a 1.4 cm porta hepatic lymph node (image  22; series 4). Other: None. Musculoskeletal: No aggressive or acute appearing osseous lesions. Lumbar spinal fusion hardware. IMPRESSION: Interval development of intrahepatic and extrahepatic biliary ductal dilatation. Multiple small filling defects are demonstrated within the common bile duct suggestive of choledocholithiasis. Additionally there is cholelithiasis as well as gallbladder wall thickening and small amount of associated pericholecystic edema concerning for acute cholecystitis. Nonspecific porta hepatic and upper abdominal adenopathy, potentially reactive in etiology. Recommend clinical and laboratory correlation. Patchy consolidation within the lingula and right lower lobe may represent infection or atelectasis. Continued radiographic followup to ensure resolution is recommended. Electronically Signed   By: Lovey Newcomer M.D.   On: 06/03/2016 20:05  Dg Ercp Biliary & Pancreatic Ducts  Result Date: 06/05/2016 CLINICAL DATA:  Choledocholithiasis. EXAM: ERCP TECHNIQUE: Multiple spot images obtained with the fluoroscopic device and submitted for interpretation post-procedure. COMPARISON:  MRI and MRCP study on 06/03/2016 FINDINGS: Imaging obtained with a C-arm during the ERCP study demonstrates a dilated common bile duct containing filling defects. A balloon sweep maneuver was performed to extract calculi. Completion cholangiogram shows no further visualized filling defects. IMPRESSION: ERCP demonstrates filling defects in the common bile duct consistent with choledocholithiasis. A balloon sweep maneuver was performed to extract calculi. These images were submitted for radiologic interpretation only. Please see the procedural report for the amount of contrast and the fluoroscopy time utilized. Electronically Signed   By: Aletta Edouard M.D.   On: 06/05/2016 13:40  Mr Jeananne Rama W/wo Cm/mrcp  Result Date: 06/03/2016 CLINICAL DATA:  Patient with new common bile duct dilatation. Evaluate for  choledocholithiasis. EXAM: MRI ABDOMEN WITHOUT AND WITH CONTRAST (INCLUDING MRCP) TECHNIQUE: Multiplanar multisequence MR imaging of the abdomen was performed both before and after the administration of intravenous contrast. Heavily T2-weighted images of the biliary and pancreatic ducts were obtained, and three-dimensional MRCP images were rendered by post processing. CONTRAST:  60mL MULTIHANCE GADOBENATE DIMEGLUMINE 529 MG/ML IV SOLN COMPARISON:  CT abdomen pelvis 06/03/2016; 09/16/2015 FINDINGS: Lower chest: Patchy consolidation within the lingula and right lower lobe. No pleural effusion. Normal heart size. Hepatobiliary: The liver is normal in size and contour. No focal hepatic lesion is identified. There is circumferential wall thickening of the gallbladder with mild surrounding edema. New intrahepatic and extrahepatic biliary ductal dilatation. Common bile duct measures up to 9 mm. There are a few tiny filling defects demonstrated within the common bile duct within the mid aspect and distal aspect (image 28; series 6) concerning for choledocholithiasis. Pancreas: Unremarkable. Spleen: Unremarkable Adrenals/Urinary Tract: Unchanged thickening of the bilateral adrenal glands. Kidneys enhance symmetrically with contrast. There is a 2.6 cm cyst within the interpolar region of the left kidney. Stomach/Bowel: No abnormal bowel wall thickening or evidence for bowel obstruction. Vascular/Lymphatic: Normal caliber abdominal aorta. Multiple enlarged porta hepatic and upper abdominal lymph nodes including a 1.4 cm porta hepatic lymph node (image 22; series 4). Other: None. Musculoskeletal: No aggressive or acute appearing osseous lesions. Lumbar spinal fusion hardware. IMPRESSION: Interval development of intrahepatic and extrahepatic biliary ductal dilatation. Multiple small filling  defects are demonstrated within the common bile duct suggestive of choledocholithiasis. Additionally there is cholelithiasis as well as  gallbladder wall thickening and small amount of associated pericholecystic edema concerning for acute cholecystitis. Nonspecific porta hepatic and upper abdominal adenopathy, potentially reactive in etiology. Recommend clinical and laboratory correlation. Patchy consolidation within the lingula and right lower lobe may represent infection or atelectasis. Continued radiographic followup to ensure resolution is recommended. Electronically Signed   By: Lovey Newcomer M.D.   On: 06/03/2016 20:05  Ct Angio Chest Aorta W/cm &/or Wo/cm  Result Date: 05/29/2016 CLINICAL DATA:  Chest pain starting 3 a.m., nausea, abnormal chest x-ray, history of bladder cancer EXAM: CT ANGIOGRAPHY CHEST without and WITH CONTRAST TECHNIQUE: Multidetector CT imaging of the chest was performed using the standard protocol during bolus administration of intravenous contrast. Multiplanar CT image reconstructions and MIPs were obtained to evaluate the vascular anatomy. Unenhanced images of the chest were provided. CONTRAST:  100 cc Isovue COMPARISON:  CT chest 03/21/2010 FINDINGS: Cardiovascular: Unenhanced images shows atherosclerotic calcifications of thoracic aorta and coronary arteries. Atherosclerotic calcifications are noted upper abdominal aorta, SMA origin, os celiac trunk origin and bilateral renal artery origin. Heart size within normal limits. No pericardial effusion. Arterial phase enhanced images demonstrates no evidence of aortic aneurysm or aortic dissection. The study is of excellent technical quality. No pulmonary embolus is noted. Pulmonary veins are unremarkable. Mediastinum/Nodes: There is no mediastinal hematoma or adenopathy. No hilar adenopathy is noted. Lungs/Pleura: Images of the lung parenchyma shows bilateral emphysematous changes especially in upper lobes. As noted on the chest x-ray there is triangular shape consolidation in lingula anteromedial with some air bronchogram. Some mucus bronchial plugging is noted in axial  image 44. Findings are highly suspicious for small focal pneumonia. Additional focal infiltrate/ pneumonia noted in right middle lobe medially and laterally please see axial image 70. Axial image 69 there is 6 mm nodule in left base/left lower lobe anteriorly. Upper Abdomen: The visualized upper abdomen shows stable thickening bilateral adrenal glands left greater than right. There is fatty infiltration of the liver. No calcified gallstones are noted within visualize gallbladder. Visualized pancreas and spleen is unremarkable. Visualized upper kidneys are unremarkable. Musculoskeletal: Sagittal images of the spine shows degenerative changes thoracic spine. Sagittal view of the sternum is unremarkable. Review of the MIP images confirms the above findings. IMPRESSION: 1. No aortic aneurysm or aortic dissection.  No pulmonary embolus. 2. Atherosclerotic calcifications of thoracic aorta, abdominal aorta, celiac trunk and SMA origin, bilateral renal artery origin. Atherosclerotic calcifications of coronary arteries. 3. No mediastinal hematoma or adenopathy. There is small somewhat triangular-shaped consolidation in lingula anteromedially with some air bronchogram. Findings highly suspicious for focal pneumonia. Additional focal infiltrate/pneumonia noted in right middle lobe laterally and medially. Follow-up to resolution after treatment is recommended. 4. There is 6 mm nodule in left base anteriorly/ left lower lobe. Non-contrast chest CT at 6-12 months is recommended. If the nodule is stable at time of repeat CT, then future CT at 18-24 months (from today's scan) is considered optional for low-risk patients, but is recommended for high-risk patients. This recommendation follows the consensus statement: Guidelines for Management of Incidental Pulmonary Nodules Detected on CT Images:From the Fleischner Society 2017; published online before print (10.1148/radiol.IJ:2314499). 5. Bilateral emphysematous changes are noted.  6. Fatty infiltration of the liver. Stable bilateral thickening of adrenal glands. Electronically Signed   By: Lahoma Crocker M.D.   On: 05/29/2016 08:07    Time Spent in minutes  30  Thurnell Lose M.D on 06/06/2016 at 9:08 AM  Between 7am to 7pm - Pager - [phone]  After 7pm go to [email or web address] - password [facility]  [facility] -  Office  [phone]

## 2016-06-06 NOTE — Op Note (Signed)
Procedure Note  Pre-operative Diagnosis:  Acute cholecystitis, cholelithiasis  Post-operative Diagnosis:  same  Surgeon:  Earnstine Regal, MD, FACS  Assistant:  None   Procedure:  Laparoscopic cholecystectomy with intra-operative cholangiography  Anesthesia:  General  Estimated Blood Loss:  minimal  Drains: 19Fr Blake to RUQ         Specimen: Gallbladder to pathology  Indications:  75 year old Caucasian male with peripheral vascular disease, coronary artery disease, hypertension, dyslipidemia, remote history of CVA comes back to the hospital with fever along with nausea and vomiting. He had been to the emergency room around July 18 with a productive cough and diagnosed with community-acquired pneumonia. He states on day of admission he developed persistent nausea vomiting after eating. He had a full sensation after eating only a small amount of food. He had some generalized upper abdominal discomfort. He reports a remote history of feeling full after eating a meal many years ago. He took some antacids which help with the discomfort. He was admitted for fever, nausea and vomiting. His liver function tests increased the day after admission. This prompted a CT scan which showed radiological evidence of a acute cholecystitis. MRCP was performed which demonstrated several common bile duct stones. Patient underwent ERCP successfully on 06-05-2016.  Now for cholecystectomy.  Procedure Details:  The patient was seen in the pre-op holding area. The risks, benefits, complications, treatment options, and expected outcomes were previously discussed with the patient. The patient agreed with the proposed plan and has signed the informed consent form.  The patient was brought to the Operating Room, identified as Thomas Ferrell and the procedure verified as laparoscopic cholecystectomy with intraoperative cholangiography. A "time out" was completed and the above information confirmed.  The patient was placed  in the supine position. Following induction of general anesthesia, the abdomen was prepped and draped in the usual aseptic fashion.  An incision was made in the skin near the umbilicus. The midline fascia was incised and the peritoneal cavity was entered and a Hasson canula was introduced under direct vision.  The Hasson canula was secured with a 0-Vicryl pursestring suture. Pneumoperitoneum was established with carbon dioxide. Additional trocars were introduced under direct vision along the right costal margin in the midline, mid-clavicular line, and anterior axillary line.  Dense omental adhesions were present in the right upper quadrant.  With some difficulty, the omentum and colon were bluntly mobilized off of the fundus of the gallbladder.   The gallbladder was identified and the fundus grasped and retracted cephalad. Adhesions were taken down bluntly and the electrocautery was utilized as needed, taking care not to injure any adjacent structures. The infundibulum was grasped and retracted laterally, exposing the peritoneum overlying the triangle of Calot. The peritoneum was incised and structures exposed with blunt dissection. There were extensive inflammatory changes present.  The cystic duct was clearly identified, bluntly dissected circumferentially.  An incision was made in the cystic duct and the cholangiogram catheter introduced. The catheter was secured using an ligaclip.  Real-time cholangiography was performed using C-arm fluoroscopy.  There was rapid filling of a normal caliber common bile duct.  There was reflux of contrast into the left and right hepatic ductal systems.  There was free flow distally into the duodenum without filling defect or obstruction.  The catheter was removed from the peritoneal cavity.  The cystic duct was then ligated with surgical clips and divided. The cystic artery was identified, dissected circumferentially, ligated with ligaclips, and divided.  The  gallbladder  was dissected away from the liver bed using the electrocautery for hemostasis. The gallbladder was completely removed from the liver and placed into an endocatch bag. The gallbladder was removed in the endocatch bag through the umbilical port site and submitted to pathology for review.  The right upper quadrant was irrigated and the gallbladder bed was inspected. Hemostasis was achieved with the electrocautery.  A 19Fr Blake drain was placed in the subhepatic space and exited through the lateral port site.  It was secured to the skin with a 3-0 Nylon suture.  Pneumoperitoneum was released after viewing removal of the trocars with good hemostasis noted. The umbilical wound was irrigated and the fascia was then closed with the pursestring suture.  Local anesthetic was infiltrated at all port sites. The skin incisions were closed with 4-0 Monocril subcuticular sutures and steri-strips and dressings were applied.  Instrument, sponge, and needle counts were correct at the conclusion of the case.  The patient was awakened from anesthesia and brought to the recovery room in stable condition.  The patient tolerated the procedure well.   Earnstine Regal, MD, [facility] Office: [phone]

## 2016-06-06 NOTE — Care Management Important Message (Signed)
Important Message  Patient Details  Name: Thomas Ferrell MRN: [id number] Date of Birth: Feb 05, 1941   Medicare Important Message Given:  Yes    Camillo Flaming 06/06/2016, 9:41 AMImportant Message  Patient Details  Name: Thomas Ferrell MRN: [id number] Date of Birth: [birth year]   Medicare Important Message Given:  Yes    Camillo Flaming 06/06/2016, 9:40 AM

## 2016-06-06 NOTE — Progress Notes (Signed)
PHARMACY NOTE -  [place] has been assisting with dosing of Zosyn for intra-abdominal infection. Dosage remains stable at 3.375 g IV q8 hrs by 4-hr infusion and need for further dosage adjustment appears unlikely at present.    Will sign off at this time.  Please reconsult if a change in clinical status warrants re-evaluation of dosage.  Reuel Boom, PharmD, BCPS Pager: [phone] 06/06/2016, 9:21 AM

## 2016-06-06 NOTE — Transfer of Care (Signed)
Immediate Anesthesia Transfer of Care Note  Patient: Thomas Ferrell  Procedure(s) Performed: Procedure(s): LAPAROSCOPIC CHOLECYSTECTOMY WITH INTRAOPERATIVE CHOLANGIOGRAM (N/A)  Patient Location: PACU  Anesthesia Type:General  Level of Consciousness:  sedated, patient cooperative and responds to stimulation  Airway & Oxygen Therapy:Patient Spontanous Breathing and Patient connected to face mask oxgen  Post-op Assessment:  Report given to PACU RN and Post -op Vital signs reviewed and stable  Post vital signs:  Reviewed and stable  Last Vitals:  Vitals:   06/05/16 2159 06/06/16 0537  BP: (!) 164/61   Pulse: 69 65  Resp: 17 18  Temp: 36.9 C [id number] C    Complications: No apparent anesthesia complications

## 2016-06-06 NOTE — Evaluation (Signed)
SLP Cancellation Note  Patient Details Name: Thomas Ferrell MRN: [id number] DOB: 08/22/1941   Cancelled treatment:       Reason Eval/Treat Not Completed: Other (comment) (pt npo for procedure, will continue efforts)   Luanna Salk, [facility] SLP [phone]

## 2016-06-07 ENCOUNTER — Encounter (HOSPITAL_COMMUNITY): Payer: Self-pay | Admitting: Surgery

## 2016-06-07 LAB — COMPREHENSIVE METABOLIC PANEL
ALBUMIN: 3 g/dL — AB (ref 3.5–5.0)
ALT: 89 U/L — AB (ref 17–63)
AST: 61 U/L — AB (ref 15–41)
Alkaline Phosphatase: 309 U/L — ABNORMAL HIGH (ref 38–126)
Anion gap: 9 (ref 5–15)
BUN: 8 mg/dL (ref 6–20)
CHLORIDE: 100 mmol/L — AB (ref 101–111)
CO2: 27 mmol/L (ref 22–32)
CREATININE: 0.61 mg/dL (ref 0.61–1.24)
Calcium: 8.3 mg/dL — ABNORMAL LOW (ref 8.9–10.3)
GFR calc Af Amer: >60 mL/min (ref >60)
GFR calc non Af Amer: >60 mL/min (ref >60)
Glucose, Bld: 134 mg/dL — ABNORMAL HIGH (ref 65–99)
POTASSIUM: 3.6 mmol/L (ref 3.5–5.1)
SODIUM: 136 mmol/L (ref 135–145)
Total Bilirubin: 1.6 mg/dL — ABNORMAL HIGH (ref 0.3–1.2)
Total Protein: 6.4 g/dL — ABNORMAL LOW (ref 6.5–8.1)

## 2016-06-07 LAB — CULTURE, BLOOD (ROUTINE X 2)
CULTURE: NO GROWTH
Culture: NO GROWTH

## 2016-06-07 LAB — CBC
HEMATOCRIT: 36.3 % — AB (ref 39.0–52.0)
Hemoglobin: 11.9 g/dL — ABNORMAL LOW (ref 13.0–17.0)
MCH: 29.6 pg (ref 26.0–34.0)
MCHC: 32.8 g/dL (ref 30.0–36.0)
MCV: 90.3 fL (ref 78.0–100.0)
PLATELETS: 304 10*3/uL (ref 150–400)
RBC: 4.02 MIL/uL — ABNORMAL LOW (ref 4.22–5.81)
RDW: 13.5 % (ref 11.5–15.5)
WBC: 16.1 10*3/uL — ABNORMAL HIGH (ref 4.0–10.5)

## 2016-06-07 MED ORDER — POTASSIUM CHLORIDE CRYS ER 20 MEQ PO TBCR
40.0000 meq | EXTENDED_RELEASE_TABLET | Freq: Once | ORAL | Status: AC
  Administered 2016-06-07: 40 meq via ORAL
  Filled 2016-06-07: qty 2

## 2016-06-07 MED ORDER — SODIUM CHLORIDE 0.9 % IV SOLN
INTRAVENOUS | Status: DC
  Administered 2016-06-07: 19:00:00 via INTRAVENOUS

## 2016-06-07 NOTE — Evaluation (Signed)
Clinical/Bedside Swallow Evaluation Patient Details  Name: Thomas Ferrell MRN: [id number] Date of Birth: 1941/08/30  Today's Date: 06/07/2016 Time: SLP Start Time (ACUTE ONLY): [id number] SLP Stop Time (ACUTE ONLY): 1053 SLP Time Calculation (min) (ACUTE ONLY): 14 min  Past Medical History:  Past Medical History:  Diagnosis Date  . Abnormality of gait 03/03/2013  . bladder ca dx'd 11/2009   chemo/xrt comp 02/2010  . Cerebrovascular disease    right brain CVA 2007  . COPD (chronic obstructive pulmonary disease) ([place])   . Cough productive of clear sputum    TX RECENT SINUS INFECTION   . CVA (cerebral vascular accident) ([place]) 2007   r-cva  . Dyslipidemia   . Hypertension   . Lumbago    Past Surgical History:  Past Surgical History:  Procedure Laterality Date  . ABDOMINAL AORTAGRAM N/A 12/07/2013   Procedure: ABDOMINAL Maxcine Ham;  Surgeon: Angelia Mould, MD;  Location: [facility] CATH LAB;  Service: Cardiovascular;  Laterality: N/A;  . BACK SURGERY  2010  . bladder cancer    . CAROTID ARTERY ANGIOPLASTY  2009  . CHOLECYSTECTOMY N/A 06/06/2016   Procedure: LAPAROSCOPIC CHOLECYSTECTOMY WITH INTRAOPERATIVE CHOLANGIOGRAM;  Surgeon: Armandina Gemma, MD;  Location: [facility] ORS;  Service: General;  Laterality: N/A;  . ERCP N/A 06/05/2016   Procedure: ENDOSCOPIC RETROGRADE CHOLANGIOPANCREATOGRAPHY (ERCP);  Surgeon: Teena Irani, MD;  Location: [facility] ENDOSCOPY;  Service: Endoscopy;  Laterality: N/A;  . ESOPHAGOGASTRODUODENOSCOPY (EGD) WITH PROPOFOL N/A 06/05/2016   Procedure: ESOPHAGOGASTRODUODENOSCOPY (EGD);  Surgeon: Teena Irani, MD;  Location: [facility] ENDOSCOPY;  Service: Endoscopy;  Laterality: N/A;  . FEMORAL-FEMORAL BYPASS GRAFT N/A 12/09/2013   Procedure: BYPASS GRAFT FEMORAL-FEMORAL ARTERY WITH BILATERAL ENDARTERECTOMY ;  Surgeon: Elam Dutch, MD;  Location: [facility] OR;  Service: Vascular;  Laterality: N/A;  . FEMORAL-POPLITEAL BYPASS GRAFT Left 12/09/2013   Procedure: BYPASS GRAFT FEMORAL-POPLITEAL ARTERY - LEFT  ;  Surgeon: Elam Dutch, MD;  Location: [facility];  Service: Vascular;  Laterality: Left;  . LAMINECTOMY  09/08/2013   L 2 L3 L4 L5       Dr Luiz Ochoa   HPI:  [name] a 75 y.o.male,With history of PAD status post bilateral femoral surgery by Dr. 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 chest x-ray showed right-sided infiltrate, he had signs of sepsis with temperature of 103 and leukocytosis. Per MD denies any episodes of choking or problems with swallowing food or liquids.    Assessment / Plan / Recommendation Clinical Impression  Pt demosntrates no focal deficits to suggest dysphagia; his swallow is strong and timely. He is observed to have a baseline cough and coughs 5-10 seconds after drinking. This is difficult to isolate from his baseline cough. Given lack of other deficits, suspect cough is not a late response to aspiration. However, given RLL pna, will f/u again tomorrow for a second observation and proceed with MBS if needed.   Explained rationale to pt who will also plan to observe pattern of coughing and report to SLP.     Aspiration Risk  Mild aspiration risk    Diet Recommendation Thin liquid   Liquid Administration via: Straw;Cup Medication Administration: Whole  meds with liquid Supervision: Patient able to self feed Compensations: Slow rate;Small sips/bites Postural Changes: Seated upright at 90 degrees    Other  Recommendations     Follow up Recommendations  None    Frequency and Duration min 2x/week  1 week       Prognosis Prognosis for Safe Diet Advancement: Good      Swallow Study    General HPI: [name] a 75 y.o.male,With history of PAD status post bilateral femoral surgery by Dr. 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 chest x-ray showed right-sided infiltrate, he had signs of sepsis with temperature of 103 and leukocytosis. Per MD denies any episodes of choking or problems with swallowing food or liquids.  Type of Study: Bedside Swallow Evaluation Previous Swallow Assessment: none Diet Prior to this Study: Thin liquids Temperature Spikes Noted: No Respiratory Status: Room air History of Recent Intubation: No Behavior/Cognition: Alert;Cooperative;Pleasant mood Oral Cavity Assessment: Within Functional Limits Oral Care Completed by SLP: No Oral Cavity - Dentition: Dentures, top;Dentures, bottom Vision: Functional for self-feeding Self-Feeding Abilities: Able to feed self Patient Positioning: Upright in bed Baseline Vocal Quality: Normal Volitional Cough: Weak;Congested Volitional Swallow: Able to elicit    Oral/Motor/Sensory Function Overall Oral Motor/Sensory Function: Within functional limits   Ice Chips     Thin Liquid Thin Liquid: Within functional limits Presentation: Cup;Self Fed;Straw Other Comments: late cough    Nectar Thick Nectar Thick Liquid: Not tested   Honey Thick Honey Thick Liquid: Not tested   Puree Puree: Not tested   Solid   GO   Solid: Not tested        Thomas Ferrell, Thomas Ferrell 06/07/2016,11:41 AM

## 2016-06-07 NOTE — Progress Notes (Signed)
PROGRESS NOTE                                                                                                                                                                                                             Patient Demographics:    Thomas Ferrell, is a 75 y.o. male, DOB - Dec 01, 1940, PM:[id number]  Admit date - 06/02/2016   Admitting Physician Thurnell Lose, MD  Outpatient Primary MD for the patient is Orpah Melter, MD  LOS - 5  Chief Complaint  Patient presents with  . Pneumonia       Brief Narrative     Thomas Ferrell  is a 75 y.o. male,  With history of PAD status post bilateral femoral surgery by Dr. 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 He was placed on azithromycin for 5 days which gave him diarrhea but his main symptoms persisted.  Came to the ER where chest x-ray showed right-sided infiltrate, he had signs of sepsis with temperature of 103 and leukocytosis, I was called to admit the patient for pneumonia causing sepsis. Besides above review of systems patient is stable, he appears nontoxic, denies any chest or abdominal pain. No new focal weakness. Does have diarrhea but no blood or mucus in stool.   Subjective:    Thomas Ferrell today has, No headache, abd pain is better - No Nausea, No new weakness tingling or numbness, No Cough - SOB.     Assessment  & Plan :      1. Sepsis due to community-acquired pneumonia which has failed azithromycin and possibly due to #2 below. Had persistent infiltrate in the right lower lobe, denies any  episodes of choking or problems with swallowing food or liquids. Treated empirically with  Doxy, Vancomycin and Cefepime, clinically pneumonia has resolved. We will taper down antibiotics for #2 below only. All cultures negative thus far.  2. Subacute to chronic cholangitis with choledocholithiasis and ascending cholangitis -  Admission liver enzymes were stable,  next day of admission he had some abdominal discomfort and liver enzymes spiked, CT abdomen and MRCP abdomen confirmed CBD stone with possible cholecystitis, was given bowel rest with Zosyn. GI & [facility]CS were called, he is post ERCP on 06/05/2016 with CBD stone removal and sphincterectomy by Dr Amedeo Plenty, now post cholecystectomy on 06/06/16. Clinically he feels better on antibiotics.  Will continue antibiotics for 1 more day postop and monitor, likely discharge on 06/08/2016.  3. Prolonged QTC. Coreg, IV magnesium and telemetry monitor. QTc now improved 451 ms.  4. COPD. Stable no wheezing supportive care  5. Stroke. With poor baseline balance. Cane when ambulates.  6. Few coughing episodes while swallowing. Speech eval pending.  7. Dyslipidemia. On statin.  8. Essential hypertension. Place on low-dose Coreg and as needed hydralazine, hold ACE inhibitor due to sepsis.  9. Azithromycin induced diarrhea - resolved, no BM in > 14hrs after admission.  10. Sepsis induced mild Trop leak - B blocker, ASA, pain free, EKG non acute, flat Troponin trend.  11. ? Murmur - appreciated by ER MD - i could not, TTE stable .  12. Leukocytosis - due to #1 and 2 above, also has some chronic leukocytosis going past in his chart for several months. Will monitor clinically.  13. Hypokalemia and hypomagnesemia- both replaced.    Family Communication  :  Wife  Code Status :  Full  Diet : Heart Healthy  Disposition Plan  :  Likely discharge home on 06/08/2016  Consults  :  None  Procedures  :    CT chest - lung nodule + R.infilterate  TTE  - Mild LVH with LVEF 55-60%. Grade 1 diastolic dysfunction with normal estimated LV filling pressure. MAC with trivial mitral regurgitation. Moderately sclerotic aortic valve as outlined  above. Trivial tricuspid regurgitation with PASP 38 mmHg. Mildly  dilated RV with preserved contraction.  CT Abd - Pelvis - Possible subacute to chronic cholecystitis with CBD stone  MRI Abdomen/MRCP. Possible subacute to chronic cholecystitis with CBD stone  Laparoscopic cholecystectomy with right upper quadrant JP drain placement by general surgery on 06/06/2016.   DVT Prophylaxis  :  Lovenox   Lab Results  Component Value Date   PLT 304 06/07/2016    Inpatient Medications  Scheduled Meds: . amLODipine  10 mg Oral Daily  . aspirin EC  325 mg Oral Daily  . carvedilol  3.125 mg Oral BID WC  . diatrizoate meglumine-sodium  30 mL Oral Once  . enoxaparin (LOVENOX) injection  40 mg Subcutaneous Daily  . feeding supplement (ENSURE ENLIVE)  237 mL Oral TID WC  . piperacillin-tazobactam (ZOSYN)  IV  3.375 g Intravenous Q8H  . pregabalin  75 mg Oral BID  . sertraline  100 mg Oral Daily  . simvastatin  20 mg Oral q1800  . sodium chloride  1,000 mL Intravenous Once   Continuous Infusions: . sodium chloride 50 mL/hr at 06/07/16 0745   PRN Meds:.acetaminophen **OR** acetaminophen, albuterol, guaiFENesin-dextromethorphan, hydrALAZINE, HYDROcodone-acetaminophen, LORazepam, metoprolol, ondansetron (ZOFRAN) IV, oxyCODONE  Antibiotics  :    Anti-infectives    Start     Dose/Rate Route Frequency Ordered Stop   06/06/16 0915  ceFAZolin (ANCEF) IVPB 2g/100 mL premix     2 g 200 mL/hr over 30 Minutes Intravenous On call to O.R. 06/06/16 0911 06/06/16 1400   06/05/16 1900  vancomycin (VANCOCIN) IVPB 750 mg/150 ml premix  Status:  Discontinued     750 mg 150 mL/hr over 60 Minutes Intravenous Every 12  hours 06/05/16 1830 06/06/16 0735   06/03/16 2300  piperacillin-tazobactam (ZOSYN) IVPB 3.375 g     3.375  g 12.5 mL/hr over 240 Minutes Intravenous Every 8 hours 06/03/16 1620     06/03/16 2200  doxycycline (VIBRA-TABS) tablet 100 mg  Status:  Discontinued     100 mg Oral Every 12 hours 06/03/16 1502 06/06/16 0735   06/03/16 1700  piperacillin-tazobactam (ZOSYN) IVPB 3.375 g     3.375 g 100 mL/hr over 30 Minutes Intravenous  Once 06/03/16 1620 06/03/16 1714   06/03/16 1130  doxycycline (VIBRA-TABS) tablet 100 mg  Status:  Discontinued     100 mg Oral Daily 06/03/16 1036 06/03/16 1502   06/03/16 0600  vancomycin (VANCOCIN) 500 mg in sodium chloride 0.9 % 100 mL IVPB  Status:  Discontinued     500 mg 100 mL/hr over 60 Minutes Intravenous Every 12 hours 06/02/16 1826 06/05/16 1829   06/02/16 2000  ceFEPIme (MAXIPIME) 1 g in dextrose 5 % 50 mL IVPB  Status:  Discontinued     1 g 100 mL/hr over 30 Minutes Intravenous Every 8 hours 06/02/16 1747 06/03/16 1544   06/02/16 1830  vancomycin (VANCOCIN) IVPB 1000 mg/200 mL premix     1,000 mg 200 mL/hr over 60 Minutes Intravenous  Once 06/02/16 1816 06/03/16 0020   06/02/16 1630  cefTRIAXone (ROCEPHIN) 1 g in dextrose 5 % 50 mL IVPB     1 g 100 mL/hr over 30 Minutes Intravenous  Once 06/02/16 1622 06/02/16 1700   06/02/16 1630  doxycycline (VIBRA-TABS) tablet 100 mg     100 mg Oral  Once 06/02/16 1622 06/02/16 1640         Objective:   Vitals:   06/06/16 2040 06/07/16 0536 06/07/16 0754 06/07/16 0756  BP: (!) 154/67 (!) 162/69    Pulse: 76 71    Resp: 18 18    Temp: 98.2 F (36.8 C) 98 F (36.7 C)    TempSrc: Oral Oral    SpO2: 93% 99% 90% 92%  Weight:      Height:        Wt Readings from Last 3 Encounters:  06/02/16 59 kg (130 lb)  05/29/16 59 kg (130 lb)  02/02/16 56.7 kg (125 lb)     Intake/Output Summary (Last 24 hours) at 06/07/16 0952 Last data filed at 06/07/16 0754  Gross per 24 hour  Intake              700 ml  Output             1790 ml  Net            -1090 ml     Physical Exam  Awake Alert, Oriented X 3, No  new F.N deficits, Normal affect [place].AT,PERRAL Supple Neck,No JVD, No cervical lymphadenopathy appriciated.  Symmetrical Chest wall movement, Good air movement bilaterally, CTAB RRR,No Gallops,Rubs or new Murmurs, No Parasternal Heave +ve B.Sounds, Abd Soft, Right upper quadrant JP drain in place, post laparoscopic cholecystectomy scars under bandage, No organomegaly appriciated, No rebound - guarding or rigidity. No Cyanosis, Clubbing or edema, No new Rash or bruise      Data Review:    CBC  Recent Labs Lab 06/02/16 1605 06/03/16 0115 06/04/16 0510 06/05/16 0515 06/06/16 0530 06/07/16 0527  WBC 15.3* 15.2* 11.6* 8.6 11.5* 16.1*  HGB 12.3* 11.4* 9.9* 10.4* 12.5* 11.9*  HCT 37.2* 34.4* 30.8* 31.0* 38.0* 36.3*  PLT 246 214 183 221 312 304  MCV 91.4 91.5 93.3 91.7 90.7 90.3  MCH 30.2 30.3 30.0 30.8 29.8 29.6  MCHC 33.1 33.1 32.1 33.5 32.9 32.8  RDW 13.1 13.3 13.4 13.8 13.7 13.5  LYMPHSABS 0.6*  --   --   --   --   --   MONOABS 0.5  --   --   --   --   --   EOSABS 0.0  --   --   --   --   --   BASOSABS 0.0  --   --   --   --   --     Chemistries   Recent Labs Lab 06/03/16 0115 06/03/16 1553 06/04/16 0510 06/05/16 0515 06/06/16 0530 06/06/16 1757 06/07/16 0527  NA 135  --  135 138 136  --  136  K 3.5  --  3.5 3.2* 2.8* 3.3* 3.6  CL 102  --  101 106 100*  --  100*  CO2 23  --  24 25 24   --  27  GLUCOSE 164*  --  108* 112* 125*  --  134*  BUN 9  --  13 13 8   --  8  CREATININE 0.76  --  0.69 0.55* 0.57*  --  0.61  CALCIUM 8.5*  --  8.1* 8.3* 8.5*  --  8.3*  MG  --   --   --   --   --  1.6*  --   AST  --  339* 150* 85* 49*  --  61*  ALT  --  271* 184* 134* 103*  --  89*  ALKPHOS  --  292* 269* 314* 346*  --  309*  BILITOT  --  4.4* 5.0* 2.4* 2.1*  --  1.6*   ------------------------------------------------------------------------------------------------------------------ No results for input(s): CHOL, HDL, LDLCALC, TRIG, CHOLHDL, LDLDIRECT in the last 72  hours.  Lab Results  Component Value Date   HGBA1C  01/30/2011    5.6 (NOTE)                                                                       According to the ADA Clinical Practice Recommendations for 2011, when HbA1c is used as a screening test:   >=6.5%   Diagnostic of Diabetes Mellitus           (if abnormal result  is confirmed)  5.7-6.4%   Increased risk of developing Diabetes Mellitus  References:Diagnosis and Classification of Diabetes Mellitus,Diabetes Care,2011,34(Suppl 1):S62-S69 and Standards of Medical Care in         Diabetes - 2011,Diabetes [id number]  (Suppl 1):S11-S61.   ------------------------------------------------------------------------------------------------------------------ No results for input(s): TSH, T4TOTAL, T3FREE, THYROIDAB in the last 72 hours.  Invalid input(s): FREET3 ------------------------------------------------------------------------------------------------------------------ No results for input(s): VITAMINB12, FOLATE, FERRITIN, TIBC, IRON, RETICCTPCT in the last 72 hours.  Coagulation profile  Recent Labs Lab 06/02/16 1815 06/03/16 1553 06/05/16 1731  INR 1.20 1.46 1.30    No results for input(s): DDIMER in the last 72 hours.  Cardiac Enzymes  Recent Labs Lab 06/02/16 1605 06/02/16 1935 06/03/16 0115  TROPONINI 0.03* 0.03* 0.03*   ------------------------------------------------------------------------------------------------------------------    Component Value Date/Time   BNP 92.3 05/29/2016 [id number]    Micro Results Recent Results (from the past 240  hour(s))  Blood culture (routine x 2)     Status: None   Collection Time: 05/29/16  8:50 AM  Result Value Ref Range Status   Specimen Description BLOOD LEFT ANTECUBITAL  Final   Special Requests BOTTLES DRAWN AEROBIC AND ANAEROBIC 5CC  Final   Culture   Final    NO GROWTH 5 DAYS Performed at [facility]    Report Status 06/03/2016 FINAL  Final  Blood culture  (routine x 2)     Status: None   Collection Time: 05/29/16  8:54 AM  Result Value Ref Range Status   Specimen Description BLOOD RIGHT ANTECUBITAL  Final   Special Requests BOTTLES DRAWN AEROBIC AND ANAEROBIC 5ML  Final   Culture   Final    NO GROWTH 5 DAYS Performed at [facility]    Report Status 06/03/2016 FINAL  Final  Culture, blood (Routine x 2)     Status: None (Preliminary result)   Collection Time: 06/02/16  4:10 PM  Result Value Ref Range Status   Specimen Description BLOOD RIGHT ARM  5 ML IN [facility] BOTTLE  Final   Special Requests NONE  Final   Culture   Final    NO GROWTH 4 DAYS Performed at [facility]    Report Status PENDING  Incomplete  Culture, blood (Routine x 2)     Status: None (Preliminary result)   Collection Time: 06/02/16  4:10 PM  Result Value Ref Range Status   Specimen Description BLOOD LEFT HAND  10 ML IN [facility] BOTTLE  Final   Special Requests NONE  Final   Culture   Final    NO GROWTH 4 DAYS Performed at [place]    Report Status PENDING  Incomplete  Urine culture     Status: None   Collection Time: 06/02/16  5:10 PM  Result Value Ref Range Status   Specimen Description URINE, CLEAN CATCH  Final   Special Requests NONE  Final   Culture NO GROWTH Performed at [facility]   Final   Report Status 06/03/2016 FINAL  Final  Nasal culture     Status: None   Collection Time: 06/03/16  7:50 PM  Result Value Ref Range Status   Specimen Description NOSE  Final   Special Requests NONE  Final   Culture   Final    NORMAL NASOPHARYNGEAL FLORA NO STAPHYLOCOCCUS AUREUS ISOLATED Performed at [facility]    Report Status 06/06/2016 FINAL  Final  C difficile quick scan w PCR reflex     Status: None   Collection Time: 06/05/16  6:52 AM  Result Value Ref Range Status   C Diff antigen NEGATIVE NEGATIVE Final   C Diff toxin NEGATIVE NEGATIVE Final   C Diff interpretation No C. difficile detected.  Final  Surgical pcr  screen     Status: None   Collection Time: 06/06/16 10:39 AM  Result Value Ref Range Status   MRSA, PCR NEGATIVE NEGATIVE Final   Staphylococcus aureus NEGATIVE NEGATIVE Final    Comment:        The Xpert SA Assay (FDA approved for NASAL specimens in patients over 57 years of age), is one component of a comprehensive surveillance program.  Test performance has been validated by [facility] for patients greater than or equal to 80 year old. It is not intended to diagnose infection nor to guide or monitor treatment.     Radiology Reports Dg Chest 2 View  Result Date: 06/03/2016 CLINICAL DATA:  75 year old male with fever and cough, pneumonia. EXAM: CHEST  2 VIEW COMPARISON:  06/02/2016 FINDINGS: The cardiomediastinal silhouette is unremarkable. Streaky lingular opacity is unchanged. Mild peribronchial thickening is again identified. Minimal bibasilar opacities/atelectasis again noted. There is no evidence of pneumothorax, pleural effusion or acute bony abnormality. IMPRESSION: Unchanged appearance of the chest with streaky lingular opacity and mild basilar opacity/atelectasis. Electronically Signed   By: Margarette Canada M.D.   On: 06/03/2016 16:27  Dg Chest 2 View  Result Date: 06/02/2016 CLINICAL DATA:  Fever and cough EXAM: CHEST  2 VIEW COMPARISON:  May 29, 2016 FINDINGS: Persistent lingular opacity. There is also mild opacity in the right base which is unchanged. No other interval changes. IMPRESSION: Persistent infiltrates in the lingula and right base. Recommend follow-up to resolution. Electronically Signed   By: Dorise Bullion III M.D   On: 06/02/2016 16:51   Dg Chest 2 View  Result Date: 05/29/2016 CLINICAL DATA:  Chest pain EXAM: CHEST  2 VIEW COMPARISON:  12/22/2013 FINDINGS: There is a new rounded opacity near the left hilum with neighboring atelectasis or scar. There is no edema, consolidation, effusion, or pneumothorax. Chronic bronchitic markings. Normal heart size and  aortic contours. Aortic atherosclerosis. IMPRESSION: 1. Left perihilar nodule.  Recommend chest CT. 2. History of COPD.  Stable bronchitic markings. Electronically Signed   By: Monte Fantasia M.D.   On: 05/29/2016 06:38   Dg Cholangiogram Operative  Result Date: 06/06/2016 CLINICAL DATA:  Cholecystectomy for cholecystitis and cholelithiasis. EXAM: INTRAOPERATIVE CHOLANGIOGRAM TECHNIQUE: Cholangiographic images from the C-arm fluoroscopic device were submitted for interpretation post-operatively. Please see the procedural report for the amount of contrast and the fluoroscopy time utilized. COMPARISON:  ERCP on 06/05/2016 FINDINGS: Intraoperative cholangiogram shows no evidence of residual filling defect in the common bile duct or biliary obstruction. Contrast enters the duodenum normally. Minimal amount of contrast extravasation at the level of the cystic duct stump. IMPRESSION: Unremarkable intraoperative cholangiogram. Electronically Signed   By: Aletta Edouard M.D.   On: 06/06/2016 17:06  Ct Abdomen Pelvis W Contrast  Result Date: 06/03/2016 CLINICAL DATA:  Chronic upper abdominal and back pain for weeks. Sepsis. EXAM: CT ABDOMEN AND PELVIS WITH CONTRAST TECHNIQUE: Multidetector CT imaging of the abdomen and pelvis was performed using the standard protocol following bolus administration of intravenous contrast. CONTRAST:  113mL ISOVUE-300 IOPAMIDOL (ISOVUE-300) INJECTION 61% COMPARISON:  CT abdomen dated 09/16/2015. FINDINGS: Lower chest: Patchy small consolidations at each lung base, most likely atelectasis. Hepatobiliary: Significant thickening of the gallbladder walls, similar to the appearance on earlier CT abdomen of 09/16/2015. At least mildly increased pericholecystic edema. New common bile duct dilatation and prominent intrahepatic bile duct dilatation. Questionable small stones within the distal common bile duct. No focal mass or lesion within the liver. Pancreas: No mass, inflammatory changes,  or other significant abnormality. Spleen: Within normal limits in size and appearance. Adrenals/Urinary Tract: Adrenal glands are bulbous in configuration, similar to previous exams, without circumscribed mass. Left renal cysts. No renal stone or hydronephrosis bilaterally. No perinephric inflammation. No ureteral or bladder calculi identified. Bladder is unremarkable, partially decompressed. Stomach/Bowel: Bowel is normal in caliber. No bowel wall thickening or evidence of bowel wall inflammation. Appendix is not seen but there are no inflammatory changes about the cecum to suggest acute appendicitis. Stomach appears normal. Vascular/Lymphatic: Heavy atherosclerotic changes of the normal caliber abdominal aorta, aortic branch vessels and pelvic vasculature. Small and mildly prominent lymph nodes noted within the upper abdomen.  No enlarged lymph nodes within the lower abdomen or pelvis. Reproductive: No mass or other significant abnormality. Other: Trace free fluid in the lower pelvis. Trace fluid at the lower margin of the liver. No abscess collections seen. No free intraperitoneal air. Musculoskeletal: No acute or suspicious osseous finding. Fixation hardware within the lumbar spine appears appropriately positioned. Superficial soft tissues are unremarkable for acute process. 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 IMPRESSION: 1. New common bile duct and intrahepatic bile duct dilatation. Questionable small stones within the distal common bile duct but not definitive. Recommend further characterization with ERCP or MRCP. 2. Prominent gallbladder wall thickening and pericholecystic edema. The gallbladder wall thickening is similar to findings on earlier CT abdomen of 09/16/2015, suggesting recurrent versus chronic cholecystitis. Pericholecystic edema is slightly more prominent on today's exam. 3. Mildly prominent lymph nodes within the upper abdomen, likely reactive in nature. 4. Aortic atherosclerosis. Additional  chronic/incidental findings detailed above. These results were called by telephone at the time of interpretation on 06/03/2016 at 3:33 pm to Dr. Lala Lund , who verbally acknowledged these results. Electronically Signed   By: Franki Cabot M.D.   On: 06/03/2016 15:40  Mr 3d Recon At Scanner  Result Date: 06/03/2016 CLINICAL DATA:  Patient with new common bile duct dilatation. Evaluate for choledocholithiasis. EXAM: MRI ABDOMEN WITHOUT AND WITH CONTRAST (INCLUDING MRCP) TECHNIQUE: Multiplanar multisequence MR imaging of the abdomen was performed both before and after the administration of intravenous contrast. Heavily T2-weighted images of the biliary and pancreatic ducts were obtained, and three-dimensional MRCP images were rendered by post processing. CONTRAST:  79mL MULTIHANCE GADOBENATE DIMEGLUMINE 529 MG/ML IV SOLN COMPARISON:  CT abdomen pelvis 06/03/2016; 09/16/2015 FINDINGS: Lower chest: Patchy consolidation within the lingula and right lower lobe. No pleural effusion. Normal heart size. Hepatobiliary: The liver is normal in size and contour. No focal hepatic lesion is identified. There is circumferential wall thickening of the gallbladder with mild surrounding edema. New intrahepatic and extrahepatic biliary ductal dilatation. Common bile duct measures up to 9 mm. There are a few tiny filling defects demonstrated within the common bile duct within the mid aspect and distal aspect (image 28; series 6) concerning for choledocholithiasis. Pancreas: Unremarkable. Spleen: Unremarkable Adrenals/Urinary Tract: Unchanged thickening of the bilateral adrenal glands. Kidneys enhance symmetrically with contrast. There is a 2.6 cm cyst within the interpolar region of the left kidney. Stomach/Bowel: No abnormal bowel wall thickening or evidence for bowel obstruction. Vascular/Lymphatic: Normal caliber abdominal aorta. Multiple enlarged porta hepatic and upper abdominal lymph nodes including a 1.4 cm porta hepatic  lymph node (image 22; series 4). Other: None. Musculoskeletal: No aggressive or acute appearing osseous lesions. Lumbar spinal fusion hardware. IMPRESSION: Interval development of intrahepatic and extrahepatic biliary ductal dilatation. Multiple small filling defects are demonstrated within the common bile duct suggestive of choledocholithiasis. Additionally there is cholelithiasis as well as gallbladder wall thickening and small amount of associated pericholecystic edema concerning for acute cholecystitis. Nonspecific porta hepatic and upper abdominal adenopathy, potentially reactive in etiology. Recommend clinical and laboratory correlation. Patchy consolidation within the lingula and right lower lobe may represent infection or atelectasis. Continued radiographic followup to ensure resolution is recommended. Electronically Signed   By: Lovey Newcomer M.D.   On: 06/03/2016 20:05  Dg Ercp Biliary & Pancreatic Ducts  Result Date: 06/05/2016 CLINICAL DATA:  Choledocholithiasis. EXAM: ERCP TECHNIQUE: Multiple spot images obtained with the fluoroscopic device and submitted for interpretation post-procedure. COMPARISON:  MRI and MRCP study on 06/03/2016 FINDINGS: Imaging obtained with a C-arm  during the ERCP study demonstrates a dilated common bile duct containing filling defects. A balloon sweep maneuver was performed to extract calculi. Completion cholangiogram shows no further visualized filling defects. IMPRESSION: ERCP demonstrates filling defects in the common bile duct consistent with choledocholithiasis. A balloon sweep maneuver was performed to extract calculi. These images were submitted for radiologic interpretation only. Please see the procedural report for the amount of contrast and the fluoroscopy time utilized. Electronically Signed   By: Aletta Edouard M.D.   On: 06/05/2016 13:40  Mr Jeananne Rama W/wo Cm/mrcp  Result Date: 06/03/2016 CLINICAL DATA:  Patient with new common bile duct dilatation. Evaluate for  choledocholithiasis. EXAM: MRI ABDOMEN WITHOUT AND WITH CONTRAST (INCLUDING MRCP) TECHNIQUE: Multiplanar multisequence MR imaging of the abdomen was performed both before and after the administration of intravenous contrast. Heavily T2-weighted images of the biliary and pancreatic ducts were obtained, and three-dimensional MRCP images were rendered by post processing. CONTRAST:  90mL MULTIHANCE GADOBENATE DIMEGLUMINE 529 MG/ML IV SOLN COMPARISON:  CT abdomen pelvis 06/03/2016; 09/16/2015 FINDINGS: Lower chest: Patchy consolidation within the lingula and right lower lobe. No pleural effusion. Normal heart size. Hepatobiliary: The liver is normal in size and contour. No focal hepatic lesion is identified. There is circumferential wall thickening of the gallbladder with mild surrounding edema. New intrahepatic and extrahepatic biliary ductal dilatation. Common bile duct measures up to 9 mm. There are a few tiny filling defects demonstrated within the common bile duct within the mid aspect and distal aspect (image 28; series 6) concerning for choledocholithiasis. Pancreas: Unremarkable. Spleen: Unremarkable Adrenals/Urinary Tract: Unchanged thickening of the bilateral adrenal glands. Kidneys enhance symmetrically with contrast. There is a 2.6 cm cyst within the interpolar region of the left kidney. Stomach/Bowel: No abnormal bowel wall thickening or evidence for bowel obstruction. Vascular/Lymphatic: Normal caliber abdominal aorta. Multiple enlarged porta hepatic and upper abdominal lymph nodes including a 1.4 cm porta hepatic lymph node (image 22; series 4). Other: None. Musculoskeletal: No aggressive or acute appearing osseous lesions. Lumbar spinal fusion hardware. IMPRESSION: Interval development of intrahepatic and extrahepatic biliary ductal dilatation. Multiple small filling defects are demonstrated within the common bile duct suggestive of choledocholithiasis. Additionally there is cholelithiasis as well as  gallbladder wall thickening and small amount of associated pericholecystic edema concerning for acute cholecystitis. Nonspecific porta hepatic and upper abdominal adenopathy, potentially reactive in etiology. Recommend clinical and laboratory correlation. Patchy consolidation within the lingula and right lower lobe may represent infection or atelectasis. Continued radiographic followup to ensure resolution is recommended. Electronically Signed   By: Lovey Newcomer M.D.   On: 06/03/2016 20:05  Ct Angio Chest Aorta W/cm &/or Wo/cm  Result Date: 05/29/2016 CLINICAL DATA:  Chest pain starting 3 a.m., nausea, abnormal chest x-ray, history of bladder cancer EXAM: CT ANGIOGRAPHY CHEST without and WITH CONTRAST TECHNIQUE: Multidetector CT imaging of the chest was performed using the standard protocol during bolus administration of intravenous contrast. Multiplanar CT image reconstructions and MIPs were obtained to evaluate the vascular anatomy. Unenhanced images of the chest were provided. CONTRAST:  100 cc Isovue COMPARISON:  CT chest 03/21/2010 FINDINGS: Cardiovascular: Unenhanced images shows atherosclerotic calcifications of thoracic aorta and coronary arteries. Atherosclerotic calcifications are noted upper abdominal aorta, SMA origin, os celiac trunk origin and bilateral renal artery origin. Heart size within normal limits. No pericardial effusion. Arterial phase enhanced images demonstrates no evidence of aortic aneurysm or aortic dissection. The study is of excellent technical quality. No pulmonary embolus is noted. Pulmonary veins are unremarkable. Mediastinum/Nodes: There  is no mediastinal hematoma or adenopathy. No hilar adenopathy is noted. Lungs/Pleura: Images of the lung parenchyma shows bilateral emphysematous changes especially in upper lobes. As noted on the chest x-ray there is triangular shape consolidation in lingula anteromedial with some air bronchogram. Some mucus bronchial plugging is noted in axial  image 44. Findings are highly suspicious for small focal pneumonia. Additional focal infiltrate/ pneumonia noted in right middle lobe medially and laterally please see axial image 70. Axial image 69 there is 6 mm nodule in left base/left lower lobe anteriorly. Upper Abdomen: The visualized upper abdomen shows stable thickening bilateral adrenal glands left greater than right. There is fatty infiltration of the liver. No calcified gallstones are noted within visualize gallbladder. Visualized pancreas and spleen is unremarkable. Visualized upper kidneys are unremarkable. Musculoskeletal: Sagittal images of the spine shows degenerative changes thoracic spine. Sagittal view of the sternum is unremarkable. Review of the MIP images confirms the above findings. IMPRESSION: 1. No aortic aneurysm or aortic dissection.  No pulmonary embolus. 2. Atherosclerotic calcifications of thoracic aorta, abdominal aorta, celiac trunk and SMA origin, bilateral renal artery origin. Atherosclerotic calcifications of coronary arteries. 3. No mediastinal hematoma or adenopathy. There is small somewhat triangular-shaped consolidation in lingula anteromedially with some air bronchogram. Findings highly suspicious for focal pneumonia. Additional focal infiltrate/pneumonia noted in right middle lobe laterally and medially. Follow-up to resolution after treatment is recommended. 4. There is 6 mm nodule in left base anteriorly/ left lower lobe. Non-contrast chest CT at 6-12 months is recommended. If the nodule is stable at time of repeat CT, then future CT at 18-24 months (from today's scan) is considered optional for low-risk patients, but is recommended for high-risk patients. This recommendation follows the consensus statement: Guidelines for Management of Incidental Pulmonary Nodules Detected on CT Images:From the Fleischner Society 2017; published online before print (10.1148/radiol.IJ:2314499). 5. Bilateral emphysematous changes are noted.  6. Fatty infiltration of the liver. Stable bilateral thickening of adrenal glands. Electronically Signed   By: Lahoma Crocker M.D.   On: 05/29/2016 08:07    Time Spent in minutes  30   Malcolm Quast K M.D on 06/07/2016 at 9:52 AM  Between 7am to 7pm - Pager - [phone]  After 7pm go to [email or web address] - password [facility]  [facility] -  Office  [phone]

## 2016-06-07 NOTE — Progress Notes (Signed)
1 Day Post-Op  Subjective:  Doing well postop. Asking if he can go home today  He has tolerated some sips of liquids but really has not eaten anything. Able to void without difficulty Has not ambulated since surgery. Stable and alert.  Afebrile. Lab work this morning shows WBC 16,100, hemoglobin 11.9.  Potassium 3.3 yesterday afternoon.  295 mL out drain since surgery.  Nonbilious.   Objective: Vital signs in last 24 hours: Temp:  [98 F (36.7 C)-98.9 F (37.2 C)] 98 F (36.7 C) (07/27 0536) Pulse Rate:  [65-76] 71 (07/27 0536) Resp:  [15-21] 18 (07/27 0536) BP: (154-179)/(59-74) 162/69 (07/27 0536) SpO2:  [93 %-100 %] 99 % (07/27 0536) Last BM Date: 06/06/16  Intake/Output from previous day: 07/26 0701 - 07/27 0700 In: 700 [I.V.:700] Out: 1615 [Urine:1300; Drains:295; Blood:20] Intake/Output this shift: Total I/O In: -  Out: [id number] [Urine:700; Drains:75]   EXAM: General: Alert.  Oriented.  Mental status normal.  No distress.  Slightly deconditioned Lungs: Clear to auscultation bilaterally Abdomen: Soft.  Minimally tender.  Wounds look fine.  JP drainage serosanguineous.  No bilious drainage.  295 mL out since surgery.  Lab Results:  Results for orders placed or performed during the hospital encounter of 06/02/16 (from the past 24 hour(s))  Surgical pcr screen     Status: None   Collection Time: 06/06/16 10:39 AM  Result Value Ref Range   MRSA, PCR NEGATIVE NEGATIVE   Staphylococcus aureus NEGATIVE NEGATIVE  Magnesium     Status: Abnormal   Collection Time: 06/06/16  5:57 PM  Result Value Ref Range   Magnesium 1.6 (L) 1.7 - 2.4 mg/dL  Potassium     Status: Abnormal   Collection Time: 06/06/16  5:57 PM  Result Value Ref Range   Potassium 3.3 (L) 3.5 - 5.1 mmol/L  CBC     Status: Abnormal   Collection Time: 06/07/16  5:27 AM  Result Value Ref Range   WBC 16.1 (H) 4.0 - 10.5 K/uL   RBC 4.02 (L) 4.22 - 5.81 MIL/uL   Hemoglobin 11.9 (L) 13.0 - 17.0 g/dL   HCT  36.3 (L) 39.0 - 52.0 %   MCV 90.3 78.0 - 100.0 fL   MCH 29.6 26.0 - 34.0 pg   MCHC 32.8 30.0 - 36.0 g/dL   RDW 13.5 11.5 - 15.5 %   Platelets 304 150 - 400 K/uL     Studies/Results: Dg Cholangiogram Operative  Result Date: 06/06/2016 CLINICAL DATA:  Cholecystectomy for cholecystitis and cholelithiasis. EXAM: INTRAOPERATIVE CHOLANGIOGRAM TECHNIQUE: Cholangiographic images from the C-arm fluoroscopic device were submitted for interpretation post-operatively. Please see the procedural report for the amount of contrast and the fluoroscopy time utilized. COMPARISON:  ERCP on 06/05/2016 FINDINGS: Intraoperative cholangiogram shows no evidence of residual filling defect in the common bile duct or biliary obstruction. Contrast enters the duodenum normally. Minimal amount of contrast extravasation at the level of the cystic duct stump. IMPRESSION: Unremarkable intraoperative cholangiogram. Electronically Signed   By: Aletta Edouard Ferrell.D.   On: 06/06/2016 17:06   . amLODipine  10 mg Oral Daily  . aspirin EC  325 mg Oral Daily  . carvedilol  3.125 mg Oral BID WC  . diatrizoate meglumine-sodium  30 mL Oral Once  . enoxaparin (LOVENOX) injection  40 mg Subcutaneous Daily  . feeding supplement (ENSURE ENLIVE)  237 mL Oral TID WC  . piperacillin-tazobactam (ZOSYN)  IV  3.375 g Intravenous Q8H  . pregabalin  75 mg Oral BID  .  sertraline  100 mg Oral Daily  . simvastatin  20 mg Oral q1800  . sodium chloride  1,000 mL Intravenous Once     Assessment/Plan: s/p Procedure(s): LAPAROSCOPIC CHOLECYSTECTOMY WITH INTRAOPERATIVE CHOLANGIOGRAM  Acute cholecystitis with choledocholithiasis POD #1 laparoscopic cholecystectomy with cholangiogram.  Cholangiogram normal.  Doing well.   Continue Zosyn. Advance diet Begin ambulation Leave drain I am not sure he'll be ready to go home this afternoon but it is possible  POD #2.  ERCP with sphincterotomy.  No per complications  Peripheral vascular  disease Coronary artery disease Hypertension Remote history CVA COPD. Hypokalemia, improving  Appreciate management of his multiple medical problems by [facility].   @PROBHOSP @  LOS: 5 days    Thomas Ferrell 06/07/2016

## 2016-06-07 NOTE — Progress Notes (Signed)
Educated patient on how to drain and charge JP drain. Patient demonstrated how to drain and charge JP drain.

## 2016-06-07 NOTE — Progress Notes (Signed)
Physical Therapy Treatment Patient Details Name: Thomas Ferrell MRN: [id number] DOB: 08/25/1941 Today's Date: 06/07/2016    History of Present Illness Thomas Ferrell  is a 75 y.o. male,  With history of PAD status post bilateral femoral surgery by Dr. 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 Patient found to have CAP.. S/P on 06/06/16  Laparoscopic cholecystectomy with intra-operative cholangiography    PT Comments    The patient progressed well today, requires some assist to don the shoes and brace due to post op abdominal pain. He  Ambulated to BR and x 60' with RW.  Continue PT while in acute care.  Follow Up Recommendations  Home health PT;Supervision/Assistance - 24 hour (patient states that he does not want HHPT)     Equipment Recommendations   (patient reports having a 4 wheeled RW)    Recommendations for Other Services       Precautions / Restrictions Precautions Precautions: Fall Required Braces or Orthoses: Other Brace/Splint Other Brace/Splint: has and AFO for L foot, very painful Left foot due to neuropathy  R JP drain   Mobility  Bed Mobility   Bed Mobility: Supine to Sit     Supine to sit: Min assist        Transfers Overall transfer level: Needs assistance Equipment used: Rolling walker (2 wheeled) Transfers: Sit to/from Stand Sit to Stand: Min assist         General transfer comment: Donned shoes and AFO in sitting, required assistance  as he could not reach the feet due to surgery. From bed, he pulled up on  therapist's hand and  no external assist from recliner where he pushed up with his arms. He used th bar in the bathroom from the  toilet.  Ambulation/Gait Ambulation/Gait assistance: Min guard Ambulation Distance (Feet): 20 Feet (then20' then 25') Assistive device: Rolling walker (2 wheeled) Gait Pattern/deviations: Step-to pattern     General Gait Details: frequent stops to straighten, work through the pain.    Stairs            Wheelchair Mobility    Modified Rankin (Stroke Patients Only)       Balance                                    Cognition Arousal/Alertness: Awake/alert Behavior During Therapy: WFL for tasks assessed/performed Overall Cognitive Status: Within Functional Limits for tasks assessed                 General Comments: much more normal MS today    Exercises      General Comments        Pertinent Vitals/Pain Pain Assessment: Faces Faces Pain Scale: Hurts even more Pain Location: abdoment Pain Descriptors / Indicators: Discomfort;Grimacing;Guarding Pain Intervention(s): Limited activity within patient's tolerance    Home Living               Home Equipment: Walker - 4 wheels      Prior Function            PT Goals (current goals can now be found in the care plan section) Progress towards PT goals: Progressing  toward goals    Frequency  Min 3X/week    PT Plan Current plan remains appropriate    Co-evaluation             End of Session   Activity Tolerance: Patient tolerated treatment well Patient left: in chair;with call bell/phone within reach;with chair alarm set     Time: 1118-1200 PT Time Calculation (min) (ACUTE ONLY): 42 min  Charges:  $Gait Training: 8-22 mins $Self Care/Home Management: 23-37                    G Codes:      Claretha Cooper 06/07/2016, 1:48 PM Tresa Endo PT [phone]

## 2016-06-07 NOTE — Progress Notes (Signed)
Spoke with patient at bedside, discussed orders and recommendations for [facility] services. Patient very adamant that he does not want [place] services. He states he has a cane, walker, and BSC at home. His wife assist him with bathing and ADL's. No other needs identified.

## 2016-06-08 ENCOUNTER — Inpatient Hospital Stay (HOSPITAL_COMMUNITY): Payer: Medicare Other

## 2016-06-08 DIAGNOSIS — R1011 Right upper quadrant pain: Secondary | ICD-10-CM

## 2016-06-08 LAB — COMPREHENSIVE METABOLIC PANEL
ALBUMIN: 2.7 g/dL — AB (ref 3.5–5.0)
ALT: 68 U/L — AB (ref 17–63)
AST: 38 U/L (ref 15–41)
Alkaline Phosphatase: 227 U/L — ABNORMAL HIGH (ref 38–126)
Anion gap: 8 (ref 5–15)
BUN: 10 mg/dL (ref 6–20)
CHLORIDE: 101 mmol/L (ref 101–111)
CO2: 28 mmol/L (ref 22–32)
CREATININE: 0.65 mg/dL (ref 0.61–1.24)
Calcium: 8 mg/dL — ABNORMAL LOW (ref 8.9–10.3)
GFR calc non Af Amer: >60 mL/min (ref >60)
GLUCOSE: 107 mg/dL — AB (ref 65–99)
Potassium: 3.2 mmol/L — ABNORMAL LOW (ref 3.5–5.1)
SODIUM: 137 mmol/L (ref 135–145)
Total Bilirubin: 1.4 mg/dL — ABNORMAL HIGH (ref 0.3–1.2)
Total Protein: 5.9 g/dL — ABNORMAL LOW (ref 6.5–8.1)

## 2016-06-08 LAB — CBC
HCT: 33.6 % — ABNORMAL LOW (ref 39.0–52.0)
Hemoglobin: 11 g/dL — ABNORMAL LOW (ref 13.0–17.0)
MCH: 30.1 pg (ref 26.0–34.0)
MCHC: 32.7 g/dL (ref 30.0–36.0)
MCV: 92.1 fL (ref 78.0–100.0)
PLATELETS: 268 10*3/uL (ref 150–400)
RBC: 3.65 MIL/uL — AB (ref 4.22–5.81)
RDW: 14 % (ref 11.5–15.5)
WBC: 15.9 10*3/uL — ABNORMAL HIGH (ref 4.0–10.5)

## 2016-06-08 LAB — MAGNESIUM: Magnesium: 1.7 mg/dL (ref 1.7–2.4)

## 2016-06-08 MED ORDER — MAGNESIUM SULFATE 2 GM/50ML IV SOLN
2.0000 g | Freq: Once | INTRAVENOUS | Status: AC
  Administered 2016-06-08: 2 g via INTRAVENOUS
  Filled 2016-06-08: qty 50

## 2016-06-08 MED ORDER — POTASSIUM CHLORIDE CRYS ER 20 MEQ PO TBCR
40.0000 meq | EXTENDED_RELEASE_TABLET | Freq: Four times a day (QID) | ORAL | Status: DC
  Administered 2016-06-08: 40 meq via ORAL
  Filled 2016-06-08: qty 2

## 2016-06-08 MED ORDER — FUROSEMIDE 10 MG/ML IJ SOLN
40.0000 mg | Freq: Once | INTRAMUSCULAR | Status: AC
Start: 2016-06-08 — End: 2016-06-08
  Administered 2016-06-08: 40 mg via INTRAVENOUS
  Filled 2016-06-08: qty 4

## 2016-06-08 MED ORDER — AMOXICILLIN-POT CLAVULANATE 875-125 MG PO TABS: 1.0000 | ORAL_TABLET | Freq: Two times a day (BID) | ORAL | 0 refills | Status: DC

## 2016-06-08 NOTE — Progress Notes (Signed)
Speech Language Pathology Treatment: Dysphagia  Patient Details Name: Thomas Ferrell MRN: [id number] DOB: July 30, 1941 Today's Date: 06/08/2016 Time: 0820-0830 SLP Time Calculation (min) (ACUTE ONLY): 10 min  Assessment / Plan / Recommendation Clinical Impression  Pt demonstrates tolerance of thin liquids without signs of aspiration. Pt reports he did not observe any coughing following PO since our visit yesterday. Explained signs of aspiration. No further SLP intervention needed, will sign off.    HPI HPI: [name] a 75 y.o.male,With history of PAD status post bilateral femoral surgery by Dr. 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 chest x-ray showed right-sided infiltrate, he had signs of sepsis with temperature of 103 and leukocytosis. Per MD denies any episodes of choking or problems with swallowing food or liquids.       SLP Plan  Continue with current plan of care     Recommendations  Diet recommendations: Thin liquid Liquids provided via: Cup;Straw Medication Administration: Whole meds with liquid Supervision: Patient able to self feed             Plan: Continue with current plan of care     [place] Darene Nappi, MA CCC-SLP [id number]  Lynann Beaver 06/08/2016, 10:20 AM

## 2016-06-08 NOTE — Care Management Note (Signed)
Case Management Note  Patient Details  Name: Thomas Ferrell MRN: [id number] Date of Birth: [birth year]  Subjective/Objective: Patient has agreed to HHC-HHRN/PT/aide ordered. Provided patient w/HHC agency list-he chose [facility]-used them in the past. [place] rep Santiago Glad able to accept,aware of [place] orders, & d/c. Patient declines [place] aide-states his wife will provide that service.Nsg notified of [place].No further CM needs.                  Action/Plan:d/c home w/HHC.   Expected Discharge Date:               Expected Discharge Plan:  [place]  In-House Referral:  NA  Discharge planning Services  CM Consult  Post Acute Care Choice:  Home Health Choice offered to:  Patient  DME Arranged:  N/A DME Agency:  NA  HH Arranged:  RN, PT, Nurse's Aide [place] Agency:  [place]  Status of Service:  Completed, signed off  If discussed at [place] of Stay Meetings, dates discussed:    Additional Comments:  Dessa Phi, RN 06/08/2016, 10:02 AM

## 2016-06-08 NOTE — Care Management Note (Signed)
Case Management Note  Patient Details  Name: Thomas Ferrell MRN: [id number] Date of Birth: 1941/02/25  Subjective/Objective: Noted 02 sats qualify for home 02-[facility] dme rep aware & following. Per Nsg, MD will decide if home 02 will be ordered after medical treatment.                   Action/Plan:d/c home w/HHC.   Expected Discharge Date:  06/05/16               Expected Discharge Plan:  [place]  In-House Referral:  NA  Discharge planning Services  CM Consult  Post Acute Care Choice:  Home Health Choice offered to:  Patient  DME Arranged:  N/A DME Agency:  NA  HH Arranged:  RN, PT, Nurse's Aide [place] Agency:  [place]  Status of Service:  Completed, signed off  If discussed at [place] of Stay Meetings, dates discussed:    Additional Comments:  Dessa Phi, RN 06/08/2016, 11:53 AM

## 2016-06-08 NOTE — Progress Notes (Signed)
SATURATION QUALIFICATIONS: (This note is used to comply with regulatory documentation for home oxygen)  Patient Saturations on Room Air at Rest =88%  Patient Saturations on Room Air while Ambulating = 86%  Patient Saturations on 2 Liters of oxygen while Ambulating = 90%  Please briefly explain why patient needs home oxygen:

## 2016-06-08 NOTE — Care Management Note (Signed)
Case Management Note  Patient Details  Name: PHILIPPOS ROTI MRN: [id number] Date of Birth: 11/08/1941  Subjective/Objective: MD ordered home 02. [facility] dme rep Jerm[place] aware of orders & d/c today to bring home 02 to rm prior d/c.                   Action/Plan:d/c home w/HHC/DME   Expected Discharge Date:                 Expected Discharge Plan:  [place]  In-House Referral:  NA  Discharge planning Services  CM Consult  Post Acute Care Choice:  Home Health Choice offered to:  Patient  DME Arranged:  Oxygen DME Agency:  [place] Arranged:  RN, PT, Nurse's Aide [place] Agency:  [place]  Status of Service:  Completed, signed off  If discussed at [place] of Stay Meetings, dates discussed:    Additional Comments:  Dessa Phi, RN 06/08/2016, 2:15 PM

## 2016-06-08 NOTE — Progress Notes (Signed)
2 Days Post-Op  Subjective: He looks good drain removed, drainage is clear and serous.  Site benzoin and steri strip applied.  Tolerating diet, pain well controlled.    Objective: Vital signs in last 24 hours: Temp:  [98.3 F (36.8 C)-99.1 F (37.3 C)] 99.1 F (37.3 C) (07/28 0435) Pulse Rate:  [69-75] 70 (07/28 0435) Resp:  [16] 16 (07/28 0435) BP: (128-153)/(52-65) 153/59 (07/28 0435) SpO2:  [90 %-97 %] 97 % (07/28 0435) Last BM Date: 06/08/16 480 PO 1275 urine 70 ml drain BM x 2  Afebrile, VSS LFT's improving none today WBC was still up some yesterday IOC: Normal IOC Intake/Output from previous day: 07/27 0701 - 07/28 0700 In: 1042.5 [P.O.:480; I.V.:562.5] Out: 1345 [Urine:1275; Drains:70] Intake/Output this shift: Total I/O In: 550 [IV Piggyback:550] Out: -   General appearance: alert, cooperative and no distress GI: soft, sore, sites OK, drain removed, clear serous fluid, tolerating diet.   Lab Results:   Recent Labs  06/07/16 0527 06/08/16 0520  WBC 16.1* 15.9*  HGB 11.9* 11.0*  HCT 36.3* 33.6*  PLT 304 268    BMET  Recent Labs  06/07/16 0527 06/08/16 0520  NA 136 137  K 3.6 3.2*  CL 100* 101  CO2 27 28  GLUCOSE 134* 107*  BUN 8 10  CREATININE 0.61 0.65  CALCIUM 8.3* 8.0*   PT/INR  Recent Labs  06/05/16 1731  LABPROT 15.9*  INR 1.30     Recent Labs Lab 06/04/16 0510 06/05/16 0515 06/06/16 0530 06/07/16 0527 06/08/16 0520  AST 150* 85* 49* 61* 38  ALT 184* 134* 103* 89* 68*  ALKPHOS 269* 314* 346* 309* 227*  BILITOT 5.0* 2.4* 2.1* 1.6* 1.4*  PROT 6.2* 6.6 6.9 6.4* 5.9*  ALBUMIN 2.8* 2.9* 3.2* 3.0* 2.7*     Lipase     Component Value Date/Time   LIPASE 17 05/29/2016 [id number]     Studies/Results: Dg Cholangiogram Operative  Result Date: 06/06/2016 CLINICAL DATA:  Cholecystectomy for cholecystitis and cholelithiasis. EXAM: INTRAOPERATIVE CHOLANGIOGRAM TECHNIQUE: Cholangiographic images from the C-arm fluoroscopic device  were submitted for interpretation post-operatively. Please see the procedural report for the amount of contrast and the fluoroscopy time utilized. COMPARISON:  ERCP on 06/05/2016 FINDINGS: Intraoperative cholangiogram shows no evidence of residual filling defect in the common bile duct or biliary obstruction. Contrast enters the duodenum normally. Minimal amount of contrast extravasation at the level of the cystic duct stump. IMPRESSION: Unremarkable intraoperative cholangiogram. Electronically Signed   By: Aletta Edouard M.D.   On: 06/06/2016 17:06  Dg Chest Port 1 View  Result Date: 06/08/2016 CLINICAL DATA:  Shortness of Breath EXAM: PORTABLE CHEST 1 VIEW COMPARISON:  June 03, 2016 FINDINGS: Linear atelectatic change in the left mid lung is stable. There is also mild atelectasis in the right base. Lungs elsewhere clear. The heart is upper normal in size with pulmonary vascularity within normal limits. There is atherosclerotic calcification in the aorta. No adenopathy. There is degenerative change in the right shoulder with superior migration of the right humeral head. IMPRESSION: Atelectatic change in the left mid lung and right base regions. No frank airspace consolidation. Lungs elsewhere clear. Stable cardiac silhouette. There is aortic atherosclerosis. 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 Electronically Signed   By: Lowella Grip III M.D.   On: 06/08/2016 08:14   Medications: . amLODipine  10 mg Oral Daily  . aspirin EC  325 mg Oral Daily  .  carvedilol  3.125 mg Oral BID WC  . diatrizoate meglumine-sodium  30 mL Oral Once  . enoxaparin (LOVENOX) injection  40 mg Subcutaneous Daily  . feeding supplement (ENSURE ENLIVE)  237 mL Oral TID WC  . magnesium sulfate 1 - 4 g bolus IVPB  2 g Intravenous Once  . piperacillin-tazobactam (ZOSYN)  IV  3.375 g Intravenous Q8H  . potassium chloride  40 mEq Oral Q6H  . pregabalin  75 mg Oral  BID  . sertraline  100 mg Oral Daily  . simvastatin  20 mg Oral q1800  . sodium chloride  1,000 mL Intravenous Once      Multiple medical issues Assessment/Plan Acute cholecystitis with choledocholithiasis  Improving LFT's ERCP with sphincterotomy 06/05/16, Dr. Amedeo Plenty Laparoscopic Cholecystectomy with IOC,  06/06/16, Dr. Harlow Asa FEN:  Soft diet ID:  Day 7 antibiotics, day 6 Zosyn.  Home on 5 more days of antibiotics DVT:  Lovenox/SCD   Plan:  Home when ready per Medicine.  He has follow up appointment and discharge instructions.  Dr. Harlow Asa would like for him to have 5 more days of abx at home.      LOS: 6 days    Thomas Ferrell 06/08/2016 [phone]

## 2016-06-08 NOTE — Discharge Summary (Signed)
[name] [id number] DOB: 29-Dec-1940 DOA: 06/02/2016  PCP: Orpah Melter, MD  Admit date: 06/02/2016  Discharge date: 06/08/2016  Admitted From: Home   Disposition:  Home   Recommendations for Outpatient Follow-up:   Follow up with PCP in 1-2 weeks  PCP Please obtain BMP/CBC, 2 view CXR in 1week,  (see Discharge instructions)   PCP Please follow up on the following pending results: None   Home Health: PT-RN   Equipment/Devices: O2 2lit [place]/min Consultations: [facility] - GI Discharge Condition: Stable  CODE STATUS: Full   Diet Recommendation: Soft   Chief Complaint  Patient presents with  . Pneumonia     Brief history of present illness from the day of admission and additional interim summary     [name] a 75 y.o.male,With history of PAD status post bilateral femoral surgery by Dr. 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 He was placed on azithromycin for 5 days which gave him diarrhea but his main symptoms persisted.  Came to the ER where chest x-ray showed right-sided infiltrate, he had signs of sepsis with temperature of 103 and leukocytosis, I was called to admit the patient for pneumonia causing sepsis. Besides above review of systems patient is stable, he appears nontoxic, denies any chest or abdominal pain. No new focal weakness. Does have diarrhea but no blood or mucus in stool.  Hospital issues addressed    1. Sepsis  due to community-acquired pneumonia which has failed azithromycin and possibly due to #2 below. Had persistent infiltrate in the right lower lobe, denies any episodes of choking or problems with swallowing food or liquids. Treated empirically with  Doxy, Vancomycin and Cefepime, clinically pneumonia has resolved. We will taper down antibiotics for #2 below only. All cultures negative thus far. He will qualify for home o2 2litNC/min, as he still has mild atelectasis, request PCP to check a 2 view CXR in 7-10 days along with CBC, BMP and reevaluate o2 need.   2. Subacute to chronic cholangitis with choledocholithiasis and ascending cholangitis -  Admission liver enzymes were stable, next day of admission he had some abdominal discomfort and liver enzymes spiked, CT abdomen and MRCP abdomen confirmed CBD stone with possible cholecystitis, was given bowel rest with Zosyn. GI & [facility] were called, he is post ERCP on 06/05/2016 with CBD stone removal and sphincterectomy by Dr Amedeo Plenty, now post cholecystectomy on 06/06/16. Clinically he feels better on antibiotics. DW [facility] 5 more days of PO ABX and DC home with JP drain.  Will continue antibiotics for 1 more day postop and monitor, likely discharge on 06/08/2016.  3. Prolonged QTC. Coreg, IV magnesium and telemetry monitor. QTc now improved  451 ms.  4. COPD. Stable no wheezing supportive care  5. Stroke. With poor baseline balance. Cane when ambulates.  6. Few coughing episodes while swallowing. Seen and cleared by speech.  7. Dyslipidemia. On statin.  8. Essential hypertension. Place on low-dose Coreg and as needed hydralazine, hold ACE inhibitor due to sepsis.  9. Azithromycin induced diarrhea - resolved, no BM in > 14hrs after admission.  10. Sepsis induced mild Trop leak - B blocker, ASA, pain free, EKG non acute, flat Troponin trend.  11. ? Murmur - appreciated by ER MD - i could not, TTE stable .  12. Leukocytosis - due to #1 and 2  above, also has some chronic leukocytosis going past in his chart for several months. Will monitor clinically.  13. Hypokalemia and hypomagnesemia- both replaced.   Discharge diagnosis     Principal Problem:   Pneumonia Active Problems:   Lumbago   PAD (peripheral artery disease) (HCC)   Atherosclerotic PVD with ulceration (HCC)   Abdominal pain   Common bile duct (CBD) obstruction   Cholelithiasis with acute cholecystitis    Discharge instructions    Discharge Instructions    Discharge instructions    Complete by:  As directed   Follow with Primary MD Orpah Melter, MD in 3-4 days   Get CBC, CMP, 2 view Chest X ray checked  by Primary MD or SNF MD in 5-7 days ( we routinely change or add medications that can affect your baseline labs and fluid status, therefore we recommend that you get the mentioned basic workup next visit with your PCP, your PCP may decide not to get them or add new tests based on their clinical decision)   Activity: As tolerated with Full fall precautions use walker/cane & assistance as needed   Disposition Home    Diet:   Soft with feeding assistance and aspiration precautions.  For Heart failure patients - Check your Weight same time everyday, if you gain over 2 pounds, or you develop in leg swelling, experience more shortness of breath or chest pain, call your Primary MD immediately. Follow Cardiac Low Salt Diet and 1.5 lit/day fluid restriction.   On your next visit with your primary care physician please Get Medicines reviewed and adjusted.   Please request your Prim.MD to go over all Hospital Tests and Procedure/Radiological results at the follow up, please get all Hospital records sent to your Prim MD by signing hospital release before you go home.   If you experience worsening of your admission symptoms, develop shortness of breath, life threatening emergency, suicidal or homicidal thoughts you must seek medical attention immediately by  calling 911 or calling your MD immediately  if symptoms less severe.  You Must read complete instructions/literature along with all the possible adverse reactions/side effects for all the Medicines you take and that have been prescribed to you. Take any new Medicines after you have completely understood and accpet all the possible adverse reactions/side effects.   Do not drive, operate heavy machinery, perform activities at heights, swimming or participation in water activities or provide baby sitting services if your were admitted for syncope or siezures until you have seen by Primary MD or a Neurologist and advised to do so again.  Do not drive when taking Pain medications.    Do not take more than prescribed Pain, Sleep and Anxiety Medications  Special Instructions: If you have smoked or chewed Tobacco  in the last 2 yrs please stop smoking, stop any regular  Alcohol  and or any Recreational drug use.  Wear Seat belts while driving.   Please note  You were cared for by a hospitalist during your hospital stay. If you have any questions about your discharge medications or the care you received while you were in the hospital after you are discharged, you can call the unit and asked to speak with the hospitalist on call if the hospitalist that took care of you is not available. Once you are discharged, your primary care physician will handle any further medical issues. Please note that NO REFILLS for any discharge medications will be authorized once you are discharged, as it is imperative that you return to your primary care physician (or establish a relationship with a primary care physician if you do not have one) for your aftercare needs so that they can reassess your need for medications and monitor your lab values.   Increase activity slowly    Complete by:  As directed      Discharge Medications     Medication List    TAKE these medications   acetaminophen 500 MG tablet Commonly  known as:  TYLENOL Take 1,000 mg by mouth every 6 (six) hours as needed for mild pain or headache.   amLODipine 10 MG tablet Commonly known as:  NORVASC Take 10 mg by mouth daily.   amoxicillin-clavulanate 875-125 MG tablet Commonly known as:  AUGMENTIN Take 1 tablet by mouth 2 (two) times daily.   aspirin EC 325 MG tablet Take 325 mg by mouth daily.   cholecalciferol 1000 units tablet Commonly known as:  VITAMIN D Take 1,000 Units by mouth daily.   feeding supplement (ENSURE COMPLETE) Liqd Take 237 mLs by mouth 3 (three) times daily with meals.   ferrous sulfate 325 (65 FE) MG EC tablet Take 325 mg by mouth daily with breakfast.   lisinopril 40 MG tablet Commonly known as:  PRINIVIL,ZESTRIL Take 40 mg by mouth daily.   oxyCODONE 15 MG immediate release tablet Commonly known as:  ROXICODONE Take 1 tablet (15 mg total) by mouth every 4 (four) hours as needed for pain.   potassium chloride SA 20 MEQ tablet Commonly known as:  K-DUR,KLOR-CON Take 1 tablet (20 mEq total) by mouth daily.   pregabalin 75 MG capsule Commonly known as:  LYRICA Take 75 mg by mouth 2 (two) times daily.   sertraline 100 MG tablet Commonly known as:  ZOLOFT Take 100 mg by mouth daily.   simvastatin 20 MG tablet Commonly known as:  ZOCOR Take 20 mg by mouth daily.       Allergies  Allergen Reactions  . Morphine And Related Itching    Follow-up Information    [facility] .   Why:  HHRN/PT/aide Contact information: [place] [address] [phone]        [facility] .   Contact information: [address] [place] [address] [phone]        [facility] Follow up on 06/20/2016.   Specialty:  General Surgery Why:  Your appointment is at 10:00 AM, be at the office 30 minutes early for check in. Contact information: [address] [place] [address] [phone]         Orpah Melter, MD. Schedule an appointment as soon as possible for a visit in 4 day(s).   Specialty:  Family Medicine Contact information: [place] [address]  [facility] .   Why:  home 0xygen Contact information: [address] [place] [address] [phone]           Major procedures and Radiology Reports - PLEASE review detailed and final reports thoroughly  -      CT chest - lung nodule + R.infilterate  TTE - Mild LVH with LVEF 55-60%. Grade 1 diastolic dysfunction with normal estimated LV filling pressure. MAC with trivial mitral regurgitation. Moderately sclerotic aortic valve as outlined above. Trivial tricuspid regurgitation with PASP 38 mmHg. Mildly dilated RV with preserved contraction.  CT Abd - Pelvis - Possible subacute to chronic cholecystitis with CBD stone  MRI Abdomen/MRCP. Possible subacute to chronic cholecystitis with CBD stone  Laparoscopic cholecystectomy with right upper quadrant JP drain placement by general surgery on 06/06/2016.  Dg Chest 2 View  Result Date: 06/03/2016 CLINICAL DATA:  75 year old male with fever and cough, pneumonia. EXAM: CHEST  2 VIEW COMPARISON:  06/02/2016 FINDINGS: The cardiomediastinal silhouette is unremarkable. Streaky lingular opacity is unchanged. Mild peribronchial thickening is again identified. Minimal bibasilar opacities/atelectasis again noted. There is no evidence of pneumothorax, pleural effusion or acute bony abnormality. IMPRESSION: Unchanged appearance of the chest with streaky lingular opacity and mild basilar opacity/atelectasis. Electronically Signed   By: Margarette Canada M.D.   On: 06/03/2016 16:27  Dg Chest 2 View  Result Date: 06/02/2016 CLINICAL DATA:  Fever and cough EXAM: CHEST  2 VIEW COMPARISON:  May 29, 2016 FINDINGS: Persistent lingular opacity. There is also mild opacity in the right base which is unchanged. No other  interval changes. IMPRESSION: Persistent infiltrates in the lingula and right base. Recommend follow-up to resolution. Electronically Signed   By: Dorise Bullion III M.D   On: 06/02/2016 16:51   Dg Chest 2 View  Result Date: 05/29/2016 CLINICAL DATA:  Chest pain EXAM: CHEST  2 VIEW COMPARISON:  12/22/2013 FINDINGS: There is a new rounded opacity near the left hilum with neighboring atelectasis or scar. There is no edema, consolidation, effusion, or pneumothorax. Chronic bronchitic markings. Normal heart size and aortic contours. Aortic atherosclerosis. IMPRESSION: 1. Left perihilar nodule.  Recommend chest CT. 2. History of COPD.  Stable bronchitic markings. Electronically Signed   By: Monte Fantasia M.D.   On: 05/29/2016 06:38   Dg Cholangiogram Operative  Result Date: 06/06/2016 CLINICAL DATA:  Cholecystectomy for cholecystitis and cholelithiasis. EXAM: INTRAOPERATIVE CHOLANGIOGRAM TECHNIQUE: Cholangiographic images from the C-arm fluoroscopic device were submitted for interpretation post-operatively. Please see the procedural report for the amount of contrast and the fluoroscopy time utilized. COMPARISON:  ERCP on 06/05/2016 FINDINGS: Intraoperative cholangiogram shows no evidence of residual filling defect in the common bile duct or biliary obstruction. Contrast enters the duodenum normally. Minimal amount of contrast extravasation at the level of the cystic duct stump. IMPRESSION: Unremarkable intraoperative cholangiogram. Electronically Signed   By: Aletta Edouard M.D.   On: 06/06/2016 17:06  Ct Abdomen Pelvis W Contrast  Result Date: 06/03/2016 CLINICAL DATA:  Chronic upper abdominal and back pain for weeks. Sepsis. EXAM: CT ABDOMEN AND PELVIS WITH CONTRAST TECHNIQUE: Multidetector CT imaging of the abdomen and pelvis was performed using the standard protocol following bolus administration of intravenous contrast. CONTRAST:  137mL ISOVUE-300 IOPAMIDOL (ISOVUE-300) INJECTION 61% COMPARISON:  CT  abdomen dated 09/16/2015. FINDINGS: Lower chest: Patchy small consolidations at each lung base, most likely atelectasis. Hepatobiliary: Significant thickening of the gallbladder walls, similar to the appearance on  earlier CT abdomen of 09/16/2015. At least mildly increased pericholecystic edema. New common bile duct dilatation and prominent intrahepatic bile duct dilatation. Questionable small stones within the distal common bile duct. No focal mass or lesion within the liver. Pancreas: No mass, inflammatory changes, or other significant abnormality. Spleen: Within normal limits in size and appearance. Adrenals/Urinary Tract: Adrenal glands are bulbous in configuration, similar to previous exams, without circumscribed mass. Left renal cysts. No renal stone or hydronephrosis bilaterally. No perinephric inflammation. No ureteral or bladder calculi identified. Bladder is unremarkable, partially decompressed. Stomach/Bowel: Bowel is normal in caliber. No bowel wall thickening or evidence of bowel wall inflammation. Appendix is not seen but there are no inflammatory changes about the cecum to suggest acute appendicitis. Stomach appears normal. Vascular/Lymphatic: Heavy atherosclerotic changes of the normal caliber abdominal aorta, aortic branch vessels and pelvic vasculature. Small and mildly prominent lymph nodes noted within the upper abdomen. No enlarged lymph nodes within the lower abdomen or pelvis. Reproductive: No mass or other significant abnormality. Other: Trace free fluid in the lower pelvis. Trace fluid at the lower margin of the liver. No abscess collections seen. No free intraperitoneal air. Musculoskeletal: No acute or suspicious osseous finding. Fixation hardware within the lumbar spine appears appropriately positioned. Superficial soft tissues are unremarkable for acute process. 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 IMPRESSION: 1. New common bile duct and intrahepatic bile duct dilatation. Questionable small  stones within the distal common bile duct but not definitive. Recommend further characterization with ERCP or MRCP. 2. Prominent gallbladder wall thickening and pericholecystic edema. The gallbladder wall thickening is similar to findings on earlier CT abdomen of 09/16/2015, suggesting recurrent versus chronic cholecystitis. Pericholecystic edema is slightly more prominent on today's exam. 3. Mildly prominent lymph nodes within the upper abdomen, likely reactive in nature. 4. Aortic atherosclerosis. Additional chronic/incidental findings detailed above. These results were called by telephone at the time of interpretation on 06/03/2016 at 3:33 pm to Dr. Lala Lund , who verbally acknowledged these results. Electronically Signed   By: Franki Cabot M.D.   On: 06/03/2016 15:40  Mr 3d Recon At Scanner  Result Date: 06/03/2016 CLINICAL DATA:  Patient with new common bile duct dilatation. Evaluate for choledocholithiasis. EXAM: MRI ABDOMEN WITHOUT AND WITH CONTRAST (INCLUDING MRCP) TECHNIQUE: Multiplanar multisequence MR imaging of the abdomen was performed both before and after the administration of intravenous contrast. Heavily T2-weighted images of the biliary and pancreatic ducts were obtained, and three-dimensional MRCP images were rendered by post processing. CONTRAST:  70mL MULTIHANCE GADOBENATE DIMEGLUMINE 529 MG/ML IV SOLN COMPARISON:  CT abdomen pelvis 06/03/2016; 09/16/2015 FINDINGS: Lower chest: Patchy consolidation within the lingula and right lower lobe. No pleural effusion. Normal heart size. Hepatobiliary: The liver is normal in size and contour. No focal hepatic lesion is identified. There is circumferential wall thickening of the gallbladder with mild surrounding edema. New intrahepatic and extrahepatic biliary ductal dilatation. Common bile duct measures up to 9 mm. There are a few tiny filling defects demonstrated within the common bile duct within the mid aspect and distal aspect (image 28;  series 6) concerning for choledocholithiasis. Pancreas: Unremarkable. Spleen: Unremarkable Adrenals/Urinary Tract: Unchanged thickening of the bilateral adrenal glands. Kidneys enhance symmetrically with contrast. There is a 2.6 cm cyst within the interpolar region of the left kidney. Stomach/Bowel: No abnormal bowel wall thickening or evidence for bowel obstruction. Vascular/Lymphatic: Normal caliber abdominal aorta. Multiple enlarged porta hepatic and upper abdominal lymph nodes including a 1.4 cm porta hepatic lymph node (image 22; series  4). Other: None. Musculoskeletal: No aggressive or acute appearing osseous lesions. Lumbar spinal fusion hardware. IMPRESSION: Interval development of intrahepatic and extrahepatic biliary ductal dilatation. Multiple small filling defects are demonstrated within the common bile duct suggestive of choledocholithiasis. Additionally there is cholelithiasis as well as gallbladder wall thickening and small amount of associated pericholecystic edema concerning for acute cholecystitis. Nonspecific porta hepatic and upper abdominal adenopathy, potentially reactive in etiology. Recommend clinical and laboratory correlation. Patchy consolidation within the lingula and right lower lobe may represent infection or atelectasis. Continued radiographic followup to ensure resolution is recommended. Electronically Signed   By: Lovey Newcomer M.D.   On: 06/03/2016 20:05  Dg Chest Port 1 View  Result Date: 06/08/2016 CLINICAL DATA:  Shortness of Breath EXAM: PORTABLE CHEST 1 VIEW COMPARISON:  June 03, 2016 FINDINGS: Linear atelectatic change in the left mid lung is stable. There is also mild atelectasis in the right base. Lungs elsewhere clear. The heart is upper normal in size with pulmonary vascularity within normal limits. There is atherosclerotic calcification in the aorta. No adenopathy. There is degenerative change in the right shoulder with superior migration of the right humeral head.  IMPRESSION: Atelectatic change in the left mid lung and right base regions. No frank airspace consolidation. Lungs elsewhere clear. Stable cardiac silhouette. There is aortic atherosclerosis. 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 Electronically Signed   By: Lowella Grip III M.D.   On: 06/08/2016 08:14  Dg Ercp Biliary & Pancreatic Ducts  Result Date: 06/05/2016 CLINICAL DATA:  Choledocholithiasis. EXAM: ERCP TECHNIQUE: Multiple spot images obtained with the fluoroscopic device and submitted for interpretation post-procedure. COMPARISON:  MRI and MRCP study on 06/03/2016 FINDINGS: Imaging obtained with a C-arm during the ERCP study demonstrates a dilated common bile duct containing filling defects. A balloon sweep maneuver was performed to extract calculi. Completion cholangiogram shows no further visualized filling defects. IMPRESSION: ERCP demonstrates filling defects in the common bile duct consistent with choledocholithiasis. A balloon sweep maneuver was performed to extract calculi. These images were submitted for radiologic interpretation only. Please see the procedural report for the amount of contrast and the fluoroscopy time utilized. Electronically Signed   By: Aletta Edouard M.D.   On: 06/05/2016 13:40  Mr Jeananne Rama W/wo Cm/mrcp  Result Date: 06/03/2016 CLINICAL DATA:  Patient with new common bile duct dilatation. Evaluate for choledocholithiasis. EXAM: MRI ABDOMEN WITHOUT AND WITH CONTRAST (INCLUDING MRCP) TECHNIQUE: Multiplanar multisequence MR imaging of the abdomen was performed both before and after the administration of intravenous contrast. Heavily T2-weighted images of the biliary and pancreatic ducts were obtained, and three-dimensional MRCP images were rendered by post processing. CONTRAST:  66mL MULTIHANCE GADOBENATE DIMEGLUMINE 529 MG/ML IV SOLN COMPARISON:  CT abdomen pelvis 06/03/2016; 09/16/2015 FINDINGS: Lower chest:  Patchy consolidation within the lingula and right lower lobe. No pleural effusion. Normal heart size. Hepatobiliary: The liver is normal in size and contour. No focal hepatic lesion is identified. There is circumferential wall thickening of the gallbladder with mild surrounding edema. New intrahepatic and extrahepatic biliary ductal dilatation. Common bile duct measures up to 9 mm. There are a few tiny filling defects demonstrated within the common bile duct within the mid aspect and distal aspect (image 28; series 6) concerning for choledocholithiasis. Pancreas: Unremarkable. Spleen: Unremarkable Adrenals/Urinary Tract: Unchanged thickening of the bilateral adrenal glands. Kidneys enhance symmetrically with contrast. There is a 2.6 cm cyst within the interpolar region of the left kidney. Stomach/Bowel:  No abnormal bowel wall thickening or evidence for bowel obstruction. Vascular/Lymphatic: Normal caliber abdominal aorta. Multiple enlarged porta hepatic and upper abdominal lymph nodes including a 1.4 cm porta hepatic lymph node (image 22; series 4). Other: None. Musculoskeletal: No aggressive or acute appearing osseous lesions. Lumbar spinal fusion hardware. IMPRESSION: Interval development of intrahepatic and extrahepatic biliary ductal dilatation. Multiple small filling defects are demonstrated within the common bile duct suggestive of choledocholithiasis. Additionally there is cholelithiasis as well as gallbladder wall thickening and small amount of associated pericholecystic edema concerning for acute cholecystitis. Nonspecific porta hepatic and upper abdominal adenopathy, potentially reactive in etiology. Recommend clinical and laboratory correlation. Patchy consolidation within the lingula and right lower lobe may represent infection or atelectasis. Continued radiographic followup to ensure resolution is recommended. Electronically Signed   By: Lovey Newcomer M.D.   On: 06/03/2016 20:05  Ct Angio Chest Aorta  W/cm &/or Wo/cm  Result Date: 05/29/2016 CLINICAL DATA:  Chest pain starting 3 a.m., nausea, abnormal chest x-ray, history of bladder cancer EXAM: CT ANGIOGRAPHY CHEST without and WITH CONTRAST TECHNIQUE: Multidetector CT imaging of the chest was performed using the standard protocol during bolus administration of intravenous contrast. Multiplanar CT image reconstructions and MIPs were obtained to evaluate the vascular anatomy. Unenhanced images of the chest were provided. CONTRAST:  100 cc Isovue COMPARISON:  CT chest 03/21/2010 FINDINGS: Cardiovascular: Unenhanced images shows atherosclerotic calcifications of thoracic aorta and coronary arteries. Atherosclerotic calcifications are noted upper abdominal aorta, SMA origin, os celiac trunk origin and bilateral renal artery origin. Heart size within normal limits. No pericardial effusion. Arterial phase enhanced images demonstrates no evidence of aortic aneurysm or aortic dissection. The study is of excellent technical quality. No pulmonary embolus is noted. Pulmonary veins are unremarkable. Mediastinum/Nodes: There is no mediastinal hematoma or adenopathy. No hilar adenopathy is noted. Lungs/Pleura: Images of the lung parenchyma shows bilateral emphysematous changes especially in upper lobes. As noted on the chest x-ray there is triangular shape consolidation in lingula anteromedial with some air bronchogram. Some mucus bronchial plugging is noted in axial image 44. Findings are highly suspicious for small focal pneumonia. Additional focal infiltrate/ pneumonia noted in right middle lobe medially and laterally please see axial image 70. Axial image 69 there is 6 mm nodule in left base/left lower lobe anteriorly. Upper Abdomen: The visualized upper abdomen shows stable thickening bilateral adrenal glands left greater than right. There is fatty infiltration of the liver. No calcified gallstones are noted within visualize gallbladder. Visualized pancreas and spleen  is unremarkable. Visualized upper kidneys are unremarkable. Musculoskeletal: Sagittal images of the spine shows degenerative changes thoracic spine. Sagittal view of the sternum is unremarkable. Review of the MIP images confirms the above findings. IMPRESSION: 1. No aortic aneurysm or aortic dissection.  No pulmonary embolus. 2. Atherosclerotic calcifications of thoracic aorta, abdominal aorta, celiac trunk and SMA origin, bilateral renal artery origin. Atherosclerotic calcifications of coronary arteries. 3. No mediastinal hematoma or adenopathy. There is small somewhat triangular-shaped consolidation in lingula anteromedially with some air bronchogram. Findings highly suspicious for focal pneumonia. Additional focal infiltrate/pneumonia noted in right middle lobe laterally and medially. Follow-up to resolution after treatment is recommended. 4. There is 6 mm nodule in left base anteriorly/ left lower lobe. Non-contrast chest CT at 6-12 months is recommended. If the nodule is stable at time of repeat CT, then future CT at 18-24 months (from today's scan) is considered optional for low-risk patients, but is recommended for high-risk patients. This recommendation follows the consensus statement:  Guidelines for Management of Incidental Pulmonary Nodules Detected on CT Images:From the Fleischner Society 2017; published online before print (10.1148/radiol.SG:[id number]). 5. Bilateral emphysematous changes are noted. 6. Fatty infiltration of the liver. Stable bilateral thickening of adrenal glands. Electronically Signed   By: Lahoma Crocker M.D.   On: 05/29/2016 08:07    Micro Results    Recent Results (from the past 240 hour(s))  Culture, blood (Routine x 2)     Status: None   Collection Time: 06/02/16  4:10 PM  Result Value Ref Range Status   Specimen Description BLOOD RIGHT ARM  5 ML IN [facility] BOTTLE  Final   Special Requests NONE  Final   Culture   Final    NO GROWTH 5 DAYS Performed at [facility]     Report Status 06/07/2016 FINAL  Final  Culture, blood (Routine x 2)     Status: None   Collection Time: 06/02/16  4:10 PM  Result Value Ref Range Status   Specimen Description BLOOD LEFT HAND  10 ML IN [facility] BOTTLE  Final   Special Requests NONE  Final   Culture   Final    NO GROWTH 5 DAYS Performed at [facility]    Report Status 06/07/2016 FINAL  Final  Urine culture     Status: None   Collection Time: 06/02/16  5:10 PM  Result Value Ref Range Status   Specimen Description URINE, CLEAN CATCH  Final   Special Requests NONE  Final   Culture NO GROWTH Performed at [facility]   Final   Report Status 06/03/2016 FINAL  Final  Nasal culture     Status: None   Collection Time: 06/03/16  7:50 PM  Result Value Ref Range Status   Specimen Description NOSE  Final   Special Requests NONE  Final   Culture   Final    NORMAL NASOPHARYNGEAL FLORA NO STAPHYLOCOCCUS AUREUS ISOLATED Performed at [facility]    Report Status 06/06/2016 FINAL  Final  C difficile quick scan w PCR reflex     Status: None   Collection Time: 06/05/16  6:52 AM  Result Value Ref Range Status   C Diff antigen NEGATIVE NEGATIVE Final   C Diff toxin NEGATIVE NEGATIVE Final   C Diff interpretation No C. difficile detected.  Final  Surgical pcr screen     Status: None   Collection Time: 06/06/16 10:39 AM  Result Value Ref Range Status   MRSA, PCR NEGATIVE NEGATIVE Final   Staphylococcus aureus NEGATIVE NEGATIVE Final    Comment:        The Xpert SA Assay (FDA approved for NASAL specimens in patients over 69 years of age), is one component of a comprehensive surveillance program.  Test performance has been validated by [facility] for patients greater than or equal to 36 year old. It is not intended to diagnose infection nor to guide or monitor treatment.     Today   Subjective    [place] today has no headache,no chest abdominal pain,no new weakness tingling or numbness,  feels much better wants to go home today.     Objective   Blood pressure (!) 153/59, pulse 70, temperature 99.1 F (37.3 C), temperature source Oral, resp. rate 16, height 5\' 4"  (1.626 m), weight 59 kg (130 lb), SpO2 97 %.   Intake/Output Summary (Last 24 hours) at 06/08/16 1525 Last data filed at 06/08/16 1410  Gross per 24 hour  Intake  1112.5 ml  Output             1670 ml  Net           -557.5 ml    Exam Awake Alert, Oriented x 3, No new F.N deficits, Normal affect [place].AT,PERRAL Supple Neck,No JVD, No cervical lymphadenopathy appriciated.  Symmetrical Chest wall movement, Good air movement bilaterally, CTAB RRR,No Gallops,Rubs or new Murmurs, No Parasternal Heave +ve B.Sounds, Abd Soft, Non tender, No organomegaly appriciated, No rebound -guarding or rigidity. RUQ JP drain No Cyanosis, Clubbing or edema, No new Rash or bruise   Data Review   CBC w Diff:  Lab Results  Component Value Date   WBC 15.9 (H) 06/08/2016   HGB 11.0 (L) 06/08/2016   HGB 13.3 07/18/2010   HCT 33.6 (L) 06/08/2016   HCT 37.8 (L) 07/18/2010   PLT 268 06/08/2016   PLT 190 07/18/2010   LYMPHOPCT 4 06/02/2016   LYMPHOPCT 13.5 (L) 07/18/2010   MONOPCT 3 06/02/2016   MONOPCT 11.0 07/18/2010   EOSPCT 0 06/02/2016   EOSPCT 0.8 07/18/2010   BASOPCT 0 06/02/2016   BASOPCT 0.5 07/18/2010    CMP:  Lab Results  Component Value Date   NA 137 06/08/2016   K 3.2 (L) 06/08/2016   CL 101 06/08/2016   CO2 28 06/08/2016   BUN 10 06/08/2016   CREATININE 0.65 06/08/2016   PROT 5.9 (L) 06/08/2016   ALBUMIN 2.7 (L) 06/08/2016   BILITOT 1.4 (H) 06/08/2016   ALKPHOS 227 (H) 06/08/2016   AST 38 06/08/2016   ALT 68 (H) 06/08/2016  .   Total Time in preparing paper work, data evaluation and todays exam - 35 minutes  Thurnell Lose M.D on 06/08/2016 at 3:25 PM  [facility]   Office  [phone]

## 2016-06-08 NOTE — Discharge Instructions (Signed)
Follow with Primary MD Orpah Melter, MD in 3-4 days   Get CBC, CMP, 2 view Chest X ray checked  by Primary MD or SNF MD in 5-7 days ( we routinely change or add medications that can affect your baseline labs and fluid status, therefore we recommend that you get the mentioned basic workup next visit with your PCP, your PCP may decide not to get them or add new tests based on their clinical decision)   Activity: As tolerated with Full fall precautions use walker/cane & assistance as needed   Disposition Home    Diet:   Soft with feeding assistance and aspiration precautions.  For Heart failure patients - Check your Weight same time everyday, if you gain over 2 pounds, or you develop in leg swelling, experience more shortness of breath or chest pain, call your Primary MD immediately. Follow Cardiac Low Salt Diet and 1.5 lit/day fluid restriction.   On your next visit with your primary care physician please Get Medicines reviewed and adjusted.   Please request your Prim.MD to go over all Hospital Tests and Procedure/Radiological results at the follow up, please get all Hospital records sent to your Prim MD by signing hospital release before you go home.   If you experience worsening of your admission symptoms, develop shortness of breath, life threatening emergency, suicidal or homicidal thoughts you must seek medical attention immediately by calling 911 or calling your MD immediately  if symptoms less severe.  You Must read complete instructions/literature along with all the possible adverse reactions/side effects for all the Medicines you take and that have been prescribed to you. Take any new Medicines after you have completely understood and accpet all the possible adverse reactions/side effects.   Do not drive, operate heavy machinery, perform activities at heights, swimming or participation in water activities or provide baby sitting services if your were admitted for syncope or  siezures until you have seen by Primary MD or a Neurologist and advised to do so again.  Do not drive when taking Pain medications.    Do not take more than prescribed Pain, Sleep and Anxiety Medications  Special Instructions: If you have smoked or chewed Tobacco  in the last 2 yrs please stop smoking, stop any regular Alcohol  and or any Recreational drug use.  Wear Seat belts while driving.   Please note  You were cared for by a hospitalist during your hospital stay. If you have any questions about your discharge medications or the care you received while you were in the hospital after you are discharged, you can call the unit and asked to speak with the hospitalist on call if the hospitalist that took care of you is not available. Once you are discharged, your primary care physician will handle any further medical issues. Please note that NO REFILLS for any discharge medications will be authorized once you are discharged, as it is imperative that you return to your primary care physician (or establish a relationship with a primary care physician if you do not have one) for your aftercare needs so that they can reassess your need for medications and monitor your lab values.         [facility] ______[facility] LAPAROSCOPIC SURGERY: POST OP INSTRUCTIONS Always review your discharge instruction sheet given to you by the facility where your surgery was performed. IF YOU HAVE DISABILITY OR FAMILY LEAVE FORMS, YOU MUST BRING THEM TO THE OFFICE FOR PROCESSING.   DO NOT GIVE THEM  TO YOUR DOCTOR.  1. A prescription for pain medication may be given to you upon discharge.  Take your pain medication as prescribed, if needed.  If narcotic pain medicine is not needed, then you may take acetaminophen (Tylenol) or ibuprofen (Advil) as needed. 2. Take your usually prescribed medications unless otherwise directed. 3. If you need a refill on your pain medication, please contact your  pharmacy.  They will contact our office to request authorization. Prescriptions will not be filled after 5pm or on week-ends. 4. You should follow a light diet the first few days after arrival home, such as soup and crackers, etc.  Be sure to include lots of fluids daily. 5. Most patients will experience some swelling and bruising in the area of the incisions.  Ice packs will help.  Swelling and bruising can take several days to resolve.  6. It is common to experience some constipation if taking pain medication after surgery.  Increasing fluid intake and taking a stool softener (such as Colace) will usually help or prevent this problem from occurring.  A mild laxative (Milk of Magnesia or Miralax) should be taken according to package instructions if there are no bowel movements after 48 hours. 7. Unless discharge instructions indicate otherwise, you may remove your bandages 24-48 hours after surgery, and you may shower at that time.  You may have steri-strips (small skin tapes) in place directly over the incision.  These strips should be left on the skin for 7-10 days.  If your surgeon used skin glue on the incision, you may shower in 24 hours.  The glue will flake off over the next 2-3 weeks.  Any sutures or staples will be removed at the office during your follow-up visit. 8. ACTIVITIES:  You may resume regular (light) daily activities beginning the next day--such as daily self-care, walking, climbing stairs--gradually increasing activities as tolerated.  You may have sexual intercourse when it is comfortable.  Refrain from any heavy lifting or straining until approved by your doctor. a. You may drive when you are no longer taking prescription pain medication, you can comfortably wear a seatbelt, and you can safely maneuver your car and apply brakes. b. RETURN TO WORK:  __________________________________________________________ 9. You should see your doctor in the office for a follow-up appointment  approximately 2-3 weeks after your surgery.  Make sure that you call for this appointment within a day or two after you arrive home to insure a convenient appointment time. 10. OTHER INSTRUCTIONS: __________________________________________________________________________________________________________________________ __________________________________________________________________________________________________________________________ WHEN TO CALL YOUR DOCTOR: 1. Fever over 101.0 2. Inability to urinate 3. Continued bleeding from incision. 4. Increased pain, redness, or drainage from the incision. 5. Increasing abdominal pain  The clinic staff is available to answer your questions during regular business hours.  Please dont hesitate to call and ask to speak to one of the nurses for clinical concerns.  If you have a medical emergency, go to the nearest emergency room or call 911.  A surgeon from [facility] is always on call at the hospital. [address], [place], [place], [place]  [address] ? [address], [place], [place]   [phone] ? [phone] ? FAX [phone] Web site: [email or web address]

## 2016-06-11 DIAGNOSIS — Z87891 Personal history of nicotine dependence: Secondary | ICD-10-CM | POA: Diagnosis not present

## 2016-06-11 DIAGNOSIS — Z8551 Personal history of malignant neoplasm of bladder: Secondary | ICD-10-CM | POA: Diagnosis not present

## 2016-06-11 DIAGNOSIS — E785 Hyperlipidemia, unspecified: Secondary | ICD-10-CM | POA: Diagnosis not present

## 2016-06-11 DIAGNOSIS — Z8673 Personal history of transient ischemic attack (TIA), and cerebral infarction without residual deficits: Secondary | ICD-10-CM | POA: Diagnosis not present

## 2016-06-11 DIAGNOSIS — R1310 Dysphagia, unspecified: Secondary | ICD-10-CM | POA: Diagnosis not present

## 2016-06-11 DIAGNOSIS — J440 Chronic obstructive pulmonary disease with acute lower respiratory infection: Secondary | ICD-10-CM | POA: Diagnosis not present

## 2016-06-11 DIAGNOSIS — J189 Pneumonia, unspecified organism: Secondary | ICD-10-CM | POA: Diagnosis not present

## 2016-06-11 DIAGNOSIS — I10 Essential (primary) hypertension: Secondary | ICD-10-CM | POA: Diagnosis not present

## 2016-06-11 DIAGNOSIS — G629 Polyneuropathy, unspecified: Secondary | ICD-10-CM | POA: Diagnosis not present

## 2016-06-11 DIAGNOSIS — Z48815 Encounter for surgical aftercare following surgery on the digestive system: Secondary | ICD-10-CM | POA: Diagnosis not present

## 2016-06-13 DIAGNOSIS — J189 Pneumonia, unspecified organism: Secondary | ICD-10-CM | POA: Diagnosis not present

## 2016-06-13 DIAGNOSIS — E876 Hypokalemia: Secondary | ICD-10-CM | POA: Diagnosis not present

## 2016-06-13 DIAGNOSIS — Z09 Encounter for follow-up examination after completed treatment for conditions other than malignant neoplasm: Secondary | ICD-10-CM | POA: Diagnosis not present

## 2016-06-25 DIAGNOSIS — M79672 Pain in left foot: Secondary | ICD-10-CM | POA: Diagnosis not present

## 2016-06-25 DIAGNOSIS — M545 Low back pain: Secondary | ICD-10-CM | POA: Diagnosis not present

## 2016-06-25 DIAGNOSIS — Z79891 Long term (current) use of opiate analgesic: Secondary | ICD-10-CM | POA: Diagnosis not present

## 2016-06-25 DIAGNOSIS — M79605 Pain in left leg: Secondary | ICD-10-CM | POA: Diagnosis not present

## 2016-06-25 DIAGNOSIS — G894 Chronic pain syndrome: Secondary | ICD-10-CM | POA: Diagnosis not present

## 2016-06-27 DIAGNOSIS — E876 Hypokalemia: Secondary | ICD-10-CM | POA: Diagnosis not present

## 2016-06-27 DIAGNOSIS — J189 Pneumonia, unspecified organism: Secondary | ICD-10-CM | POA: Diagnosis not present

## 2016-07-23 DIAGNOSIS — M79672 Pain in left foot: Secondary | ICD-10-CM | POA: Diagnosis not present

## 2016-07-23 DIAGNOSIS — M79605 Pain in left leg: Secondary | ICD-10-CM | POA: Diagnosis not present

## 2016-07-23 DIAGNOSIS — M545 Low back pain: Secondary | ICD-10-CM | POA: Diagnosis not present

## 2016-07-23 DIAGNOSIS — Z79891 Long term (current) use of opiate analgesic: Secondary | ICD-10-CM | POA: Diagnosis not present

## 2016-07-23 DIAGNOSIS — G894 Chronic pain syndrome: Secondary | ICD-10-CM | POA: Diagnosis not present

## 2016-07-25 DIAGNOSIS — Z23 Encounter for immunization: Secondary | ICD-10-CM | POA: Diagnosis not present

## 2016-08-02 ENCOUNTER — Encounter (HOSPITAL_COMMUNITY): Payer: PRIVATE HEALTH INSURANCE

## 2016-08-02 ENCOUNTER — Ambulatory Visit: Payer: PRIVATE HEALTH INSURANCE | Admitting: Family

## 2016-08-20 DIAGNOSIS — Z79891 Long term (current) use of opiate analgesic: Secondary | ICD-10-CM | POA: Diagnosis not present

## 2016-08-20 DIAGNOSIS — M79605 Pain in left leg: Secondary | ICD-10-CM | POA: Diagnosis not present

## 2016-08-20 DIAGNOSIS — M79672 Pain in left foot: Secondary | ICD-10-CM | POA: Diagnosis not present

## 2016-08-20 DIAGNOSIS — G894 Chronic pain syndrome: Secondary | ICD-10-CM | POA: Diagnosis not present

## 2016-08-20 DIAGNOSIS — M545 Low back pain: Secondary | ICD-10-CM | POA: Diagnosis not present

## 2016-09-18 DIAGNOSIS — M79604 Pain in right leg: Secondary | ICD-10-CM | POA: Diagnosis not present

## 2016-09-18 DIAGNOSIS — M79672 Pain in left foot: Secondary | ICD-10-CM | POA: Diagnosis not present

## 2016-09-18 DIAGNOSIS — Z79891 Long term (current) use of opiate analgesic: Secondary | ICD-10-CM | POA: Diagnosis not present

## 2016-09-18 DIAGNOSIS — M5417 Radiculopathy, lumbosacral region: Secondary | ICD-10-CM | POA: Diagnosis not present

## 2016-09-18 DIAGNOSIS — G8929 Other chronic pain: Secondary | ICD-10-CM | POA: Diagnosis not present

## 2016-10-16 DIAGNOSIS — Z79891 Long term (current) use of opiate analgesic: Secondary | ICD-10-CM | POA: Diagnosis not present

## 2016-11-19 DIAGNOSIS — R208 Other disturbances of skin sensation: Secondary | ICD-10-CM | POA: Diagnosis not present

## 2016-11-19 DIAGNOSIS — M545 Low back pain: Secondary | ICD-10-CM | POA: Diagnosis not present

## 2016-11-19 DIAGNOSIS — G894 Chronic pain syndrome: Secondary | ICD-10-CM | POA: Diagnosis not present

## 2016-11-19 DIAGNOSIS — M79604 Pain in right leg: Secondary | ICD-10-CM | POA: Diagnosis not present

## 2016-11-19 DIAGNOSIS — Z79891 Long term (current) use of opiate analgesic: Secondary | ICD-10-CM | POA: Diagnosis not present

## 2016-12-17 DIAGNOSIS — G894 Chronic pain syndrome: Secondary | ICD-10-CM | POA: Diagnosis not present

## 2016-12-17 DIAGNOSIS — Z79891 Long term (current) use of opiate analgesic: Secondary | ICD-10-CM | POA: Diagnosis not present

## 2016-12-17 DIAGNOSIS — M545 Low back pain: Secondary | ICD-10-CM | POA: Diagnosis not present

## 2016-12-17 DIAGNOSIS — G890 Central pain syndrome: Secondary | ICD-10-CM | POA: Diagnosis not present

## 2016-12-17 DIAGNOSIS — M79604 Pain in right leg: Secondary | ICD-10-CM | POA: Diagnosis not present

## 2016-12-17 DIAGNOSIS — R208 Other disturbances of skin sensation: Secondary | ICD-10-CM | POA: Diagnosis not present

## 2017-01-17 DIAGNOSIS — M79609 Pain in unspecified limb: Secondary | ICD-10-CM | POA: Diagnosis not present

## 2017-01-17 DIAGNOSIS — M25511 Pain in right shoulder: Secondary | ICD-10-CM | POA: Diagnosis not present

## 2017-01-17 DIAGNOSIS — M545 Low back pain: Secondary | ICD-10-CM | POA: Diagnosis not present

## 2017-01-17 DIAGNOSIS — R202 Paresthesia of skin: Secondary | ICD-10-CM | POA: Diagnosis not present

## 2017-01-17 DIAGNOSIS — G894 Chronic pain syndrome: Secondary | ICD-10-CM | POA: Diagnosis not present

## 2017-01-17 DIAGNOSIS — G8929 Other chronic pain: Secondary | ICD-10-CM | POA: Diagnosis not present

## 2017-01-21 ENCOUNTER — Other Ambulatory Visit: Payer: Self-pay | Admitting: Family Medicine

## 2017-01-21 DIAGNOSIS — M25511 Pain in right shoulder: Secondary | ICD-10-CM

## 2017-01-27 ENCOUNTER — Ambulatory Visit
Admission: RE | Admit: 2017-01-27 | Discharge: 2017-01-27 | Disposition: A | Discharge status: Home / Self Care | Payer: Medicare Other | Source: Ambulatory Visit | Attending: Family Medicine | Admitting: Family Medicine

## 2017-01-27 DIAGNOSIS — M25511 Pain in right shoulder: Secondary | ICD-10-CM

## 2017-02-01 ENCOUNTER — Encounter: Payer: Self-pay | Admitting: Family

## 2017-02-01 DIAGNOSIS — M25511 Pain in right shoulder: Secondary | ICD-10-CM | POA: Diagnosis not present

## 2017-02-01 DIAGNOSIS — M75101 Unspecified rotator cuff tear or rupture of right shoulder, not specified as traumatic: Secondary | ICD-10-CM | POA: Diagnosis not present

## 2017-02-07 ENCOUNTER — Ambulatory Visit (INDEPENDENT_AMBULATORY_CARE_PROVIDER_SITE_OTHER)
Admission: RE | Admit: 2017-02-07 | Discharge: 2017-02-07 | Disposition: A | Discharge status: Home / Self Care | Payer: Medicare Other | Source: Ambulatory Visit | Attending: Vascular Surgery | Admitting: Vascular Surgery

## 2017-02-07 ENCOUNTER — Ambulatory Visit (INDEPENDENT_AMBULATORY_CARE_PROVIDER_SITE_OTHER): Payer: Medicare Other | Admitting: Family

## 2017-02-07 ENCOUNTER — Ambulatory Visit (INDEPENDENT_AMBULATORY_CARE_PROVIDER_SITE_OTHER)
Admission: RE | Admit: 2017-02-07 | Discharge: 2017-02-07 | Disposition: A | Discharge status: Home / Self Care | Payer: Medicare Other | Source: Ambulatory Visit | Attending: Family | Admitting: Family

## 2017-02-07 ENCOUNTER — Encounter: Payer: Self-pay | Admitting: Family

## 2017-02-07 ENCOUNTER — Ambulatory Visit (HOSPITAL_COMMUNITY)
Admission: RE | Admit: 2017-02-07 | Discharge: 2017-02-07 | Disposition: A | Discharge status: Home / Self Care | Payer: Medicare Other | Source: Ambulatory Visit | Attending: Family | Admitting: Family

## 2017-02-07 VITALS — BP 128/75 | HR 70 | Temp 98.3°F | Resp 16 | Ht 64.0 in | Wt 130.0 lb

## 2017-02-07 DIAGNOSIS — I779 Disorder of arteries and arterioles, unspecified: Secondary | ICD-10-CM | POA: Diagnosis not present

## 2017-02-07 DIAGNOSIS — I739 Peripheral vascular disease, unspecified: Secondary | ICD-10-CM | POA: Diagnosis not present

## 2017-02-07 DIAGNOSIS — E785 Hyperlipidemia, unspecified: Secondary | ICD-10-CM | POA: Diagnosis not present

## 2017-02-07 DIAGNOSIS — Z87891 Personal history of nicotine dependence: Secondary | ICD-10-CM | POA: Diagnosis not present

## 2017-02-07 DIAGNOSIS — I6523 Occlusion and stenosis of bilateral carotid arteries: Secondary | ICD-10-CM | POA: Insufficient documentation

## 2017-02-07 DIAGNOSIS — Z95828 Presence of other vascular implants and grafts: Secondary | ICD-10-CM | POA: Diagnosis not present

## 2017-02-07 DIAGNOSIS — I6521 Occlusion and stenosis of right carotid artery: Secondary | ICD-10-CM | POA: Diagnosis not present

## 2017-02-07 DIAGNOSIS — Z9889 Other specified postprocedural states: Secondary | ICD-10-CM

## 2017-02-07 DIAGNOSIS — I10 Essential (primary) hypertension: Secondary | ICD-10-CM | POA: Diagnosis not present

## 2017-02-07 DIAGNOSIS — R0989 Other specified symptoms and signs involving the circulatory and respiratory systems: Secondary | ICD-10-CM | POA: Diagnosis present

## 2017-02-07 NOTE — Patient Instructions (Signed)
Peripheral Vascular Disease Peripheral vascular disease (PVD) is a disease of the blood vessels that are not part of your heart and brain. A simple term for PVD is poor circulation. In most cases, PVD narrows the blood vessels that carry blood from your heart to the rest of your body. This can result in a decreased supply of blood to your arms, legs, and internal organs, like your stomach or kidneys. However, it most often affects a person's lower legs and feet. There are two types of PVD.  Organic PVD. This is the more common type. It is caused by damage to the structure of blood vessels.  Functional PVD. This is caused by conditions that make blood vessels contract and tighten (spasm). Without treatment, PVD tends to get worse over time. PVD can also lead to acute ischemic limb. This is when an arm or limb suddenly has trouble getting enough blood. This is a medical emergency. Follow these instructions at home:  Take medicines only as told by your doctor.  Do not use any tobacco products, including cigarettes, chewing tobacco, or electronic cigarettes. If you need help quitting, ask your doctor.  Lose weight if you are overweight, and maintain a healthy weight as told by your doctor.  Eat a diet that is low in fat and cholesterol. If you need help, ask your doctor.  Exercise regularly. Ask your doctor for some good activities for you.  Take good care of your feet.  Wear comfortable shoes that fit well.  Check your feet often for any cuts or sores. Contact a doctor if:  You have cramps in your legs while walking.  You have leg pain when you are at rest.  You have coldness in a leg or foot.  Your skin changes.  You are unable to get or have an erection (erectile dysfunction).  You have cuts or sores on your feet that are not healing. Get help right away if:  Your arm or leg turns cold and blue.  Your arms or legs become red, warm, swollen, painful, or numb.  You have  chest pain or trouble breathing.  You suddenly have weakness in your face, arm, or leg.  You become very confused or you cannot speak.  You suddenly have a very bad headache.  You suddenly cannot see. This information is not intended to replace advice given to you by your health care provider. Make sure you discuss any questions you have with your health care provider. Document Released: 01/23/2010 Document Revised: 04/05/2016 Document Reviewed: 04/08/2014 Elsevier Interactive Patient Education  2017 Else[employer] Inc.    Stroke Prevention Some medical conditions and behaviors are associated with an increased chance of having a stroke. You may prevent a stroke by making healthy choices and managing medical conditions. How can I reduce my risk of having a stroke?  Stay physically active. Get at least 30 minutes of activity on most or all days.  Do not smoke. It may also be helpful to avoid exposure to secondhand smoke.  Limit alcohol use. Moderate alcohol use is considered to be:  No more than 2 drinks per day for men.  No more than 1 drink per day for nonpregnant women.  Eat healthy foods. This involves:  Eating 5 or more servings of fruits and vegetables a day.  Making dietary changes that address high blood pressure (hypertension), high cholesterol, diabetes, or obesity.  Manage your cholesterol levels.  Making food choices that are high in fiber and low in saturated fat,   trans fat, and cholesterol may control cholesterol levels.  Take any prescribed medicines to control cholesterol as directed by your health care provider.  Manage your diabetes.  Controlling your carbohydrate and sugar intake is recommended to manage diabetes.  Take any prescribed medicines to control diabetes as directed by your health care provider.  Control your hypertension.  Making food choices that are low in salt (sodium), saturated fat, trans fat, and cholesterol is recommended to manage  hypertension.  Ask your health care provider if you need treatment to lower your blood pressure. Take any prescribed medicines to control hypertension as directed by your health care provider.  If you are 51-50 years of age, have your blood pressure checked every 3-5 years. If you are 73 years of age or older, have your blood pressure checked every year.  Maintain a healthy weight.  Reducing calorie intake and making food choices that are low in sodium, saturated fat, trans fat, and cholesterol are recommended to manage weight.  Stop drug abuse.  Avoid taking birth control pills.  Talk to your health care provider about the risks of taking birth control pills if you are over 57 years old, smoke, get migraines, or have ever had a blood clot.  Get evaluated for sleep disorders (sleep apnea).  Talk to your health care provider about getting a sleep evaluation if you snore a lot or have excessive sleepiness.  Take medicines only as directed by your health care provider.  For some people, aspirin or blood thinners (anticoagulants) are helpful in reducing the risk of forming abnormal blood clots that can lead to stroke. If you have the irregular heart rhythm of atrial fibrillation, you should be on a blood thinner unless there is a good reason you cannot take them.  Understand all your medicine instructions.  Make sure that other conditions (such as anemia or atherosclerosis) are addressed. Get help right away if:  You have sudden weakness or numbness of the face, arm, or leg, especially on one side of the body.  Your face or eyelid droops to one side.  You have sudden confusion.  You have trouble speaking (aphasia) or understanding.  You have sudden trouble seeing in one or both eyes.  You have sudden trouble walking.  You have dizziness.  You have a loss of balance or coordination.  You have a sudden, severe headache with no known cause.  You have new chest pain or an  irregular heartbeat. Any of these symptoms may represent a serious problem that is an emergency. Do not wait to see if the symptoms will go away. Get medical help at once. Call your local emergency services (911 in [place] Do not drive yourself to the hospital. This information is not intended to replace advice given to you by your health care provider. Make sure you discuss any questions you have with your health care provider. Document Released: 12/06/2004 Document Revised: 04/05/2016 Document Reviewed: 05/01/2013 Elsevier Interactive Patient Education  2017 [employer].     Preventing Cerebrovascular Disease Arteries are blood vessels that carry blood that contains oxygen from the heart to all parts of the body. Cerebrovascular disease affects arteries that supply the brain. Any condition that blocks or disrupts blood flow to the brain can cause cerebrovascular disease. Brain cells that lose blood supply start to die within minutes (stroke). Stroke is the main danger of cerebrovascular disease. Atherosclerosis and high blood pressure are common causes of cerebrovascular disease. Atherosclerosis is narrowing and hardening of an artery  that results when fat, cholesterol, calcium, or other substances (plaque) build up inside an artery. Plaque reduces blood flow through the artery. High blood pressure increases the risk of bleeding inside the brain. Making diet and lifestyle changes to prevent atherosclerosis and high blood pressure lowers your risk of cerebrovascular disease. What nutrition changes can be made?  Eat more fruits, vegetables, and whole grains.  Reduce how much saturated fat you eat. To do this, eat less red meat and fewer full-fat dairy products.  Eat healthy proteins instead of red meat. Healthy proteins include:  Fish. Eat fish that contains heart-healthy omega-3 fatty acids, twice a week. Examples include salmon, albacore tuna, mackerel, and  herring.  Chicken.  Nuts.  Low-fat or nonfat yogurt.  Avoid processed meats, like bacon and lunchmeat.  Avoid foods that contain:  A lot of sugar, such as sweets and drinks with added sugar.  A lot of salt (sodium). Avoid adding extra salt to your food, as told by your health care provider.  Trans fats, such as margarine and baked goods. Trans fats may be listed as "partially hydrogenated oils" on food labels.  Check food labels to see how much sodium, sugar, and trans fats are in foods.  Use vegetable oils that contain low amounts of saturated fat, such as olive oil or canola oil. What lifestyle changes can be made?  Drink alcohol in moderation. This means no more than 1 drink a day for nonpregnant women and 2 drinks a day for men. One drink equals 12 oz of beer, 5 oz of wine, or 1 oz of hard liquor.  If you are overweight, ask your health care provider to recommend a weight-loss plan for you. Losing 5-10 lb (2.2-4.5 kg) can reduce your risk of diabetes, atherosclerosis, and high blood pressure.  Exercise for 30?60 minutes on most days, or as much as told by your health care provider.  Do moderate-intensity exercise, such as brisk walking, bicycling, and water aerobics. Ask your health care provider which activities are safe for you.  Do not use any products that contain nicotine or tobacco, such as cigarettes and e-cigarettes. If you need help quitting, ask your health care provider. Why are these changes important? Making these changes lowers your risk of many diseases that can cause cerebrovascular disease and stroke. Stroke is a leading cause of death and disability. Making these changes also improves your overall health and quality of life. What can I do to lower my risk? The following factors make you more likely to develop cerebrovascular disease:  Being overweight.  Smoking.  Being physically inactive.  Eating a high-fat diet.  Having certain health conditions,  such as:  Diabetes.  High blood pressure.  Heart disease.  Atherosclerosis.  High cholesterol.  Sickle cell disease. Talk with your health care provider about your risk for cerebrovascular disease. Work with your health care provider to control diseases that you have that may contribute to cerebrovascular disease. Your health care provider may prescribe medicines to help prevent major causes of cerebrovascular disease. Where to find more information: Learn more about preventing cerebrovascular disease from:  [place], [facility], and [place]: MoAnalyst.de  Centers for Disease Control and Prevention: http://www.curry-wood.biz/ Summary  Cerebrovascular disease can lead to a stroke.  Atherosclerosis and high blood pressure are major causes of cerebrovascular disease.  Making diet and lifestyle changes can reduce your risk of cerebrovascular disease.  Work with your health care provider to get your risk factors under control to reduce your  risk of cerebrovascular disease. This information is not intended to replace advice given to you by your health care provider. Make sure you discuss any questions you have with your health care provider. Document Released: 11/13/2015 Document Revised: 05/18/2016 Document Reviewed: 11/13/2015 Elsevier Interactive Patient Education  2017 [employer].

## 2017-02-07 NOTE — Progress Notes (Signed)
[facility] [place] HISTORY AND PHYSICAL   MRN : [id number]  History of Present Illness:   Thomas Ferrell is a 76 y.o. male patient of 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 He is also s/p right CEA on 10/21/06. He had a preoperative TIA; he still has balance issues since this. He denies any history of amaurosis fugax or speech difficulties. He denies any subsequent neurological events.  He still complains of burning numbness tingling in his left foot. He was being followed for this at the pain clinic. He denies claudication.  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 He walks with a quad cane or walker, states no difficulties with falling, denies non healing wounds.  Pt reports that he walks at least 30 minutes daily using a walker or quad cane, and wearing a brace on his left leg for foot drop since his back surgery.  In July 2017 he was admitted to [facility] with CAP, obstruction of common bile duct, also had a cholecystectomy.   Pt Diabetic: No Pt smoker: former smoker, quit in 2007  Pt meds include: Statin :Yes Betablocker: No ASA: yes, 325 mg daily Other anticoagulants/antiplatelets: no   Current Outpatient Prescriptions  Medication Sig Dispense Refill  . acetaminophen (TYLENOL) 500 MG tablet Take 1,000 mg by mouth every 6 (six) hours as needed for mild pain or headache.     Marland Kitchen amLODipine (NORVASC) 10 MG tablet Take 10 mg by mouth daily.    Marland Kitchen aspirin EC 325 MG tablet Take 325 mg by mouth daily.    . cholecalciferol (VITAMIN D) 1000 units tablet Take 1,000 Units by mouth daily.    . feeding supplement, ENSURE COMPLETE, (ENSURE COMPLETE) LIQD Take 237 mLs by mouth 3 (three) times daily with  meals.    . ferrous sulfate 325 (65 FE) MG EC tablet Take 325 mg by mouth daily with breakfast.    . lisinopril (PRINIVIL,ZESTRIL) 40 MG tablet Take 40 mg by mouth daily.  5  . sertraline (ZOLOFT) 100 MG tablet Take 100 mg by mouth daily.    . simvastatin (ZOCOR) 20 MG tablet Take 20 mg by mouth daily.     No current facility-administered medications for this visit.     Past Medical History:  Diagnosis Date  . Abnormality of gait 03/03/2013  . bladder ca dx'd 11/2009   chemo/xrt comp 02/2010  . Cerebrovascular disease    right brain CVA 2007  . COPD (chronic obstructive pulmonary disease) ([place])   . Cough productive of clear sputum    TX RECENT SINUS INFECTION   . CVA (cerebral vascular accident) ([place]) 2007   r-cva  . Dyslipidemia   . Hypertension   . Lumbago     Social History Social History  Substance Use Topics  . Smoking status: Former Smoker    Types: Cigarettes    Quit date: 11/12/2005  . Smokeless tobacco: Never Used  . Alcohol use No    Family History Family History  Problem Relation Age of Onset  . Stroke Mother   . Cancer Father   . Dementia Father   . Cancer Sister     Surgical History Past Surgical History:  Procedure Laterality Date  .  ABDOMINAL AORTAGRAM N/A 12/07/2013   Procedure: ABDOMINAL Maxcine Ham;  Surgeon: Angelia Mould, MD;  Location: [facility] C[facility];  Service: Cardiovascular;  Laterality: N/A;  . BACK SURGERY  2010  . bladder cancer    . CAROTID ARTERY ANGIOPLASTY  2009  . CHOLECYSTECTOMY N/A 06/06/2016   Procedure: LAPAROSCOPIC CHOLECYSTECTOMY WITH INTRAOPERATIVE CHOLANGIOGRAM;  Surgeon: Armandina Gemma, MD;  Location: [facility] ORS;  Service: General;  Laterality: N/A;  . ERCP N/A 06/05/2016   Procedure: ENDOSCOPIC RETROGRADE CHOLANGIOPANCREATOGRAPHY (ERCP);  Surgeon: Teena Irani, MD;  Location: [facility] ENDOSCOPY;  Service: Endoscopy;  Laterality: N/A;  . ESOPHAGOGASTRODUODENOSCOPY (EGD) WITH PROPOFOL N/A 06/05/2016   Procedure: ESOPHAGOGASTRODUODENOSCOPY  (EGD);  Surgeon: Teena Irani, MD;  Location: [facility] ENDOSCOPY;  Service: Endoscopy;  Laterality: N/A;  . FEMORAL-FEMORAL BYPASS GRAFT N/A 12/09/2013   Procedure: BYPASS GRAFT FEMORAL-FEMORAL ARTERY WITH BILATERAL ENDARTERECTOMY ;  Surgeon: Elam Dutch, MD;  Location[facility];  Service: Vascular;  Laterality: N/A;  . FEMORAL-POPLITEAL BYPASS GRAFT Left 12/09/2013   Procedure: BYPASS GRAFT FEMORAL-POPLITEAL ARTERY - LEFT ;  Surgeon: Elam Dutch, MD;  Location: [place];  Service: Vascular;  Laterality: Left;  . LAMINECTOMY  09/08/2013   L 2 L3 L4 L5       Dr Luiz Ochoa    Allergies  Allergen Reactions  . Morphine And Related Itching    Current Outpatient Prescriptions  Medication Sig Dispense Refill  . acetaminophen (TYLENOL) 500 MG tablet Take 1,000 mg by mouth every 6 (six) hours as needed for mild pain or headache.     Marland Kitchen amLODipine (NORVASC) 10 MG tablet Take 10 mg by mouth daily.    Marland Kitchen aspirin EC 325 MG tablet Take 325 mg by mouth daily.    . cholecalciferol (VITAMIN D) 1000 units tablet Take 1,000 Units by mouth daily.    . feeding supplement, ENSURE COMPLETE, (ENSURE COMPLETE) LIQD Take 237 mLs by mouth 3 (three) times daily with meals.    . ferrous sulfate 325 (65 FE) MG EC tablet Take 325 mg by mouth daily with breakfast.    . lisinopril (PRINIVIL,ZESTRIL) 40 MG tablet Take 40 mg by mouth daily.  5  . sertraline (ZOLOFT) 100 MG tablet Take 100 mg by mouth daily.    . simvastatin (ZOCOR) 20 MG tablet Take 20 mg by mouth daily.     No current facility-administered medications for this visit.      REVIEW OF SYSTEMS: See HPI for pertinent positives and negatives.  Physical Examination Vitals:   02/07/17 1017 02/07/17 1020  BP: 140/75 128/75  Pulse: 70   Resp: 16   Temp: 98.3 F (36.8 C)   TempSrc: Oral   SpO2: 95%   Weight: 130 lb (59 kg)   Height: 5\' 4"  (1.626 m)    Body mass index is 22.31 kg/m.  General: A&O x 3, WDWN. Gait: using cane, limp Eyes: PERRLA. Pulmonary:  Respirations are non labored, CTAB, occasional moist cough Cardiac: regular rhythm, no detected murmur. He has bilateral carotid artery bruits.    Aorta is not palpable. Radial pulses: are 2+ palpable and = Fem-fem graft pulse is readily palpable.   VASCULAR EXAM: Extremities without ischemic changes  without Gangrene; with no open wounds, no peripheral edema. Thick toenails of both great toes and left 2nd-4th toes.     LE Pulses Right Left   FEMORAL 2+ palpable faintly palpable    POPLITEAL not palpable  not palpable   POSTERIOR TIBIAL not palpable  not palpable  DORSALIS PEDIS  ANTERIOR TIBIAL 2+ palpable  1+ palpable    Abdomen: soft, NT, no palpable masses. Skin: no rashes, no ulcers, see extremities. Musculoskeletal: no muscle wasting or atrophy. Neurologic: A&O X 3; Appropriate Affect; MOTOR FUNCTION: moving all extremities equally, motor strength 5/5 throughout. Speech is fluent/normal. CN 2-12 intact.      ASSESSMENT:  Thomas Ferrell is a 76 y.o. 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 He is also s/p right CEA on 10/21/06. He had a preoperative TIA; he still has balance issues since this. He has not had any known subsequent neurological event.  He walks at least 30 minutes daily.   He does not seem to have claudication in his legs, he does have neuropathic pain in his left foot. There are no signs of ischemia in his feet/legs.   DATA (02/07/17):  Carotid duplex: Right CEA site with no restenosis, mild hyperplasia in the surgical bulb.  Left ICA with <40% stenosis. Bilateral vertebral artery flow is antegrade.  Bilateral subclavian artery waveforms  are normal.  No significant change since the last exam on 07-22-15.  Fem-fem bypass graft duplex suggests elevated velocities in the native right iliac inflow (267 cm/s). Waveforms are turbulent and suggestive of proximal disease; however, unable to evaluate due to overlying bowel. Widely patent femorofemoral bypass graft without evidence of restenosis or hyperplasia. Previous exam did not mention inflow disease; velocities were 317cm/second. This seems to have remained stable.   Left LE arterial duplex suggests a widely patent left femoropopliteal arterial bypass graft without evidence of restenosis or hyperplasia. No significant change since the last exam on 01-26-16.  ABI's: Right: 0.81 (0.89, 01-26-16), waveforms: biphasic; TBI: 0.66 Left: 0.86 (0.95, 01-25-17), waveforms: triphasic; TBI: 0.62 Mild decline in bilateral ABI; mild arterial occlusive disease bilaterally.     PLAN:  Daily seated leg exercises discussed and demonstrated.  Based on the patient's vascular studies and examination, pt will return to clinic in 6 months withABI's, fem-fem bypass graft duplex, and 1 year with left LE arterial duplex and carotid duplex.  Pt knows to notify us if he develops concerns re the circulation in his legs.   I discussed in depth with the patient the nature of atherosclerosis, and emphasized the importance of maximal medical management including strict control of blood pressure, blood glucose, and lipid levels, obtaining regular exercise, and cessation of smoking.  The patient is aware that without maximal medical management the underlying atherosclerotic disease process will progress, limiting the benefit of any interventions.  The patient was given information about stroke prevention and what symptoms should prompt the patient to seek immediate medical care.  The patient was given information about PAD including signs, symptoms, treatment, what symptoms should prompt the patient to seek  immediate medical care, and risk reduction measures to take. Thank you for allowing [place] to participate in this patient's care.  Clemon Chambers, RN, MSN, FNP-C [facility] Office: [phone]  Clinic MD: [facility] 02/07/2017 10:26 AM

## 2017-02-07 NOTE — Addendum Note (Signed)
Addended by: Lianne Cure A on: 02/07/2017 01:16 PM   Modules accepted: Orders

## 2017-02-18 DIAGNOSIS — M75101 Unspecified rotator cuff tear or rupture of right shoulder, not specified as traumatic: Secondary | ICD-10-CM | POA: Diagnosis not present

## 2017-02-27 DIAGNOSIS — M75101 Unspecified rotator cuff tear or rupture of right shoulder, not specified as traumatic: Secondary | ICD-10-CM | POA: Diagnosis not present

## 2017-03-06 DIAGNOSIS — M75101 Unspecified rotator cuff tear or rupture of right shoulder, not specified as traumatic: Secondary | ICD-10-CM | POA: Diagnosis not present

## 2017-03-12 DIAGNOSIS — M75101 Unspecified rotator cuff tear or rupture of right shoulder, not specified as traumatic: Secondary | ICD-10-CM | POA: Diagnosis not present

## 2017-03-13 DIAGNOSIS — M75101 Unspecified rotator cuff tear or rupture of right shoulder, not specified as traumatic: Secondary | ICD-10-CM | POA: Diagnosis not present

## 2017-06-11 DIAGNOSIS — M25561 Pain in right knee: Secondary | ICD-10-CM | POA: Diagnosis not present

## 2017-07-10 DIAGNOSIS — M25561 Pain in right knee: Secondary | ICD-10-CM | POA: Diagnosis not present

## 2017-07-20 DIAGNOSIS — Z23 Encounter for immunization: Secondary | ICD-10-CM | POA: Diagnosis not present

## 2017-08-15 ENCOUNTER — Encounter: Payer: Self-pay | Admitting: Family

## 2017-08-15 ENCOUNTER — Ambulatory Visit (HOSPITAL_COMMUNITY)
Admission: RE | Admit: 2017-08-15 | Discharge: 2017-08-15 | Disposition: A | Discharge status: Home / Self Care | Payer: Medicare Other | Source: Ambulatory Visit | Attending: Family | Admitting: Family

## 2017-08-15 ENCOUNTER — Ambulatory Visit (INDEPENDENT_AMBULATORY_CARE_PROVIDER_SITE_OTHER)
Admission: RE | Admit: 2017-08-15 | Discharge: 2017-08-15 | Disposition: A | Discharge status: Home / Self Care | Payer: Medicare Other | Source: Ambulatory Visit | Attending: Family | Admitting: Family

## 2017-08-15 ENCOUNTER — Ambulatory Visit (INDEPENDENT_AMBULATORY_CARE_PROVIDER_SITE_OTHER): Payer: Medicare Other | Admitting: Family

## 2017-08-15 VITALS — BP 139/74 | HR 75 | Resp 16 | Ht 64.0 in | Wt 122.0 lb

## 2017-08-15 DIAGNOSIS — Z9889 Other specified postprocedural states: Secondary | ICD-10-CM | POA: Diagnosis not present

## 2017-08-15 DIAGNOSIS — I6521 Occlusion and stenosis of right carotid artery: Secondary | ICD-10-CM | POA: Diagnosis not present

## 2017-08-15 DIAGNOSIS — I779 Disorder of arteries and arterioles, unspecified: Secondary | ICD-10-CM

## 2017-08-15 DIAGNOSIS — Z95828 Presence of other vascular implants and grafts: Secondary | ICD-10-CM

## 2017-08-15 DIAGNOSIS — Z87891 Personal history of nicotine dependence: Secondary | ICD-10-CM

## 2017-08-15 NOTE — Progress Notes (Signed)
[facility] [place] HISTORY AND PHYSICAL   MRN : [id number]  History of Present Illness:   Thomas Ferrell is a 76 y.o. male patient of 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 He is also s/p right CEA on 10/21/06. He had a preoperative TIA; he still has balance issues since this. He denies any history of amaurosis fugax or speech difficulties. He denies any subsequent neurological events.  He still complains of burning numbness tingling in his left foot. He was being followed for this at the pain clinic. He denies claudication.  He also reports that his left great toe has toe drop, he has a lower leg brace that helps this. 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 He walks with a quad cane or walker, states no difficulties with falling, denies non healing wounds.  He has increased his exercising, is walking 1.5 to 2 miles daily as recorded on his phone app.  Pt reports that he walks at least 30 minutes daily using a walker or quad cane, and wearing a brace on his left leg for foot drop since his back surgery.  In July 2017 he was admitted to [facility] with CAP, obstruction of common bile duct, also had a cholecystectomy.   Pt Diabetic: No Pt smoker: former smoker, quit in 2007  Pt meds include: Statin :Yes Betablocker: No ASA: yes, 325 mg daily Other anticoagulants/antiplatelets: no    Current Outpatient Prescriptions  Medication Sig Dispense Refill  . acetaminophen (TYLENOL) 500 MG tablet Take 1,000 mg by mouth every 6 (six) hours as needed for mild pain or headache.     Marland Kitchen amLODipine (NORVASC) 10 MG tablet Take 10 mg by mouth daily.    Marland Kitchen aspirin EC 325 MG tablet Take 325 mg by mouth daily.    . cholecalciferol (VITAMIN D) 1000 units tablet Take 1,000 Units by mouth daily.    . feeding  supplement, ENSURE COMPLETE, (ENSURE COMPLETE) LIQD Take 237 mLs by mouth 3 (three) times daily with meals.    . ferrous sulfate 325 (65 FE) MG EC tablet Take 325 mg by mouth daily with breakfast.    . lisinopril (PRINIVIL,ZESTRIL) 40 MG tablet Take 40 mg by mouth daily.  5  . sertraline (ZOLOFT) 100 MG tablet Take 100 mg by mouth daily.    . simvastatin (ZOCOR) 20 MG tablet Take 20 mg by mouth daily.     No current facility-administered medications for this visit.     Past Medical History:  Diagnosis Date  . Abnormality of gait 03/03/2013  . bladder ca dx'd 11/2009   chemo/xrt comp 02/2010  . Cerebrovascular disease    right brain CVA 2007  . COPD (chronic obstructive pulmonary disease) ([place])   . Cough productive of clear sputum    TX RECENT SINUS INFECTION   . CVA (cerebral vascular accident) ([place]) 2007   r-cva  . Dyslipidemia   . Hypertension   . Lumbago     Social History Social History  Substance Use Topics  . Smoking status: Former Smoker    Types: Cigarettes    Quit date: 11/12/2005  . Smokeless tobacco: Never Used  . Alcohol use No    Family History Family History  Problem Relation Age of Onset  . Stroke Mother   . Cancer Father   .  Dementia Father   . Cancer Sister     Surgical History Past Surgical History:  Procedure Laterality Date  . ABDOMINAL AORTAGRAM N/A 12/07/2013   Procedure: ABDOMINAL Maxcine Ham;  Surgeon: Angelia Mould, MD;  Location: [facility] CATH LAB;  Service: Cardiovascular;  Laterality: N/A;  . BACK SURGERY  2010  . bladder cancer    . CAROTID ARTERY ANGIOPLASTY  2009  . CHOLECYSTECTOMY N/A 06/06/2016   Procedure: LAPAROSCOPIC CHOLECYSTECTOMY WITH INTRAOPERATIVE CHOLANGIOGRAM;  Surgeon: Armandina Gemma, MD;  Location: [facility] ORS;  Service: General;  Laterality: N/A;  . ERCP N/A 06/05/2016   Procedure: ENDOSCOPIC RETROGRADE CHOLANGIOPANCREATOGRAPHY (ERCP);  Surgeon: Teena Irani, MD;  Location: [facility] ENDOSCOPY;  Service: Endoscopy;  Laterality: N/A;  .  ESOPHAGOGASTRODUODENOSCOPY (EGD) WITH PROPOFOL N/A 06/05/2016   Procedure: ESOPHAGOGASTRODUODENOSCOPY (EGD);  Surgeon: Teena Irani, MD;  Location: [facility] ENDOSCOPY;  Service: Endoscopy;  Laterality: N/A;  . FEMORAL-FEMORAL BYPASS GRAFT N/A 12/09/2013   Procedure: BYPASS GRAFT FEMORAL-FEMORAL ARTERY WITH BILATERAL ENDARTERECTOMY ;  Surgeon: Elam Dutch, MD;  Location: [facility] OR;  Service: Vascular;  Laterality: N/A;  . FEMORAL-POPLITEAL BYPASS GRAFT Left 12/09/2013   Procedure: BYPASS GRAFT FEMORAL-POPLITEAL ARTERY - LEFT ;  Surgeon: Elam Dutch, MD;  Location: [facility];  Service: Vascular;  Laterality: Left;  . LAMINECTOMY  09/08/2013   L 2 L3 L4 L5       Dr Luiz Ochoa    Allergies  Allergen Reactions  . Morphine And Related Itching    Current Outpatient Prescriptions  Medication Sig Dispense Refill  . acetaminophen (TYLENOL) 500 MG tablet Take 1,000 mg by mouth every 6 (six) hours as needed for mild pain or headache.     Marland Kitchen amLODipine (NORVASC) 10 MG tablet Take 10 mg by mouth daily.    Marland Kitchen aspirin EC 325 MG tablet Take 325 mg by mouth daily.    . cholecalciferol (VITAMIN D) 1000 units tablet Take 1,000 Units by mouth daily.    . feeding supplement, ENSURE COMPLETE, (ENSURE COMPLETE) LIQD Take 237 mLs by mouth 3 (three) times daily with meals.    . ferrous sulfate 325 (65 FE) MG EC tablet Take 325 mg by mouth daily with breakfast.    . lisinopril (PRINIVIL,ZESTRIL) 40 MG tablet Take 40 mg by mouth daily.  5  . sertraline (ZOLOFT) 100 MG tablet Take 100 mg by mouth daily.    . simvastatin (ZOCOR) 20 MG tablet Take 20 mg by mouth daily.     No current facility-administered medications for this visit.      REVIEW OF SYSTEMS: See HPI for pertinent positives and negatives.  Physical Examination Vitals:   08/15/17 1315  BP: 139/74  Pulse: 75  Resp: 16  SpO2: 95%  Weight: 122 lb (55.3 kg)  Height: 5\' 4"  (1.626 m)   Body mass index is 20.94 kg/m.  General:  A&O x 3, WDWN. Gait: using  cane, limp, brace on left lower leg Eyes: PERRLA. Pulmonary: Respirations are non labored, CTAB, occasional moist cough Cardiac: regular rhythm, no detected murmur. He has bilateral carotid artery bruits.    Abdominal aortic pulse is not palpable. Radial pulses: are 2+ palpable and = Fem-fem graft pulse is readily palpable.   VASCULAR EXAM: Extremitieswithout ischemic changes  without Gangrene; with no open wounds, no peripheral edema. Thick toenails of both great toes and left 2nd-4th toes.     LE Pulses Right Left   FEMORAL 2+ palpable faintly palpable    POPLITEAL not palpable  not palpable  POSTERIOR TIBIAL not palpable  not palpable    DORSALIS PEDIS  ANTERIOR TIBIAL 2+ palpable  1+ palpable    Abdomen: soft, NT, no palpable masses. Skin: no rashes, no ulcers, see extremities. Musculoskeletal: no muscle wasting or atrophy. Neurologic: A&O X 3; Appropriate Affect; MOTOR FUNCTION: moving all extremities equally, motor strength 5/5 throughout. Speech is fluent/normal. CN 2-12 intact.    ASSESSMENT:  Thomas Ferrell is a 76 y.o. 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 He is also s/p right CEA on 10/21/06. He had a preoperative TIA; he still has balance issues since this. He has not had any known subsequent neurological event.  He walks at least 30 minutes daily.  He does not seem to have claudication in his legs, he does have neuropathic pain in his left foot that he states is related to his lumbar spine issues, has has had 2 lumbar spine surgeries. There are no signs of ischemia in his feet/legs.   DATA   Fem-fem bypass graft duplex (08/15/17): Elevated  velocities in the native right iliac inflow (310 cm/s). Previous  velocities were 317cm/second.This seems to have remained stable.  Widely patent femorofemoral bypass graft without evidence of restenosis or hyperplasia.   Carotid duplex (02/07/17):  Right CEA site with no restenosis, mild hyperplasia in the surgical bulb.  Left ICA with <40% stenosis. Bilateral vertebral artery flow is antegrade.  Bilateral subclavian artery waveforms are normal.  No significant change since the last exam on 07-22-15.   Left LE arterial duplex (02/07/17) suggests a widely patent left femoropopliteal arterial bypass graft without evidence of restenosis or hyperplasia. No significant change since the last exam on 01-26-16.   ABI (Date: 08/15/2017):  R:   ABI: 0.70 (was 0.81 on 02-07-17),   PT: bi  DP: bi  TBI:  0.80 (was 0.66)  L:   ABI: 0.88 (was 0.86),   PT: bi  DP: bi  TBI: 0.87 (was 0.62) Mild decline in right ABI, stable in left. Improved bilateral TBI. All biphasic waveforms.     PLAN:  Daily seated leg exercises discussed and demonstrated.  Based on the patient's vascular studies and examination, pt will return to clinic in 1 year with ABI's, fem-fem bypass graft duplex, left LE arterial duplex, and carotid duplex.  Pt knows to notify us if he develops concerns re the circulation in his legs.   I discussed in depth with the patient the nature of atherosclerosis, and emphasized the importance of maximal medical management including strict control of blood pressure, blood glucose, and lipid levels, obtaining regular exercise, and cessation of smoking.  The patient is aware that without maximal medical management the underlying atherosclerotic disease process will progress, limiting the benefit of any interventions.  The patient was given information about stroke prevention and what symptoms should prompt the patient to seek immediate medical care.  The patient was given  information about PAD including signs, symptoms, treatment, what symptoms should prompt the patient to seek immediate medical care, and risk reduction measures to take.  Thank you for allowing [place] to participate in this patient's care.  Clemon Chambers, RN, MSN, FNP-C [facility] Office: [phone]  Clinic MD: Oneida Alar 08/15/2017 1:31 PM

## 2017-08-15 NOTE — Patient Instructions (Signed)
Stroke Prevention Some medical conditions and behaviors are associated with an increased chance of having a stroke. You may prevent a stroke by making healthy choices and managing medical conditions. How can I reduce my risk of having a stroke?  Stay physically active. Get at least 30 minutes of activity on most or all days.  Do not smoke. It may also be helpful to avoid exposure to secondhand smoke.  Limit alcohol use. Moderate alcohol use is considered to be:  No more than 2 drinks per day for men.  No more than 1 drink per day for nonpregnant women.  Eat healthy foods. This involves:  Eating 5 or more servings of fruits and vegetables a day.  Making dietary changes that address high blood pressure (hypertension), high cholesterol, diabetes, or obesity.  Manage your cholesterol levels.  Making food choices that are high in fiber and low in saturated fat, trans fat, and cholesterol may control cholesterol levels.  Take any prescribed medicines to control cholesterol as directed by your health care provider.  Manage your diabetes.  Controlling your carbohydrate and sugar intake is recommended to manage diabetes.  Take any prescribed medicines to control diabetes as directed by your health care provider.  Control your hypertension.  Making food choices that are low in salt (sodium), saturated fat, trans fat, and cholesterol is recommended to manage hypertension.  Ask your health care provider if you need treatment to lower your blood pressure. Take any prescribed medicines to control hypertension as directed by your health care provider.  If you are 18-39 years of age, have your blood pressure checked every 3-5 years. If you are 40 years of age or older, have your blood pressure checked every year.  Maintain a healthy weight.  Reducing calorie intake and making food choices that are low in sodium, saturated fat, trans fat, and cholesterol are recommended to manage  weight.  Stop drug abuse.  Avoid taking birth control pills.  Talk to your health care provider about the risks of taking birth control pills if you are over 35 years old, smoke, get migraines, or have ever had a blood clot.  Get evaluated for sleep disorders (sleep apnea).  Talk to your health care provider about getting a sleep evaluation if you snore a lot or have excessive sleepiness.  Take medicines only as directed by your health care provider.  For some people, aspirin or blood thinners (anticoagulants) are helpful in reducing the risk of forming abnormal blood clots that can lead to stroke. If you have the irregular heart rhythm of atrial fibrillation, you should be on a blood thinner unless there is a good reason you cannot take them.  Understand all your medicine instructions.  Make sure that other conditions (such as anemia or atherosclerosis) are addressed. Get help right away if:  You have sudden weakness or numbness of the face, arm, or leg, especially on one side of the body.  Your face or eyelid droops to one side.  You have sudden confusion.  You have trouble speaking (aphasia) or understanding.  You have sudden trouble seeing in one or both eyes.  You have sudden trouble walking.  You have dizziness.  You have a loss of balance or coordination.  You have a sudden, severe headache with no known cause.  You have new chest pain or an irregular heartbeat. Any of these symptoms may represent a serious problem that is an emergency. Do not wait to see if the symptoms will go away.   Get medical help at once. Call your local emergency services (911 in [place] Do not drive yourself to the hospital.  This information is not intended to replace advice given to you by your health care provider. Make sure you discuss any questions you have with your health care provider. Document Released: 12/06/2004 Document Revised: 04/05/2016 Document Reviewed: 05/01/2013 Elsevier  Interactive Patient Education  2017 Elsevier Inc.      Peripheral Vascular Disease Peripheral vascular disease (PVD) is a disease of the blood vessels that are not part of your heart and brain. A simple term for PVD is poor circulation. In most cases, PVD narrows the blood vessels that carry blood from your heart to the rest of your body. This can result in a decreased supply of blood to your arms, legs, and internal organs, like your stomach or kidneys. However, it most often affects a person's lower legs and feet. There are two types of PVD.  Organic PVD. This is the more common type. It is caused by damage to the structure of blood vessels.  Functional PVD. This is caused by conditions that make blood vessels contract and tighten (spasm). Without treatment, PVD tends to get worse over time. PVD can also lead to acute ischemic limb. This is when an arm or limb suddenly has trouble getting enough blood. This is a medical emergency. Follow these instructions at home:  Take medicines only as told by your doctor.  Do not use any tobacco products, including cigarettes, chewing tobacco, or electronic cigarettes. If you need help quitting, ask your doctor.  Lose weight if you are overweight, and maintain a healthy weight as told by your doctor.  Eat a diet that is low in fat and cholesterol. If you need help, ask your doctor.  Exercise regularly. Ask your doctor for some good activities for you.  Take good care of your feet.  Wear comfortable shoes that fit well.  Check your feet often for any cuts or sores. Contact a doctor if:  You have cramps in your legs while walking.  You have leg pain when you are at rest.  You have coldness in a leg or foot.  Your skin changes.  You are unable to get or have an erection (erectile dysfunction).  You have cuts or sores on your feet that are not healing. Get help right away if:  Your arm or leg turns cold and blue.  Your arms or legs  become red, warm, swollen, painful, or numb.  You have chest pain or trouble breathing.  You suddenly have weakness in your face, arm, or leg.  You become very confused or you cannot speak.  You suddenly have a very bad headache.  You suddenly cannot see. This information is not intended to replace advice given to you by your health care provider. Make sure you discuss any questions you have with your health care provider. Document Released: 01/23/2010 Document Revised: 04/05/2016 Document Reviewed: 04/08/2014 Elsevier Interactive Patient Education  2017 Elsevier Inc.  

## 2017-10-09 NOTE — Addendum Note (Signed)
Addended by: Lianne Cure A on: 10/09/2017 01:08 PM   Modules accepted: Orders

## 2017-10-13 IMAGING — MR MR ABDOMEN WO/W CM MRCP
11 of 21 series · 19 of 48 positions shown · IV contrast (multihance)
Comparison: CT abdomen pelvis 06/03/2016; 09/16/2015

CLINICAL DATA: Patient with new common bile duct dilatation.
Evaluate for choledocholithiasis.

EXAM:
MRI ABDOMEN WITHOUT AND WITH CONTRAST (INCLUDING MRCP)
TECHNIQUE: Multiplanar multisequence MR imaging of the abdomen was performed
both before and after the administration of intravenous contrast.
Heavily T2-weighted images of the biliary and pancreatic ducts were
obtained, and three-dimensional MRCP images were rendered by post
processing.
CONTRAST:  12mL MULTIHANCE GADOBENATE DIMEGLUMINE 529 MG/ML IV SOLN

[Series 4: T2 fat-sat · axial · 5.0mm · 0.78mm/px · 1 of 44 slices shown]
[im 1/44]
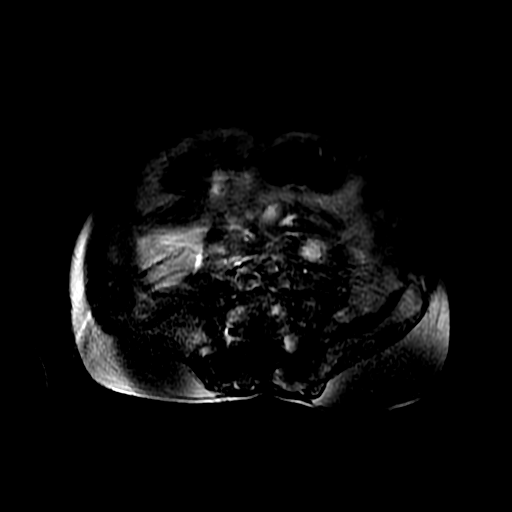

[Series 6: MRCP · coronal · 2.0mm · 0.70mm/px · 1 of 54 slices shown (1 of 2)]
[im 1/54]
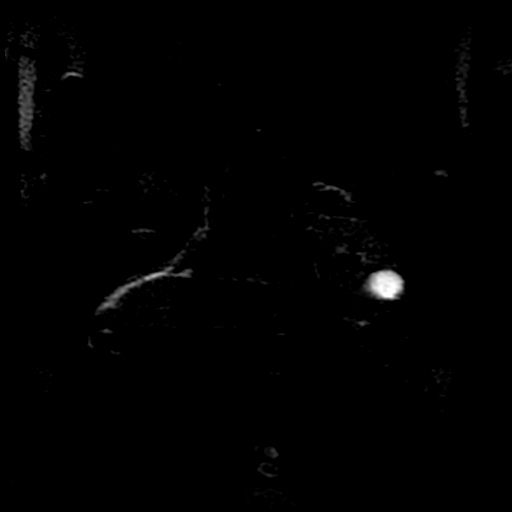

[Series 7: DWI b500 · axial · 6.0mm · 1.48mm/px · 1 of 58 slices shown]
[im 1/58]
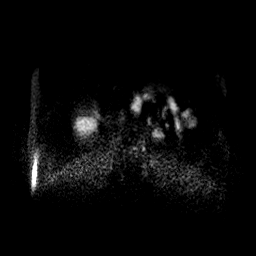

[Series 11: ax dualecho · axial · 5.0mm · 0.78mm/px · z∈[-45,+165]mm · 3 of 86 slices shown]
[im 1/86]
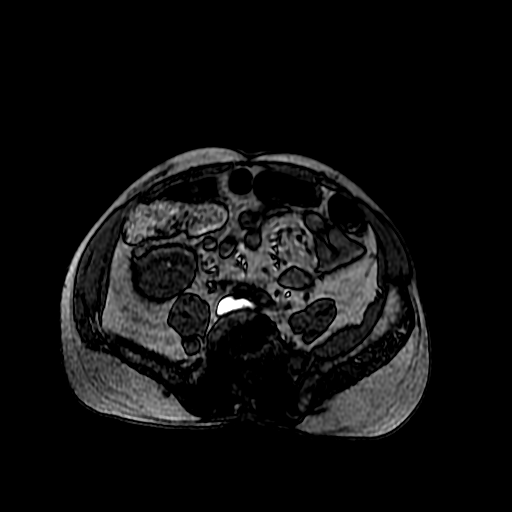
[im 43/86]
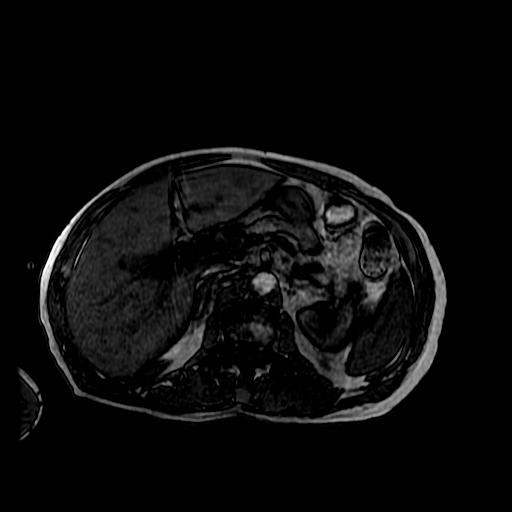
[im 86/86]
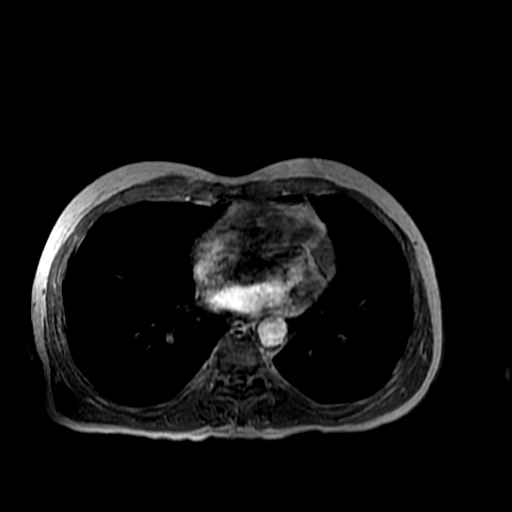

[Series 12: MRCP · sagittal · 40.0mm · 0.70mm/px · 1 of 6 slices shown (2 of 2)]
[im 1/6]
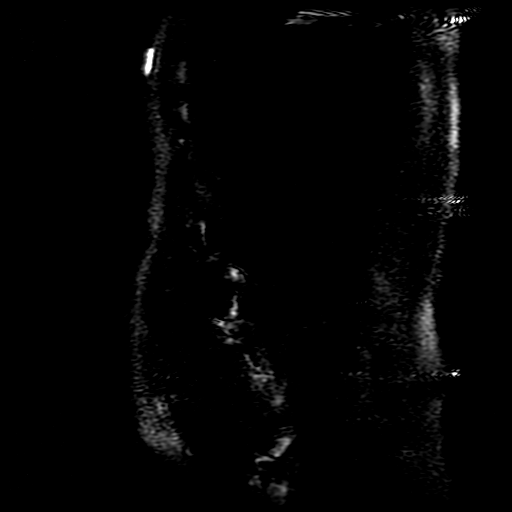

[Series 13: bSSFP fat-sat · coronal · 5.0mm · 0.70mm/px · 1 of 47 slices shown]
[im 1/47]
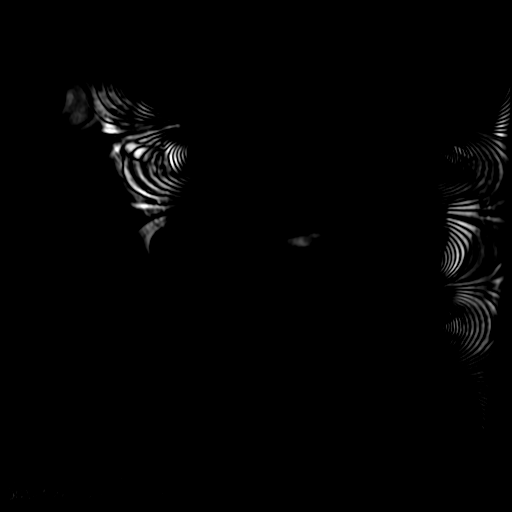

[Series 14: T2 · axial · 5.0mm · 0.78mm/px · 1 of 43 slices shown]
[im 1/43]
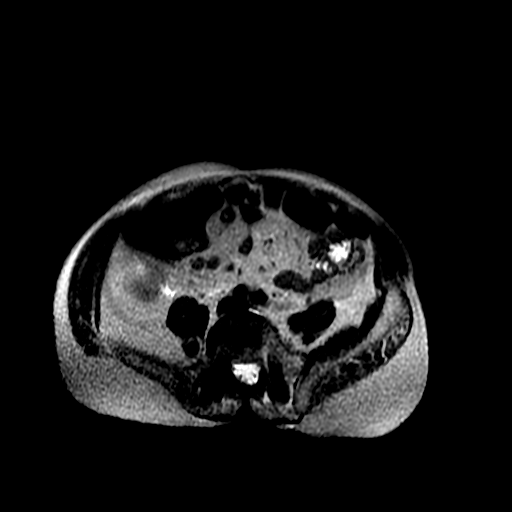

[Series 16: T1 dynamic · coronal · delayed · 4.0mm · 0.78mm/px · 3 of 112 slices shown (1 of 3)]
[im 1/112]
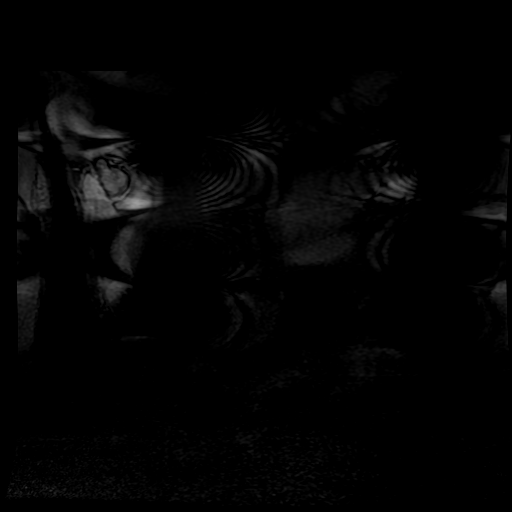
[im 56/112]
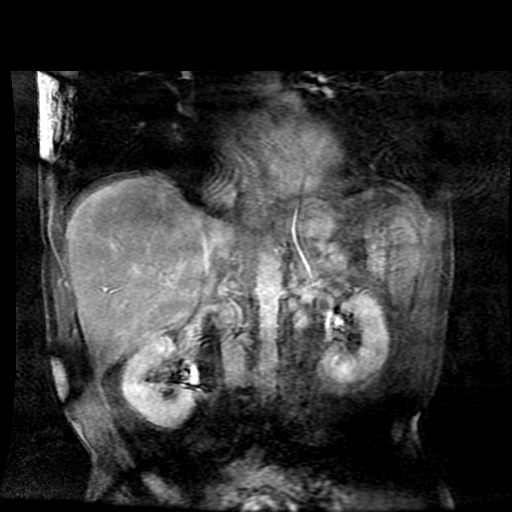
[im 112/112]
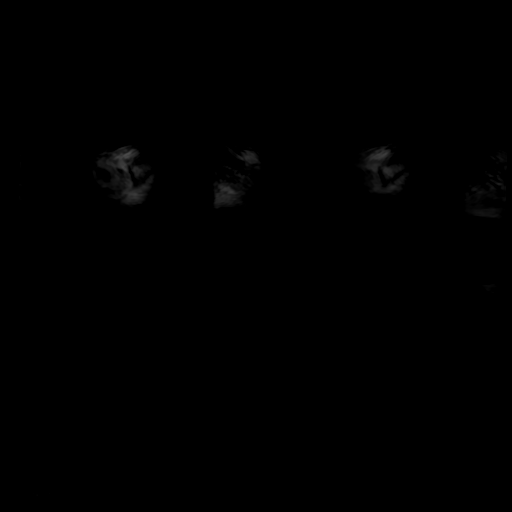

[Series 700: DWI · axial · 6.0mm · 1.48mm/px · 1 of 29 slices shown]
[im 1/29]
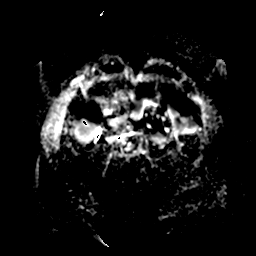

[Series 1500: T1 dynamic · axial · 5.0mm · 0.78mm/px · z∈[-49,+168]mm · 3 of 88 slices shown (2 of 3)]
[im 1/88]
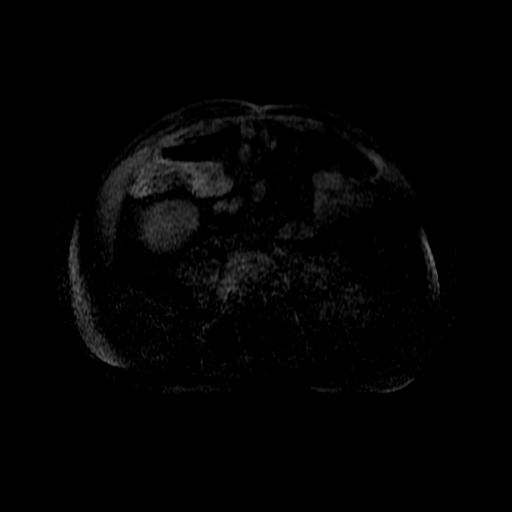
[im 44/88]
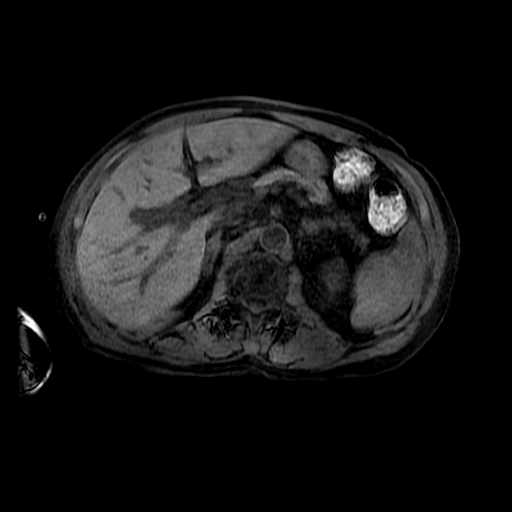
[im 88/88]
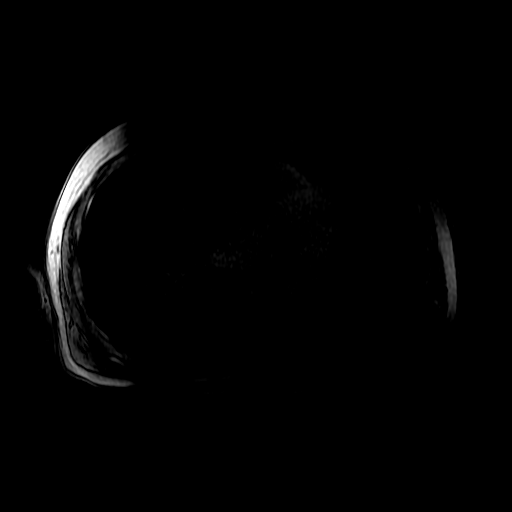

[Series 1501: T1 dynamic · axial · 5.0mm · 0.78mm/px · z∈[-49,+168]mm · 3 of 88 slices shown (3 of 3)]
[im 1/88]
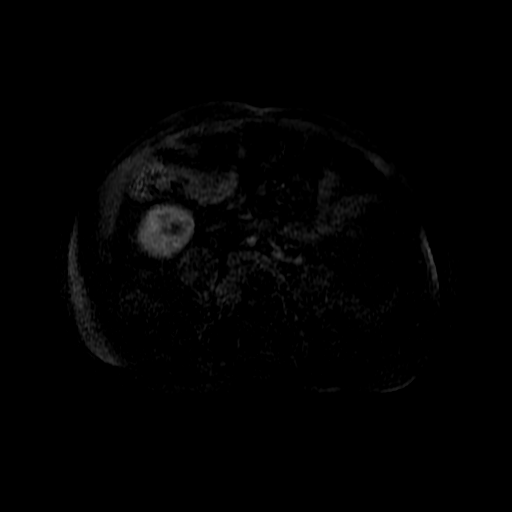
[im 44/88]
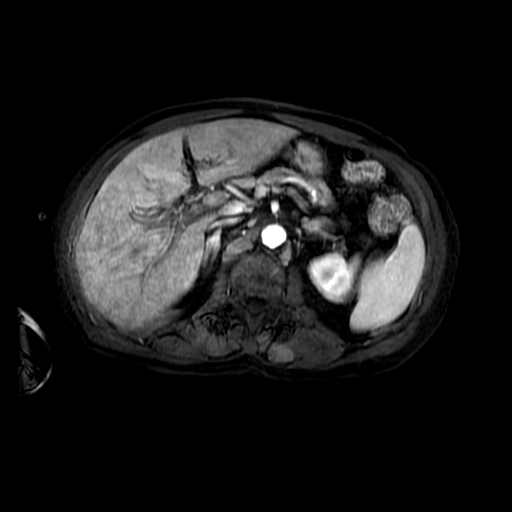
[im 88/88]
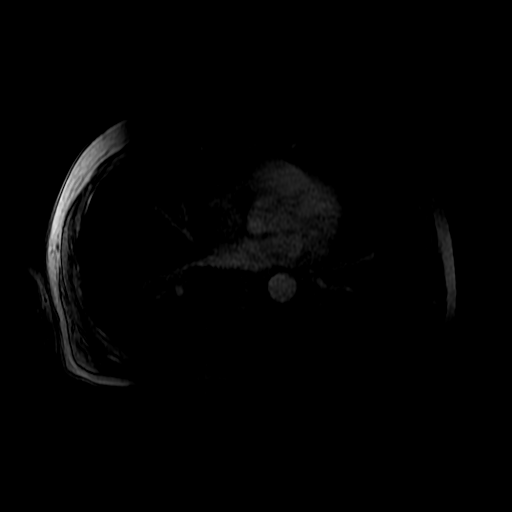

[19 of 48 positions shown; findings below may reference images not displayed]

FINDINGS: Lower chest: Patchy consolidation within the lingula and right lower
lobe. No pleural effusion. Normal heart size.

Hepatobiliary: The liver is normal in size and contour. No focal
hepatic lesion is identified. There is circumferential wall
thickening of the gallbladder with mild surrounding edema. New
intrahepatic and extrahepatic biliary ductal dilatation. Common bile
duct measures up to 9 mm. There are a few tiny filling defects
demonstrated within the common bile duct within the mid aspect and
distal aspect (image 28; series 6) concerning for
choledocholithiasis.

Pancreas: Unremarkable.

Spleen: Unremarkable

Adrenals/Urinary Tract: Unchanged thickening of the bilateral
adrenal glands. Kidneys enhance symmetrically with contrast. There
is a 2.6 cm cyst within the interpolar region of the left kidney.

Stomach/Bowel: No abnormal bowel wall thickening or evidence for
bowel obstruction.

Vascular/Lymphatic: Normal caliber abdominal aorta. Multiple
enlarged porta hepatic and upper abdominal lymph nodes including a
1.4 cm porta hepatic lymph node (image 22; series 4).

Other: None.

Musculoskeletal: No aggressive or acute appearing osseous lesions.
Lumbar spinal fusion hardware.
IMPRESSION: Interval development of intrahepatic and extrahepatic biliary ductal
dilatation. Multiple small filling defects are demonstrated within
the common bile duct suggestive of choledocholithiasis. Additionally
there is cholelithiasis as well as gallbladder wall thickening and
small amount of associated pericholecystic edema concerning for
acute cholecystitis.

Nonspecific porta hepatic and upper abdominal adenopathy,
potentially reactive in etiology. Recommend clinical and laboratory
correlation.

Patchy consolidation within the lingula and right lower lobe may
represent infection or atelectasis. Continued radiographic followup
to ensure resolution is recommended.

## 2017-11-29 DIAGNOSIS — I739 Peripheral vascular disease, unspecified: Secondary | ICD-10-CM | POA: Diagnosis not present

## 2017-11-29 DIAGNOSIS — Z1211 Encounter for screening for malignant neoplasm of colon: Secondary | ICD-10-CM | POA: Diagnosis not present

## 2017-11-29 DIAGNOSIS — Z79899 Other long term (current) drug therapy: Secondary | ICD-10-CM | POA: Diagnosis not present

## 2017-11-29 DIAGNOSIS — F322 Major depressive disorder, single episode, severe without psychotic features: Secondary | ICD-10-CM | POA: Diagnosis not present

## 2017-11-29 DIAGNOSIS — E785 Hyperlipidemia, unspecified: Secondary | ICD-10-CM | POA: Diagnosis not present

## 2017-11-29 DIAGNOSIS — I10 Essential (primary) hypertension: Secondary | ICD-10-CM | POA: Diagnosis not present

## 2017-11-29 DIAGNOSIS — J449 Chronic obstructive pulmonary disease, unspecified: Secondary | ICD-10-CM | POA: Diagnosis not present

## 2017-11-29 DIAGNOSIS — Z0000 Encounter for general adult medical examination without abnormal findings: Secondary | ICD-10-CM | POA: Diagnosis not present

## 2017-11-29 DIAGNOSIS — F419 Anxiety disorder, unspecified: Secondary | ICD-10-CM | POA: Diagnosis not present

## 2018-04-01 DIAGNOSIS — E785 Hyperlipidemia, unspecified: Secondary | ICD-10-CM | POA: Diagnosis not present

## 2018-04-01 DIAGNOSIS — Z79899 Other long term (current) drug therapy: Secondary | ICD-10-CM | POA: Diagnosis not present

## 2018-05-29 DIAGNOSIS — E785 Hyperlipidemia, unspecified: Secondary | ICD-10-CM | POA: Diagnosis not present

## 2018-05-29 DIAGNOSIS — I10 Essential (primary) hypertension: Secondary | ICD-10-CM | POA: Diagnosis not present

## 2018-05-29 DIAGNOSIS — K9189 Other postprocedural complications and disorders of digestive system: Secondary | ICD-10-CM | POA: Diagnosis not present

## 2018-08-01 DIAGNOSIS — Z23 Encounter for immunization: Secondary | ICD-10-CM | POA: Diagnosis not present

## 2018-08-29 ENCOUNTER — Ambulatory Visit (HOSPITAL_COMMUNITY)
Admission: RE | Admit: 2018-08-29 | Discharge: 2018-08-29 | Disposition: A | Discharge status: Home / Self Care | Payer: Medicare Other | Source: Ambulatory Visit | Attending: Family | Admitting: Family

## 2018-08-29 ENCOUNTER — Ambulatory Visit (INDEPENDENT_AMBULATORY_CARE_PROVIDER_SITE_OTHER)
Admission: RE | Admit: 2018-08-29 | Discharge: 2018-08-29 | Disposition: A | Discharge status: Home / Self Care | Payer: Medicare Other | Source: Ambulatory Visit | Attending: Family | Admitting: Family

## 2018-08-29 DIAGNOSIS — I779 Disorder of arteries and arterioles, unspecified: Secondary | ICD-10-CM

## 2018-08-29 DIAGNOSIS — Z87891 Personal history of nicotine dependence: Secondary | ICD-10-CM

## 2018-08-29 DIAGNOSIS — Z95828 Presence of other vascular implants and grafts: Secondary | ICD-10-CM

## 2018-09-02 ENCOUNTER — Ambulatory Visit (HOSPITAL_COMMUNITY)
Admission: RE | Admit: 2018-09-02 | Discharge: 2018-09-02 | Disposition: A | Discharge status: Home / Self Care | Payer: Medicare Other | Source: Ambulatory Visit | Attending: Vascular Surgery | Admitting: Vascular Surgery

## 2018-09-02 ENCOUNTER — Encounter: Payer: Self-pay | Admitting: Family

## 2018-09-02 ENCOUNTER — Ambulatory Visit (INDEPENDENT_AMBULATORY_CARE_PROVIDER_SITE_OTHER): Payer: Medicare Other | Admitting: Family

## 2018-09-02 VITALS — BP 152/78 | HR 78 | Temp 97.1°F | Resp 18 | Ht 64.0 in | Wt 127.0 lb

## 2018-09-02 DIAGNOSIS — I6521 Occlusion and stenosis of right carotid artery: Secondary | ICD-10-CM | POA: Diagnosis not present

## 2018-09-02 DIAGNOSIS — Z9889 Other specified postprocedural states: Secondary | ICD-10-CM

## 2018-09-02 DIAGNOSIS — Z95828 Presence of other vascular implants and grafts: Secondary | ICD-10-CM

## 2018-09-02 DIAGNOSIS — Z87891 Personal history of nicotine dependence: Secondary | ICD-10-CM

## 2018-09-02 DIAGNOSIS — I779 Disorder of arteries and arterioles, unspecified: Secondary | ICD-10-CM

## 2018-09-02 NOTE — Progress Notes (Signed)
[facility] [place] HISTORY AND PHYSICAL   CC: Follow up extracranial carotid artery stenosis and peripheral artery occlusive disease    History of Present Illness:   Thomas Ferrell is a 77 y.o. 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. He is also s/p right CEA on 10/21/06. He had a preoperative TIA; he still has balance issues since this. He denies any history of amaurosis fugax or speech difficulties. He denies any subsequent neurological events.  He still complains of burning numbness tingling in his left foot. He wasbeing followed for this at the pain clinic. He denies claudication type symptoms with walking.  He also reports that his left great toe has toe drop, he has a lower leg brace that helps this. 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 He walks with a quad cane or walker, states no difficulties with falling, denies non healing wounds.  Pt reports that he walks at least 30 minutes daily using a walker or quad cane, and wearing a brace on his left leg for foot drop since his back surgery.  In July 2017 he was admitted to [facility] with CAP, obstruction of common bile duct, also had a cholecystectomy.   Diabetic: No Tobacco use: former smoker, quit in 2007  Pt meds include: Statin :Yes Betablocker: No ASA: yes, 325 mg daily Other anticoagulants/antiplatelets: no     Current Outpatient Medications  Medication Sig Dispense Refill  . acetaminophen (TYLENOL) 500 MG tablet Take 1,000 mg by mouth every 6 (six) hours as needed for mild pain or headache.     Marland Kitchen amLODipine (NORVASC) 10 MG tablet Take 10 mg by mouth daily.    Marland Kitchen aspirin EC 325 MG tablet Take 325 mg by mouth daily.    . cholecalciferol (VITAMIN D) 1000 units tablet Take 1,000 Units by mouth daily.    . feeding  supplement, ENSURE COMPLETE, (ENSURE COMPLETE) LIQD Take 237 mLs by mouth 3 (three) times daily with meals.    . ferrous sulfate 325 (65 FE) MG EC tablet Take 325 mg by mouth daily with breakfast.    . lisinopril (PRINIVIL,ZESTRIL) 40 MG tablet Take 40 mg by mouth daily.  5  . sertraline (ZOLOFT) 100 MG tablet Take 100 mg by mouth daily.    . simvastatin (ZOCOR) 20 MG tablet Take 20 mg by mouth daily.     No current facility-administered medications for this visit.     Past Medical History:  Diagnosis Date  . Abnormality of gait 03/03/2013  . bladder ca dx'd 11/2009   chemo/xrt comp 02/2010  . Cerebrovascular disease    right brain CVA 2007  . COPD (chronic obstructive pulmonary disease) ([place])   . Cough productive of clear sputum    TX RECENT SINUS INFECTION   . CVA (cerebral vascular accident) ([place]) 2007   r-cva  . Dyslipidemia   . Hypertension   . Lumbago     Social History Social History   Tobacco Use  . Smoking status: Former Smoker    Types: Cigarettes    Last attempt to quit: 11/12/2005    Years since quitting: 12.8  . Smokeless tobacco: Never Used  Substance Use Topics  . Alcohol use: No  . Drug use: No    Family History Family History  Problem Relation Age of  Onset  . Stroke Mother   . Cancer Father   . Dementia Father   . Cancer Sister     Surgical History Past Surgical History:  Procedure Laterality Date  . ABDOMINAL AORTAGRAM N/A 12/07/2013   Procedure: ABDOMINAL Maxcine Ham;  Surgeon: Angelia Mould, MD;  Location: [facility] CATH LAB;  Service: Cardiovascular;  Laterality: N/A;  . BACK SURGERY  2010  . bladder cancer    . CAROTID ARTERY ANGIOPLASTY  2009  . CHOLECYSTECTOMY N/A 06/06/2016   Procedure: LAPAROSCOPIC CHOLECYSTECTOMY WITH INTRAOPERATIVE CHOLANGIOGRAM;  Surgeon: Armandina Gemma, MD;  Location: [facility] ORS;  Service: General;  Laterality: N/A;  . ERCP N/A 06/05/2016   Procedure: ENDOSCOPIC RETROGRADE CHOLANGIOPANCREATOGRAPHY (ERCP);  Surgeon: Teena Irani,  MD;  Location: [facility] ENDOSCOPY;  Service: Endoscopy;  Laterality: N/A;  . ESOPHAGOGASTRODUODENOSCOPY (EGD) WITH PROPOFOL N/A 06/05/2016   Procedure: ESOPHAGOGASTRODUODENOSCOPY (EGD);  Surgeon: Teena Irani, MD;  Location: [facility] ENDOSCOPY;  Service: Endoscopy;  Laterality: N/A;  . FEMORAL-FEMORAL BYPASS GRAFT N/A 12/09/2013   Procedure: BYPASS GRAFT FEMORAL-FEMORAL ARTERY WITH BILATERAL ENDARTERECTOMY ;  Surgeon: Elam Dutch, MD;  Location[facility] OR;  Service: Vascular;  Laterality: N/A;  . FEMORAL-POPLITEAL BYPASS GRAFT Left 12/09/2013   Procedure: BYPASS GRAFT FEMORAL-POPLITEAL ARTERY - LEFT ;  Surgeon: Elam Dutch, MD;  Location: [place];  Service: Vascular;  Laterality: Left;  . LAMINECTOMY  09/08/2013   L 2 L3 L4 L5       Dr Luiz Ochoa    Allergies  Allergen Reactions  . Morphine And Related Itching    Current Outpatient Medications  Medication Sig Dispense Refill  . acetaminophen (TYLENOL) 500 MG tablet Take 1,000 mg by mouth every 6 (six) hours as needed for mild pain or headache.     Marland Kitchen amLODipine (NORVASC) 10 MG tablet Take 10 mg by mouth daily.    Marland Kitchen aspirin EC 325 MG tablet Take 325 mg by mouth daily.    . cholecalciferol (VITAMIN D) 1000 units tablet Take 1,000 Units by mouth daily.    . feeding supplement, ENSURE COMPLETE, (ENSURE COMPLETE) LIQD Take 237 mLs by mouth 3 (three) times daily with meals.    . ferrous sulfate 325 (65 FE) MG EC tablet Take 325 mg by mouth daily with breakfast.    . lisinopril (PRINIVIL,ZESTRIL) 40 MG tablet Take 40 mg by mouth daily.  5  . sertraline (ZOLOFT) 100 MG tablet Take 100 mg by mouth daily.    . simvastatin (ZOCOR) 20 MG tablet Take 20 mg by mouth daily.     No current facility-administered medications for this visit.      REVIEW OF SYSTEMS: See HPI for pertinent positives and negatives.  Physical Examination Vitals:   09/02/18 1404 09/02/18 1407  BP: (!) 150/78 (!) 152/78  Pulse: 76 78  Resp: 18   Temp: (!) 97.1 F (36.2 C)   TempSrc:  Oral   Weight: 127 lb (57.6 kg)   Height: 5\' 4"  (1.626 m)    Body mass index is 21.8 kg/m.  General:  WDWN in NAD Gait: using rolling walker with a seat, left leg lag, brace on left lower leg HENT: no gross abnormalities  Eyes: Pupils equal Pulmonary: normal non-labored breathing, good air movement in all fields, CTAB, no rales, rhonchi, or wheezes Cardiac: RRR, no murmur detected Abdomen: soft, NT, no masses palpated Skin: no rashes, no ulcers, no cellulitis.   VASCULAR EXAM  Carotid Bruits Right Left   Positive  Positive  Radial pulses are 2+ palpable bilaterally   Adominal aortic pulse is not palpable                      VASCULAR EXAM: Extremities without ischemic changes, without Gangrene; without open wounds. Fem-fem graft pulse is readily palpable.  Thick toenails of both great toes and left 2nd-4th toes.                                                                                                           LE Pulses Right Left       FEMORAL  2+ palpable  2+ palpable        POPLITEAL  not palpable   not palpable       POSTERIOR TIBIAL  not palpable   1+ palpable        DORSALIS PEDIS      ANTERIOR TIBIAL 1+ palpable  2+ palpable     Musculoskeletal: no muscle wasting or atrophy; no peripheral edema  Neurologic:  A&O X 3; appropriate affect, sensation is normal; speech is normal, CN 2-12 intact, pain and light touch intact in extremities, motor exam as listed above. Psychiatric: Normal thought content, mood appropriate to clinical situation.    ASSESSMENT:  Thomas Ferrell is a 77 y.o. 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 He is also s/p right CEA on 10/21/06. He had a preoperative TIA; he still has balance issues since this. He has not had any known subsequent neurological event.  He walks at least 30 minutes daily.  He does not seem to have claudication in his  legs, he does have neuropathic pain in his left foot that he states is related to his lumbar spine issues, has has had 2 lumbar spine surgeries. There are no signs of ischemia in his feet/legs.   DATA  Carotid Duplex (09-02-18): Right ICA: CEA site with 1-39% stenosis Left ICA: 40-59% stenosis Bilateral vertebral artery flow is antegrade.  Left subclavian artery waveforms are normal, right are disturbed.  Increased stenosis in the bilateral ICA, and right subclavian artery waveforms are disturbed compared to the exam on 02-07-17.    Fem-fem bypass Graft Duplex (08-29-18): Widely patent right to left fem-fem BPG without evidence of restenosis or hyperplasia. Elevated velocity in the right inflow artery suggestive of 50-74% stenosis (317 cm/s, no change compared to 08-15-17), however vessel not well visualized on 2D imaging.    Left LE arterial duplex (08-29-18): Left: Widely patent left fem-pop BPG without evidence of restenosis or hyperplasia. Tri and biphasic waveforms.  No significant change compared to the exams on 01-26-16 and 02-07-17.    ABI (Date: 09/02/2018):  R:   ABI: 0.72 (was 0.70 on 08-15-17),   PT: not documented  DP: not documented  TBI:  0.78, toe pressure 0.78 (was 0.80)  L:   ABI: 0.88 (was 0.88),   PT: tri  DP: tri  TBI: 0.44, toe pressure 75, (was 0.87)  Stable  bilateral ABI and right TBI; moderate disease in the right, mild in the left.  Decline in left TBI.   PLAN:  Referral to podiatrist to trim his thick toenails on a regular basis.   Daily seated leg exercises discussed and demonstrated.  Based on the patient's vascular studies and examination, pt will return to clinic in 1 year with ABI's, fem-fem bypass graft duplex, left LE arterial duplex, andcarotid duplex.  Pt knows to notify us if he develops concerns re the circulation in his legs.     I discussed in depth with the patient the nature of atherosclerosis, and emphasized the  importance of maximal medical management including strict control of blood pressure, blood glucose, and lipid levels, obtaining regular exercise, and cessation of smoking.  The patient is aware that without maximal medical management the underlying atherosclerotic disease process will progress, limiting the benefit of any interventions.  The patient was given information about stroke prevention and what symptoms should prompt the patient to seek immediate medical care.  The patient was given information about PAD including signs, symptoms, treatment, what symptoms should prompt the patient to seek immediate medical care, and risk reduction measures to take.  Thank you for allowing [place] to participate in this patient's care.  Clemon Chambers, RN, MSN, FNP-C [facility] Office: [phone]  Clinic MD: Bishop Dublin 09/02/2018 2:33 PM

## 2018-09-02 NOTE — Patient Instructions (Signed)
Stroke Prevention Some health problems and behaviors may make it more likely for you to have a stroke. Below are ways to lessen your risk of having a stroke.  Be active for at least 30 minutes on most or all days.  Do not smoke. Try not to be around others who smoke.  Do not drink too much alcohol. ? Do not have more than 2 drinks a day if you are a man. ? Do not have more than 1 drink a day if you are a woman and are not pregnant.  Eat healthy foods, such as fruits and vegetables. If you were put on a specific diet, follow the diet as told.  Keep your cholesterol levels under control through diet and medicines. Look for foods that are low in saturated fat, trans fat, cholesterol, and are high in fiber.  If you have diabetes, follow all diet plans and take your medicine as told.  Ask your doctor if you need treatment to lower your blood pressure. If you have high blood pressure (hypertension), follow all diet plans and take your medicine as told by your doctor.  If you are 18-39 years old, have your blood pressure checked every 3-5 years. If you are age 40 or older, have your blood pressure checked every year.  Keep a healthy weight. Eat foods that are low in calories, salt, saturated fat, trans fat, and cholesterol.  Do not take drugs.  Avoid birth control pills, if this applies. Talk to your doctor about the risks of taking birth control pills.  Talk to your doctor if you have sleep problems (sleep apnea).  Take all medicine as told by your doctor. ? You may be told to take aspirin or blood thinner medicine. Take this medicine as told by your doctor. ? Understand your medicine instructions.  Make sure any other conditions you have are being taken care of.  Get help right away if:  You suddenly lose feeling (you feel numb) or have weakness in your face, arm, or leg.  Your face or eyelid hangs down to one side.  You suddenly feel confused.  You have trouble talking  (aphasia) or understanding what people are saying.  You suddenly have trouble seeing in one or both eyes.  You suddenly have trouble walking.  You are dizzy.  You lose your balance or your movements are clumsy (uncoordinated).  You suddenly have a very bad headache and you do not know the cause.  You have new chest pain.  Your heart feels like it is fluttering or skipping a beat (irregular heartbeat). Do not wait to see if the symptoms above go away. Get help right away. Call your local emergency services (911 in [place] Do not drive yourself to the hospital. This information is not intended to replace advice given to you by your health care provider. Make sure you discuss any questions you have with your health care provider. Document Released: 04/29/2012 Document Revised: 04/05/2016 Document Reviewed: 05/01/2013 Elsevier Interactive Patient Education  2018 [employer] Inc[employer]     Peripheral Vascular Disease Peripheral vascular disease (PVD) is a disease of the blood vessels that are not part of your heart and brain. A simple term for PVD is poor circulation. In most cases, PVD narrows the blood vessels that carry blood from your heart to the rest of your body. This can result in a decreased supply of blood to your arms, legs, and internal organs, like your stomach or kidneys. However, it most often affects   a person's lower legs and feet. There are two types of PVD.  Organic PVD. This is the more common type. It is caused by damage to the structure of blood vessels.  Functional PVD. This is caused by conditions that make blood vessels contract and tighten (spasm).  Without treatment, PVD tends to get worse over time. PVD can also lead to acute ischemic limb. This is when an arm or limb suddenly has trouble getting enough blood. This is a medical emergency. Follow these instructions at home:  Take medicines only as told by your doctor.  Do not use any tobacco products, including  cigarettes, chewing tobacco, or electronic cigarettes. If you need help quitting, ask your doctor.  Lose weight if you are overweight, and maintain a healthy weight as told by your doctor.  Eat a diet that is low in fat and cholesterol. If you need help, ask your doctor.  Exercise regularly. Ask your doctor for some good activities for you.  Take good care of your feet. ? Wear comfortable shoes that fit well. ? Check your feet often for any cuts or sores. Contact a doctor if:  You have cramps in your legs while walking.  You have leg pain when you are at rest.  You have coldness in a leg or foot.  Your skin changes.  You are unable to get or have an erection (erectile dysfunction).  You have cuts or sores on your feet that are not healing. Get help right away if:  Your arm or leg turns cold and blue.  Your arms or legs become red, warm, swollen, painful, or numb.  You have chest pain or trouble breathing.  You suddenly have weakness in your face, arm, or leg.  You become very confused or you cannot speak.  You suddenly have a very bad headache.  You suddenly cannot see. This information is not intended to replace advice given to you by your health care provider. Make sure you discuss any questions you have with your health care provider. Document Released: 01/23/2010 Document Revised: 04/05/2016 Document Reviewed: 04/08/2014 Elsevier Interactive Patient Education  2017 [employer] Inc.  

## 2018-09-17 ENCOUNTER — Ambulatory Visit: Payer: Medicare Other | Admitting: Podiatry

## 2018-12-01 DIAGNOSIS — I10 Essential (primary) hypertension: Secondary | ICD-10-CM | POA: Diagnosis not present

## 2018-12-01 DIAGNOSIS — E785 Hyperlipidemia, unspecified: Secondary | ICD-10-CM | POA: Diagnosis not present

## 2018-12-01 DIAGNOSIS — F419 Anxiety disorder, unspecified: Secondary | ICD-10-CM | POA: Diagnosis not present

## 2018-12-01 DIAGNOSIS — Z0000 Encounter for general adult medical examination without abnormal findings: Secondary | ICD-10-CM | POA: Diagnosis not present

## 2018-12-01 DIAGNOSIS — Z1211 Encounter for screening for malignant neoplasm of colon: Secondary | ICD-10-CM | POA: Diagnosis not present

## 2018-12-01 DIAGNOSIS — K9189 Other postprocedural complications and disorders of digestive system: Secondary | ICD-10-CM | POA: Diagnosis not present

## 2019-07-06 DIAGNOSIS — F322 Major depressive disorder, single episode, severe without psychotic features: Secondary | ICD-10-CM | POA: Diagnosis not present

## 2019-07-06 DIAGNOSIS — F419 Anxiety disorder, unspecified: Secondary | ICD-10-CM | POA: Diagnosis not present

## 2019-07-06 DIAGNOSIS — J449 Chronic obstructive pulmonary disease, unspecified: Secondary | ICD-10-CM | POA: Diagnosis not present

## 2019-07-06 DIAGNOSIS — E785 Hyperlipidemia, unspecified: Secondary | ICD-10-CM | POA: Diagnosis not present

## 2019-07-06 DIAGNOSIS — I10 Essential (primary) hypertension: Secondary | ICD-10-CM | POA: Diagnosis not present

## 2019-07-06 DIAGNOSIS — I739 Peripheral vascular disease, unspecified: Secondary | ICD-10-CM | POA: Diagnosis not present

## 2019-08-05 DIAGNOSIS — Z23 Encounter for immunization: Secondary | ICD-10-CM | POA: Diagnosis not present

## 2019-09-23 ENCOUNTER — Other Ambulatory Visit: Payer: Self-pay

## 2019-09-23 DIAGNOSIS — I6521 Occlusion and stenosis of right carotid artery: Secondary | ICD-10-CM

## 2019-09-23 DIAGNOSIS — I779 Disorder of arteries and arterioles, unspecified: Secondary | ICD-10-CM

## 2019-09-28 ENCOUNTER — Telehealth (HOSPITAL_COMMUNITY): Payer: Self-pay

## 2019-09-28 NOTE — Telephone Encounter (Signed)

## 2019-09-29 ENCOUNTER — Other Ambulatory Visit: Payer: Self-pay

## 2019-09-29 ENCOUNTER — Ambulatory Visit (INDEPENDENT_AMBULATORY_CARE_PROVIDER_SITE_OTHER)
Admission: RE | Admit: 2019-09-29 | Discharge: 2019-09-29 | Disposition: A | Discharge status: Home / Self Care | Payer: Medicare Other | Source: Ambulatory Visit | Attending: Family | Admitting: Family

## 2019-09-29 ENCOUNTER — Ambulatory Visit (HOSPITAL_COMMUNITY)
Admission: RE | Admit: 2019-09-29 | Discharge: 2019-09-29 | Disposition: A | Discharge status: Home / Self Care | Payer: Medicare Other | Source: Ambulatory Visit | Attending: Family | Admitting: Family

## 2019-09-29 DIAGNOSIS — I779 Disorder of arteries and arterioles, unspecified: Secondary | ICD-10-CM

## 2019-10-01 ENCOUNTER — Telehealth (HOSPITAL_COMMUNITY): Payer: Self-pay | Admitting: *Deleted

## 2019-10-01 NOTE — Telephone Encounter (Signed)

## 2019-10-02 ENCOUNTER — Ambulatory Visit (INDEPENDENT_AMBULATORY_CARE_PROVIDER_SITE_OTHER)
Admission: RE | Admit: 2019-10-02 | Discharge: 2019-10-02 | Disposition: A | Discharge status: Home / Self Care | Payer: Medicare Other | Source: Ambulatory Visit | Attending: Family | Admitting: Family

## 2019-10-02 ENCOUNTER — Ambulatory Visit (HOSPITAL_COMMUNITY)
Admission: RE | Admit: 2019-10-02 | Discharge: 2019-10-02 | Disposition: A | Discharge status: Home / Self Care | Payer: Medicare Other | Source: Ambulatory Visit | Attending: Family | Admitting: Family

## 2019-10-02 ENCOUNTER — Ambulatory Visit (INDEPENDENT_AMBULATORY_CARE_PROVIDER_SITE_OTHER): Payer: Medicare Other | Admitting: Family

## 2019-10-02 ENCOUNTER — Encounter: Payer: Self-pay | Admitting: Family

## 2019-10-02 ENCOUNTER — Other Ambulatory Visit: Payer: Self-pay

## 2019-10-02 VITALS — BP 149/65 | HR 66 | Temp 97.3°F | Resp 18 | Ht 64.0 in | Wt 126.0 lb

## 2019-10-02 DIAGNOSIS — I6521 Occlusion and stenosis of right carotid artery: Secondary | ICD-10-CM

## 2019-10-02 DIAGNOSIS — I779 Disorder of arteries and arterioles, unspecified: Secondary | ICD-10-CM | POA: Diagnosis not present

## 2019-10-02 DIAGNOSIS — Z9889 Other specified postprocedural states: Secondary | ICD-10-CM

## 2019-10-02 DIAGNOSIS — Z95828 Presence of other vascular implants and grafts: Secondary | ICD-10-CM

## 2019-10-02 DIAGNOSIS — Z87891 Personal history of nicotine dependence: Secondary | ICD-10-CM | POA: Diagnosis not present

## 2019-10-02 DIAGNOSIS — I6523 Occlusion and stenosis of bilateral carotid arteries: Secondary | ICD-10-CM

## 2019-10-02 NOTE — Progress Notes (Signed)
[facility] [place] HISTORY AND PHYSICAL   CC: Follow up extracranial carotid artery stenosis and peripheral artery occlusive disease     History of Present Illness:   Thomas Ferrell is a 78 y.o. male who is s/p bilateral common femoral endarterectomy, right to left femoral-femoral bypass 8 mm Dacron, left femoral to above knee popliteal bypass with 6 mm Propaten on 12-09-13 by [name] for ischemic left leg.  He is also s/p right CEA on 10/21/06. He had a preoperative TIA; he still has balance issues since this. He denies any history of amaurosis fugax or speech difficulties. He denies any subsequent neurological events.  He still complains of burning numbness tingling in his left foot. He wasbeing followed for this at the pain clinic. He denies claudication type symptoms with walking.  He also reports that his left great toe has toe drop, he has alower leg bracethat helps this. 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 He walks with a quad cane or walker, states no difficulties with falling, denies non healing wounds. Pt reports that he walks about a mile daily and wears a brace on his left leg for foot drop since his back surgery.  In July 2017 he was admitted to [facility] with CAP, obstruction of common bile duct, also had a cholecystectomy.   Diabetic: No Tobacco use: former smoker, quit in 2007  Pt meds include: Statin :Yes Betablocker: No ASA: yes, 81 mg daily Other anticoagulants/antiplatelets: no   Current Outpatient Medications  Medication Sig Dispense Refill  . acetaminophen (TYLENOL) 500 MG tablet Take 1,000 mg by mouth every 6 (six) hours as needed for mild pain or headache.     Marland Kitchen amLODipine (NORVASC) 10 MG tablet Take 10 mg by mouth daily.    Marland Kitchen aspirin EC 325 MG tablet Take 325 mg by mouth daily.    . cholecalciferol (VITAMIN D) 1000 units tablet Take  1,000 Units by mouth daily.    . feeding supplement, ENSURE COMPLETE, (ENSURE COMPLETE) LIQD Take 237 mLs by mouth 3 (three) times daily with meals.    . ferrous sulfate 325 (65 FE) MG EC tablet Take 325 mg by mouth daily with breakfast.    . lisinopril (PRINIVIL,ZESTRIL) 40 MG tablet Take 40 mg by mouth daily.  5  . sertraline (ZOLOFT) 100 MG tablet Take 100 mg by mouth daily.    . simvastatin (ZOCOR) 20 MG tablet Take 20 mg by mouth daily.     No current facility-administered medications for this visit.     Past Medical History:  Diagnosis Date  . Abnormality of gait 03/03/2013  . bladder ca dx'd 11/2009   chemo/xrt comp 02/2010  . Cerebrovascular disease    right brain CVA 2007  . COPD (chronic obstructive pulmonary disease) ([place])   . Cough productive of clear sputum    TX RECENT SINUS INFECTION   . CVA (cerebral vascular accident) ([place]) 2007   r-cva  . Dyslipidemia   . Hypertension   . Lumbago     Social History Social History   Tobacco Use  . Smoking status: Former Smoker    Types: Cigarettes    Quit date: 11/12/2005    Years since quitting: 13.8  . Smokeless tobacco: Never Used  Substance Use Topics  . Alcohol use: No  . Drug use: No    Family History Family History  Problem Relation Age  of Onset  . Stroke Mother   . Cancer Father   . Dementia Father   . Cancer Sister     Surgical History Past Surgical History:  Procedure Laterality Date  . ABDOMINAL AORTAGRAM N/A 12/07/2013   Procedure: ABDOMINAL Maxcine Ham;  Surgeon: Angelia Mould, MD;  Location: [facility] CATH LAB;  Service: Cardiovascular;  Laterality: N/A;  . BACK SURGERY  2010  . bladder cancer    . CAROTID ARTERY ANGIOPLASTY  2009  . CHOLECYSTECTOMY N/A 06/06/2016   Procedure: LAPAROSCOPIC CHOLECYSTECTOMY WITH INTRAOPERATIVE CHOLANGIOGRAM;  Surgeon: Armandina Gemma, MD;  Location: [facility] ORS;  Service: General;  Laterality: N/A;  . ERCP N/A 06/05/2016   Procedure: ENDOSCOPIC RETROGRADE  CHOLANGIOPANCREATOGRAPHY (ERCP);  Surgeon: Teena Irani, MD;  Location: [facility] ENDOSCOPY;  Service: Endoscopy;  Laterality: N/A;  . ESOPHAGOGASTRODUODENOSCOPY (EGD) WITH PROPOFOL N/A 06/05/2016   Procedure: ESOPHAGOGASTRODUODENOSCOPY (EGD);  Surgeon: Teena Irani, MD;  Location: [facility] ENDOSCOPY;  Service: Endoscopy;  Laterality: N/A;  . FEMORAL-FEMORAL BYPASS GRAFT N/A 12/09/2013   Procedure: BYPASS GRAFT FEMORAL-FEMORAL ARTERY WITH BILATERAL ENDARTERECTOMY ;  Surgeon: Elam Dutch, MD;  Location: [facility] OR;  Service: Vascular;  Laterality: N/A;  . FEMORAL-POPLITEAL BYPASS GRAFT Left 12/09/2013   Procedure: BYPASS GRAFT FEMORAL-POPLITEAL ARTERY - LEFT ;  Surgeon: Elam Dutch, MD;  Location: [place];  Service: Vascular;  Laterality: Left;  . LAMINECTOMY  09/08/2013   L 2 L3 L4 L5       Dr Luiz Ochoa    Allergies  Allergen Reactions  . Morphine And Related Itching    Current Outpatient Medications  Medication Sig Dispense Refill  . acetaminophen (TYLENOL) 500 MG tablet Take 1,000 mg by mouth every 6 (six) hours as needed for mild pain or headache.     Marland Kitchen amLODipine (NORVASC) 10 MG tablet Take 10 mg by mouth daily.    Marland Kitchen aspirin EC 325 MG tablet Take 325 mg by mouth daily.    . cholecalciferol (VITAMIN D) 1000 units tablet Take 1,000 Units by mouth daily.    . feeding supplement, ENSURE COMPLETE, (ENSURE COMPLETE) LIQD Take 237 mLs by mouth 3 (three) times daily with meals.    . ferrous sulfate 325 (65 FE) MG EC tablet Take 325 mg by mouth daily with breakfast.    . lisinopril (PRINIVIL,ZESTRIL) 40 MG tablet Take 40 mg by mouth daily.  5  . sertraline (ZOLOFT) 100 MG tablet Take 100 mg by mouth daily.    . simvastatin (ZOCOR) 20 MG tablet Take 20 mg by mouth daily.     No current facility-administered medications for this visit.      REVIEW OF SYSTEMS: See HPI for pertinent positives and negatives.  Physical Examination Vitals:   10/02/19 1307 10/02/19 1311  BP: (!) 157/75 (!) 149/65  Pulse: 66 66   Resp: 18   Temp: (!) 97.3 F (36.3 C)   TempSrc: Temporal   Weight: 126 lb (57.2 kg)   Height: 5\' 4"  (1.626 m)    Body mass index is 21.63 kg/m.  General:  WDWN in NAD Gait: slow, steady, using rolling walker with a seat, left leg lag, brace on left lower leg  HEENT: no gross abnormalities  Eyes: Pupils equal and round Pulmonary: normal non-labored breathing, fair air movement in all fields, scattered crackles in lower half of posterior fields, no rhonchi or wheezes Cardiac: RRR, no murmur detected Abdomen: soft, NT, no masses palpated Skin: no rashes, no ulcers, no cellulitis. Scab remains at left pretibial area, no erythema,  no drainage   VASCULAR EXAM  Carotid Bruits Right Left   Positive Negative      Radial pulses are 2+ palpable bilaterally   Adominal aortic pulse is not palpable     Fem-fem bypass graft pulse is briskly palpable                   VASCULAR EXAM: Extremities without ischemic changes, without Gangrene; without open wounds. Thick trimmed toenails of both great toes and left 2nd-4th toes.                                                                                                           LE Pulses Right Left       FEMORAL  2+ palpable  2+ palpable        POPLITEAL  not palpable   not palpable       POSTERIOR TIBIAL  not palpable   not palpable        DORSALIS PEDIS      ANTERIOR TIBIAL 1+ palpable  1-2+ palpable     Musculoskeletal: no muscle wasting or atrophy; no peripheral edema  Neurologic:  A&O X 3; appropriate affect, sensation is normal; speech is normal, CN 2-12 intact, pain and light touch intact in extremities, motor exam as listed above. Psychiatric: Normal thought content, mood appropriate to clinical situation.    DATA  Carotid Duplex (09-29-19): Right Carotid: Velocities in the right ICA are consistent with a 1-39% stenosis. Left Carotid: Velocities in the left ICA are consistent with a 40-59% stenosis. Vertebrals:   Bilateral vertebral arteries demonstrate antegrade flow. Subclavians: Normal flow hemodynamics were seen in bilateral subclavian arteries. No change compared to the exam on 09-02-18.   Fem-fem bypass Graft Duplex (10-02-19): Left Graft #1: Left femoro-popliteal bypass graft                      PSV cm/sStenosisWaveform Comments +--------------------+--------+--------+---------+--------+ Inflow              41              biphasic dampened +--------------------+--------+--------+---------+--------+ Proximal Anastomosis53              triphasic         +--------------------+--------+--------+---------+--------+ Proximal Graft      130             biphasic          +--------------------+--------+--------+---------+--------+ Mid Graft           49              biphasic          +--------------------+--------+--------+---------+--------+ Distal Graft        57              biphasic          +--------------------+--------+--------+---------+--------+ Distal Anastomosis  64              biphasic          +--------------------+--------+--------+---------+--------+ Outflow  32              biphasic          +--------------------+--------+--------+---------+--------+ Summary: Left: Patent left femoral to popliteal bypass graft with no evidence for restenosis.   Left LE Arterial Duplex (10-02-19): Left Graft #1: Left femoro-popliteal bypass graft                      PSV cm/sStenosisWaveform Comments +--------------------+--------+--------+---------+--------+ Inflow              41              biphasic dampened +--------------------+--------+--------+---------+--------+ Proximal Anastomosis53              triphasic         +--------------------+--------+--------+---------+--------+ Proximal Graft      130             biphasic          +--------------------+--------+--------+---------+--------+ Mid Graft            49              biphasic          +--------------------+--------+--------+---------+--------+ Distal Graft        57              biphasic          +--------------------+--------+--------+---------+--------+ Distal Anastomosis  64              biphasic          +--------------------+--------+--------+---------+--------+ Outflow             32              biphasic          +--------------------+--------+--------+---------+--------+ Summary: Left: Patent left femoral to popliteal bypass graft with no evidence for restenosis.    ABI Findings (10-02-19): +---------+------------------+-----+--------+--------+ Right    Rt Pressure (mmHg)IndexWaveformComment  +---------+------------------+-----+--------+--------+ Brachial 148                                     +---------+------------------+-----+--------+--------+ ATA      114               0.77                  +---------+------------------+-----+--------+--------+ PTA      110               0.74 biphasic         +---------+------------------+-----+--------+--------+ DP                              biphasic         +---------+------------------+-----+--------+--------+ Great Toe80                0.54                  +---------+------------------+-----+--------+--------+  +---------+------------------+-----+---------+-------+ Left     Lt Pressure (mmHg)IndexWaveform Comment +---------+------------------+-----+---------+-------+ Brachial 144                                     +---------+------------------+-----+---------+-------+ ATA      123               0.83                  +---------+------------------+-----+---------+-------+  PTA      120               0.81 triphasic        +---------+------------------+-----+---------+-------+ DP                              biphasic          +---------+------------------+-----+---------+-------+ Great Toe86                0.58                  +---------+------------------+-----+---------+-------+  +-------+-----------+-----------+------------+------------+ ABI/TBIToday's ABIToday's TBIPrevious ABIPrevious TBI +-------+-----------+-----------+------------+------------+ Right  0.77       0.54       0.72        0.78         +-------+-----------+-----------+------------+------------+ Left   0.83       0.58       0.88        0.44         +-------+-----------+-----------+------------+------------+ Bilateral ABIs appear essentially unchanged compared to prior study on 08/29/2018. Summary: Right: Resting right ankle-brachial index indicates moderate right lower extremity arterial disease. The right toe-brachial index is normal. RT great toe pressure = 80 mmHg. Left: Resting left ankle-brachial index indicates mild left lower extremity arterial disease. The left toe-brachial index is abnormal. LT Great toe pressure = 86 mmHg.   ASSESSMENT:  Thomas Ferrell is a 78 y.o. male who is s/p bilateral common femoral endarterectomy, right to left femoral-femoral bypass 8 mm Dacron, left femoral to above knee popliteal bypass with 6 mm Propaten on 12-09-13 by [name] for ischemic left leg.  He is also s/p right CEA on 10/21/06. He had a preoperative TIA; he still has balance issues since this. He has not had any known subsequent neurological event.  He walks a mile daily.  He does not seem to have claudication in his legs with walking, he does have neuropathic pain in his left footthat he states is related to his lumbar spine issues, has has had 2 lumbar spine surgeries. There are no signs of ischemia in his feet/legs.   Stable results of non invasive testing compared to a year ago.    PLAN:   Daily seated leg exercises discussed and demonstrated.  Continue walking with walker. Based on the patient's  vascular studies and examination, pt will return to clinic in1 yearwith ABI's, fem-fem bypass graft duplex, left LE arterial duplex,andcarotid duplex.  Pt knows to notify us if he develops concerns re the circulation in his legs.    I discussed in depth with the patient the nature of atherosclerosis, and emphasized the importance of maximal medical management including strict control of blood pressure, blood glucose, and lipid levels, obtaining regular exercise, and cessation of smoking.  The patient is aware that without maximal medical management the underlying atherosclerotic disease process will progress, limiting the benefit of any interventions.  The patient was given information about stroke prevention and what symptoms should prompt the patient to seek immediate medical care.  The patient was given information about PAD including signs, symptoms, treatment, what symptoms should prompt the patient to seek immediate medical care, and risk reduction measures to take.  Thank you for allowing [place] to participate in this patient's care.  Clemon Chambers, RN, MSN, FNP-C [facility] Office: [phone]  Clinic MD: Donzetta Matters 10/02/2019 1:19 PM

## 2019-10-02 NOTE — Patient Instructions (Signed)
Stroke Prevention Some medical conditions and lifestyle choices can lead to a higher risk for a stroke. You can help to prevent a stroke by making nutrition, lifestyle, and other changes. What nutrition changes can be made?   Eat healthy foods. ? Choose foods that are high in fiber. These include:  Fresh fruits.  Fresh vegetables.  Whole grains. ? Eat at least 5 or more servings of fruits and vegetables each day. Try to fill half of your plate at each meal with fruits and vegetables. ? Choose lean protein foods. These include:  Lowfat (lean) cuts of meat.  Chicken without skin.  Fish.  Tofu.  Beans.  Nuts. ? Eat low-fat dairy products. ? Avoid foods that:  Are high in salt (sodium).  Have saturated fat.  Have trans fat.  Have cholesterol.  Are processed.  Are premade.  Follow eating guidelines as told by your doctor. These may include: ? Reducing how many calories you eat and drink each day. ? Limiting how much salt you eat or drink each day to 1,500 milligrams (mg). ? Using only healthy fats for cooking. These include:  Olive oil.  Canola oil.  Sunflower oil. ? Counting how many carbohydrates you eat and drink each day. What lifestyle changes can be made?  Try to stay at a healthy weight. Talk to your doctor about what a good weight is for you.  Get at least 30 minutes of moderate physical activity at least 5 days a week. This can include: ? Fast walking. ? Biking. ? Swimming.  Do not use any products that have nicotine or tobacco. This includes cigarettes and e-cigarettes. If you need help quitting, ask your doctor. Avoid being around tobacco smoke in general.  Limit how much alcohol you drink to no more than 1 drink a day for nonpregnant women and 2 drinks a day for men. One drink equals 12 oz of beer, 5 oz of wine, or 1 oz of hard liquor.  Do not use drugs.  Avoid taking birth control pills. Talk to your doctor about the risks of taking birth  control pills if: ? You are over 35 years old. ? You smoke. ? You get migraines. ? You have had a blood clot. What other changes can be made?  Manage your cholesterol. ? It is important to eat a healthy diet. ? If your cholesterol cannot be managed through your diet, you may also need to take medicines. Take medicines as told by your doctor.  Manage your diabetes. ? It is important to eat a healthy diet and to exercise regularly. ? If your blood sugar cannot be managed through diet and exercise, you may need to take medicines. Take medicines as told by your doctor.  Control your high blood pressure (hypertension). ? Try to keep your blood pressure below 130/80. This can help lower your risk of stroke. ? It is important to eat a healthy diet and to exercise regularly. ? If your blood pressure cannot be managed through diet and exercise, you may need to take medicines. Take medicines as told by your doctor. ? Ask your doctor if you should check your blood pressure at home. ? Have your blood pressure checked every year. Do this even if your blood pressure is normal.  Talk to your doctor about getting checked for a sleep disorder. Signs of this can include: ? Snoring a lot. ? Feeling very tired.  Take over-the-counter and prescription medicines only as told by your doctor. These may   include aspirin or blood thinners (antiplatelets or anticoagulants).  Make sure that any other medical conditions you have are managed. Where to find more information  American Stroke Association: www.strokeassociation.org  National Stroke Association: www.stroke.org Get help right away if:  You have any symptoms of stroke. "BE FAST" is an easy way to remember the main warning signs: ? B - Balance. Signs are dizziness, sudden trouble walking, or loss of balance. ? E - Eyes. Signs are trouble seeing or a sudden change in how you see. ? F - Face. Signs are sudden weakness or loss of feeling of the face,  or the face or eyelid drooping on one side. ? A - Arms. Signs are weakness or loss of feeling in an arm. This happens suddenly and usually on one side of the body. ? S - Speech. Signs are sudden trouble speaking, slurred speech, or trouble understanding what people say. ? T - Time. Time to call emergency services. Write down what time symptoms started.  You have other signs of stroke, such as: ? A sudden, very bad headache with no known cause. ? Feeling sick to your stomach (nausea). ? Throwing up (vomiting). ? Jerky movements you cannot control (seizure). These symptoms may represent a serious problem that is an emergency. Do not wait to see if the symptoms will go away. Get medical help right away. Call your local emergency services (911 in the [place] Do not drive yourself to the hospital. Summary  You can prevent a stroke by eating healthy, exercising, not smoking, drinking less alcohol, and treating other health problems, such as diabetes, high blood pressure, or high cholesterol.  Do not use any products that contain nicotine or tobacco, such as cigarettes and e-cigarettes.  Get help right away if you have any signs or symptoms of a stroke. This information is not intended to replace advice given to you by your health care provider. Make sure you discuss any questions you have with your health care provider. Document Released: 04/29/2012 Document Revised: 12/25/2018 Document Reviewed: 01/30/2017 Elsevier Patient Education  2020 Elsevier Inc.     Peripheral Vascular Disease  Peripheral vascular disease (PVD) is a disease of the blood vessels that are not part of your heart and brain. A simple term for PVD is poor circulation. In most cases, PVD narrows the blood vessels that carry blood from your heart to the rest of your body. This can reduce the supply of blood to your arms, legs, and internal organs, like your stomach or kidneys. However, PVD most often affects a person's lower  legs and feet. Without treatment, PVD tends to get worse. PVD can also lead to acute ischemic limb. This is when an arm or leg suddenly cannot get enough blood. This is a medical emergency. Follow these instructions at home: Lifestyle  Do not use any products that contain nicotine or tobacco, such as cigarettes and e-cigarettes. If you need help quitting, ask your doctor.  Lose weight if you are overweight. Or, stay at a healthy weight as told by your doctor.  Eat a diet that is low in fat and cholesterol. If you need help, ask your doctor.  Exercise regularly. Ask your doctor for activities that are right for you. General instructions  Take over-the-counter and prescription medicines only as told by your doctor.  Take good care of your feet: ? Wear comfortable shoes that fit well. ? Check your feet often for any cuts or sores.  Keep all follow-up visits as told   by your doctor This is important. Contact a doctor if:  You have cramps in your legs when you walk.  You have leg pain when you are at rest.  You have coldness in a leg or foot.  Your skin changes.  You are unable to get or have an erection (erectile dysfunction).  You have cuts or sores on your feet that do not heal. Get help right away if:  Your arm or leg turns cold, numb, and blue.  Your arms or legs become red, warm, swollen, painful, or numb.  You have chest pain.  You have trouble breathing.  You suddenly have weakness in your face, arm, or leg.  You become very confused or you cannot speak.  You suddenly have a very bad headache.  You suddenly cannot see. Summary  Peripheral vascular disease (PVD) is a disease of the blood vessels.  A simple term for PVD is poor circulation. Without treatment, PVD tends to get worse.  Treatment may include exercise, low fat and low cholesterol diet, and quitting smoking. This information is not intended to replace advice given to you by your health care  provider. Make sure you discuss any questions you have with your health care provider. Document Released: 01/23/2010 Document Revised: 10/11/2017 Document Reviewed: 12/06/2016 Elsevier Patient Education  2020 Elsevier Inc.  

## 2019-10-27 ENCOUNTER — Other Ambulatory Visit: Payer: Self-pay

## 2019-10-27 DIAGNOSIS — I6523 Occlusion and stenosis of bilateral carotid arteries: Secondary | ICD-10-CM

## 2019-10-27 DIAGNOSIS — I779 Disorder of arteries and arterioles, unspecified: Secondary | ICD-10-CM

## 2019-12-25 DIAGNOSIS — Z23 Encounter for immunization: Secondary | ICD-10-CM | POA: Diagnosis not present

## 2020-01-06 DIAGNOSIS — E785 Hyperlipidemia, unspecified: Secondary | ICD-10-CM | POA: Diagnosis not present

## 2020-01-06 DIAGNOSIS — Z1211 Encounter for screening for malignant neoplasm of colon: Secondary | ICD-10-CM | POA: Diagnosis not present

## 2020-01-06 DIAGNOSIS — J449 Chronic obstructive pulmonary disease, unspecified: Secondary | ICD-10-CM | POA: Diagnosis not present

## 2020-01-06 DIAGNOSIS — F322 Major depressive disorder, single episode, severe without psychotic features: Secondary | ICD-10-CM | POA: Diagnosis not present

## 2020-01-06 DIAGNOSIS — Z0000 Encounter for general adult medical examination without abnormal findings: Secondary | ICD-10-CM | POA: Diagnosis not present

## 2020-01-06 DIAGNOSIS — Z125 Encounter for screening for malignant neoplasm of prostate: Secondary | ICD-10-CM | POA: Diagnosis not present

## 2020-01-06 DIAGNOSIS — I10 Essential (primary) hypertension: Secondary | ICD-10-CM | POA: Diagnosis not present

## 2020-01-06 DIAGNOSIS — F419 Anxiety disorder, unspecified: Secondary | ICD-10-CM | POA: Diagnosis not present

## 2020-01-06 DIAGNOSIS — I739 Peripheral vascular disease, unspecified: Secondary | ICD-10-CM | POA: Diagnosis not present

## 2020-01-11 DIAGNOSIS — Z1211 Encounter for screening for malignant neoplasm of colon: Secondary | ICD-10-CM | POA: Diagnosis not present

## 2020-01-22 DIAGNOSIS — Z23 Encounter for immunization: Secondary | ICD-10-CM | POA: Diagnosis not present

## 2020-08-13 DIAGNOSIS — Z23 Encounter for immunization: Secondary | ICD-10-CM | POA: Diagnosis not present

## 2020-08-23 DIAGNOSIS — Z23 Encounter for immunization: Secondary | ICD-10-CM | POA: Diagnosis not present

## 2020-09-06 DIAGNOSIS — Z23 Encounter for immunization: Secondary | ICD-10-CM | POA: Diagnosis not present

## 2021-02-27 DIAGNOSIS — M7541 Impingement syndrome of right shoulder: Secondary | ICD-10-CM | POA: Diagnosis not present

## 2021-04-11 DIAGNOSIS — E785 Hyperlipidemia, unspecified: Secondary | ICD-10-CM | POA: Diagnosis not present

## 2021-04-11 DIAGNOSIS — I10 Essential (primary) hypertension: Secondary | ICD-10-CM | POA: Diagnosis not present

## 2021-04-11 DIAGNOSIS — R319 Hematuria, unspecified: Secondary | ICD-10-CM | POA: Diagnosis not present

## 2021-04-11 DIAGNOSIS — I739 Peripheral vascular disease, unspecified: Secondary | ICD-10-CM | POA: Diagnosis not present

## 2021-04-11 DIAGNOSIS — Z0000 Encounter for general adult medical examination without abnormal findings: Secondary | ICD-10-CM | POA: Diagnosis not present

## 2021-04-11 DIAGNOSIS — J449 Chronic obstructive pulmonary disease, unspecified: Secondary | ICD-10-CM | POA: Diagnosis not present

## 2021-04-20 DIAGNOSIS — M542 Cervicalgia: Secondary | ICD-10-CM | POA: Diagnosis not present

## 2021-05-11 DIAGNOSIS — H25812 Combined forms of age-related cataract, left eye: Secondary | ICD-10-CM | POA: Diagnosis not present

## 2021-05-11 DIAGNOSIS — H25811 Combined forms of age-related cataract, right eye: Secondary | ICD-10-CM | POA: Diagnosis not present

## 2021-05-11 DIAGNOSIS — Z01818 Encounter for other preprocedural examination: Secondary | ICD-10-CM | POA: Diagnosis not present

## 2021-06-08 DIAGNOSIS — H2511 Age-related nuclear cataract, right eye: Secondary | ICD-10-CM | POA: Diagnosis not present

## 2021-06-08 DIAGNOSIS — H25811 Combined forms of age-related cataract, right eye: Secondary | ICD-10-CM | POA: Diagnosis not present

## 2021-06-22 DIAGNOSIS — H2512 Age-related nuclear cataract, left eye: Secondary | ICD-10-CM | POA: Diagnosis not present

## 2021-06-22 DIAGNOSIS — H25812 Combined forms of age-related cataract, left eye: Secondary | ICD-10-CM | POA: Diagnosis not present

## 2021-10-17 DIAGNOSIS — M79641 Pain in right hand: Secondary | ICD-10-CM | POA: Diagnosis not present

## 2021-10-17 DIAGNOSIS — M13831 Other specified arthritis, right wrist: Secondary | ICD-10-CM | POA: Diagnosis not present

## 2021-11-10 DIAGNOSIS — I10 Essential (primary) hypertension: Secondary | ICD-10-CM | POA: Diagnosis not present

## 2021-11-10 DIAGNOSIS — E876 Hypokalemia: Secondary | ICD-10-CM | POA: Diagnosis not present

## 2021-11-10 DIAGNOSIS — E785 Hyperlipidemia, unspecified: Secondary | ICD-10-CM | POA: Diagnosis not present

## 2021-11-10 DIAGNOSIS — I739 Peripheral vascular disease, unspecified: Secondary | ICD-10-CM | POA: Diagnosis not present

## 2021-11-10 DIAGNOSIS — J449 Chronic obstructive pulmonary disease, unspecified: Secondary | ICD-10-CM | POA: Diagnosis not present

## 2022-05-09 DIAGNOSIS — J449 Chronic obstructive pulmonary disease, unspecified: Secondary | ICD-10-CM | POA: Diagnosis not present

## 2022-05-09 DIAGNOSIS — E785 Hyperlipidemia, unspecified: Secondary | ICD-10-CM | POA: Diagnosis not present

## 2022-05-09 DIAGNOSIS — E876 Hypokalemia: Secondary | ICD-10-CM | POA: Diagnosis not present

## 2022-05-09 DIAGNOSIS — I739 Peripheral vascular disease, unspecified: Secondary | ICD-10-CM | POA: Diagnosis not present

## 2022-05-09 DIAGNOSIS — I10 Essential (primary) hypertension: Secondary | ICD-10-CM | POA: Diagnosis not present

## 2022-06-11 DIAGNOSIS — H903 Sensorineural hearing loss, bilateral: Secondary | ICD-10-CM | POA: Diagnosis not present

## 2022-08-22 ENCOUNTER — Other Ambulatory Visit: Payer: Self-pay | Admitting: *Deleted

## 2022-08-22 DIAGNOSIS — I70239 Atherosclerosis of native arteries of right leg with ulceration of unspecified site: Secondary | ICD-10-CM

## 2022-08-22 DIAGNOSIS — I739 Peripheral vascular disease, unspecified: Secondary | ICD-10-CM

## 2022-08-27 ENCOUNTER — Ambulatory Visit (INDEPENDENT_AMBULATORY_CARE_PROVIDER_SITE_OTHER)
Admission: RE | Admit: 2022-08-27 | Discharge: 2022-08-27 | Disposition: A | Discharge status: Home / Self Care | Payer: Medicare Other | Source: Ambulatory Visit | Attending: Surgery | Admitting: Surgery

## 2022-08-27 ENCOUNTER — Ambulatory Visit (HOSPITAL_COMMUNITY)
Admission: RE | Admit: 2022-08-27 | Discharge: 2022-08-27 | Disposition: A | Discharge status: Home / Self Care | Payer: Medicare Other | Source: Ambulatory Visit | Attending: Surgery | Admitting: Surgery

## 2022-08-27 DIAGNOSIS — I739 Peripheral vascular disease, unspecified: Secondary | ICD-10-CM

## 2022-08-27 DIAGNOSIS — I70239 Atherosclerosis of native arteries of right leg with ulceration of unspecified site: Secondary | ICD-10-CM

## 2022-08-27 NOTE — Progress Notes (Unsigned)
Office Note     CC:  follow up Requesting Provider:  Orpah Melter, MD  HPI: Thomas Ferrell is a 81 y.o. ([birth year]) male who presents for routine follow up of carotid artery stenosis and PAD. He has remote history of bilateral common femoral endarterectomies, right to left femoral-femoral bypass and left femoral to above knee popliteal bypass with Propaten on 12/09/13 by [name] for ischemic left leg. He has done well since his surgeries with minimal lower extremity symptoms. He does have a left great toe drop that he wears a brace for. This is secondary to a history of a spinal injury. 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 He walks with a cane or walker. Today he reports this is all unchanged. Denies any pain in his legs on ambulation or rest. Does occasionally get a cramp in his legs if he is up on them for a prolonged period of time. No tissue loss. He says he walks his driveway 7-8M/ day. This is about 3 cars in length.   He also has remote history of right CEA on 10/21/06 by [name] for symptomatic carotid stenosis. He has known 40-59% left ICA stenosis on carotid duplex. Today he denies changes in vision or monocular vision loss, slurred speech, facial drooping, unilateral upper or lower extremity weakness or numbness. He does say he has "TIA's" and goes on to describe very quick floaters that go across his vision once every couple months. He says when this happens he takes a 325 mg Asprin and he is fine. No other associated symptoms.   The pt is on a statin for cholesterol management.  The pt is on a daily aspirin.   Other AC:  none The pt is on CCB, ACE for hypertension.   The pt is not diabetic.   Tobacco hx:  former, 2007  Past Medical History:  Diagnosis Date   Abnormality of gait 03/03/2013   bladder ca dx'd 11/2009   chemo/xrt comp 02/2010   Cerebrovascular disease    right brain CVA 2007   COPD (chronic obstructive pulmonary disease) (HCC)    Cough  productive of clear sputum    TX RECENT SINUS INFECTION    CVA (cerebral vascular accident) ([place]) 2007   r-cva   Dyslipidemia    Hypertension    Lumbago     Past Surgical History:  Procedure Laterality Date   ABDOMINAL AORTAGRAM N/A 12/07/2013   Procedure: ABDOMINAL Maxcine Ham;  Surgeon: Angelia Mould, MD;  Location: [facility] CATH LAB;  Service: Cardiovascular;  Laterality: N/A;   BACK SURGERY  2010   bladder cancer     CAROTID ARTERY ANGIOPLASTY  2009   CHOLECYSTECTOMY N/A 06/06/2016   Procedure: LAPAROSCOPIC CHOLECYSTECTOMY WITH INTRAOPERATIVE CHOLANGIOGRAM;  Surgeon: Armandina Gemma, MD;  Location: [facility] ORS;  Service: General;  Laterality: N/A;   ERCP N/A 06/05/2016   Procedure: ENDOSCOPIC RETROGRADE CHOLANGIOPANCREATOGRAPHY (ERCP);  Surgeon: Teena Irani, MD;  Location: [facility];  Service: Endoscopy;  Laterality: N/A;   ESOPHAGOGASTRODUODENOSCOPY (EGD) WITH PROPOFOL N/A 06/05/2016   Procedure: ESOPHAGOGASTRODUODENOSCOPY (EGD);  Surgeon: Teena Irani, MD;  Location: [facility] ENDOSCOPY;  Service: Endoscopy;  Laterality: N/A;   FEMORAL-FEMORAL BYPASS GRAFT N/A 12/09/2013   Procedure: BYPASS GRAFT FEMORAL-FEMORAL ARTERY WITH BILATERAL ENDARTERECTOMY ;  Surgeon: Elam Dutch, MD;  Location: [facility] OR;  Service: Vascular;  Laterality: N/A;   FEMORAL-POPLITEAL BYPASS GRAFT Left 12/09/2013   Procedure: BYPASS GRAFT FEMORAL-POPLITEAL ARTERY - LEFT ;  Surgeon: Elam Dutch, MD;  Location: [facility] OR;  Service: Vascular;  Laterality: Left;   LAMINECTOMY  09/08/2013   L 2 L3 L4 L5       Dr Luiz Ochoa    Social History   Socioeconomic History   Marital status: Married    Spouse name: Not on file   Number of children: 2   Years of education: College   Highest education level: Not on file  Occupational History    Employer: RETIRED    Comment: Retired  Tobacco Use   Smoking status: Former    Types: Cigarettes    Quit date: 11/12/2005    Years since quitting: 16.8    Passive exposure: Never   Smokeless  tobacco: Never  Vaping Use   Vaping Use: Never used  Substance and Sexual Activity   Alcohol use: No   Drug use: No   Sexual activity: Not on file  Other Topics Concern   Not on file  Social History Narrative   Not on file   Social Determinants of Health   Financial Resource Strain: Not on file  Food Insecurity: Not on file  Transportation Needs: Not on file  Physical Activity: Not on file  Stress: Not on file  Social Connections: Not on file  Intimate Partner Violence: Not on file    Family History  Problem Relation Age of Onset   Stroke Mother    Cancer Father    Dementia Father    Cancer Sister     Current Outpatient Medications  Medication Sig Dispense Refill   acetaminophen (TYLENOL) 500 MG tablet Take 1,000 mg by mouth every 6 (six) hours as needed for mild pain or headache.      amLODipine (NORVASC) 10 MG tablet Take 10 mg by mouth daily.     aspirin EC 325 MG tablet Take 325 mg by mouth daily.     atorvastatin (LIPITOR) 20 MG tablet Take 20 mg by mouth daily.     cholecalciferol (VITAMIN D) 1000 units tablet Take 1,000 Units by mouth daily.     feeding supplement, ENSURE COMPLETE, (ENSURE COMPLETE) LIQD Take 237 mLs by mouth 3 (three) times daily with meals.     ferrous sulfate 325 (65 FE) MG EC tablet Take 325 mg by mouth daily with breakfast.     lisinopril (PRINIVIL,ZESTRIL) 40 MG tablet Take 40 mg by mouth daily.  5   sertraline (ZOLOFT) 100 MG tablet Take 100 mg by mouth daily.     No current facility-administered medications for this visit.    Allergies  Allergen Reactions   Morphine And Related Itching     REVIEW OF SYSTEMS:  '[X]'$  denotes positive finding, '[ ]'$  denotes negative finding Cardiac  Comments:  Chest pain or chest pressure:    Shortness of breath upon exertion:    Short of breath when lying flat:    Irregular heart rhythm:        Vascular    Pain in calf, thigh, or hip brought on by ambulation:    Pain in feet at night that wakes  you up from your sleep:     Blood clot in your veins:    Leg swelling:         Pulmonary    Oxygen at home:    Productive cough:     Wheezing:         Neurologic    Sudden weakness in arms or legs:     Sudden numbness in arms or legs:  Sudden onset of difficulty speaking or slurred speech:    Temporary loss of vision in one eye:     Problems with dizziness:         Gastrointestinal    Blood in stool:     Vomited blood:         Genitourinary    Burning when urinating:     Blood in urine:        Psychiatric    Major depression:         Hematologic    Bleeding problems:    Problems with blood clotting too easily:        Skin    Rashes or ulcers:        Constitutional    Fever or chills:      PHYSICAL EXAMINATION:  Vitals:   08/28/22 1335  Resp: 20  Weight: 117 lb 6.4 oz (53.3 kg)  Height: '5\' 4"'$  (1.626 m)    General:  WDWN in NAD; vital signs documented above Gait: ambulates with rolling walker HENT: WNL, normocephalic Pulmonary: normal non-labored breathing without wheezing Cardiac: regular HR, without  Murmurs with carotid bruit on right Vascular Exam/Pulses:  Right Left  Radial 2+ (normal) 2+ (normal)  Femoral 2+ (normal) 2+ (normal)  Popliteal Not palpable Not palpable  DP 1+ (weak) 1+ (weak)  PT absent absent   Extremities: without ischemic changes, without Gangrene , without cellulitis; without open wounds;  Musculoskeletal: no muscle wasting or atrophy  Neurologic: A&O X 3;  No focal weakness or paresthesias are detected Psychiatric:  The pt has Normal affect.   Non-Invasive Vascular Imaging:   VAS US Carotid Duplex: 08/27/22 Summary:  Right Carotid: Velocities in the right ICA are consistent with a 1-39% stenosis. Non-hemodynamically significant plaque <50% noted in the CCA.   Left Carotid: Velocities in the left ICA are consistent with a 1-39% stenosis. Non-hemodynamically significant plaque <50% noted in the CCA. The ECA appears <50%  stenosed.   Vertebrals:  Bilateral vertebral arteries demonstrate antegrade flow. Left vertebral artery demonstrates high resistant flow.  Subclavians: Right subclavian artery was stenotic. Normal flow hemodynamics were seen in the left subclavian artery.   +-------+-----------+-----------+------------+------------+  ABI/TBIToday's ABIToday's TBIPrevious ABIPrevious TBI  +-------+-----------+-----------+------------+------------+  Right  0.73       0.74       0.77        0.54          +-------+-----------+-----------+------------+------------+  Left   0.92       0.81       0.83        0.58          +-------+-----------+-----------+------------+------------+   VAS [place] Fem- Fem bypass graft: Fem Fem Graft: Right to Left  +-----------------+--------+--------------+----------+---------------------  ----+                   PSV cm/sStenosis      Waveform  Comments                    +-----------------+--------+--------------+----------+---------------------  ----+  Inflow           283     50-74%        monophasic  stenosis                                            +-----------------+--------+--------------+----------+---------------------  ----+  Prox anastomosis 103                   triphasic Bidirectional and                                                             turbulent                    +-----------------+--------+--------------+----------+---------------------  ----+  Proximal graft   76                    triphasic                             +-----------------+--------+--------------+----------+---------------------  ----+  Mid graft        55                    triphasic                             +-----------------+--------+--------------+----------+---------------------  ----+  Distal graft     45                    monophasic                             +-----------------+--------+--------------+----------+---------------------  ----+  Distal           60                    monophasicBidirectional and            anastomosis                                      turbulent                    +-----------------+--------+--------------+----------+---------------------  ----+  Outflow          119     50-74%        triphasic Bidirectional and                                     stenosis                turbulent                    +-----------------+--------+--------------+----------+---------------------  ----+     Right to Left Fem - Fem artery bypass is patent. There is a 50-74% stenosis proximal to the graft inflow anastamosis in the RT CFA based on PSV. There is a 50-74% stenosis in the LT outflow artery based on PSV ratio >2.0.    VAS [place] Lower extremity Bypass graft duplex: Left Graft #  1: LT Femoral - Popliteal  +--------------------+--------+--------+---------+--------+                      PSV cm/sStenosisWaveform Comments  +--------------------+--------+--------+---------+--------+  Inflow              24              triphasic          +--------------------+--------+--------+---------+--------+  Proximal Anastomosis46              triphasic          +--------------------+--------+--------+---------+--------+  Proximal Graft      65              biphasic           +--------------------+--------+--------+---------+--------+  Mid Graft           51              biphasic           +--------------------+--------+--------+---------+--------+  Distal Graft        41              biphasic           +--------------------+--------+--------+---------+--------+  Distal Anastomosis  73              biphasic           +--------------------+--------+--------+---------+--------+  Outflow             112             biphasic            +--------------------+--------+--------+---------+--------+    ASSESSMENT/PLAN:: 81 y.o. male here for follow up for PAD and carotid artery stenosis. He presently has no claudication, rest pain or tissue loss. He also has no symptoms associated with his carotid artery stenosis. He does report these visual floaters that happen time to time. I reviewed signs and symptoms of TIA/ Stroke and he understands that he needs to seek immediate medical attention - Carotid duplex today shows 1-39% ICA stenosis bilaterally - Fem-Fem duplex shows some decreased velocities with some 50-74% stenosis at the inflow and outflow. Otherwise patent. The left fem- above knee popliteal bypass is patent without any stenosis. Triphasic and biphasic flow throughout - ABIs are stable on right and improved on left  - Continue  Aspirin and statin - He will need repeat carotid duplex in 1 year - I will have him return in 6 months with ABI and Fem- Fem duplex - He understands should he have any new or concerning symptoms to call for earlier follow up   Karoline Caldwell, PA-C [facility] [phone]  Clinic MD:   Carlis Abbott

## 2022-08-28 ENCOUNTER — Ambulatory Visit: Payer: Medicare Other | Admitting: Physician Assistant

## 2022-08-28 VITALS — Resp 20 | Ht 64.0 in | Wt 117.4 lb

## 2022-08-28 DIAGNOSIS — Z95828 Presence of other vascular implants and grafts: Secondary | ICD-10-CM | POA: Diagnosis not present

## 2022-08-28 DIAGNOSIS — I739 Peripheral vascular disease, unspecified: Secondary | ICD-10-CM | POA: Diagnosis not present

## 2022-08-28 DIAGNOSIS — Z9889 Other specified postprocedural states: Secondary | ICD-10-CM | POA: Diagnosis not present

## 2022-08-30 ENCOUNTER — Other Ambulatory Visit: Payer: Self-pay

## 2022-08-30 DIAGNOSIS — I739 Peripheral vascular disease, unspecified: Secondary | ICD-10-CM

## 2022-08-30 DIAGNOSIS — Z95828 Presence of other vascular implants and grafts: Secondary | ICD-10-CM

## 2022-08-30 DIAGNOSIS — Z9889 Other specified postprocedural states: Secondary | ICD-10-CM

## 2022-11-07 DIAGNOSIS — I10 Essential (primary) hypertension: Secondary | ICD-10-CM | POA: Diagnosis not present

## 2022-11-07 DIAGNOSIS — E785 Hyperlipidemia, unspecified: Secondary | ICD-10-CM | POA: Diagnosis not present

## 2022-11-07 DIAGNOSIS — I739 Peripheral vascular disease, unspecified: Secondary | ICD-10-CM | POA: Diagnosis not present

## 2022-11-07 DIAGNOSIS — J449 Chronic obstructive pulmonary disease, unspecified: Secondary | ICD-10-CM | POA: Diagnosis not present

## 2023-02-20 ENCOUNTER — Emergency Department (HOSPITAL_BASED_OUTPATIENT_CLINIC_OR_DEPARTMENT_OTHER): Payer: Medicare Other | Admitting: Radiology

## 2023-02-20 ENCOUNTER — Other Ambulatory Visit: Payer: Self-pay

## 2023-02-20 ENCOUNTER — Emergency Department (HOSPITAL_BASED_OUTPATIENT_CLINIC_OR_DEPARTMENT_OTHER)
Admission: EM | Admit: 2023-02-20 | Discharge: 2023-02-20 | Disposition: A | Discharge status: Home / Self Care | Payer: Medicare Other | Attending: Emergency Medicine | Admitting: Emergency Medicine

## 2023-02-20 DIAGNOSIS — L03113 Cellulitis of right upper limb: Secondary | ICD-10-CM | POA: Insufficient documentation

## 2023-02-20 DIAGNOSIS — Z7982 Long term (current) use of aspirin: Secondary | ICD-10-CM | POA: Insufficient documentation

## 2023-02-20 DIAGNOSIS — R2231 Localized swelling, mass and lump, right upper limb: Secondary | ICD-10-CM | POA: Diagnosis present

## 2023-02-20 MED ORDER — DOXYCYCLINE HYCLATE 100 MG PO CAPS: 100.0000 mg | ORAL_CAPSULE | Freq: Two times a day (BID) | ORAL | 0 refills | Status: DC

## 2023-02-20 NOTE — ED Triage Notes (Signed)
Patient reports increased right hand pain/swelling x2 days. No falls or injuries. Patient states that he may have been "bitten by something". Rates pain a 5/10

## 2023-02-20 NOTE — Discharge Instructions (Signed)
Take antibiotics as active for 7 days.  See a clinician or return to the emergency room if no improvement or worsening signs or symptoms.  Use Tylenol every 4 hours any for pain.

## 2023-02-20 NOTE — ED Notes (Signed)
Patient given discharge instructions. Questions were answered. Patient verbalized understanding of discharge instructions and care at home. ? ?Discharged with family ?

## 2023-02-20 NOTE — ED Provider Notes (Signed)
[place] EMERGENCY DEPARTMENT AT [facility] Provider Note   CSN: [id number] Arrival date & time: 02/20/23  2595     History  Chief Complaint  Patient presents with   Hand Problem    Right     Thomas Ferrell is a 82 y.o. male.  Patient presents with increased right hand swelling pain and redness for 2 days.  No falls or injuries.  Possibly was bitten.  Pain 5 out of 10.  No fevers or chills but mild nausea.       Home Medications Prior to Admission medications   Medication Sig Start Date End Date Taking? Authorizing Provider  doxycycline (VIBRAMYCIN) 100 MG capsule Take 1 capsule (100 mg total) by mouth 2 (two) times daily. One po bid x 7 days 02/20/23  Yes Blane Ohara, MD  acetaminophen (TYLENOL) 500 MG tablet Take 1,000 mg by mouth every 6 (six) hours as needed for mild pain or headache.     [provider]  amLODipine (NORVASC) 10 MG tablet Take 10 mg by mouth daily.    [provider]  aspirin EC 325 MG tablet Take 325 mg by mouth daily.    [provider]  atorvastatin (LIPITOR) 20 MG tablet Take 20 mg by mouth daily. 07/17/22   [provider]  cholecalciferol (VITAMIN D) 1000 units tablet Take 1,000 Units by mouth daily.    [provider]  feeding supplement, ENSURE COMPLETE, (ENSURE COMPLETE) LIQD Take 237 mLs by mouth 3 (three) times daily with meals. 12/11/13   Rhyne, Ames Coupe, PA-C  ferrous sulfate 325 (65 FE) MG EC tablet Take 325 mg by mouth daily with breakfast.    [provider]  lisinopril (PRINIVIL,ZESTRIL) 40 MG tablet Take 40 mg by mouth daily.    [provider]  sertraline (ZOLOFT) 100 MG tablet Take 100 mg by mouth daily.    [provider]      Allergies    Morphine and related    Review of Systems   Review of Systems  Constitutional:  Negative for chills and fever.  HENT:  Negative for congestion.   Eyes:  Negative for visual disturbance.  Respiratory:   Negative for shortness of breath.   Cardiovascular:  Negative for chest pain.  Gastrointestinal:  Negative for abdominal pain and vomiting.  Genitourinary:  Negative for dysuria and flank pain.  Musculoskeletal:  Negative for back pain, neck pain and neck stiffness.  Skin:  Positive for rash.  Neurological:  Negative for light-headedness and headaches.    Physical Exam Updated Vital Signs BP (!) 151/51 (BP Location: Left Arm)   Pulse 72   Temp 97.8 F (36.6 C) (Oral)   Resp 20   Ht 5\' 4"  (1.626 m)   Wt 54 kg   SpO2 100%   BMI 20.43 kg/m  Physical Exam Vitals and nursing note reviewed.  Constitutional:      General: He is not in acute distress.    Appearance: He is well-developed.  HENT:     Head: Normocephalic and atraumatic.     Mouth/Throat:     Mouth: Mucous membranes are moist.  Eyes:     General:        Right eye: No discharge.        Left eye: No discharge.     Conjunctiva/sclera: Conjunctivae normal.  Neck:     Trachea: No tracheal deviation.  Cardiovascular:     Rate and Rhythm: Normal rate.  Pulmonary:  Effort: Pulmonary effort is normal.  Abdominal:     General: There is no distension.  Musculoskeletal:     Cervical back: Normal range of motion and neck supple. No rigidity.  Skin:    General: Skin is warm.     Capillary Refill: Capillary refill takes less than 2 seconds.     Findings: Rash present.     Comments: Patient has erythema mild swelling and tenderness with focal abrasion upper dorsal hand proximal to metacarpals.  No focal joint involvement more diffuse.  Neurological:     General: No focal deficit present.     Mental Status: He is alert.  Psychiatric:        Mood and Affect: Mood normal.     ED Results / Procedures / Treatments   Labs (all labs ordered are listed, but only abnormal results are displayed) Labs Reviewed - No data to display  EKG None  Radiology No results found.  Procedures Procedures    Medications  Ordered in ED Medications - No data to display  ED Course/ Medical Decision Making/ A&P                             Medical Decision Making Risk Prescription drug management.   Patient presents with clinical concern for cellulitis secondary to abrasion versus insect bite.  No signs of abscess.  Patient has no systemic signs of sepsis signs.  Patient well-hydrated no indication for IV fluids or blood work.  Plan for oral antibiotics and recheck.  Patient comfortable plan.        Final Clinical Impression(s) / ED Diagnoses Final diagnoses:  Cellulitis of right hand    Rx / DC Orders ED Discharge Orders          Ordered    doxycycline (VIBRAMYCIN) 100 MG capsule  2 times daily        02/20/23 1039              Blane Ohara, MD 02/20/23 1040

## 2023-05-09 DIAGNOSIS — J449 Chronic obstructive pulmonary disease, unspecified: Secondary | ICD-10-CM | POA: Diagnosis not present

## 2023-05-09 DIAGNOSIS — Z9989 Dependence on other enabling machines and devices: Secondary | ICD-10-CM | POA: Diagnosis not present

## 2023-05-09 DIAGNOSIS — Z1211 Encounter for screening for malignant neoplasm of colon: Secondary | ICD-10-CM | POA: Diagnosis not present

## 2023-05-09 DIAGNOSIS — Z0000 Encounter for general adult medical examination without abnormal findings: Secondary | ICD-10-CM | POA: Diagnosis not present

## 2023-05-09 DIAGNOSIS — R262 Difficulty in walking, not elsewhere classified: Secondary | ICD-10-CM | POA: Diagnosis not present

## 2023-05-09 DIAGNOSIS — I739 Peripheral vascular disease, unspecified: Secondary | ICD-10-CM | POA: Diagnosis not present

## 2023-06-17 ENCOUNTER — Ambulatory Visit (INDEPENDENT_AMBULATORY_CARE_PROVIDER_SITE_OTHER)
Admission: RE | Admit: 2023-06-17 | Discharge: 2023-06-17 | Disposition: A | Discharge status: Home / Self Care | Payer: Medicare Other | Source: Ambulatory Visit | Attending: Surgery | Admitting: Surgery

## 2023-06-17 ENCOUNTER — Ambulatory Visit: Payer: Medicare Other | Admitting: Physician Assistant

## 2023-06-17 ENCOUNTER — Ambulatory Visit (INDEPENDENT_AMBULATORY_CARE_PROVIDER_SITE_OTHER): Admission: RE | Admit: 2023-06-17 | Payer: Medicare Other | Source: Ambulatory Visit

## 2023-06-17 ENCOUNTER — Ambulatory Visit (HOSPITAL_COMMUNITY)
Admission: RE | Admit: 2023-06-17 | Discharge: 2023-06-17 | Disposition: A | Discharge status: Home / Self Care | Payer: Medicare Other | Source: Ambulatory Visit | Attending: Surgery | Admitting: Surgery

## 2023-06-17 VITALS — BP 175/74 | HR 83 | Temp 98.7°F | Resp 18 | Ht 64.0 in | Wt 118.0 lb

## 2023-06-17 DIAGNOSIS — I739 Peripheral vascular disease, unspecified: Secondary | ICD-10-CM

## 2023-06-17 DIAGNOSIS — Z95828 Presence of other vascular implants and grafts: Secondary | ICD-10-CM | POA: Diagnosis not present

## 2023-06-17 DIAGNOSIS — T82858A Stenosis of vascular prosthetic devices, implants and grafts, initial encounter: Secondary | ICD-10-CM

## 2023-06-17 LAB — VAS US ABI WITH/WO TBI
Left ABI: 0.8
Right ABI: 0.73

## 2023-06-17 NOTE — Progress Notes (Signed)
Office Note     CC:  follow up Requesting Provider:  Joycelyn Rua, MD  HPI: Thomas Ferrell is a 82 y.o. (01-May-1941) male who presents for follow up of PAD and carotid artery stenosis. He has remote history of bilateral common femoral endarterectomies, right to left femoral-femoral bypass and left femoral to above knee popliteal bypass with Propaten on 12/09/13 by Dr. Darrick Penna for ischemic left leg. He has done well since his surgeries with minimal lower extremity symptoms. He does have a left great toe drop that he wears a brace for. This is secondary to a history of a spinal injury.   Today he is here with his wife. He reports overall doing very well. 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 He ambulates with a rolling walker and says he really does not walk much on account of his neuropathy. He only really has pain when standing otherwise he does not have any pain at rest. No tissue loss.  He also has remote history of right CEA on 10/21/06 by Dr. Darrick Penna for symptomatic carotid stenosis. Today he denies changes in vision, slurred speech, facial drooping, unilateral upper or lower extremity weakness or numbness.    The pt is on a statin for cholesterol management.  The pt is on a daily aspirin.   Other AC:  none The pt is on CCB, ACE for hypertension.   The pt is not diabetic.   Tobacco hx:  former, 2007  Past Medical History:  Diagnosis Date   Abnormality of gait 03/03/2013   bladder ca dx'd 11/2009   chemo/xrt comp 02/2010   Cerebrovascular disease    right brain CVA 2007   COPD (chronic obstructive pulmonary disease) (HCC)    Cough productive of clear sputum    TX RECENT SINUS INFECTION    CVA (cerebral vascular accident) (HCC) 2007   r-cva   Dyslipidemia    Hypertension    Lumbago     Past Surgical History:  Procedure Laterality Date   ABDOMINAL AORTAGRAM N/A 12/07/2013   Procedure: ABDOMINAL Ronny Flurry;   Surgeon: Chuck Hint, MD;  Location: [facility] CATH LAB;  Service: Cardiovascular;  Laterality: N/A;   BACK SURGERY  2010   bladder cancer     CAROTID ARTERY ANGIOPLASTY  2009   CHOLECYSTECTOMY N/A 06/06/2016   Procedure: LAPAROSCOPIC CHOLECYSTECTOMY WITH INTRAOPERATIVE CHOLANGIOGRAM;  Surgeon: Darnell Level, MD;  Location: [facility] ORS;  Service: General;  Laterality: N/A;   ERCP N/A 06/05/2016   Procedure: ENDOSCOPIC RETROGRADE CHOLANGIOPANCREATOGRAPHY (ERCP);  Surgeon: Dorena Cookey, MD;  Location: [facility]COPY;  Service: Endoscopy;  Laterality: N/A;   ESOPHAGOGASTRODUODENOSCOPY (EGD) WITH PROPOFOL N/A 06/05/2016   Procedure: ESOPHAGOGASTRODUODENOSCOPY (EGD);  Surgeon: Dorena Cookey, MD;  Location: [facility] ENDOSCOPY;  Service: Endoscopy;  Laterality: N/A;   FEMORAL-FEMORAL BYPASS GRAFT N/A 12/09/2013   Procedure: BYPASS GRAFT FEMORAL-FEMORAL ARTERY WITH BILATERAL ENDARTERECTOMY ;  Surgeon: Sherren Kerns, MD;  Location: [facility] OR;  Service: Vascular;  Laterality: N/A;   FEMORAL-POPLITEAL BYPASS GRAFT Left 12/09/2013   Procedure: BYPASS GRAFT FEMORAL-POPLITEAL ARTERY - LEFT ;  Surgeon: Sherren Kerns, MD;  Location: [facility] OR;  Service: Vascular;  Laterality: Left;   LAMINECTOMY  09/08/2013   L 2 L3 L4 L5       Dr Phoebe Perch    Social History   Socioeconomic History   Marital status: Married    Spouse name: Not on file   Number of children:  2   Years of education: College   Highest education level: Not on file  Occupational History    Employer: RETIRED    Comment: Retired  Tobacco Use   Smoking status: Former    Current packs/day: 0.00    Types: Cigarettes    Quit date: 11/12/2005    Years since quitting: 17.6    Passive exposure: Never   Smokeless tobacco: Never  Vaping Use   Vaping status: Never Used  Substance and Sexual Activity   Alcohol use: No   Drug use: No   Sexual activity: Not on file  Other Topics Concern   Not on file  Social History Narrative   Not on file   Social Determinants of  Health   Financial Resource Strain: Not on file  Food Insecurity: Not on file  Transportation Needs: Not on file  Physical Activity: Not on file  Stress: Not on file  Social Connections: Not on file  Intimate Partner Violence: Not on file    Family History  Problem Relation Age of Onset   Stroke Mother    Cancer Father    Dementia Father    Cancer Sister     Current Outpatient Medications  Medication Sig Dispense Refill   acetaminophen (TYLENOL) 500 MG tablet Take 1,000 mg by mouth every 6 (six) hours as needed for mild pain or headache.      amLODipine (NORVASC) 10 MG tablet Take 10 mg by mouth daily.     aspirin EC 325 MG tablet Take 325 mg by mouth daily.     atorvastatin (LIPITOR) 20 MG tablet Take 20 mg by mouth daily.     cholecalciferol (VITAMIN D) 1000 units tablet Take 1,000 Units by mouth daily.     doxycycline (VIBRAMYCIN) 100 MG capsule Take 1 capsule (100 mg total) by mouth 2 (two) times daily. One po bid x 7 days 14 capsule 0   feeding supplement, ENSURE COMPLETE, (ENSURE COMPLETE) LIQD Take 237 mLs by mouth 3 (three) times daily with meals.     ferrous sulfate 325 (65 FE) MG EC tablet Take 325 mg by mouth daily with breakfast.     lisinopril (PRINIVIL,ZESTRIL) 40 MG tablet Take 40 mg by mouth daily.  5   sertraline (ZOLOFT) 100 MG tablet Take 100 mg by mouth daily.     No current facility-administered medications for this visit.    Allergies  Allergen Reactions   Morphine And Codeine Itching     REVIEW OF SYSTEMS:  [X]  denotes positive finding, [ ]  denotes negative finding Cardiac  Comments:  Chest pain or chest pressure:    Shortness of breath upon exertion:    Short of breath when lying flat:    Irregular heart rhythm:        Vascular    Pain in calf, thigh, or hip brought on by ambulation:    Pain in feet at night that wakes you up from your sleep:     Blood clot in your veins:    Leg swelling:         Pulmonary    Oxygen at home:     Productive cough:     Wheezing:         Neurologic    Sudden weakness in arms or legs:     Sudden numbness in arms or legs:     Sudden onset of difficulty speaking or slurred speech:    Temporary loss of vision in one eye:  Problems with dizziness:         Gastrointestinal    Blood in stool:     Vomited blood:         Genitourinary    Burning when urinating:     Blood in urine:        Psychiatric    Major depression:         Hematologic    Bleeding problems:    Problems with blood clotting too easily:        Skin    Rashes or ulcers:        Constitutional    Fever or chills:      PHYSICAL EXAMINATION:  Vitals:   06/17/23 1400  BP: (!) 175/74  Pulse: 83  Resp: 18  Temp: 98.7 F (37.1 C)  TempSrc: Temporal  SpO2: 95%  Weight: 118 lb (53.5 kg)  Height: 5\' 4"  (1.626 m)    General:  WDWN in NAD; vital signs documented above Gait: Ambulates with walker HENT: WNL, normocephalic Pulmonary: normal non-labored breathing  Cardiac: regular HR Abdomen: soft, NT Vascular Exam/Pulses: 2+ femoral pulse, palpable pulse in fem- fem bypass. Brisk doppler DP/PT signals bilaterally Extremities: without ischemic changes, without Gangrene , without cellulitis; without open wounds; multiple healed scars on LLE Musculoskeletal: no muscle wasting or atrophy  Neurologic: A&O X 3 Psychiatric:  The pt has  affect.   Non-Invasive Vascular Imaging:   Fem Fem Graft: Right to Left  +------------------+--------+---------------+----------+---------+                   PSV cm/sStenosis       Waveform  Comments   +------------------+--------+---------------+----------+---------+  Inflow           407     75-99% stenosistriphasic turbulent  +------------------+--------+---------------+----------+---------+  Prox anastomosis  74                     triphasic turbulent  +------------------+--------+---------------+----------+---------+  Proximal graft    69                      triphasic            +------------------+--------+---------------+----------+---------+  Mid graft         36                     triphasic            +------------------+--------+---------------+----------+---------+  Distal graft      46                     monophasic           +------------------+--------+---------------+----------+---------+  Distal anastomosis50                     monophasic           +------------------+--------+---------------+----------+---------+  Outflow          109                    triphasic turbulent  +------------------+--------+---------------+----------+---------+    Summary:  Right: 75-99% stenosis noted in the common femoral artery. Right-Left  Fem-fem bypass graft 75-99% inflow stenosis.   Left Graft #1: Left femoral-popliteal artery bypass graft  +--------------------+--------+---------------+----------+--------+                     PSV cm/sStenosis       Waveform  Comments  +--------------------+--------+---------------+----------+--------+  Inflow             59                     triphasic           +--------------------+--------+---------------+----------+--------+  Proximal Anastomosis54                     biphasic            +--------------------+--------+---------------+----------+--------+  Proximal Graft      49                     biphasic            +--------------------+--------+---------------+----------+--------+  Mid Graft           44                     biphasic            +--------------------+--------+---------------+----------+--------+  Distal Graft        42                     biphasic            +--------------------+--------+---------------+----------+--------+  Distal Anastomosis  57                     monophasic          +--------------------+--------+---------------+----------+--------+  Outflow            117     50-74%  stenosismonophasic          +--------------------+--------+---------------+----------+--------+    Summary:  Left: Left femoral popliteal artery bypass graft 50-74% stenosis based on  PSV ration >2.0 with distal blunted waveform.    ASSESSMENT/PLAN:: 82 y.o. male here for follow up for PAD and carotid artery disease. He has remote history of bilateral common femoral endarterectomies, right to left femoral-femoral bypass and left femoral to above knee popliteal bypass with Propaten on 12/09/13 by Dr. Darrick Penna for ischemic left leg. He currently is without any claudication, rest pain or tissue loss. However he does not really ambulate enough to elicit any claudication symptoms.  -The patient is on best medical therapy for peripheral arterial disease. He will continue his Aspirin and Statin -His duplex today shows increased velocities within the right to left fem- fem bypass graft at the inflow anastomosis. Velocities on last duplex were 283 cm/s and now they are 407 cm/s.   -Will his current elevated velocities and also some lower velocities in the mid graft with biphasic/monophasic flow I am worried that the bypass is threatened and I have therefore recommended angiography. Based on the patient's clinical exam and non-invasive data, we anticipate an endovascular intervention on the right femoral to left femoral bypass graft - He is a prior Dr. Darrick Penna patient so will arrange Aortogram in the near future with next available vascular surgeon  Graceann Congress, PA-C [facility] [phone]  Clinic MD:   Myra Gianotti

## 2023-06-19 ENCOUNTER — Telehealth: Payer: Self-pay

## 2023-06-19 NOTE — Telephone Encounter (Signed)
Attempted to reach patient to schedule aortogram. Left VM to return call.

## 2023-06-20 ENCOUNTER — Other Ambulatory Visit: Payer: Self-pay

## 2023-06-20 DIAGNOSIS — I739 Peripheral vascular disease, unspecified: Secondary | ICD-10-CM

## 2023-06-20 DIAGNOSIS — T82858A Stenosis of vascular prosthetic devices, implants and grafts, initial encounter: Secondary | ICD-10-CM

## 2023-06-26 ENCOUNTER — Ambulatory Visit (HOSPITAL_COMMUNITY)
Admission: RE | Admit: 2023-06-26 | Discharge: 2023-06-26 | Disposition: A | Discharge status: Home / Self Care | Payer: Medicare Other | Attending: Vascular Surgery | Admitting: Vascular Surgery

## 2023-06-26 ENCOUNTER — Encounter (HOSPITAL_COMMUNITY)
Admission: RE | Disposition: A | Discharge status: Home / Self Care | Payer: Self-pay | Source: Home / Self Care | Attending: Vascular Surgery

## 2023-06-26 ENCOUNTER — Other Ambulatory Visit: Payer: Self-pay

## 2023-06-26 DIAGNOSIS — I739 Peripheral vascular disease, unspecified: Secondary | ICD-10-CM | POA: Diagnosis not present

## 2023-06-26 DIAGNOSIS — Z87891 Personal history of nicotine dependence: Secondary | ICD-10-CM | POA: Insufficient documentation

## 2023-06-26 DIAGNOSIS — T82858A Stenosis of vascular prosthetic devices, implants and grafts, initial encounter: Secondary | ICD-10-CM | POA: Diagnosis not present

## 2023-06-26 DIAGNOSIS — Z7982 Long term (current) use of aspirin: Secondary | ICD-10-CM | POA: Diagnosis not present

## 2023-06-26 DIAGNOSIS — I10 Essential (primary) hypertension: Secondary | ICD-10-CM | POA: Insufficient documentation

## 2023-06-26 DIAGNOSIS — T82898A Other specified complication of vascular prosthetic devices, implants and grafts, initial encounter: Secondary | ICD-10-CM | POA: Diagnosis not present

## 2023-06-26 DIAGNOSIS — G629 Polyneuropathy, unspecified: Secondary | ICD-10-CM | POA: Insufficient documentation

## 2023-06-26 DIAGNOSIS — Z79899 Other long term (current) drug therapy: Secondary | ICD-10-CM | POA: Insufficient documentation

## 2023-06-26 DIAGNOSIS — Y832 Surgical operation with anastomosis, bypass or graft as the cause of abnormal reaction of the patient, or of later complication, without mention of misadventure at the time of the procedure: Secondary | ICD-10-CM | POA: Insufficient documentation

## 2023-06-26 DIAGNOSIS — I6529 Occlusion and stenosis of unspecified carotid artery: Secondary | ICD-10-CM | POA: Insufficient documentation

## 2023-06-26 HISTORY — PX: ABDOMINAL AORTOGRAM W/LOWER EXTREMITY: CATH118223

## 2023-06-26 LAB — POCT I-STAT, CHEM 8
BUN: 11 mg/dL (ref 8–23)
Calcium, Ion: 1.23 mmol/L (ref 1.15–1.40)
Chloride: 102 mmol/L (ref 98–111)
Creatinine, Ser: 0.8 mg/dL (ref 0.61–1.24)
Glucose, Bld: 97 mg/dL (ref 70–99)
HCT: 35 % — ABNORMAL LOW (ref 39.0–52.0)
Hemoglobin: 11.9 g/dL — ABNORMAL LOW (ref 13.0–17.0)
Potassium: 3.8 mmol/L (ref 3.5–5.1)
Sodium: 139 mmol/L (ref 135–145)
TCO2: 24 mmol/L (ref 22–32)

## 2023-06-26 SURGERY — ABDOMINAL AORTOGRAM W/LOWER EXTREMITY
Anesthesia: LOCAL

## 2023-06-26 MED ORDER — FENTANYL CITRATE (PF) 100 MCG/2ML IJ SOLN
INTRAMUSCULAR | Status: DC | PRN
  Administered 2023-06-26 (×2): 25 ug via INTRAVENOUS

## 2023-06-26 MED ORDER — LABETALOL HCL 5 MG/ML IV SOLN: 10.0000 mg | INTRAVENOUS | Status: DC | PRN

## 2023-06-26 MED ORDER — FENTANYL CITRATE (PF) 100 MCG/2ML IJ SOLN
INTRAMUSCULAR | Status: AC
  Filled 2023-06-26: qty 2

## 2023-06-26 MED ORDER — HEPARIN SODIUM (PORCINE) 1000 UNIT/ML IJ SOLN
INTRAMUSCULAR | Status: AC
  Filled 2023-06-26: qty 10

## 2023-06-26 MED ORDER — LIDOCAINE HCL (PF) 1 % IJ SOLN
INTRAMUSCULAR | Status: AC
  Filled 2023-06-26: qty 30

## 2023-06-26 MED ORDER — IODIXANOL 320 MG/ML IV SOLN
INTRAVENOUS | Status: DC | PRN
  Administered 2023-06-26: 120 mL

## 2023-06-26 MED ORDER — MIDAZOLAM HCL 2 MG/2ML IJ SOLN
INTRAMUSCULAR | Status: AC
  Filled 2023-06-26: qty 2

## 2023-06-26 MED ORDER — HEPARIN (PORCINE) IN NACL 1000-0.9 UT/500ML-% IV SOLN
INTRAVENOUS | Status: DC | PRN
  Administered 2023-06-26 (×2): 500 mL

## 2023-06-26 MED ORDER — ASPIRIN 81 MG PO TBEC: 81.0000 mg | DELAYED_RELEASE_TABLET | Freq: Every day | ORAL | Status: DC

## 2023-06-26 MED ORDER — HEPARIN SODIUM (PORCINE) 1000 UNIT/ML IJ SOLN
INTRAMUSCULAR | Status: DC | PRN
  Administered 2023-06-26: 5000 [IU] via INTRAVENOUS

## 2023-06-26 MED ORDER — ACETAMINOPHEN 325 MG PO TABS: 650.0000 mg | ORAL_TABLET | ORAL | Status: DC | PRN

## 2023-06-26 MED ORDER — ONDANSETRON HCL 4 MG/2ML IJ SOLN: 4.0000 mg | Freq: Four times a day (QID) | INTRAMUSCULAR | Status: DC | PRN

## 2023-06-26 MED ORDER — SODIUM CHLORIDE 0.9% FLUSH: 3.0000 mL | INTRAVENOUS | Status: DC | PRN

## 2023-06-26 MED ORDER — SODIUM CHLORIDE 0.9 % IV SOLN: INTRAVENOUS | Status: DC

## 2023-06-26 MED ORDER — LIDOCAINE HCL (PF) 1 % IJ SOLN
INTRAMUSCULAR | Status: DC | PRN
  Administered 2023-06-26: 12 mL

## 2023-06-26 MED ORDER — SODIUM CHLORIDE 0.9 % WEIGHT BASED INFUSION: 1.0000 mL/kg/h | INTRAVENOUS | Status: DC

## 2023-06-26 MED ORDER — HYDRALAZINE HCL 20 MG/ML IJ SOLN: 5.0000 mg | INTRAMUSCULAR | Status: DC | PRN

## 2023-06-26 MED ORDER — MIDAZOLAM HCL 2 MG/2ML IJ SOLN
INTRAMUSCULAR | Status: DC | PRN
  Administered 2023-06-26 (×2): .5 mg via INTRAVENOUS

## 2023-06-26 MED ORDER — SODIUM CHLORIDE 0.9% FLUSH: 3.0000 mL | Freq: Two times a day (BID) | INTRAVENOUS | Status: DC

## 2023-06-26 MED ORDER — SODIUM CHLORIDE 0.9 % IV SOLN: 250.0000 mL | INTRAVENOUS | Status: DC | PRN

## 2023-06-26 SURGICAL SUPPLY — 15 items
CATH ANGIO 5F BER2 65CM (CATHETERS) IMPLANT
CATH NAVICROSS ANG 65CM (CATHETERS) IMPLANT
CATH OMNI FLUSH 5F 65CM (CATHETERS) IMPLANT
CATH SOFT-VU 4F 65 STRAIGHT (CATHETERS) IMPLANT
CATH SOFT-VU STRAIGHT 4F 65CM (CATHETERS) ×1
CATHETER NAVICROSS ANG 65CM (CATHETERS) ×1
DEVICE CLOSURE MYNXGRIP 5F (Vascular Products) IMPLANT
GLIDEWIRE ADV .035X260CM (WIRE) IMPLANT
KIT MICROPUNCTURE NIT STIFF (SHEATH) IMPLANT
KIT PV (KITS) ×1 IMPLANT
SET ATX-X65L (MISCELLANEOUS) IMPLANT
SHEATH PINNACLE 5F 10CM (SHEATH) IMPLANT
SHEATH PROBE COVER 6X72 (BAG) IMPLANT
TRAY PV CATH (CUSTOM PROCEDURE TRAY) ×1 IMPLANT
WIRE BENTSON .035X145CM (WIRE) IMPLANT

## 2023-06-26 NOTE — Discharge Instructions (Signed)
Femoral Site Care This sheet gives you information about how to care for yourself after your procedure. Your health care provider may also give you more specific instructions. If you have problems or questions, contact your health care provider. What can I expect after the procedure?  After the procedure, it is common to have: Bruising that usually fades within 1-2 weeks. Tenderness at the site. Follow these instructions at home: Wound care Follow instructions from your health care provider about how to take care of your insertion site. Make sure you: Wash your hands with soap and water before you change your bandage (dressing). If soap and water are not available, use hand sanitizer. Remove your dressing as told by your health care provider. In 24 hours Do not take baths, swim, or use a hot tub until your health care provider approves. You may shower 24-48 hours after the procedure or as told by your health care provider. Gently wash the site with plain soap and water. Pat the area dry with a clean towel. Do not rub the site. This may cause bleeding. Do not apply powder or lotion to the site. Keep the site clean and dry. Check your femoral site every day for signs of infection. Check for: Redness, swelling, or pain. Fluid or blood. Warmth. Pus or a bad smell. Activity For the first 2-3 days after your procedure, or as long as directed: Avoid climbing stairs as much as possible. Do not squat. Do not lift anything that is heavier than 10 lb (4.5 kg), or the limit that you are told, until your health care provider says that it is safe. For 5 days Rest as directed. Avoid sitting for a long time without moving. Get up to take short walks every 1-2 hours. Do not drive for 24 hours if you were given a medicine to help you relax (sedative). General instructions Take over-the-counter and prescription medicines only as told by your health care provider. Keep all follow-up visits as told by  your health care provider. This is important. Contact a health care provider if you have: A fever or chills. You have redness, swelling, or pain around your insertion site. Get help right away if: The catheter insertion area swells very fast. You pass out. You suddenly start to sweat or your skin gets clammy. The catheter insertion area is bleeding, and the bleeding does not stop when you hold steady pressure on the area. The area near or just beyond the catheter insertion site becomes pale, cool, tingly, or numb. These symptoms may represent a serious problem that is an emergency. Do not wait to see if the symptoms will go away. Get medical help right away. Call your local emergency services (911 in the [place] Do not drive yourself to the hospital. Summary After the procedure, it is common to have bruising that usually fades within 1-2 weeks. Check your femoral site every day for signs of infection. Do not lift anything that is heavier than 10 lb (4.5 kg), or the limit that you are told, until your health care provider says that it is safe. This information is not intended to replace advice given to you by your health care provider. Make sure you discuss any questions you have with your health care provider. Document Revised: 11/11/2017 Document Reviewed: 11/11/2017 Elsevier Patient Education  2020 [employer] 

## 2023-06-26 NOTE — Progress Notes (Signed)
Patient walked to the bathroom without difficulties

## 2023-06-26 NOTE — Progress Notes (Signed)
Office Note   Patient seen and examined in preop holding.  No complaints. No changes to medication history or physical exam since last seen in clinic. After discussing the risks and benefits of angiogram for primary assisted patency of bypasses, Thomas Ferrell elected to proceed.   Victorino Sparrow MD   CC:  follow up Requesting Provider:  No ref. provider found  HPI: Thomas Ferrell is a 82 y.o. (1941-07-17) male who presents for follow up of PAD and carotid artery stenosis. He has remote history of bilateral common femoral endarterectomies, right to left femoral-femoral bypass and left femoral to above knee popliteal bypass with Propaten on 12/09/13 by Dr. Darrick Penna for ischemic left leg. He has done well since his surgeries with minimal lower extremity symptoms. He does have a left great toe drop that he wears a brace for. This is secondary to a history of a spinal injury.   Today he is here with his wife. He reports overall doing very well. 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 He ambulates with a rolling walker and says he really does not walk much on account of his neuropathy. He only really has pain when standing otherwise he does not have any pain at rest. No tissue loss.  He also has remote history of right CEA on 10/21/06 by Dr. Darrick Penna for symptomatic carotid stenosis. Today he denies changes in vision, slurred speech, facial drooping, unilateral upper or lower extremity weakness or numbness.    The pt is on a statin for cholesterol management.  The pt is on a daily aspirin.   Other AC:  none The pt is on CCB, ACE for hypertension.   The pt is not diabetic.   Tobacco hx:  former, 2007  Past Medical History:  Diagnosis Date   Abnormality of gait 03/03/2013   bladder ca dx'd 11/2009   chemo/xrt comp 02/2010   Cerebrovascular disease    right brain CVA 2007   COPD (chronic obstructive pulmonary disease) (HCC)     Cough productive of clear sputum    TX RECENT SINUS INFECTION    CVA (cerebral vascular accident) (HCC) 2007   r-cva   Dyslipidemia    Hypertension    Lumbago     Past Surgical History:  Procedure Laterality Date   ABDOMINAL AORTAGRAM N/A 12/07/2013   Procedure: ABDOMINAL Ronny Flurry;  Surgeon: Chuck Hint, MD;  Location: [facility] CATH LAB;  Service: Cardiovascular;  Laterality: N/A;   BACK SURGERY  2010   bladder cancer     CAROTID ARTERY ANGIOPLASTY  2009   CHOLECYSTECTOMY N/A 06/06/2016   Procedure: LAPAROSCOPIC CHOLECYSTECTOMY WITH INTRAOPERATIVE CHOLANGIOGRAM;  Surgeon: Darnell Level, MD;  Location: [facility] ORS;  Service: General;  Laterality: N/A;   ERCP N/A 06/05/2016   Procedure: ENDOSCOPIC RETROGRADE CHOLANGIOPANCREATOGRAPHY (ERCP);  Surgeon: Dorena Cookey, MD;  Location: [facility];  Service: Endoscopy;  Laterality: N/A;   ESOPHAGOGASTRODUODENOSCOPY (EGD) WITH PROPOFOL N/A 06/05/2016   Procedure: ESOPHAGOGASTRODUODENOSCOPY (EGD);  Surgeon: Dorena Cookey, MD;  Location: [facility];  Service: Endoscopy;  Laterality: N/A;   FEMORAL-FEMORAL BYPASS GRAFT N/A 12/09/2013   Procedure: BYPASS GRAFT FEMORAL-FEMORAL ARTERY WITH BILATERAL ENDARTERECTOMY ;  Surgeon: Sherren Kerns, MD;  Location: [facility] OR;  Service: Vascular;  Laterality: N/A;   FEMORAL-POPLITEAL BYPASS GRAFT Left 12/09/2013   Procedure: BYPASS GRAFT FEMORAL-POPLITEAL ARTERY - LEFT ;  Surgeon: Sherren Kerns, MD;  Location: [facility] OR;  Service: Vascular;  Laterality: Left;   LAMINECTOMY  09/08/2013   L 2 L3 L4 L5       Dr Phoebe Perch    Social History   Socioeconomic History   Marital status: Married    Spouse name: Not on file   Number of children: 2   Years of education: College   Highest education level: Not on file  Occupational History    Employer: RETIRED    Comment: Retired  Tobacco Use   Smoking status: Former    Current packs/day: 0.00    Types: Cigarettes    Quit date: 11/12/2005    Years since quitting: 17.6     Passive exposure: Never   Smokeless tobacco: Never  Vaping Use   Vaping status: Never Used  Substance and Sexual Activity   Alcohol use: No   Drug use: No   Sexual activity: Not on file  Other Topics Concern   Not on file  Social History Narrative   Not on file   Social Determinants of Health   Financial Resource Strain: Not on file  Food Insecurity: Not on file  Transportation Needs: Not on file  Physical Activity: Not on file  Stress: Not on file  Social Connections: Not on file  Intimate Partner Violence: Not on file    Family History  Problem Relation Age of Onset   Stroke Mother    Cancer Father    Dementia Father    Cancer Sister     Current Facility-Administered Medications  Medication Dose Route Frequency Provider Last Rate Last Admin   0.9 %  sodium chloride infusion   Intravenous Continuous Victorino Sparrow, MD 100 mL/hr at 06/26/23 0742 New Bag at 06/26/23 0742   Heparin (Porcine) in NaCl 1000-0.9 UT/500ML-% SOLN    PRN Victorino Sparrow, MD   500 mL at 06/26/23 0830    Allergies  Allergen Reactions   Hydrocodone Other (See Comments)    hallucinations   Morphine And Codeine Itching     REVIEW OF SYSTEMS:  [X]  denotes positive finding, [ ]  denotes negative finding Cardiac  Comments:  Chest pain or chest pressure:    Shortness of breath upon exertion:    Short of breath when lying flat:    Irregular heart rhythm:        Vascular    Pain in calf, thigh, or hip brought on by ambulation:    Pain in feet at night that wakes you up from your sleep:     Blood clot in your veins:    Leg swelling:         Pulmonary    Oxygen at home:    Productive cough:     Wheezing:         Neurologic    Sudden weakness in arms or legs:     Sudden numbness in arms or legs:     Sudden onset of difficulty speaking or slurred speech:    Temporary loss of vision in one eye:     Problems with dizziness:         Gastrointestinal    Blood in stool:     Vomited  blood:         Genitourinary    Burning when urinating:     Blood in urine:        Psychiatric    Major depression:         Hematologic    Bleeding problems:    Problems with blood clotting too  easily:        Skin    Rashes or ulcers:        Constitutional    Fever or chills:      PHYSICAL EXAMINATION:  Vitals:   06/26/23 0649 06/26/23 0830  BP: (!) 141/64   Pulse: 74   Resp: 12   Temp: 99.3 F (37.4 C)   TempSrc: Temporal   SpO2: 97% 95%  Weight: 53.1 kg   Height: 5\' 4"  (1.626 m)     General:  WDWN in NAD; vital signs documented above Gait: Ambulates with walker HENT: WNL, normocephalic Pulmonary: normal non-labored breathing  Cardiac: regular HR Abdomen: soft, NT Vascular Exam/Pulses: 2+ femoral pulse, palpable pulse in fem- fem bypass. Brisk doppler DP/PT signals bilaterally Extremities: without ischemic changes, without Gangrene , without cellulitis; without open wounds; multiple healed scars on LLE Musculoskeletal: no muscle wasting or atrophy  Neurologic: A&O X 3 Psychiatric:  The pt has  affect.   Non-Invasive Vascular Imaging:   Fem Fem Graft: Right to Left  +------------------+--------+---------------+----------+---------+                   PSV cm/sStenosis       Waveform  Comments   +------------------+--------+---------------+----------+---------+  Inflow           407     75-99% stenosistriphasic turbulent  +------------------+--------+---------------+----------+---------+  Prox anastomosis  74                     triphasic turbulent  +------------------+--------+---------------+----------+---------+  Proximal graft    69                     triphasic            +------------------+--------+---------------+----------+---------+  Mid graft         36                     triphasic            +------------------+--------+---------------+----------+---------+  Distal graft      46                     monophasic            +------------------+--------+---------------+----------+---------+  Distal anastomosis50                     monophasic           +------------------+--------+---------------+----------+---------+  Outflow          109                    triphasic turbulent  +------------------+--------+---------------+----------+---------+    Summary:  Right: 75-99% stenosis noted in the common femoral artery. Right-Left  Fem-fem bypass graft 75-99% inflow stenosis.   Left Graft #1: Left femoral-popliteal artery bypass graft  +--------------------+--------+---------------+----------+--------+                     PSV cm/sStenosis       Waveform  Comments  +--------------------+--------+---------------+----------+--------+  Inflow             59                     triphasic           +--------------------+--------+---------------+----------+--------+  Proximal Anastomosis54                     biphasic            +--------------------+--------+---------------+----------+--------+  Proximal Graft      49                     biphasic            +--------------------+--------+---------------+----------+--------+  Mid Graft           44                     biphasic            +--------------------+--------+---------------+----------+--------+  Distal Graft        42                     biphasic            +--------------------+--------+---------------+----------+--------+  Distal Anastomosis  57                     monophasic          +--------------------+--------+---------------+----------+--------+  Outflow            117     50-74% stenosismonophasic          +--------------------+--------+---------------+----------+--------+    Summary:  Left: Left femoral popliteal artery bypass graft 50-74% stenosis based on  PSV ration >2.0 with distal blunted waveform.    ASSESSMENT/PLAN:: 82 y.o. male here for follow up for PAD and carotid  artery disease. He has remote history of bilateral common femoral endarterectomies, right to left femoral-femoral bypass and left femoral to above knee popliteal bypass with Propaten on 12/09/13 by Dr. Darrick Penna for ischemic left leg. He currently is without any claudication, rest pain or tissue loss. However he does not really ambulate enough to elicit any claudication symptoms.  -The patient is on best medical therapy for peripheral arterial disease. He will continue his Aspirin and Statin -His duplex today shows increased velocities within the right to left fem- fem bypass graft at the inflow anastomosis. Velocities on last duplex were 283 cm/s and now they are 407 cm/s.   -Will his current elevated velocities and also some lower velocities in the mid graft with biphasic/monophasic flow I am worried that the bypass is threatened and I have therefore recommended angiography. Based on the patient's clinical exam and non-invasive data, we anticipate an endovascular intervention on the right femoral to left femoral bypass graft - He is a prior Dr. Darrick Penna patient so will arrange Aortogram in the near future with next available vascular surgeon  Victorino Sparrow, PA-C [facility] [phone]  Clinic MD:   Myra Gianotti

## 2023-06-26 NOTE — Op Note (Addendum)
    Patient name: Thomas Ferrell MRN: [id number] DOB: [birth year] Sex: male  06/26/2023 Pre-operative Diagnosis: Threatened femoral-femoral, left-sided femoral above-knee popliteal artery bypass Post-operative diagnosis:  Same Surgeon:  Victorino Sparrow, MD Procedure Performed: 1.  Ultrasound-guided micropuncture access of the femoral-femoral bypass 2.  Aortogram 3.  Left lower extremity angiogram 4.  Moderate sedation time 59 minutes, contrast volume 130 mL 5.  Mynx device failure, manual pressure for hemostasis  Indications: Patient is an 82 year old male currently walker dependent with previous history of femoral-femoral bypass, femoral-popliteal artery bypass on the left by my partner Dr. Selmer Dominion.  Recent duplex ultrasound demonstrated elevated velocities on the right sided inflow.  After discussing the risks and benefits of aortogram, femoral-femoral bypass angiogram, left lower extremity angiogram for threatened femoral-femoral, femoral-popliteal bypasses, here elected to proceed.     Findings:   Widely patent infrarenal aorta. Right sided common iliac artery ostial stenosis less than 50% Moderate disease throughout the right common iliac artery, previously placed stent widely patent.  Patent hypogastric artery Patent external neck artery with greater than 70% stenosis at its distal aspect, pullback pressure 30 mmHg  On the right: Widely patent common femoral artery, profunda, proximal superficial femoral artery. Femoral-femoral bypass widely patent with no anastomotic stenosis On the left: Widely patent common femoral artery, retrograde filling of the external iliac artery, widely patent profunda.  The bypass is widely patent but slower to fill within the profunda.  Distally, the popliteal artery is patent with 50% stenosis in the P2 segment of the popliteal artery.  Distally, three-vessel runoff, posterior and anterior tibial arteries dominant running up into the  foot.   Procedure:  The patient was identified in the holding area and taken to room 8.  The patient was then placed supine on the table and prepped and draped in the usual sterile fashion.  A time out was called.  Ultrasound was used to evaluate the femoral-femoral bypass.  The femoral-femoral bypass was stuck in retrograde fashion near the left side anastomosis specifically to assess inflow stenosis.  A series of wires and catheters were used to traverse the right side iliac system.  Angiography followed.  See results below.  Next, angiography of the left lower extremity followed through the sheath.  See results above.   Impression: Greater than 70% stenosis of the right sided, distal external iliac artery. Pullback pressure . Widely patent femoral-femoral, femoral-popliteal bypass.  Prior to the operating room I discussed the procedure with both Aurther Loft and his wife.  They asked to discuss treatment options prior to moving forward.  We discussed endovascular drug-coated balloon angioplasty versus open surgical revision.  I discussed nursing benefits of both, he elected to proceed with endovascular drug-coated balloon angioplasty.  This will be scheduled for next Wednesday.   Fara Olden, MD [facility]  Office: [phone]

## 2023-06-26 NOTE — H&P (Signed)
Office Note   Patient seen and examined in preop holding.  No complaints. No changes to medication history or physical exam since last seen in clinic. After discussing the risks and benefits of angiogram for primary assisted patency of bypasses, Thomas Ferrell elected to proceed.   Victorino Sparrow MD   CC:  follow up Requesting Provider:  No ref. provider found  HPI: Thomas Ferrell is a 82 y.o. ([birth year]) male who presents for follow up of PAD and carotid artery stenosis. He has remote history of bilateral common femoral endarterectomies, right to left femoral-femoral bypass and left femoral to above knee popliteal bypass with Propaten on 12/09/13 by Dr. Darrick Penna for ischemic left leg. He has done well since his surgeries with minimal lower extremity symptoms. He does have a left great toe drop that he wears a brace for. This is secondary to a history of a spinal injury.   Today he is here with his wife. He reports overall doing very well. 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 He ambulates with a rolling walker and says he really does not walk much on account of his neuropathy. He only really has pain when standing otherwise he does not have any pain at rest. No tissue loss.  He also has remote history of right CEA on 10/21/06 by Dr. Darrick Penna for symptomatic carotid stenosis. Today he denies changes in vision, slurred speech, facial drooping, unilateral upper or lower extremity weakness or numbness.    The pt is on a statin for cholesterol management.  The pt is on a daily aspirin.   Other AC:  none The pt is on CCB, ACE for hypertension.   The pt is not diabetic.   Tobacco hx:  former, 2007  Past Medical History:  Diagnosis Date   Abnormality of gait 03/03/2013   bladder ca dx'd 11/2009   chemo/xrt comp 02/2010   Cerebrovascular disease    right brain CVA 2007   COPD (chronic obstructive pulmonary disease) (HCC)     Cough productive of clear sputum    TX RECENT SINUS INFECTION    CVA (cerebral vascular accident) (HCC) 2007   r-cva   Dyslipidemia    Hypertension    Lumbago     Past Surgical History:  Procedure Laterality Date   ABDOMINAL AORTAGRAM N/A 12/07/2013   Procedure: ABDOMINAL Ronny Flurry;  Surgeon: Chuck Hint, MD;  Location: [facility] CATH LAB;  Service: Cardiovascular;  Laterality: N/A;   BACK SURGERY  2010   bladder cancer     CAROTID ARTERY ANGIOPLASTY  2009   CHOLECYSTECTOMY N/A 06/06/2016   Procedure: LAPAROSCOPIC CHOLECYSTECTOMY WITH INTRAOPERATIVE CHOLANGIOGRAM;  Surgeon: Darnell Level, MD;  Location: [facility] ORS;  Service: General;  Laterality: N/A;   ERCP N/A 06/05/2016   Procedure: ENDOSCOPIC RETROGRADE CHOLANGIOPANCREATOGRAPHY (ERCP);  Surgeon: Dorena Cookey, MD;  Location: [facility] ENDOSCOPY;  Service: Endoscopy;  Laterality: N/A;   ESOPHAGOGASTRODUODENOSCOPY (EGD) WITH PROPOFOL N/A 06/05/2016   Procedure: ESOPHAGOGASTRODUODENOSCOPY (EGD);  Surgeon: Dorena Cookey, MD;  Location: [facility] ENDOSCOPY;  Service: Endoscopy;  Laterality: N/A;   FEMORAL-FEMORAL BYPASS GRAFT N/A 12/09/2013   Procedure: BYPASS GRAFT FEMORAL-FEMORAL ARTERY WITH BILATERAL ENDARTERECTOMY ;  Surgeon: Sherren Kerns, MD;  Location: [facility] OR;  Service: Vascular;  Laterality: N/A;   FEMORAL-POPLITEAL BYPASS GRAFT Left 12/09/2013   Procedure: BYPASS GRAFT FEMORAL-POPLITEAL ARTERY - LEFT ;  Surgeon: Sherren Kerns, MD;  Location: [facility] OR;  Service: Vascular;  Laterality: Left;   LAMINECTOMY  09/08/2013   L 2 L3 L4 L5       Dr Phoebe Perch    Social History   Socioeconomic History   Marital status: Married    Spouse name: Not on file   Number of children: 2   Years of education: College   Highest education level: Not on file  Occupational History    Employer: RETIRED    Comment: Retired  Tobacco Use   Smoking status: Former    Current packs/day: 0.00    Types: Cigarettes    Quit date: 11/12/2005    Years since quitting: 17.6     Passive exposure: Never   Smokeless tobacco: Never  Vaping Use   Vaping status: Never Used  Substance and Sexual Activity   Alcohol use: No   Drug use: No   Sexual activity: Not on file  Other Topics Concern   Not on file  Social History Narrative   Not on file   Social Determinants of Health   Financial Resource Strain: Not on file  Food Insecurity: Not on file  Transportation Needs: Not on file  Physical Activity: Not on file  Stress: Not on file  Social Connections: Not on file  Intimate Partner Violence: Not on file    Family History  Problem Relation Age of Onset   Stroke Mother    Cancer Father    Dementia Father    Cancer Sister     No current facility-administered medications for this encounter.   Current Outpatient Medications  Medication Sig Dispense Refill   acetaminophen (TYLENOL) 500 MG tablet Take 1,000 mg by mouth every 6 (six) hours as needed for mild pain or headache.      amLODipine (NORVASC) 10 MG tablet Take 10 mg by mouth daily.     aspirin EC 81 MG tablet Take 81 mg by mouth daily. Swallow whole.     atorvastatin (LIPITOR) 20 MG tablet Take 20 mg by mouth daily.     ketotifen (ZADITOR) 0.035 % ophthalmic solution Place 1 drop into both eyes daily as needed (Dry eye).     lisinopril (PRINIVIL,ZESTRIL) 40 MG tablet Take 40 mg by mouth daily.  5   sertraline (ZOLOFT) 50 MG tablet Take 50 mg by mouth daily.     vitamin B-12 (CYANOCOBALAMIN) 500 MCG tablet Take 500 mcg by mouth daily.      Allergies  Allergen Reactions   Hydrocodone Other (See Comments)    hallucinations   Morphine And Codeine Itching     REVIEW OF SYSTEMS:  [X]  denotes positive finding, [ ]  denotes negative finding Cardiac  Comments:  Chest pain or chest pressure:    Shortness of breath upon exertion:    Short of breath when lying flat:    Irregular heart rhythm:        Vascular    Pain in calf, thigh, or hip brought on by ambulation:    Pain in feet at night that  wakes you up from your sleep:     Blood clot in your veins:    Leg swelling:         Pulmonary    Oxygen at home:    Productive cough:     Wheezing:         Neurologic    Sudden weakness in arms or legs:     Sudden numbness in arms or legs:     Sudden onset of difficulty speaking or slurred speech:  Temporary loss of vision in one eye:     Problems with dizziness:         Gastrointestinal    Blood in stool:     Vomited blood:         Genitourinary    Burning when urinating:     Blood in urine:        Psychiatric    Major depression:         Hematologic    Bleeding problems:    Problems with blood clotting too easily:        Skin    Rashes or ulcers:        Constitutional    Fever or chills:      PHYSICAL EXAMINATION:  Vitals:   06/26/23 0954 06/26/23 1013 06/26/23 1055 06/26/23 1155  BP: (!) 165/67 (!) 147/57 (!) 150/58 136/64  Pulse: 73 65 65 66  Resp: 14     Temp:      TempSrc:      SpO2: 93% 95% 95% (!) 75%  Weight:      Height:        General:  WDWN in NAD; vital signs documented above Gait: Ambulates with walker HENT: WNL, normocephalic Pulmonary: normal non-labored breathing  Cardiac: regular HR Abdomen: soft, NT Vascular Exam/Pulses: 2+ femoral pulse, palpable pulse in fem- fem bypass. Brisk doppler DP/PT signals bilaterally Extremities: without ischemic changes, without Gangrene , without cellulitis; without open wounds; multiple healed scars on LLE Musculoskeletal: no muscle wasting or atrophy  Neurologic: A&O X 3 Psychiatric:  The pt has  affect.   Non-Invasive Vascular Imaging:   Fem Fem Graft: Right to Left  +------------------+--------+---------------+----------+---------+                   PSV cm/sStenosis       Waveform  Comments   +------------------+--------+---------------+----------+---------+  Inflow           407     75-99% stenosistriphasic turbulent   +------------------+--------+---------------+----------+---------+  Prox anastomosis  74                     triphasic turbulent  +------------------+--------+---------------+----------+---------+  Proximal graft    69                     triphasic            +------------------+--------+---------------+----------+---------+  Mid graft         36                     triphasic            +------------------+--------+---------------+----------+---------+  Distal graft      46                     monophasic           +------------------+--------+---------------+----------+---------+  Distal anastomosis50                     monophasic           +------------------+--------+---------------+----------+---------+  Outflow          109                    triphasic turbulent  +------------------+--------+---------------+----------+---------+    Summary:  Right: 75-99% stenosis noted in the common femoral artery. Right-Left  Fem-fem bypass graft 75-99% inflow stenosis.   Left Graft #1: Left femoral-popliteal artery bypass graft  +--------------------+--------+---------------+----------+--------+  PSV cm/sStenosis       Waveform  Comments  +--------------------+--------+---------------+----------+--------+  Inflow             59                     triphasic           +--------------------+--------+---------------+----------+--------+  Proximal Anastomosis54                     biphasic            +--------------------+--------+---------------+----------+--------+  Proximal Graft      49                     biphasic            +--------------------+--------+---------------+----------+--------+  Mid Graft           44                     biphasic            +--------------------+--------+---------------+----------+--------+  Distal Graft        42                     biphasic             +--------------------+--------+---------------+----------+--------+  Distal Anastomosis  57                     monophasic          +--------------------+--------+---------------+----------+--------+  Outflow            117     50-74% stenosismonophasic          +--------------------+--------+---------------+----------+--------+    Summary:  Left: Left femoral popliteal artery bypass graft 50-74% stenosis based on  PSV ration >2.0 with distal blunted waveform.    ASSESSMENT/PLAN:: 82 y.o. male here for follow up for PAD and carotid artery disease. He has remote history of bilateral common femoral endarterectomies, right to left femoral-femoral bypass and left femoral to above knee popliteal bypass with Propaten on 12/09/13 by Dr. Darrick Penna for ischemic left leg. He currently is without any claudication, rest pain or tissue loss. However he does not really ambulate enough to elicit any claudication symptoms.  -The patient is on best medical therapy for peripheral arterial disease. He will continue his Aspirin and Statin -His duplex today shows increased velocities within the right to left fem- fem bypass graft at the inflow anastomosis. Velocities on last duplex were 283 cm/s and now they are 407 cm/s.   -Will his current elevated velocities and also some lower velocities in the mid graft with biphasic/monophasic flow I am worried that the bypass is threatened and I have therefore recommended angiography. Based on the patient's clinical exam and non-invasive data, we anticipate an endovascular intervention on the right femoral to left femoral bypass graft - He is a prior Dr. Darrick Penna patient so will arrange Aortogram in the near future with next available vascular surgeon  Victorino Sparrow, PA-C [facility] [phone]  Clinic MD:   Myra Gianotti

## 2023-06-27 ENCOUNTER — Encounter (HOSPITAL_COMMUNITY): Payer: Self-pay | Admitting: Vascular Surgery

## 2023-06-27 ENCOUNTER — Telehealth: Payer: Self-pay

## 2023-06-27 NOTE — Telephone Encounter (Signed)
Attempted to reach patient to schedule procedure. Left VM for patient to return call.

## 2023-06-28 ENCOUNTER — Other Ambulatory Visit: Payer: Self-pay

## 2023-06-28 DIAGNOSIS — T82858A Stenosis of vascular prosthetic devices, implants and grafts, initial encounter: Secondary | ICD-10-CM

## 2023-06-28 NOTE — Telephone Encounter (Signed)
Spoke with patient's wife, Elease Hashimoto. Scheduled lower extremity angiogram on 8/21 as instructed by Dr. Karin Lieu. Instructions provided and she voiced understanding.

## 2023-07-03 ENCOUNTER — Encounter (HOSPITAL_COMMUNITY)
Admission: RE | Disposition: A | Discharge status: Home / Self Care | Payer: Self-pay | Source: Home / Self Care | Attending: Vascular Surgery

## 2023-07-03 ENCOUNTER — Telehealth: Payer: Self-pay | Admitting: Vascular Surgery

## 2023-07-03 ENCOUNTER — Other Ambulatory Visit: Payer: Self-pay

## 2023-07-03 ENCOUNTER — Ambulatory Visit (HOSPITAL_COMMUNITY)
Admission: RE | Admit: 2023-07-03 | Discharge: 2023-07-03 | Disposition: A | Discharge status: Home / Self Care | Payer: Medicare Other | Attending: Vascular Surgery | Admitting: Vascular Surgery

## 2023-07-03 DIAGNOSIS — T82898A Other specified complication of vascular prosthetic devices, implants and grafts, initial encounter: Secondary | ICD-10-CM

## 2023-07-03 DIAGNOSIS — Z7982 Long term (current) use of aspirin: Secondary | ICD-10-CM | POA: Diagnosis not present

## 2023-07-03 DIAGNOSIS — G629 Polyneuropathy, unspecified: Secondary | ICD-10-CM | POA: Diagnosis not present

## 2023-07-03 DIAGNOSIS — Z87891 Personal history of nicotine dependence: Secondary | ICD-10-CM | POA: Diagnosis not present

## 2023-07-03 DIAGNOSIS — E785 Hyperlipidemia, unspecified: Secondary | ICD-10-CM | POA: Diagnosis not present

## 2023-07-03 DIAGNOSIS — Z79899 Other long term (current) drug therapy: Secondary | ICD-10-CM | POA: Insufficient documentation

## 2023-07-03 DIAGNOSIS — Z95820 Peripheral vascular angioplasty status with implants and grafts: Secondary | ICD-10-CM

## 2023-07-03 DIAGNOSIS — I6529 Occlusion and stenosis of unspecified carotid artery: Secondary | ICD-10-CM | POA: Insufficient documentation

## 2023-07-03 DIAGNOSIS — I70213 Atherosclerosis of native arteries of extremities with intermittent claudication, bilateral legs: Secondary | ICD-10-CM | POA: Diagnosis not present

## 2023-07-03 DIAGNOSIS — T82858A Stenosis of vascular prosthetic devices, implants and grafts, initial encounter: Secondary | ICD-10-CM

## 2023-07-03 DIAGNOSIS — I10 Essential (primary) hypertension: Secondary | ICD-10-CM | POA: Insufficient documentation

## 2023-07-03 HISTORY — PX: LOWER EXTREMITY ANGIOGRAPHY: CATH118251

## 2023-07-03 HISTORY — PX: PERIPHERAL VASCULAR BALLOON ANGIOPLASTY: CATH118281

## 2023-07-03 LAB — POCT I-STAT, CHEM 8
BUN: 8 mg/dL (ref 8–23)
Calcium, Ion: 1.26 mmol/L (ref 1.15–1.40)
Chloride: 102 mmol/L (ref 98–111)
Creatinine, Ser: 0.8 mg/dL (ref 0.61–1.24)
Glucose, Bld: 97 mg/dL (ref 70–99)
HCT: 33 % — ABNORMAL LOW (ref 39.0–52.0)
Hemoglobin: 11.2 g/dL — ABNORMAL LOW (ref 13.0–17.0)
Potassium: 3.7 mmol/L (ref 3.5–5.1)
Sodium: 141 mmol/L (ref 135–145)
TCO2: 26 mmol/L (ref 22–32)

## 2023-07-03 LAB — POCT ACTIVATED CLOTTING TIME
Activated Clotting Time: 164 seconds
Activated Clotting Time: 177 seconds

## 2023-07-03 SURGERY — LOWER EXTREMITY ANGIOGRAPHY
Anesthesia: LOCAL | Laterality: Right

## 2023-07-03 MED ORDER — CLOPIDOGREL BISULFATE 300 MG PO TABS
ORAL_TABLET | ORAL | Status: AC
  Filled 2023-07-03: qty 1

## 2023-07-03 MED ORDER — FENTANYL CITRATE (PF) 100 MCG/2ML IJ SOLN
INTRAMUSCULAR | Status: DC | PRN
  Administered 2023-07-03: 50 ug via INTRAVENOUS

## 2023-07-03 MED ORDER — IODIXANOL 320 MG/ML IV SOLN
INTRAVENOUS | Status: DC | PRN
  Administered 2023-07-03: 20 mL

## 2023-07-03 MED ORDER — MIDAZOLAM HCL 2 MG/2ML IJ SOLN
INTRAMUSCULAR | Status: AC
  Filled 2023-07-03: qty 2

## 2023-07-03 MED ORDER — SODIUM CHLORIDE 0.9 % IV SOLN: 250.0000 mL | INTRAVENOUS | Status: DC | PRN

## 2023-07-03 MED ORDER — ASPIRIN 81 MG PO TBEC: 81.0000 mg | DELAYED_RELEASE_TABLET | Freq: Every day | ORAL | Status: DC

## 2023-07-03 MED ORDER — SODIUM CHLORIDE 0.9 % WEIGHT BASED INFUSION: 1.0000 mL/kg/h | INTRAVENOUS | Status: DC

## 2023-07-03 MED ORDER — HEPARIN SODIUM (PORCINE) 1000 UNIT/ML IJ SOLN
INTRAMUSCULAR | Status: AC
  Filled 2023-07-03: qty 10

## 2023-07-03 MED ORDER — HEPARIN (PORCINE) IN NACL 1000-0.9 UT/500ML-% IV SOLN
INTRAVENOUS | Status: DC | PRN
  Administered 2023-07-03 (×2): 500 mL

## 2023-07-03 MED ORDER — LIDOCAINE HCL (PF) 1 % IJ SOLN
INTRAMUSCULAR | Status: DC | PRN
  Administered 2023-07-03: 15 mL

## 2023-07-03 MED ORDER — SODIUM CHLORIDE 0.9% FLUSH: 3.0000 mL | Freq: Two times a day (BID) | INTRAVENOUS | Status: DC

## 2023-07-03 MED ORDER — CLOPIDOGREL BISULFATE 75 MG PO TABS: 75.0000 mg | ORAL_TABLET | Freq: Every day | ORAL | Status: DC

## 2023-07-03 MED ORDER — CLOPIDOGREL BISULFATE 300 MG PO TABS
ORAL_TABLET | ORAL | Status: DC | PRN
  Administered 2023-07-03: 300 mg via ORAL

## 2023-07-03 MED ORDER — HYDRALAZINE HCL 20 MG/ML IJ SOLN
5.0000 mg | INTRAMUSCULAR | Status: DC | PRN
  Administered 2023-07-03: 5 mg via INTRAVENOUS

## 2023-07-03 MED ORDER — ONDANSETRON HCL 4 MG/2ML IJ SOLN: 4.0000 mg | Freq: Four times a day (QID) | INTRAMUSCULAR | Status: DC | PRN

## 2023-07-03 MED ORDER — LIDOCAINE HCL (PF) 1 % IJ SOLN
INTRAMUSCULAR | Status: AC
  Filled 2023-07-03: qty 30

## 2023-07-03 MED ORDER — ACETAMINOPHEN 325 MG PO TABS: 650.0000 mg | ORAL_TABLET | ORAL | Status: DC | PRN

## 2023-07-03 MED ORDER — SODIUM CHLORIDE 0.9 % IV SOLN: INTRAVENOUS | Status: DC

## 2023-07-03 MED ORDER — MIDAZOLAM HCL 2 MG/2ML IJ SOLN
INTRAMUSCULAR | Status: DC | PRN
  Administered 2023-07-03: 1 mg via INTRAVENOUS

## 2023-07-03 MED ORDER — FENTANYL CITRATE (PF) 100 MCG/2ML IJ SOLN
INTRAMUSCULAR | Status: AC
  Filled 2023-07-03: qty 2

## 2023-07-03 MED ORDER — HYDRALAZINE HCL 20 MG/ML IJ SOLN
INTRAMUSCULAR | Status: AC
  Filled 2023-07-03: qty 1

## 2023-07-03 MED ORDER — ASPIRIN 81 MG PO CHEW
CHEWABLE_TABLET | ORAL | Status: AC
  Filled 2023-07-03: qty 1

## 2023-07-03 MED ORDER — HEPARIN SODIUM (PORCINE) 1000 UNIT/ML IJ SOLN
INTRAMUSCULAR | Status: DC | PRN
  Administered 2023-07-03: 5000 [IU] via INTRAVENOUS

## 2023-07-03 MED ORDER — LABETALOL HCL 5 MG/ML IV SOLN: 10.0000 mg | INTRAVENOUS | Status: DC | PRN

## 2023-07-03 MED ORDER — CLOPIDOGREL BISULFATE 75 MG PO TABS: 75.0000 mg | ORAL_TABLET | Freq: Every day | ORAL | 11 refills | Status: DC

## 2023-07-03 MED ORDER — SODIUM CHLORIDE 0.9% FLUSH: 3.0000 mL | INTRAVENOUS | Status: DC | PRN

## 2023-07-03 SURGICAL SUPPLY — 15 items
BALLN IN.PACT DCB 6X40 (BALLOONS) ×1
BALLN MUSTANG 5.0X40 75 (BALLOONS) ×1
BALLOON MUSTANG 5.0X40 75 (BALLOONS) IMPLANT
COVER DOME SNAP 22 D (MISCELLANEOUS) IMPLANT
DCB IN.PACT 6X40 (BALLOONS) IMPLANT
GLIDEWIRE ADV .035X260CM (WIRE) IMPLANT
KIT ENCORE 26 ADVANTAGE (KITS) IMPLANT
KIT MICROPUNCTURE NIT STIFF (SHEATH) IMPLANT
KIT SYRINGE INJ CVI SPIKEX1 (MISCELLANEOUS) IMPLANT
SET ATX-X65L (MISCELLANEOUS) IMPLANT
SHEATH PINNACLE 5F 10CM (SHEATH) IMPLANT
SHEATH PINNACLE R/O II 6F 4CM (SHEATH) IMPLANT
SHEATH PROBE COVER 6X72 (BAG) IMPLANT
TRAY PV CATH (CUSTOM PROCEDURE TRAY) ×1 IMPLANT
WIRE BENTSON .035X145CM (WIRE) IMPLANT

## 2023-07-03 NOTE — Telephone Encounter (Signed)
Pt currently in cath lab.

## 2023-07-03 NOTE — Telephone Encounter (Signed)
----- Message from Victorino Sparrow sent at 07/03/2023  9:55 AM EDT ----- Regarding: Angio Case      Patient name: Thomas Ferrell            MRN: [id number]        DOB: [birth year]        Sex: male   07/03/2023 Pre-operative Diagnosis: Threatened right to left femoral-femoral bypass, left-sided femoral above-knee popliteal artery bypass Post-operative diagnosis:  Same Surgeon:  Victorino Sparrow, MD Procedure Performed: 1.  Ultrasound-guided micropuncture access of the right common femoral artery 2.  Diagnostic angiography of the right external iliac artery, common femoral artery, femoral-femoral bypass 3.  Drug-coated balloon angioplasty 6 x 40 distal external iliac artery 4.  Access managed with manual pressure 5.  Moderate sedation time 23 minutes, contrast volume 20 mL     Indications: Patient is an 82 year old male currently walker dependent with previous history of femoral-femoral bypass, femoral-popliteal artery bypass on the left by my partner Dr. Selmer Dominion. Recent duplex ultrasound demonstrated elevated velocities on the right sided inflow.  Diagnostic angiography demonstrated a 30 mmHg gradient from the distal external neck artery into the common femoral artery.  We discussed open repair versus endovascular balloon angioplasty.  After discussing the risks and benefits, specifically in context with his current comorbidities and ambulatory status, care elected to proceed with endovascular balloon angioplasty, and asked to stay away from open surgery.  All of his comorbidities, I think he would be best suited with after discussing the risks and benefits of aortogram, femoral-femoral bypass angiogram, left lower extremity angiogram for threatened femoral-femoral, femoral-popliteal bypasses, here elected to proceed.    Findings:  Greater than 70% stenosis of the right distal external iliac artery with pullback pressure of 35 mmHg                Procedure:  The patient was identified in  the holding area and taken to room 8.  The patient was then placed supine on the table and prepped and draped in the usual sterile fashion.  A time out was called.  Ultrasound was used to evaluate the right common femoral artery.  It was patent .  A digital ultrasound image was acquired.  A micropuncture needle was used to access the right common femoral artery under ultrasound guidance.  An 018 wire was advanced without resistance and a micropuncture sheath was placed.  The 018 wire was removed and a benson wire was placed.  The micropuncture sheath was exchanged for a 6 french short sheath.  Diagnostic angiography of the right external iliac artery followed in multiple views.  See results above.   I elected to intervene on the distal external iliac artery.  The patient was heparinized, and a 5 x 40 mm balloon was brought onto the field and expanded along the lesion.  This expanded nicely after considerable amount of waist.  This was inflated for 3 minutes.  Following this, angiography demonstrated significant improvement to stenosis less than 50%.  I then moved to a 6 x 40 mm drug-coated balloon, inflated the balloon for 3 minutes.  Follow-up angiography demonstrated less than 50% stenosis.  A pullback pressure from the sheath demonstrated significant improvement to 15 mmHg. I elected to terminate the case.  Manual pressure was held for arteriotomy management.   Impression: Successful drug-coated balloon angioplasty 6 x 40 mm of the right distal external iliac artery with stenosis less than 50% on angiography.  No plans for open  surgical intervention.  Difference in pullback pressure can also be explained due to inflow-outflow phenomenon. Will continue to follow in the outpatient setting. Continues to have palpable femoral femoral bypass.     Please set one month follow up with me with Fem-fem ultrasound, left arterial ultrasound, ABI      Fara Olden, MD [facility]  [place] Office: [phone]

## 2023-07-03 NOTE — Progress Notes (Signed)
Site area: rt groin 80F arterial sheath Site Prior to Removal:  Level 0 Pressure Applied For: 30 minutes Manual:   yes Patient Status During Pull:  stable Post Pull Site:  Level 0 Post Pull Instructions Given:  yes Post Pull Pulses Present: rt dp and pt dopplered Dressing Applied:  gauze and tegaderm Bedrest begins @ 1245 Comments:

## 2023-07-03 NOTE — Op Note (Signed)
    Patient name: Thomas Ferrell MRN: [id number] DOB: February 17, 1941 Sex: male  07/03/2023 Pre-operative Diagnosis: Threatened right to left femoral-femoral bypass, left-sided femoral above-knee popliteal artery bypass Post-operative diagnosis:  Same Surgeon:  Victorino Sparrow, MD Procedure Performed: 1.  Ultrasound-guided micropuncture access of the right common femoral artery 2.  Diagnostic angiography of the right external iliac artery, common femoral artery, femoral-femoral bypass 3.  Drug-coated balloon angioplasty 6 x 40 distal external iliac artery 4.  Access managed with manual pressure 5.  Moderate sedation time 23 minutes, contrast volume 20 mL   Indications: Patient is an 82 year old male currently walker dependent with previous history of femoral-femoral bypass, femoral-popliteal artery bypass on the left by my partner Dr. Selmer Dominion. Recent duplex ultrasound demonstrated elevated velocities on the right sided inflow.  Diagnostic angiography demonstrated a 30 mmHg gradient from the distal external neck artery into the common femoral artery.  We discussed open repair versus endovascular balloon angioplasty.  After discussing the risks and benefits, specifically in context with his current comorbidities and ambulatory status, care elected to proceed with endovascular balloon angioplasty, and asked to stay away from open surgery.  All of his comorbidities, I think he would be best suited with after discussing the risks and benefits of aortogram, femoral-femoral bypass angiogram, left lower extremity angiogram for threatened femoral-femoral, femoral-popliteal bypasses, here elected to proceed.   Findings:  Greater than 70% stenosis of the right distal external iliac artery with pullback pressure of 35 mmHg    Procedure:  The patient was identified in the holding area and taken to room 8.  The patient was then placed supine on the table and prepped and draped in the usual sterile fashion.   A time out was called.  Ultrasound was used to evaluate the right common femoral artery.  It was patent .  A digital ultrasound image was acquired.  A micropuncture needle was used to access the right common femoral artery under ultrasound guidance.  An 018 wire was advanced without resistance and a micropuncture sheath was placed.  The 018 wire was removed and a benson wire was placed.  The micropuncture sheath was exchanged for a 6 french short sheath.  Diagnostic angiography of the right external iliac artery followed in multiple views.  See results above.  I elected to intervene on the distal external iliac artery.  The patient was heparinized, and a 5 x 40 mm balloon was brought onto the field and expanded along the lesion.  This expanded nicely after considerable amount of waist.  This was inflated for 3 minutes.  Following this, angiography demonstrated significant improvement to stenosis less than 50%.  I then moved to a 6 x 40 mm drug-coated balloon, inflated the balloon for 3 minutes.  Follow-up angiography demonstrated less than 50% stenosis.  A pullback pressure from the sheath demonstrated significant improvement to 15 mmHg. I elected to terminate the case.  Manual pressure was held for arteriotomy management.  Impression: Successful drug-coated balloon angioplasty 6 x 40 mm of the right distal external iliac artery with stenosis less than 50% on angiography.  No plans for open surgical intervention.  Difference in pullback pressure can also be explained due to inflow-outflow phenomenon. Will continue to follow in the outpatient setting. Continues to have palpable femoral femoral bypass.     Fara Olden, MD [facility] [place] Office: [phone]

## 2023-07-03 NOTE — H&P (Signed)
Office Note   Patient seen and examined in preop holding.  No complaints. No changes to medication history or physical exam since last seen. 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 Plan for drug-coated balloon angioplasty today.  After discussing the risks and benefits of angiogram with intervention for primary assisted patency of bypasses, Thomas Ferrell elected to proceed.   Victorino Sparrow MD   CC:  follow up Requesting Provider:  No ref. provider found  HPI: Thomas Ferrell is a 82 y.o. ([birth year]) male who presents for follow up of PAD and carotid artery stenosis. He has remote history of bilateral common femoral endarterectomies, right to left femoral-femoral bypass and left femoral to above knee popliteal bypass with Propaten on 12/09/13 by Dr. Darrick Penna for ischemic left leg. He has done well since his surgeries with minimal lower extremity symptoms. He does have a left great toe drop that he wears a brace for. This is secondary to a history of a spinal injury.   Today he is here with his wife. He reports overall doing very well. 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 He ambulates with a rolling walker and says he really does not walk much on account of his neuropathy. He only really has pain when standing otherwise he does not have any pain at rest. No tissue loss.  He also has remote history of right CEA on 10/21/06 by Dr. Darrick Penna for symptomatic carotid stenosis. Today he denies changes in vision, slurred speech, facial drooping, unilateral upper or lower extremity weakness or numbness.    The pt is on a statin for cholesterol management.  The pt is on a daily aspirin.   Other AC:  none The pt is on CCB, ACE for hypertension.   The pt is not diabetic.   Tobacco hx:  former, 2007  Past Medical History:  Diagnosis Date   Abnormality of gait 03/03/2013   bladder ca dx'd 11/2009   chemo/xrt comp 02/2010    Cerebrovascular disease    right brain CVA 2007   COPD (chronic obstructive pulmonary disease) (HCC)    Cough productive of clear sputum    TX RECENT SINUS INFECTION    CVA (cerebral vascular accident) (HCC) 2007   r-cva   Dyslipidemia    Hypertension    Lumbago     Past Surgical History:  Procedure Laterality Date   ABDOMINAL AORTAGRAM N/A 12/07/2013   Procedure: ABDOMINAL Ronny Flurry;  Surgeon: Chuck Hint, MD;  Location: [facility] CATH LAB;  Service: Cardiovascular;  Laterality: N/A;   ABDOMINAL AORTOGRAM W/LOWER EXTREMITY N/A 06/26/2023   Procedure: ABDOMINAL AORTOGRAM W/LOWER EXTREMITY;  Surgeon: Victorino Sparrow, MD;  Location: [facility] INVASIVE CV LAB;  Service: Cardiovascular;  Laterality: N/A;   BACK SURGERY  2010   bladder cancer     CAROTID ARTERY ANGIOPLASTY  2009   CHOLECYSTECTOMY N/A 06/06/2016   Procedure: LAPAROSCOPIC CHOLECYSTECTOMY WITH INTRAOPERATIVE CHOLANGIOGRAM;  Surgeon: Darnell Level, MD;  Location: [facility] ORS;  Service: General;  Laterality: N/A;   ERCP N/A 06/05/2016   Procedure: ENDOSCOPIC RETROGRADE CHOLANGIOPANCREATOGRAPHY (ERCP);  Surgeon: Dorena Cookey, MD;  Location: [facility] ENDOSCOPY;  Service: Endoscopy;  Laterality: N/A;   ESOPHAGOGASTRODUODENOSCOPY (EGD) WITH PROPOFOL N/A 06/05/2016   Procedure: ESOPHAGOGASTRODUODENOSCOPY (EGD);  Surgeon: Dorena Cookey, MD;  Location: [facility] ENDOSCOPY;  Service: Endoscopy;  Laterality: N/A;   FEMORAL-FEMORAL BYPASS GRAFT N/A 12/09/2013   Procedure: BYPASS GRAFT FEMORAL-FEMORAL ARTERY WITH BILATERAL ENDARTERECTOMY ;  Surgeon: Sherren Kerns, MD;  Location: [facility] OR;  Service: Vascular;  Laterality: N/A;   FEMORAL-POPLITEAL BYPASS GRAFT Left 12/09/2013   Procedure: BYPASS GRAFT FEMORAL-POPLITEAL ARTERY - LEFT ;  Surgeon: Sherren Kerns, MD;  Location: [facility] OR;  Service: Vascular;  Laterality: Left;   LAMINECTOMY  09/08/2013   L 2 L3 L4 L5       Dr Phoebe Perch    Social History   Socioeconomic History   Marital status: Married    Spouse name: Not on  file   Number of children: 2   Years of education: College   Highest education level: Not on file  Occupational History    Employer: RETIRED    Comment: Retired  Tobacco Use   Smoking status: Former    Current packs/day: 0.00    Types: Cigarettes    Quit date: 11/12/2005    Years since quitting: 17.6    Passive exposure: Never   Smokeless tobacco: Never  Vaping Use   Vaping status: Never Used  Substance and Sexual Activity   Alcohol use: No   Drug use: No   Sexual activity: Not on file  Other Topics Concern   Not on file  Social History Narrative   Not on file   Social Determinants of Health   Financial Resource Strain: Not on file  Food Insecurity: Not on file  Transportation Needs: Not on file  Physical Activity: Not on file  Stress: Not on file  Social Connections: Not on file  Intimate Partner Violence: Not on file    Family History  Problem Relation Age of Onset   Stroke Mother    Cancer Father    Dementia Father    Cancer Sister     Current Facility-Administered Medications  Medication Dose Route Frequency Provider Last Rate Last Admin   0.9 %  sodium chloride infusion   Intravenous Continuous Victorino Sparrow, MD 100 mL/hr at 07/03/23 0710 New Bag at 07/03/23 0710    Allergies  Allergen Reactions   Hydrocodone Other (See Comments)    hallucinations   Morphine And Codeine Itching     REVIEW OF SYSTEMS:  [X]  denotes positive finding, [ ]  denotes negative finding Cardiac  Comments:  Chest pain or chest pressure:    Shortness of breath upon exertion:    Short of breath when lying flat:    Irregular heart rhythm:        Vascular    Pain in calf, thigh, or hip brought on by ambulation:    Pain in feet at night that wakes you up from your sleep:     Blood clot in your veins:    Leg swelling:         Pulmonary    Oxygen at home:    Productive cough:     Wheezing:         Neurologic    Sudden weakness in arms or legs:     Sudden numbness in  arms or legs:     Sudden onset of difficulty speaking or slurred speech:    Temporary loss of vision in one eye:     Problems with dizziness:         Gastrointestinal    Blood in stool:     Vomited blood:         Genitourinary    Burning when urinating:     Blood in urine:        Psychiatric    Major depression:  Hematologic    Bleeding problems:    Problems with blood clotting too easily:        Skin    Rashes or ulcers:        Constitutional    Fever or chills:      PHYSICAL EXAMINATION:  There were no vitals filed for this visit.   General:  WDWN in NAD; vital signs documented above Gait: Ambulates with walker HENT: WNL, normocephalic Pulmonary: normal non-labored breathing  Cardiac: regular HR Abdomen: soft, NT Vascular Exam/Pulses: 2+ femoral pulse, palpable pulse in fem- fem bypass. Brisk doppler DP/PT signals bilaterally Extremities: without ischemic changes, without Gangrene , without cellulitis; without open wounds; multiple healed scars on LLE Musculoskeletal: no muscle wasting or atrophy  Neurologic: A&O X 3 Psychiatric:  The pt has  affect.   Non-Invasive Vascular Imaging:   Fem Fem Graft: Right to Left  +------------------+--------+---------------+----------+---------+                   PSV cm/sStenosis       Waveform  Comments   +------------------+--------+---------------+----------+---------+  Inflow           407     75-99% stenosistriphasic turbulent  +------------------+--------+---------------+----------+---------+  Prox anastomosis  74                     triphasic turbulent  +------------------+--------+---------------+----------+---------+  Proximal graft    69                     triphasic            +------------------+--------+---------------+----------+---------+  Mid graft         36                     triphasic            +------------------+--------+---------------+----------+---------+   Distal graft      46                     monophasic           +------------------+--------+---------------+----------+---------+  Distal anastomosis50                     monophasic           +------------------+--------+---------------+----------+---------+  Outflow          109                    triphasic turbulent  +------------------+--------+---------------+----------+---------+    Summary:  Right: 75-99% stenosis noted in the common femoral artery. Right-Left  Fem-fem bypass graft 75-99% inflow stenosis.   Left Graft #1: Left femoral-popliteal artery bypass graft  +--------------------+--------+---------------+----------+--------+                     PSV cm/sStenosis       Waveform  Comments  +--------------------+--------+---------------+----------+--------+  Inflow             59                     triphasic           +--------------------+--------+---------------+----------+--------+  Proximal Anastomosis54                     biphasic            +--------------------+--------+---------------+----------+--------+  Proximal Graft      49  biphasic            +--------------------+--------+---------------+----------+--------+  Mid Graft           44                     biphasic            +--------------------+--------+---------------+----------+--------+  Distal Graft        42                     biphasic            +--------------------+--------+---------------+----------+--------+  Distal Anastomosis  57                     monophasic          +--------------------+--------+---------------+----------+--------+  Outflow            117     50-74% stenosismonophasic          +--------------------+--------+---------------+----------+--------+    Summary:  Left: Left femoral popliteal artery bypass graft 50-74% stenosis based on  PSV ration >2.0 with distal blunted waveform.     ASSESSMENT/PLAN:: 82 y.o. male here for follow up for PAD and carotid artery disease. He has remote history of bilateral common femoral endarterectomies, right to left femoral-femoral bypass and left femoral to above knee popliteal bypass with Propaten on 12/09/13 by Dr. Darrick Penna for ischemic left leg. He currently is without any claudication, rest pain or tissue loss. However he does not really ambulate enough to elicit any claudication symptoms.  -The patient is on best medical therapy for peripheral arterial disease. He will continue his Aspirin and Statin -His duplex today shows increased velocities within the right to left fem- fem bypass graft at the inflow anastomosis. Velocities on last duplex were 283 cm/s and now they are 407 cm/s.   -Will his current elevated velocities and also some lower velocities in the mid graft with biphasic/monophasic flow I am worried that the bypass is threatened and I have therefore recommended angiography. Based on the patient's clinical exam and non-invasive data, we anticipate an endovascular intervention on the right femoral to left femoral bypass graft - He is a prior Dr. Darrick Penna patient so will arrange Aortogram in the near future with next available vascular surgeon  Victorino Sparrow, PA-C [facility] [phone]  Clinic MD:   Myra Gianotti

## 2023-07-04 ENCOUNTER — Encounter (HOSPITAL_COMMUNITY): Payer: Self-pay | Admitting: Vascular Surgery

## 2023-07-05 LAB — POCT ACTIVATED CLOTTING TIME: Activated Clotting Time: 250 s

## 2023-07-08 NOTE — Telephone Encounter (Signed)
Pt appt scheduled

## 2023-07-12 NOTE — Telephone Encounter (Signed)
Pt appt scheduled

## 2023-07-18 ENCOUNTER — Other Ambulatory Visit: Payer: Self-pay | Admitting: *Deleted

## 2023-07-18 DIAGNOSIS — Z95828 Presence of other vascular implants and grafts: Secondary | ICD-10-CM

## 2023-07-18 DIAGNOSIS — I739 Peripheral vascular disease, unspecified: Secondary | ICD-10-CM

## 2023-07-25 ENCOUNTER — Ambulatory Visit (INDEPENDENT_AMBULATORY_CARE_PROVIDER_SITE_OTHER)
Admission: RE | Admit: 2023-07-25 | Discharge: 2023-07-25 | Disposition: A | Discharge status: Home / Self Care | Payer: Medicare Other | Source: Ambulatory Visit | Attending: Vascular Surgery | Admitting: Vascular Surgery

## 2023-07-25 ENCOUNTER — Ambulatory Visit (HOSPITAL_COMMUNITY)
Admission: RE | Admit: 2023-07-25 | Discharge: 2023-07-25 | Disposition: A | Discharge status: Home / Self Care | Payer: Medicare Other | Source: Ambulatory Visit | Attending: Vascular Surgery | Admitting: Vascular Surgery

## 2023-07-25 DIAGNOSIS — Z95828 Presence of other vascular implants and grafts: Secondary | ICD-10-CM | POA: Insufficient documentation

## 2023-07-25 DIAGNOSIS — I739 Peripheral vascular disease, unspecified: Secondary | ICD-10-CM

## 2023-07-25 LAB — VAS US ABI WITH/WO TBI
Left ABI: 0.78
Right ABI: 0.64

## 2023-08-13 NOTE — Progress Notes (Unsigned)
Office Note     CC:  follow up Requesting Provider:  Joycelyn Rua, MD  HPI: Thomas Ferrell is a 82 y.o. ([birth year]) male who presents for follow up of PAD and carotid artery stenosis. He has remote history of bilateral common femoral endarterectomies, right to left femoral-femoral bypass and left femoral to above knee popliteal bypass with Propaten on 12/09/13 by Dr. Darrick Penna for ischemic left leg. He has done well since his surgeries with minimal lower extremity symptoms. He does have a left great toe drop that he wears a brace for. This is secondary to a history of a spinal injury.   Today he is here with his wife. He reports overall doing very well. 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 He ambulates with a rolling walker and says he really does not walk much on account of his neuropathy. He only really has pain when standing otherwise he does not have any pain at rest. No tissue loss.  He also has remote history of right CEA on 10/21/06 by Dr. Darrick Penna for symptomatic carotid stenosis. Today he denies changes in vision, slurred speech, facial drooping, unilateral upper or lower extremity weakness or numbness.    The pt is on a statin for cholesterol management.  The pt is on a daily aspirin.   Other AC:  none The pt is on CCB, ACE for hypertension.   The pt is not diabetic.   Tobacco hx:  former, 2007  Past Medical History:  Diagnosis Date   Abnormality of gait 03/03/2013   bladder ca dx'd 11/2009   chemo/xrt comp 02/2010   Cerebrovascular disease    right brain CVA 2007   COPD (chronic obstructive pulmonary disease) (HCC)    Cough productive of clear sputum    TX RECENT SINUS INFECTION    CVA (cerebral vascular accident) (HCC) 2007   r-cva   Dyslipidemia    Hypertension    Lumbago     Past Surgical History:  Procedure Laterality Date   ABDOMINAL AORTAGRAM N/A 12/07/2013   Procedure: ABDOMINAL Ronny Flurry;   Surgeon: Chuck Hint, MD;  Location: [facility] CATH LAB;  Service: Cardiovascular;  Laterality: N/A;   ABDOMINAL AORTOGRAM W/LOWER EXTREMITY N/A 06/26/2023   Procedure: ABDOMINAL AORTOGRAM W/LOWER EXTREMITY;  Surgeon: Victorino Sparrow, MD;  Location: [facility] INVASIVE CV LAB;  Service: Cardiovascular;  Laterality: N/A;   BACK SURGERY  2010   bladder cancer     CAROTID ARTERY ANGIOPLASTY  2009   CHOLECYSTECTOMY N/A 06/06/2016   Procedure: LAPAROSCOPIC CHOLECYSTECTOMY WITH INTRAOPERATIVE CHOLANGIOGRAM;  Surgeon: Darnell Level, MD;  Location: [facility] ORS;  Service: General;  Laterality: N/A;   ERCP N/A 06/05/2016   Procedure: ENDOSCOPIC RETROGRADE CHOLANGIOPANCREATOGRAPHY (ERCP);  Surgeon: Dorena Cookey, MD;  Location: [facility] ENDOSCOPY;  Service: Endoscopy;  Laterality: N/A;   ESOPHAGOGASTRODUODENOSCOPY (EGD) WITH PROPOFOL N/A 06/05/2016   Procedure: ESOPHAGOGASTRODUODENOSCOPY (EGD);  Surgeon: Dorena Cookey, MD;  Location: [facility] ENDOSCOPY;  Service: Endoscopy;  Laterality: N/A;   FEMORAL-FEMORAL BYPASS GRAFT N/A 12/09/2013   Procedure: BYPASS GRAFT FEMORAL-FEMORAL ARTERY WITH BILATERAL ENDARTERECTOMY ;  Surgeon: Sherren Kerns, MD;  Location: [facility] OR;  Service: Vascular;  Laterality: N/A;   FEMORAL-POPLITEAL BYPASS GRAFT Left 12/09/2013   Procedure: BYPASS GRAFT FEMORAL-POPLITEAL ARTERY - LEFT ;  Surgeon: Sherren Kerns, MD;  Location: [facility] OR;  Service: Vascular;  Laterality: Left;   LAMINECTOMY  09/08/2013   L 2 L3 L4 L5  Dr Phoebe Perch   LOWER EXTREMITY ANGIOGRAPHY Right 07/03/2023   Procedure: Lower Extremity Angiography;  Surgeon: Victorino Sparrow, MD;  Location: [facility] INVASIVE CV LAB;  Service: Cardiovascular;  Laterality: Right;   PERIPHERAL VASCULAR BALLOON ANGIOPLASTY Right 07/03/2023   Procedure: PERIPHERAL VASCULAR BALLOON ANGIOPLASTY;  Surgeon: Victorino Sparrow, MD;  Location: [facility] INVASIVE CV LAB;  Service: Cardiovascular;  Laterality: Right;  R external iliac    Social History   Socioeconomic History   Marital  status: Married    Spouse name: Not on file   Number of children: 2   Years of education: College   Highest education level: Not on file  Occupational History    Employer: RETIRED    Comment: Retired  Tobacco Use   Smoking status: Former    Current packs/day: 0.00    Types: Cigarettes    Quit date: 11/12/2005    Years since quitting: 17.7    Passive exposure: Never   Smokeless tobacco: Never  Vaping Use   Vaping status: Never Used  Substance and Sexual Activity   Alcohol use: No   Drug use: No   Sexual activity: Not on file  Other Topics Concern   Not on file  Social History Narrative   Not on file   Social Determinants of Health   Financial Resource Strain: Not on file  Food Insecurity: Not on file  Transportation Needs: Not on file  Physical Activity: Not on file  Stress: Not on file  Social Connections: Not on file  Intimate Partner Violence: Not on file    Family History  Problem Relation Age of Onset   Stroke Mother    Cancer Father    Dementia Father    Cancer Sister     Current Outpatient Medications  Medication Sig Dispense Refill   acetaminophen (TYLENOL) 500 MG tablet Take 1,000 mg by mouth every 6 (six) hours as needed for mild pain or headache.      amLODipine (NORVASC) 10 MG tablet Take 10 mg by mouth daily.     aspirin EC 81 MG tablet Take 81 mg by mouth daily. Swallow whole.     atorvastatin (LIPITOR) 20 MG tablet Take 20 mg by mouth daily.     clopidogrel (PLAVIX) 75 MG tablet Take 1 tablet (75 mg total) by mouth daily. 30 tablet 11   ketotifen (ZADITOR) 0.035 % ophthalmic solution Place 1 drop into both eyes daily as needed (Dry eye).     lisinopril (PRINIVIL,ZESTRIL) 40 MG tablet Take 40 mg by mouth daily.  5   sertraline (ZOLOFT) 50 MG tablet Take 50 mg by mouth daily.     vitamin B-12 (CYANOCOBALAMIN) 500 MCG tablet Take 500 mcg by mouth daily.     No current facility-administered medications for this visit.    Allergies  Allergen  Reactions   Hydrocodone Other (See Comments)    hallucinations   Morphine And Codeine Itching     REVIEW OF SYSTEMS:  [X]  denotes positive finding, [ ]  denotes negative finding Cardiac  Comments:  Chest pain or chest pressure:    Shortness of breath upon exertion:    Short of breath when lying flat:    Irregular heart rhythm:        Vascular    Pain in calf, thigh, or hip brought on by ambulation:    Pain in feet at night that wakes you up from your sleep:     Blood clot in your veins:    Leg  swelling:         Pulmonary    Oxygen at home:    Productive cough:     Wheezing:         Neurologic    Sudden weakness in arms or legs:     Sudden numbness in arms or legs:     Sudden onset of difficulty speaking or slurred speech:    Temporary loss of vision in one eye:     Problems with dizziness:         Gastrointestinal    Blood in stool:     Vomited blood:         Genitourinary    Burning when urinating:     Blood in urine:        Psychiatric    Major depression:         Hematologic    Bleeding problems:    Problems with blood clotting too easily:        Skin    Rashes or ulcers:        Constitutional    Fever or chills:      PHYSICAL EXAMINATION:  There were no vitals filed for this visit.   General:  WDWN in NAD; vital signs documented above Gait: Ambulates with walker HENT: WNL, normocephalic Pulmonary: normal non-labored breathing  Cardiac: regular HR Abdomen: soft, NT Vascular Exam/Pulses: 2+ femoral pulse, palpable pulse in fem- fem bypass. Brisk doppler DP/PT signals bilaterally Extremities: without ischemic changes, without Gangrene , without cellulitis; without open wounds; multiple healed scars on LLE Musculoskeletal: no muscle wasting or atrophy  Neurologic: A&O X 3 Psychiatric:  The pt has  affect.   Non-Invasive Vascular Imaging:   Fem Fem Graft: Right to Left  +------------------+--------+---------------+----------+--------------+                    PSV cm/sStenosis       Waveform  Comments        +------------------+--------+---------------+----------+--------------+  Inflow           407     75-99% stenosismonophasic                +------------------+--------+---------------+----------+--------------+  Prox anastomosis  179                    monophasic                +------------------+--------+---------------+----------+--------------+  Proximal graft    65                     monophasic                +------------------+--------+---------------+----------+--------------+  Mid graft         33                     monophasic                +------------------+--------+---------------+----------+--------------+  Distal graft      44                     monophasic                +------------------+--------+---------------+----------+--------------+  Distal anastomosis38                     monophasic                +------------------+--------+---------------+----------+--------------+  Outflow  58                     biphasic  fem-pop inflow  +------------------+--------+---------------+----------+--------------+     +-----------+--------+-----+--------+----------+--------+  LEFT      PSV cm/sRatioStenosisWaveform  Comments  +-----------+--------+-----+--------+----------+--------+  POP Distal 49                   monophasic          +-----------+--------+-----+--------+----------+--------+  ATA Distal 89                   biphasic            +-----------+--------+-----+--------+----------+--------+  PTA Distal 43                   monophasic          +-----------+--------+-----+--------+----------+--------+  PERO Distal20                   monophasic          +-----------+--------+-----+--------+----------+--------+   Left Graft #1: fem-BK pop  +--------------------+--------+--------+----------+---------------+                      PSV cm/sStenosisWaveform  Comments         +--------------------+--------+--------+----------+---------------+  Inflow             58              biphasic  fem-fem outflow  +--------------------+--------+--------+----------+---------------+  Proximal Anastomosis54              biphasic                   +--------------------+--------+--------+----------+---------------+  Proximal Graft      37              biphasic                   +--------------------+--------+--------+----------+---------------+  Mid Graft           40              monophasic                 +--------------------+--------+--------+----------+---------------+  Distal Graft        36              biphasic                   +--------------------+--------+--------+----------+---------------+  Distal Anastomosis  53              biphasic                   +--------------------+--------+--------+----------+---------------+  Outflow            43              biphasic                   +--------------------+--------+--------+----------+---------------+   _____________________________________________________  +-------+-----------+-----------+------------+------------+  ABI/TBIToday's ABIToday's TBIPrevious ABIPrevious TBI  +-------+-----------+-----------+------------+------------+  Right 0.64       0.33       0.73        0.54          +-------+-----------+-----------+------------+------------+  Left  0.78       0.34       0.8         0.49          +-------+-----------+-----------+------------+------------+    Summary:  Left: Patent left  to right fem-fem bypass with 75-99% inflow stenosis.  Patent fem-BK pop bypass graft with no stenosis identified.   ASSESSMENT/PLAN:: 82 y.o. male here for follow up for PAD and carotid artery disease. He has remote history of bilateral common femoral endarterectomies, right to left femoral-femoral bypass and  left femoral to above knee popliteal bypass with Propaten on 12/09/13 by Dr. Darrick Penna for ischemic left leg. He currently is without any claudication, rest pain or tissue loss. However he does not really ambulate enough to elicit any claudication symptoms.  -The patient is on best medical therapy for peripheral arterial disease. He will continue his Aspirin and Statin -His duplex today shows increased velocities within the right to left fem- fem bypass graft at the inflow anastomosis. Velocities on last duplex were 283 cm/s and now they are 407 cm/s.   -Will his current elevated velocities and also some lower velocities in the mid graft with biphasic/monophasic flow I am worried that the bypass is threatened and I have therefore recommended angiography. Based on the patient's clinical exam and non-invasive data, we anticipate an endovascular intervention on the right femoral to left femoral bypass graft - He is a prior Dr. Darrick Penna patient so will arrange Aortogram in the near future with next available vascular surgeon  Victorino Sparrow, PA-C [facility] [phone]  Clinic MD:   Myra Gianotti

## 2023-08-15 ENCOUNTER — Encounter: Payer: Self-pay | Admitting: Vascular Surgery

## 2023-08-15 ENCOUNTER — Ambulatory Visit: Payer: Medicare Other | Admitting: Vascular Surgery

## 2023-08-15 VITALS — BP 151/63 | HR 80 | Temp 98.5°F | Resp 20 | Ht 64.0 in | Wt 117.0 lb

## 2023-08-15 DIAGNOSIS — I70239 Atherosclerosis of native arteries of right leg with ulceration of unspecified site: Secondary | ICD-10-CM

## 2023-08-15 DIAGNOSIS — Z9889 Other specified postprocedural states: Secondary | ICD-10-CM

## 2023-08-15 DIAGNOSIS — Z95828 Presence of other vascular implants and grafts: Secondary | ICD-10-CM

## 2023-09-11 ENCOUNTER — Other Ambulatory Visit: Payer: Self-pay

## 2023-09-11 DIAGNOSIS — I739 Peripheral vascular disease, unspecified: Secondary | ICD-10-CM

## 2023-10-24 DIAGNOSIS — I739 Peripheral vascular disease, unspecified: Secondary | ICD-10-CM | POA: Diagnosis not present

## 2023-10-24 DIAGNOSIS — R262 Difficulty in walking, not elsewhere classified: Secondary | ICD-10-CM | POA: Diagnosis not present

## 2023-10-24 DIAGNOSIS — E46 Unspecified protein-calorie malnutrition: Secondary | ICD-10-CM | POA: Diagnosis not present

## 2023-10-24 DIAGNOSIS — J449 Chronic obstructive pulmonary disease, unspecified: Secondary | ICD-10-CM | POA: Diagnosis not present

## 2023-10-24 DIAGNOSIS — Z9989 Dependence on other enabling machines and devices: Secondary | ICD-10-CM | POA: Diagnosis not present

## 2023-10-24 DIAGNOSIS — I10 Essential (primary) hypertension: Secondary | ICD-10-CM | POA: Diagnosis not present

## 2023-10-24 DIAGNOSIS — E785 Hyperlipidemia, unspecified: Secondary | ICD-10-CM | POA: Diagnosis not present

## 2023-10-28 DIAGNOSIS — I10 Essential (primary) hypertension: Secondary | ICD-10-CM | POA: Diagnosis not present

## 2023-10-28 DIAGNOSIS — E785 Hyperlipidemia, unspecified: Secondary | ICD-10-CM | POA: Diagnosis not present

## 2023-11-21 ENCOUNTER — Encounter: Payer: Self-pay | Admitting: Vascular Surgery

## 2023-11-21 ENCOUNTER — Ambulatory Visit (HOSPITAL_COMMUNITY)
Admission: RE | Admit: 2023-11-21 | Discharge: 2023-11-21 | Disposition: A | Discharge status: Home / Self Care | Payer: Medicare Other | Source: Ambulatory Visit | Attending: Vascular Surgery | Admitting: Vascular Surgery

## 2023-11-21 ENCOUNTER — Ambulatory Visit (INDEPENDENT_AMBULATORY_CARE_PROVIDER_SITE_OTHER)
Admission: RE | Admit: 2023-11-21 | Discharge: 2023-11-21 | Disposition: A | Discharge status: Home / Self Care | Payer: Medicare Other | Source: Ambulatory Visit | Attending: Vascular Surgery | Admitting: Vascular Surgery

## 2023-11-21 ENCOUNTER — Ambulatory Visit: Payer: Medicare Other | Admitting: Vascular Surgery

## 2023-11-21 VITALS — BP 164/76 | HR 79 | Temp 98.2°F | Resp 20 | Ht 64.0 in | Wt 118.6 lb

## 2023-11-21 DIAGNOSIS — I771 Stricture of artery: Secondary | ICD-10-CM | POA: Diagnosis not present

## 2023-11-21 DIAGNOSIS — I739 Peripheral vascular disease, unspecified: Secondary | ICD-10-CM

## 2023-11-21 DIAGNOSIS — Z95828 Presence of other vascular implants and grafts: Secondary | ICD-10-CM

## 2023-11-21 LAB — VAS US ABI WITH/WO TBI
Left ABI: 0.8
Right ABI: 1.08

## 2023-11-21 NOTE — Progress Notes (Signed)
 Office Note     CC:  follow up Requesting Provider:  [name], MD  HPI: Thomas Ferrell is a 83 y.o. (08-15-1941) male who presents for follow up of PAD and carotid artery stenosis. He has remote history of bilateral common femoral endarterectomies, right to left femoral-femoral bypass and left femoral to above knee popliteal bypass with Propaten on 12/09/13 by Dr. Harvey for ischemic left leg. He has done well since his surgeries with minimal lower extremity symptoms. He does have a left great toe drop that he wears a brace for. This is secondary to a history of a spinal injury, and uses a walker to ambulate.  Most recent intervention was right EIA drug-coated balloon angioplasty for elevated inflow velocities providing perfusion to the left lower extremity through femorofemoral and left-sided femoral-popliteal bypass.  He was seen 3 months ago, and was offered surgical revision of the right groin to improve external iliac inflow.  He declined, and elected for continued medical management.  On exam today, he was doing well, accompanied by his wife.  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  This is worse in his left leg.  Denies claudication, ischemic rest pain, tissue loss.   Remote history of right CEA on 10/21/06 by Dr. Harvey for symptomatic carotid stenosis. Today he denies changes in vision, slurred speech, facial drooping, unilateral upper or lower extremity weakness or numbness.    The pt is on a statin for cholesterol management.  The pt is on a daily aspirin .   Other AC:  none The pt is on CCB, ACE for hypertension.   The pt is not diabetic.   Tobacco hx:  former, 2007  Past Medical History:  Diagnosis Date   Abnormality of gait 03/03/2013   bladder ca dx'd 11/2009   chemo/xrt comp 02/2010   Cerebrovascular disease    right brain CVA 2007   COPD (chronic obstructive pulmonary disease) (HCC)    Cough productive of clear sputum     TX RECENT SINUS INFECTION    CVA (cerebral vascular accident) (HCC) 2007   r-cva   Dyslipidemia    Hypertension    Lumbago     Past Surgical History:  Procedure Laterality Date   ABDOMINAL AORTAGRAM N/A 12/07/2013   Procedure: ABDOMINAL EZELLA;  Surgeon: Lonni GORMAN Blade, MD;  Location: [facility] CATH LAB;  Service: Cardiovascular;  Laterality: N/A;   ABDOMINAL AORTOGRAM W/LOWER EXTREMITY N/A 06/26/2023   Procedure: ABDOMINAL AORTOGRAM W/LOWER EXTREMITY;  Surgeon: Lanis Fonda BRAVO, MD;  Location: [facility] INVASIVE CV LAB;  Service: Cardiovascular;  Laterality: N/A;   BACK SURGERY  2010   bladder cancer     CAROTID ARTERY ANGIOPLASTY  2009   CHOLECYSTECTOMY N/A 06/06/2016   Procedure: LAPAROSCOPIC CHOLECYSTECTOMY WITH INTRAOPERATIVE CHOLANGIOGRAM;  Surgeon: Krystal Spinner, MD;  Location: [facility] ORS;  Service: General;  Laterality: N/A;   ERCP N/A 06/05/2016   Procedure: ENDOSCOPIC RETROGRADE CHOLANGIOPANCREATOGRAPHY (ERCP);  Surgeon: Norleen Hint, MD;  Location: [facility];  Service: Endoscopy;  Laterality: N/A;   ESOPHAGOGASTRODUODENOSCOPY (EGD) WITH PROPOFOL  N/A 06/05/2016   Procedure: ESOPHAGOGASTRODUODENOSCOPY (EGD);  Surgeon: Norleen Hint, MD;  Location: [facility];  Service: Endoscopy;  Laterality: N/A;   FEMORAL-FEMORAL BYPASS GRAFT N/A 12/09/2013   Procedure: BYPASS GRAFT FEMORAL-FEMORAL ARTERY WITH BILATERAL ENDARTERECTOMY ;  Surgeon: Carlin BRAVO Harvey, MD;  Location: [facility] OR;  Service: Vascular;  Laterality: N/A;   FEMORAL-POPLITEAL BYPASS GRAFT Left 12/09/2013   Procedure: BYPASS GRAFT FEMORAL-POPLITEAL ARTERY -  LEFT ;  Surgeon: Carlin FORBES Haddock, MD;  Location: [facility] OR;  Service: Vascular;  Laterality: Left;   LAMINECTOMY  09/08/2013   L 2 L3 L4 L5       Dr Amon   LOWER EXTREMITY ANGIOGRAPHY Right 07/03/2023   Procedure: Lower Extremity Angiography;  Surgeon: Lanis Fonda FORBES, MD;  Location: [facility] INVASIVE CV LAB;  Service: Cardiovascular;  Laterality: Right;   PERIPHERAL VASCULAR BALLOON ANGIOPLASTY  Right 07/03/2023   Procedure: PERIPHERAL VASCULAR BALLOON ANGIOPLASTY;  Surgeon: Lanis Fonda FORBES, MD;  Location: [facility] INVASIVE CV LAB;  Service: Cardiovascular;  Laterality: Right;  R external iliac    Social History   Socioeconomic History   Marital status: Married    Spouse name: Not on file   Number of children: 2   Years of education: College   Highest education level: Not on file  Occupational History    Employer: RETIRED    Comment: Retired  Tobacco Use   Smoking status: Former    Current packs/day: 0.00    Types: Cigarettes    Quit date: 11/12/2005    Years since quitting: 18.0    Passive exposure: Never   Smokeless tobacco: Never  Vaping Use   Vaping status: Never Used  Substance and Sexual Activity   Alcohol  use: No   Drug use: No   Sexual activity: Not on file  Other Topics Concern   Not on file  Social History Narrative   Not on file   Social Drivers of Health   Financial Resource Strain: Not on file  Food Insecurity: Not on file  Transportation Needs: Not on file  Physical Activity: Not on file  Stress: Not on file  Social Connections: Not on file  Intimate Partner Violence: Not on file    Family History  Problem Relation Age of Onset   Stroke Mother    Cancer Father    Dementia Father    Cancer Sister     Current Outpatient Medications  Medication Sig Dispense Refill   acetaminophen  (TYLENOL ) 500 MG tablet Take 1,000 mg by mouth every 6 (six) hours as needed for mild pain or headache.      amLODipine  (NORVASC ) 10 MG tablet Take 10 mg by mouth daily.     aspirin  EC 81 MG tablet Take 81 mg by mouth daily. Swallow whole.     atorvastatin  (LIPITOR) 20 MG tablet Take 20 mg by mouth daily.     clopidogrel  (PLAVIX ) 75 MG tablet Take 1 tablet (75 mg total) by mouth daily. 30 tablet 11   ketotifen  (ZADITOR ) 0.035 % ophthalmic solution Place 1 drop into both eyes daily as needed (Dry eye).     lisinopril  (PRINIVIL ,ZESTRIL ) 40 MG tablet Take 40 mg by mouth  daily.  5   sertraline  (ZOLOFT ) 50 MG tablet Take 50 mg by mouth daily.     vitamin B-12 (CYANOCOBALAMIN ) 500 MCG tablet Take 500 mcg by mouth daily.     No current facility-administered medications for this visit.    Allergies  Allergen Reactions   Hydrocodone  Other (See Comments)    hallucinations   Morphine  And Codeine Itching     REVIEW OF SYSTEMS:  [X]  denotes positive finding, [ ]  denotes negative finding Cardiac  Comments:  Chest pain or chest pressure:    Shortness of breath upon exertion:    Short of breath when lying flat:    Irregular heart rhythm:        Vascular    Pain in  calf, thigh, or hip brought on by ambulation:    Pain in feet at night that wakes you up from your sleep:     Blood clot in your veins:    Leg swelling:         Pulmonary    Oxygen  at home:    Productive cough:     Wheezing:         Neurologic    Sudden weakness in arms or legs:     Sudden numbness in arms or legs:     Sudden onset of difficulty speaking or slurred speech:    Temporary loss of vision in one eye:     Problems with dizziness:         Gastrointestinal    Blood in stool:     Vomited blood:         Genitourinary    Burning when urinating:     Blood in urine:        Psychiatric    Major depression:         Hematologic    Bleeding problems:    Problems with blood clotting too easily:        Skin    Rashes or ulcers:        Constitutional    Fever or chills:      PHYSICAL EXAMINATION:  Vitals:   11/21/23 1433  BP: (!) 164/76  Pulse: 79  Resp: 20  Temp: 98.2 F (36.8 C)  SpO2: 95%  Weight: 118 lb 9.6 oz (53.8 kg)  Height: 5' 4 (1.626 m)     General:  WDWN in NAD; vital signs documented above Gait: Ambulates with walker HENT: WNL, normocephalic Pulmonary: normal non-labored breathing  Cardiac: regular HR Abdomen: soft, NT Vascular Exam/Pulses: 2+ femoral pulse, palpable pulse in fem- fem bypass. Brisk doppler DP/PT signals  bilaterally Extremities: without ischemic changes, without Gangrene , without cellulitis; without open wounds; multiple healed scars on LLE Musculoskeletal: no muscle wasting or atrophy  Neurologic: A&O X 3 Psychiatric:  The pt has  affect.   Non-Invasive Vascular Imaging:    Left Graft #1: fem-pop  +--------------------+--------+--------+--------+--------+                     PSV cm/sStenosisWaveformComments  +--------------------+--------+--------+--------+--------+  Inflow             79              biphasic          +--------------------+--------+--------+--------+--------+  Proximal Anastomosis49              biphasic          +--------------------+--------+--------+--------+--------+  Proximal Graft      48              biphasic          +--------------------+--------+--------+--------+--------+  Mid Graft           43              biphasic          +--------------------+--------+--------+--------+--------+  Distal Graft        46              biphasic          +--------------------+--------+--------+--------+--------+  Distal Anastomosis  92              biphasic          +--------------------+--------+--------+--------+--------+  Outflow  86              biphasic          +--------------------+--------+--------+--------+--------+    Right Graft #1: fem-fem  +------------------+--------+---------------+----------+--------+                   PSV cm/sStenosis       Waveform  Comments  +------------------+--------+---------------+----------+--------+  Inflow           405     75-99% stenosismonophasic          +------------------+--------+---------------+----------+--------+  Prox Anastomosis  236     50-70% stenosismonophasic          +------------------+--------+---------------+----------+--------+  Proximal Graft    58                     monophasic           +------------------+--------+---------------+----------+--------+  Mid Graft         35                     monophasic          +------------------+--------+---------------+----------+--------+  Distal Graft      45                     biphasic            +------------------+--------+---------------+----------+--------+  Distal Anastomosis53                     biphasic            +------------------+--------+---------------+----------+--------+  Outflow          79                     biphasic            +------------------+--------+---------------+----------+--------+    +-------+-----------+-----------+------------+------------+  ABI/TBIToday's ABIToday's TBIPrevious ABIPrevious TBI  +-------+-----------+-----------+------------+------------+  Right 1.08       0.58       0.64        0.33          +-------+-----------+-----------+------------+------------+  Left  0.80       0.66       0.78        0.34          +-------+-----------+-----------+------------+------------+    Summary:  Left: Patent left to right fem-fem bypass with 75-99% inflow stenosis.  Patent fem-BK pop bypass graft with no stenosis identified.   ASSESSMENT/PLAN:: 83 y.o. male here for follow up for PAD and carotid artery disease. He has remote history of bilateral common femoral endarterectomies, right to left femoral-femoral bypass and left femoral to above knee popliteal bypass with Propaten on 12/09/13 by Dr. Harvey for ischemic left leg. He currently is without any claudication, rest pain or tissue loss. However he does not really ambulate enough to elicit any claudication symptoms.   On physical exam, he has palpable femoral pulses and an excellent pulse in the femoral-femoral bypass. Bypass velocities and ABIs are stable from previous. We had a long discussion regarding surgical versus medical management.  I again offered right sided iliofemoral endarterectomy with  possible retrograde stenting, but we also discussed that the velocities and ABIs are unchanged in the femoral-femoral bypass, right lower extremity bypass. After discussing surgical versus medical management, Thomas Ferrell elected to continue medical management.  My plan is to continue short interval follow-up in 3 months.  Should he have no issues with  stable velocities, we will push this to 6 months at his next visit.  He is aware that should the external iliac artery worsen he is at risk of femoral-femoral bypass and right sided femoral to below-knee popliteal artery bypass occlusion, which will lead to risk of limb loss.  He is aware I cannot promise that this does not happen over the next 3 months.   Current medication regimen includes aspirin , Plavix , statin.  Asked that he continue these and call immediately should any new symptoms arise.  Fonda FORBES [facility] [phone]

## 2023-12-03 ENCOUNTER — Other Ambulatory Visit: Payer: Self-pay

## 2023-12-03 DIAGNOSIS — I70239 Atherosclerosis of native arteries of right leg with ulceration of unspecified site: Secondary | ICD-10-CM

## 2023-12-03 DIAGNOSIS — I739 Peripheral vascular disease, unspecified: Secondary | ICD-10-CM

## 2023-12-12 DIAGNOSIS — M533 Sacrococcygeal disorders, not elsewhere classified: Secondary | ICD-10-CM | POA: Diagnosis not present

## 2023-12-12 DIAGNOSIS — M545 Low back pain, unspecified: Secondary | ICD-10-CM | POA: Diagnosis not present

## 2023-12-18 DIAGNOSIS — M533 Sacrococcygeal disorders, not elsewhere classified: Secondary | ICD-10-CM | POA: Diagnosis not present

## 2023-12-20 DIAGNOSIS — M545 Low back pain, unspecified: Secondary | ICD-10-CM | POA: Diagnosis not present

## 2023-12-20 DIAGNOSIS — M533 Sacrococcygeal disorders, not elsewhere classified: Secondary | ICD-10-CM | POA: Diagnosis not present

## 2024-01-19 DIAGNOSIS — M545 Low back pain, unspecified: Secondary | ICD-10-CM | POA: Diagnosis not present

## 2024-01-24 DIAGNOSIS — M5416 Radiculopathy, lumbar region: Secondary | ICD-10-CM | POA: Diagnosis not present

## 2024-01-24 DIAGNOSIS — M48062 Spinal stenosis, lumbar region with neurogenic claudication: Secondary | ICD-10-CM | POA: Diagnosis not present

## 2024-02-13 ENCOUNTER — Encounter (HOSPITAL_COMMUNITY): Payer: Medicare Other

## 2024-02-19 ENCOUNTER — Ambulatory Visit (HOSPITAL_COMMUNITY)
Admission: RE | Admit: 2024-02-19 | Discharge: 2024-02-19 | Disposition: A | Discharge status: Home / Self Care | Source: Ambulatory Visit | Attending: Vascular Surgery | Admitting: Vascular Surgery

## 2024-02-19 DIAGNOSIS — I70239 Atherosclerosis of native arteries of right leg with ulceration of unspecified site: Secondary | ICD-10-CM | POA: Diagnosis not present

## 2024-02-19 DIAGNOSIS — I739 Peripheral vascular disease, unspecified: Secondary | ICD-10-CM | POA: Insufficient documentation

## 2024-02-19 NOTE — Progress Notes (Unsigned)
 Office Note    HPI: Thomas Ferrell is a 83 y.o. (1941/04/30) male who presents for follow up of PAD and carotid artery stenosis. He has remote history of bilateral common femoral endarterectomies, right to left femoral-femoral bypass and left femoral to above knee popliteal bypass with Propaten on 12/09/13 by Dr. Darrick Penna for ischemic left leg. He has done well since his surgeries with minimal lower extremity symptoms. He does have a left great toe drop that he wears a brace for. This is secondary to a history of a spinal injury, and uses a walker to ambulate.  Most recent intervention was right EIA drug-coated balloon angioplasty for elevated inflow velocities providing perfusion to the left lower extremity through femorofemoral and left-sided femoral-popliteal bypass in 2024.  He was seen 3 months ago, and was offered surgical revision of the right groin to improve external iliac inflow.  He declined, and elected for continued medical management.  On exam today, he was doing well, accompanied by his wife.  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  He does not want to pursue another spine surgery.  He is able to ambulate a few feet, but is otherwise in the chair.   Denies claudication, ischemic rest pain, tissue loss.   Remote history of right CEA on 10/21/06 by Dr. Darrick Penna for symptomatic carotid stenosis. Today he denies changes in vision, slurred speech, facial drooping, unilateral upper or lower extremity weakness or numbness.    The pt is on a statin for cholesterol management.  The pt is on a daily aspirin.   Other AC:  none The pt is on CCB, ACE for hypertension.   The pt is not diabetic.   Tobacco hx:  former, 2007  Past Medical History:  Diagnosis Date   Abnormality of gait 03/03/2013   bladder ca dx'd 11/2009   chemo/xrt comp 02/2010   Cerebrovascular disease    right brain CVA 2007   COPD (chronic obstructive pulmonary disease) (HCC)     Cough productive of clear sputum    TX RECENT SINUS INFECTION    CVA (cerebral vascular accident) (HCC) 2007   r-cva   Dyslipidemia    Hypertension    Lumbago     Past Surgical History:  Procedure Laterality Date   ABDOMINAL AORTAGRAM N/A 12/07/2013   Procedure: ABDOMINAL Ronny Flurry;  Surgeon: Chuck Hint, MD;  Location: [facility] CATH LAB;  Service: Cardiovascular;  Laterality: N/A;   ABDOMINAL AORTOGRAM W/LOWER EXTREMITY N/A 06/26/2023   Procedure: ABDOMINAL AORTOGRAM W/LOWER EXTREMITY;  Surgeon: Victorino Sparrow, MD;  Location: [facility] INVASIVE CV LAB;  Service: Cardiovascular;  Laterality: N/A;   BACK SURGERY  2010   bladder cancer     CAROTID ARTERY ANGIOPLASTY  2009   CHOLECYSTECTOMY N/A 06/06/2016   Procedure: LAPAROSCOPIC CHOLECYSTECTOMY WITH INTRAOPERATIVE CHOLANGIOGRAM;  Surgeon: Darnell Level, MD;  Location: [facility] ORS;  Service: General;  Laterality: N/A;   ERCP N/A 06/05/2016   Procedure: ENDOSCOPIC RETROGRADE CHOLANGIOPANCREATOGRAPHY (ERCP);  Surgeon: Dorena Cookey, MD;  Location: [facility] ENDOSCOPY;  Service: Endoscopy;  Laterality: N/A;   ESOPHAGOGASTRODUODENOSCOPY (EGD) WITH PROPOFOL N/A 06/05/2016   Procedure: ESOPHAGOGASTRODUODENOSCOPY (EGD);  Surgeon: Dorena Cookey, MD;  Location: [facility] ENDOSCOPY;  Service: Endoscopy;  Laterality: N/A;   FEMORAL-FEMORAL BYPASS GRAFT N/A 12/09/2013   Procedure: BYPASS GRAFT FEMORAL-FEMORAL ARTERY WITH BILATERAL ENDARTERECTOMY ;  Surgeon: Sherren Kerns, MD;  Location: [facility] OR;  Service: Vascular;  Laterality: N/A;   FEMORAL-POPLITEAL BYPASS GRAFT Left 12/09/2013  Procedure: BYPASS GRAFT FEMORAL-POPLITEAL ARTERY - LEFT ;  Surgeon: Sherren Kerns, MD;  Location: [facility] OR;  Service: Vascular;  Laterality: Left;   LAMINECTOMY  09/08/2013   L 2 L3 L4 L5       Dr Phoebe Perch   LOWER EXTREMITY ANGIOGRAPHY Right 07/03/2023   Procedure: Lower Extremity Angiography;  Surgeon: Victorino Sparrow, MD;  Location: [facility] INVASIVE CV LAB;  Service: Cardiovascular;  Laterality: Right;    PERIPHERAL VASCULAR BALLOON ANGIOPLASTY Right 07/03/2023   Procedure: PERIPHERAL VASCULAR BALLOON ANGIOPLASTY;  Surgeon: Victorino Sparrow, MD;  Location: [facility] INVASIVE CV LAB;  Service: Cardiovascular;  Laterality: Right;  R external iliac    Social History   Socioeconomic History   Marital status: Married    Spouse name: Not on file   Number of children: 2   Years of education: College   Highest education level: Not on file  Occupational History    Employer: RETIRED    Comment: Retired  Tobacco Use   Smoking status: Former    Current packs/day: 0.00    Types: Cigarettes    Quit date: 11/12/2005    Years since quitting: 18.2    Passive exposure: Never   Smokeless tobacco: Never  Vaping Use   Vaping status: Never Used  Substance and Sexual Activity   Alcohol use: No   Drug use: No   Sexual activity: Not on file  Other Topics Concern   Not on file  Social History Narrative   Not on file   Social Drivers of Health   Financial Resource Strain: Not on file  Food Insecurity: Not on file  Transportation Needs: Not on file  Physical Activity: Not on file  Stress: Not on file  Social Connections: Not on file  Intimate Partner Violence: Not on file    Family History  Problem Relation Age of Onset   Stroke Mother    Cancer Father    Dementia Father    Cancer Sister     Current Outpatient Medications  Medication Sig Dispense Refill   acetaminophen (TYLENOL) 500 MG tablet Take 1,000 mg by mouth every 6 (six) hours as needed for mild pain or headache.      amLODipine (NORVASC) 10 MG tablet Take 10 mg by mouth daily.     aspirin EC 81 MG tablet Take 81 mg by mouth daily. Swallow whole.     atorvastatin (LIPITOR) 20 MG tablet Take 20 mg by mouth daily.     clopidogrel (PLAVIX) 75 MG tablet Take 1 tablet (75 mg total) by mouth daily. 30 tablet 11   ketotifen (ZADITOR) 0.035 % ophthalmic solution Place 1 drop into both eyes daily as needed (Dry eye).     lisinopril  (PRINIVIL,ZESTRIL) 40 MG tablet Take 40 mg by mouth daily.  5   sertraline (ZOLOFT) 50 MG tablet Take 50 mg by mouth daily.     vitamin B-12 (CYANOCOBALAMIN) 500 MCG tablet Take 500 mcg by mouth daily.     No current facility-administered medications for this visit.    Allergies  Allergen Reactions   Hydrocodone Other (See Comments)    hallucinations   Morphine And Codeine Itching     REVIEW OF SYSTEMS:  [X]  denotes positive finding, [ ]  denotes negative finding Cardiac  Comments:  Chest pain or chest pressure:    Shortness of breath upon exertion:    Short of breath when lying flat:    Irregular heart rhythm:  Vascular    Pain in calf, thigh, or hip brought on by ambulation:    Pain in feet at night that wakes you up from your sleep:     Blood clot in your veins:    Leg swelling:         Pulmonary    Oxygen at home:    Productive cough:     Wheezing:         Neurologic    Sudden weakness in arms or legs:     Sudden numbness in arms or legs:     Sudden onset of difficulty speaking or slurred speech:    Temporary loss of vision in one eye:     Problems with dizziness:         Gastrointestinal    Blood in stool:     Vomited blood:         Genitourinary    Burning when urinating:     Blood in urine:        Psychiatric    Major depression:         Hematologic    Bleeding problems:    Problems with blood clotting too easily:        Skin    Rashes or ulcers:        Constitutional    Fever or chills:      PHYSICAL EXAMINATION:  There were no vitals filed for this visit.    General:  WDWN in NAD; vital signs documented above Gait: Ambulates with walker HENT: WNL, normocephalic Pulmonary: normal non-labored breathing  Cardiac: regular HR Abdomen: soft, NT Vascular Exam/Pulses: 2+ femoral pulse, palpable pulse in fem- fem bypass. Brisk doppler DP/PT signals bilaterally Extremities: without ischemic changes, without Gangrene , without cellulitis;  without open wounds; multiple healed scars on LLE Musculoskeletal: no muscle wasting or atrophy  Neurologic: A&O X 3 Psychiatric:  The pt has  affect.   Non-Invasive Vascular Imaging:    Abdominal Aorta Findings:  +-------------+-------+----------+----------+--------+--------+--------+  Location    AP (cm)Trans (cm)PSV (cm/s)WaveformThrombusComments  +-------------+-------+----------+----------+--------+--------+--------+  RT EIA Prox                   295                                 +-------------+-------+----------+----------+--------+--------+--------+  RT EIA Mid                    294                                 +-------------+-------+----------+----------+--------+--------+--------+  RT EIA Distal                 316                                 +-------------+-------+----------+----------+--------+--------+--------+   Left Graft #1: fem-pop  +--------------------+--------+--------+--------+--------+                     PSV cm/sStenosisWaveformComments  +--------------------+--------+--------+--------+--------+  Inflow             79              biphasic          +--------------------+--------+--------+--------+--------+  Proximal Anastomosis49  biphasic          +--------------------+--------+--------+--------+--------+  Proximal Graft      48              biphasic          +--------------------+--------+--------+--------+--------+  Mid Graft           43              biphasic          +--------------------+--------+--------+--------+--------+  Distal Graft        46              biphasic          +--------------------+--------+--------+--------+--------+  Distal Anastomosis  92              biphasic          +--------------------+--------+--------+--------+--------+  Outflow            86              biphasic           +--------------------+--------+--------+--------+--------+    Right Graft #1: fem-fem  +------------------+--------+---------------+----------+--------+                   PSV cm/sStenosis       Waveform  Comments  +------------------+--------+---------------+----------+--------+  Inflow           405     75-99% stenosismonophasic          +------------------+--------+---------------+----------+--------+  Prox Anastomosis  236     50-70% stenosismonophasic          +------------------+--------+---------------+----------+--------+  Proximal Graft    58                     monophasic          +------------------+--------+---------------+----------+--------+  Mid Graft         35                     monophasic          +------------------+--------+---------------+----------+--------+  Distal Graft      45                     biphasic            +------------------+--------+---------------+----------+--------+  Distal Anastomosis53                     biphasic            +------------------+--------+---------------+----------+--------+  Outflow          79                     biphasic            +------------------+--------+---------------+----------+--------+    +-------+-----------+-----------+------------+------------+  ABI/TBIToday's ABIToday's TBIPrevious ABIPrevious TBI  +-------+-----------+-----------+------------+------------+  Right 1.08       0.58       0.64        0.33          +-------+-----------+-----------+------------+------------+  Left  0.80       0.66       0.78        0.34          +-------+-----------+-----------+------------+------------+    Summary:  Left: Patent left to right fem-fem bypass with 75-99% inflow stenosis.  Patent fem-BK pop bypass graft with no stenosis identified.   ASSESSMENT/PLAN:: 83 y.o. male here for follow up for PAD  and carotid artery disease. He has remote history of  bilateral common femoral endarterectomies, right to left femoral-femoral bypass and left femoral to above knee popliteal bypass with Propaten on 12/09/13 by Dr. Darrick Penna for ischemic left leg. He currently is without any claudication, rest pain or tissue loss. However he does not really ambulate enough to elicit any claudication symptoms.   On physical exam, he has palpable femoral pulses and an excellent pulse in the femoral-femoral bypass.  Bypass velocities and ABIs are stable from previous, with a slight elevation in inflow velocity to 425.  He was taken to angiography when velocities were 400, and found to have some gradient which was balloon angioplastied.  This had little improvement at his next visit.  He is not in the low 400s for over 6 months.  Similar to his last visit, Jakim and I discussed surgical management versus continued medical management.  I again offered right sided iliofemoral endarterectomy with possible retrograde stenting, especially being that there had been a slight increase in velocity.  He was not interested in any further surgery at this time. He elected to continue medical management.  My plan is to continue short interval follow-up in 3 months.  Should he have a significant increase in velocity, I would again offer intervention.  He is aware that the stenosis appreciated in the right external iliac affects bilateral lower extremities including the right to left femoral-femoral bypass and femoral popliteal bypass in the left leg.  He is aware that should the external iliac artery worsen he is at risk of femoral-femoral bypass and right sided femoral to below-knee popliteal artery bypass occlusion, which will lead to risk of limb loss.   Current medication regimen includes aspirin, Plavix, statin.  Asked that he continue these and call immediately should any new symptoms arise.  Victorino Sparrow [facility] [phone]

## 2024-02-20 ENCOUNTER — Ambulatory Visit (INDEPENDENT_AMBULATORY_CARE_PROVIDER_SITE_OTHER)
Admission: RE | Admit: 2024-02-20 | Discharge: 2024-02-20 | Disposition: A | Discharge status: Home / Self Care | Payer: Medicare Other | Source: Ambulatory Visit | Attending: Vascular Surgery

## 2024-02-20 ENCOUNTER — Encounter (HOSPITAL_COMMUNITY): Payer: Medicare Other

## 2024-02-20 ENCOUNTER — Ambulatory Visit (HOSPITAL_COMMUNITY)
Admission: RE | Admit: 2024-02-20 | Discharge: 2024-02-20 | Disposition: A | Discharge status: Home / Self Care | Payer: Medicare Other | Source: Ambulatory Visit | Attending: Vascular Surgery | Admitting: Vascular Surgery

## 2024-02-20 ENCOUNTER — Encounter: Payer: Self-pay | Admitting: Vascular Surgery

## 2024-02-20 ENCOUNTER — Ambulatory Visit: Payer: Medicare Other | Admitting: Vascular Surgery

## 2024-02-20 VITALS — BP 165/65 | HR 85 | Temp 98.3°F | Resp 18 | Ht 64.0 in | Wt 118.0 lb

## 2024-02-20 DIAGNOSIS — I70239 Atherosclerosis of native arteries of right leg with ulceration of unspecified site: Secondary | ICD-10-CM

## 2024-02-20 DIAGNOSIS — I739 Peripheral vascular disease, unspecified: Secondary | ICD-10-CM | POA: Insufficient documentation

## 2024-02-20 DIAGNOSIS — I700 Atherosclerosis of aorta: Secondary | ICD-10-CM

## 2024-02-20 DIAGNOSIS — Z95828 Presence of other vascular implants and grafts: Secondary | ICD-10-CM | POA: Diagnosis not present

## 2024-02-20 DIAGNOSIS — Z9889 Other specified postprocedural states: Secondary | ICD-10-CM

## 2024-02-20 DIAGNOSIS — I771 Stricture of artery: Secondary | ICD-10-CM | POA: Diagnosis not present

## 2024-02-20 LAB — VAS US ABI WITH/WO TBI
Left ABI: 0.73
Right ABI: 1.07

## 2024-02-21 ENCOUNTER — Other Ambulatory Visit: Payer: Self-pay | Admitting: *Deleted

## 2024-02-21 DIAGNOSIS — I739 Peripheral vascular disease, unspecified: Secondary | ICD-10-CM

## 2024-02-21 DIAGNOSIS — I771 Stricture of artery: Secondary | ICD-10-CM

## 2024-02-21 DIAGNOSIS — Z95828 Presence of other vascular implants and grafts: Secondary | ICD-10-CM

## 2024-03-11 DIAGNOSIS — M533 Sacrococcygeal disorders, not elsewhere classified: Secondary | ICD-10-CM | POA: Diagnosis not present

## 2024-03-11 DIAGNOSIS — J449 Chronic obstructive pulmonary disease, unspecified: Secondary | ICD-10-CM | POA: Diagnosis not present

## 2024-03-11 DIAGNOSIS — M4726 Other spondylosis with radiculopathy, lumbar region: Secondary | ICD-10-CM | POA: Diagnosis not present

## 2024-03-11 DIAGNOSIS — M48062 Spinal stenosis, lumbar region with neurogenic claudication: Secondary | ICD-10-CM | POA: Diagnosis not present

## 2024-03-11 DIAGNOSIS — Z7982 Long term (current) use of aspirin: Secondary | ICD-10-CM | POA: Diagnosis not present

## 2024-03-11 DIAGNOSIS — Z556 Problems related to health literacy: Secondary | ICD-10-CM | POA: Diagnosis not present

## 2024-03-11 DIAGNOSIS — M19031 Primary osteoarthritis, right wrist: Secondary | ICD-10-CM | POA: Diagnosis not present

## 2024-03-11 DIAGNOSIS — E7800 Pure hypercholesterolemia, unspecified: Secondary | ICD-10-CM | POA: Diagnosis not present

## 2024-03-11 DIAGNOSIS — G8929 Other chronic pain: Secondary | ICD-10-CM | POA: Diagnosis not present

## 2024-03-11 DIAGNOSIS — Z7902 Long term (current) use of antithrombotics/antiplatelets: Secondary | ICD-10-CM | POA: Diagnosis not present

## 2024-03-17 ENCOUNTER — Encounter (HOSPITAL_COMMUNITY): Payer: Self-pay

## 2024-03-17 ENCOUNTER — Other Ambulatory Visit: Payer: Self-pay

## 2024-03-17 ENCOUNTER — Inpatient Hospital Stay (HOSPITAL_COMMUNITY)
Admission: EM | Admit: 2024-03-17 | Discharge: 2024-03-19 | DRG: 291 | Disposition: A | Discharge status: Home Health | Attending: Internal Medicine | Admitting: Internal Medicine

## 2024-03-17 ENCOUNTER — Emergency Department (HOSPITAL_COMMUNITY)

## 2024-03-17 DIAGNOSIS — I509 Heart failure, unspecified: Secondary | ICD-10-CM | POA: Diagnosis not present

## 2024-03-17 DIAGNOSIS — Z79899 Other long term (current) drug therapy: Secondary | ICD-10-CM

## 2024-03-17 DIAGNOSIS — E876 Hypokalemia: Secondary | ICD-10-CM | POA: Diagnosis not present

## 2024-03-17 DIAGNOSIS — I739 Peripheral vascular disease, unspecified: Secondary | ICD-10-CM | POA: Diagnosis present

## 2024-03-17 DIAGNOSIS — J441 Chronic obstructive pulmonary disease with (acute) exacerbation: Secondary | ICD-10-CM | POA: Diagnosis not present

## 2024-03-17 DIAGNOSIS — R0602 Shortness of breath: Secondary | ICD-10-CM

## 2024-03-17 DIAGNOSIS — E785 Hyperlipidemia, unspecified: Secondary | ICD-10-CM | POA: Diagnosis not present

## 2024-03-17 DIAGNOSIS — D649 Anemia, unspecified: Secondary | ICD-10-CM | POA: Insufficient documentation

## 2024-03-17 DIAGNOSIS — I480 Paroxysmal atrial fibrillation: Secondary | ICD-10-CM | POA: Diagnosis not present

## 2024-03-17 DIAGNOSIS — G629 Polyneuropathy, unspecified: Secondary | ICD-10-CM | POA: Diagnosis present

## 2024-03-17 DIAGNOSIS — Z809 Family history of malignant neoplasm, unspecified: Secondary | ICD-10-CM | POA: Diagnosis not present

## 2024-03-17 DIAGNOSIS — R600 Localized edema: Secondary | ICD-10-CM

## 2024-03-17 DIAGNOSIS — R0902 Hypoxemia: Secondary | ICD-10-CM | POA: Diagnosis not present

## 2024-03-17 DIAGNOSIS — Z1152 Encounter for screening for COVID-19: Secondary | ICD-10-CM

## 2024-03-17 DIAGNOSIS — Z823 Family history of stroke: Secondary | ICD-10-CM

## 2024-03-17 DIAGNOSIS — Z7902 Long term (current) use of antithrombotics/antiplatelets: Secondary | ICD-10-CM | POA: Diagnosis not present

## 2024-03-17 DIAGNOSIS — Z7982 Long term (current) use of aspirin: Secondary | ICD-10-CM | POA: Diagnosis not present

## 2024-03-17 DIAGNOSIS — I272 Pulmonary hypertension, unspecified: Secondary | ICD-10-CM | POA: Diagnosis not present

## 2024-03-17 DIAGNOSIS — I10 Essential (primary) hypertension: Secondary | ICD-10-CM | POA: Diagnosis not present

## 2024-03-17 DIAGNOSIS — Z885 Allergy status to narcotic agent status: Secondary | ICD-10-CM

## 2024-03-17 DIAGNOSIS — Z7901 Long term (current) use of anticoagulants: Secondary | ICD-10-CM

## 2024-03-17 DIAGNOSIS — R062 Wheezing: Secondary | ICD-10-CM | POA: Diagnosis not present

## 2024-03-17 DIAGNOSIS — R918 Other nonspecific abnormal finding of lung field: Secondary | ICD-10-CM | POA: Diagnosis not present

## 2024-03-17 DIAGNOSIS — I5033 Acute on chronic diastolic (congestive) heart failure: Secondary | ICD-10-CM | POA: Diagnosis present

## 2024-03-17 DIAGNOSIS — Z8673 Personal history of transient ischemic attack (TIA), and cerebral infarction without residual deficits: Secondary | ICD-10-CM

## 2024-03-17 DIAGNOSIS — I5031 Acute diastolic (congestive) heart failure: Secondary | ICD-10-CM | POA: Diagnosis not present

## 2024-03-17 DIAGNOSIS — I4891 Unspecified atrial fibrillation: Principal | ICD-10-CM | POA: Diagnosis present

## 2024-03-17 DIAGNOSIS — Z87891 Personal history of nicotine dependence: Secondary | ICD-10-CM | POA: Diagnosis not present

## 2024-03-17 DIAGNOSIS — J984 Other disorders of lung: Secondary | ICD-10-CM | POA: Diagnosis not present

## 2024-03-17 DIAGNOSIS — Z8551 Personal history of malignant neoplasm of bladder: Secondary | ICD-10-CM

## 2024-03-17 DIAGNOSIS — I110 Hypertensive heart disease with heart failure: Secondary | ICD-10-CM | POA: Diagnosis not present

## 2024-03-17 DIAGNOSIS — Z95820 Peripheral vascular angioplasty status with implants and grafts: Secondary | ICD-10-CM

## 2024-03-17 DIAGNOSIS — J810 Acute pulmonary edema: Secondary | ICD-10-CM | POA: Diagnosis not present

## 2024-03-17 DIAGNOSIS — Z7951 Long term (current) use of inhaled steroids: Secondary | ICD-10-CM | POA: Diagnosis not present

## 2024-03-17 DIAGNOSIS — Z820 Family history of epilepsy and other diseases of the nervous system: Secondary | ICD-10-CM

## 2024-03-17 DIAGNOSIS — R0609 Other forms of dyspnea: Secondary | ICD-10-CM | POA: Diagnosis not present

## 2024-03-17 LAB — TSH: TSH: 1.741 u[IU]/mL (ref 0.350–4.500)

## 2024-03-17 LAB — BASIC METABOLIC PANEL WITH GFR
Anion gap: 13 (ref 5–15)
BUN: 7 mg/dL — ABNORMAL LOW (ref 8–23)
CO2: 27 mmol/L (ref 22–32)
Calcium: 8.8 mg/dL — ABNORMAL LOW (ref 8.9–10.3)
Chloride: 98 mmol/L (ref 98–111)
Creatinine, Ser: 0.76 mg/dL (ref 0.61–1.24)
GFR, Estimated: >60 mL/min (ref >60)
Glucose, Bld: 127 mg/dL — ABNORMAL HIGH (ref 70–99)
Potassium: 3.1 mmol/L — ABNORMAL LOW (ref 3.5–5.1)
Sodium: 138 mmol/L (ref 135–145)

## 2024-03-17 LAB — RESP PANEL BY RT-PCR (RSV, FLU A&B, COVID)  RVPGX2
Influenza A by PCR: NEGATIVE
Influenza B by PCR: NEGATIVE
Resp Syncytial Virus by PCR: NEGATIVE
SARS Coronavirus 2 by RT PCR: NEGATIVE

## 2024-03-17 LAB — TROPONIN I (HIGH SENSITIVITY)
Troponin I (High Sensitivity): 6 ng/L (ref <18)
Troponin I (High Sensitivity): 6 ng/L (ref <18)

## 2024-03-17 LAB — CBC
HCT: 33.2 % — ABNORMAL LOW (ref 39.0–52.0)
Hemoglobin: 10.9 g/dL — ABNORMAL LOW (ref 13.0–17.0)
MCH: 30.1 pg (ref 26.0–34.0)
MCHC: 32.8 g/dL (ref 30.0–36.0)
MCV: 91.7 fL (ref 80.0–100.0)
Platelets: 229 10*3/uL (ref 150–400)
RBC: 3.62 MIL/uL — ABNORMAL LOW (ref 4.22–5.81)
RDW: 13.3 % (ref 11.5–15.5)
WBC: 7.7 10*3/uL (ref 4.0–10.5)
nRBC: 0 % (ref 0.0–0.2)

## 2024-03-17 LAB — MAGNESIUM: Magnesium: 1.7 mg/dL (ref 1.7–2.4)

## 2024-03-17 LAB — BRAIN NATRIURETIC PEPTIDE: B Natriuretic Peptide: 344.5 pg/mL — ABNORMAL HIGH (ref 0.0–100.0)

## 2024-03-17 MED ORDER — HEPARIN (PORCINE) 25000 UT/250ML-% IV SOLN
900.0000 [IU]/h | INTRAVENOUS | Status: DC
  Administered 2024-03-18: 750 [IU]/h via INTRAVENOUS
  Filled 2024-03-17: qty 250

## 2024-03-17 MED ORDER — IPRATROPIUM-ALBUTEROL 0.5-2.5 (3) MG/3ML IN SOLN: 3.0000 mL | RESPIRATORY_TRACT | Status: DC | PRN

## 2024-03-17 MED ORDER — POTASSIUM CHLORIDE 10 MEQ/100ML IV SOLN
10.0000 meq | INTRAVENOUS | Status: AC
  Administered 2024-03-17 – 2024-03-18 (×4): 10 meq via INTRAVENOUS
  Filled 2024-03-17 (×4): qty 100

## 2024-03-17 MED ORDER — IPRATROPIUM-ALBUTEROL 0.5-2.5 (3) MG/3ML IN SOLN
3.0000 mL | Freq: Once | RESPIRATORY_TRACT | Status: AC
  Administered 2024-03-17: 3 mL via RESPIRATORY_TRACT
  Filled 2024-03-17: qty 3

## 2024-03-17 MED ORDER — ATORVASTATIN CALCIUM 10 MG PO TABS
20.0000 mg | ORAL_TABLET | Freq: Every day | ORAL | Status: DC
  Administered 2024-03-18 – 2024-03-19 (×2): 20 mg via ORAL
  Filled 2024-03-17 (×2): qty 2

## 2024-03-17 MED ORDER — METOPROLOL TARTRATE 12.5 MG HALF TABLET
12.5000 mg | ORAL_TABLET | Freq: Two times a day (BID) | ORAL | Status: DC
  Administered 2024-03-17 – 2024-03-19 (×4): 12.5 mg via ORAL
  Filled 2024-03-17 (×4): qty 1

## 2024-03-17 MED ORDER — HEPARIN BOLUS VIA INFUSION
2500.0000 [IU] | Freq: Once | INTRAVENOUS | Status: AC
Start: 2024-03-17 — End: 2024-03-18
  Administered 2024-03-18: 2500 [IU] via INTRAVENOUS
  Filled 2024-03-17: qty 2500

## 2024-03-17 MED ORDER — VITAMIN B-12 1000 MCG PO TABS
500.0000 ug | ORAL_TABLET | Freq: Every day | ORAL | Status: DC
  Administered 2024-03-18 – 2024-03-19 (×2): 500 ug via ORAL
  Filled 2024-03-17 (×2): qty 1

## 2024-03-17 MED ORDER — SERTRALINE HCL 50 MG PO TABS
50.0000 mg | ORAL_TABLET | Freq: Every day | ORAL | Status: DC
  Administered 2024-03-18 – 2024-03-19 (×2): 50 mg via ORAL
  Filled 2024-03-17 (×2): qty 1

## 2024-03-17 MED ORDER — CLOPIDOGREL BISULFATE 75 MG PO TABS
75.0000 mg | ORAL_TABLET | Freq: Every day | ORAL | Status: DC
  Administered 2024-03-18 – 2024-03-19 (×2): 75 mg via ORAL
  Filled 2024-03-17 (×2): qty 1

## 2024-03-17 MED ORDER — MAGNESIUM SULFATE 4 GM/100ML IV SOLN
4.0000 g | Freq: Once | INTRAVENOUS | Status: AC
  Administered 2024-03-17: 4 g via INTRAVENOUS
  Filled 2024-03-17: qty 100

## 2024-03-17 MED ORDER — METHYLPREDNISOLONE SODIUM SUCC 125 MG IJ SOLR
80.0000 mg | Freq: Every day | INTRAMUSCULAR | Status: DC
  Administered 2024-03-18 – 2024-03-19 (×3): 80 mg via INTRAVENOUS
  Filled 2024-03-17 (×3): qty 2

## 2024-03-17 MED ORDER — POTASSIUM CHLORIDE CRYS ER 20 MEQ PO TBCR
40.0000 meq | EXTENDED_RELEASE_TABLET | Freq: Once | ORAL | Status: AC
  Administered 2024-03-17: 40 meq via ORAL
  Filled 2024-03-17: qty 2

## 2024-03-17 MED ORDER — IPRATROPIUM-ALBUTEROL 0.5-2.5 (3) MG/3ML IN SOLN
3.0000 mL | RESPIRATORY_TRACT | Status: DC
  Administered 2024-03-18 (×2): 3 mL via RESPIRATORY_TRACT
  Filled 2024-03-17 (×2): qty 3

## 2024-03-17 NOTE — ED Provider Notes (Signed)
 [place] EMERGENCY DEPARTMENT AT [facility] Provider Note   CSN: [id number] Arrival date & time: 03/17/24  1509     History  Chief Complaint  Patient presents with   Shortness of Breath    Thomas Ferrell is a 83 y.o. male.  The history is provided by the patient and medical records. No language interpreter was used.  Shortness of Breath Severity:  Severe Onset quality:  Gradual Duration:  1 week Timing:  Constant Progression:  Waxing and waning Chronicity:  New Context: not URI   Relieved by:  Nothing Worsened by:  Exertion Ineffective treatments:  None tried Associated symptoms: no abdominal pain, no chest pain, no cough, no diaphoresis, no fever, no headaches, no neck pain, no rash, no sputum production, no vomiting and no wheezing   Risk factors: no hx of PE/DVT        Home Medications Prior to Admission medications   Medication Sig Start Date End Date Taking? Authorizing Provider  acetaminophen  (TYLENOL ) 500 MG tablet Take 1,000 mg by mouth every 6 (six) hours as needed for mild pain or headache.     [provider]  amLODipine  (NORVASC ) 10 MG tablet Take 10 mg by mouth daily.    [provider]  aspirin  EC 81 MG tablet Take 81 mg by mouth daily. Swallow whole.    [provider]  atorvastatin (LIPITOR) 20 MG tablet Take 20 mg by mouth daily. 07/17/22   [provider]  clopidogrel  (PLAVIX ) 75 MG tablet Take 1 tablet (75 mg total) by mouth daily. 07/03/23 07/02/24  Robins, Joshua E, MD  ketotifen (ZADITOR) 0.035 % ophthalmic solution Place 1 drop into both eyes daily as needed (Dry eye).    [provider]  lisinopril  (PRINIVIL ,ZESTRIL ) 40 MG tablet Take 40 mg by mouth daily.    [provider]  sertraline  (ZOLOFT ) 50 MG tablet Take 50 mg by mouth daily.    [provider]  vitamin B-12 (CYANOCOBALAMIN) 500 MCG tablet Take 500 mcg by mouth daily.    [provider]      Allergies     Hydrocodone  and Morphine  and codeine    Review of Systems   Review of Systems  Constitutional:  Positive for fatigue. Negative for chills, diaphoresis and fever.  HENT:  Negative for congestion.   Respiratory:  Positive for chest tightness and shortness of breath. Negative for cough, sputum production and wheezing.   Cardiovascular:  Positive for palpitations and leg swelling. Negative for chest pain.  Gastrointestinal:  Negative for abdominal pain, constipation, diarrhea, nausea and vomiting.  Genitourinary:  Negative for dysuria, flank pain and frequency.  Musculoskeletal:  Negative for back pain, neck pain and neck stiffness.  Skin:  Negative for rash and wound.  Neurological:  Negative for light-headedness and headaches.  Psychiatric/Behavioral:  Negative for agitation.   All other systems reviewed and are negative.   Physical Exam Updated Vital Signs BP (!) 148/72   Pulse 94   Temp 97.6 F (36.4 C) (Oral)   Resp (!) 27   Ht 5\' 4"  (1.626 m)   Wt 53.5 kg   SpO2 100%   BMI 20.25 kg/m  Physical Exam Vitals and nursing note reviewed.  Constitutional:      General: He is not in acute distress.    Appearance: He is well-developed. He is not ill-appearing, toxic-appearing or diaphoretic.  HENT:     Head: Normocephalic and atraumatic.  Eyes:     Conjunctiva/sclera: Conjunctivae  normal.  Cardiovascular:     Rate and Rhythm: Normal rate and regular rhythm.     Heart sounds: No murmur heard. Pulmonary:     Effort: Pulmonary effort is normal. No respiratory distress.     Breath sounds: Rales present.  Chest:     Chest wall: No tenderness.  Abdominal:     Palpations: Abdomen is soft.     Tenderness: There is no abdominal tenderness.  Musculoskeletal:        General: No swelling.     Cervical back: Neck supple.     Right lower leg: No tenderness. Edema present.     Left lower leg: No tenderness. Edema present.  Skin:    General: Skin is warm and dry.     Capillary  Refill: Capillary refill takes less than 2 seconds.     Findings: No erythema.     Nails: There is no clubbing.  Neurological:     Mental Status: He is alert.  Psychiatric:        Mood and Affect: Mood normal.     ED Results / Procedures / Treatments   Labs (all labs ordered are listed, but only abnormal results are displayed) Labs Reviewed  BASIC METABOLIC PANEL WITH GFR - Abnormal; Notable for the following components:      Result Value   Potassium 3.1 (*)    Glucose, Bld 127 (*)    BUN 7 (*)    Calcium 8.8 (*)    All other components within normal limits  CBC - Abnormal; Notable for the following components:   RBC 3.62 (*)    Hemoglobin 10.9 (*)    HCT 33.2 (*)    All other components within normal limits  BRAIN NATRIURETIC PEPTIDE - Abnormal; Notable for the following components:   B Natriuretic Peptide 344.5 (*)    All other components within normal limits  RESP PANEL BY RT-PCR (RSV, FLU A&B, COVID)  RVPGX2  MAGNESIUM   TSH  TROPONIN I (HIGH SENSITIVITY)  TROPONIN I (HIGH SENSITIVITY)    EKG EKG Interpretation Date/Time:  Tuesday Mar 17 2024 15:18:41 EDT Ventricular Rate:  91 PR Interval:    QRS Duration:  84 QT Interval:  426 QTC Calculation: 525 R Axis:   80  Text Interpretation: Atrial fibrillation Nonspecific repol abnormality, diffuse leads Prolonged QT interval when compared to prior, now afib No STEMI Confirmed by Wynell Heath ([id number]) on 03/17/2024 5:05:04 PM  Radiology DG Chest 2 View Result Date: 03/17/2024 CLINICAL DATA:  Shortness of breath for 1 week. EXAM: CHEST - 2 VIEW COMPARISON:  June 27, 2016 FINDINGS: The heart size and mediastinal contours are within normal limits. Marked severity calcification of the thoracic aorta is present. There is evidence of emphysematous lung disease with mild, diffuse, chronic appearing increased interstitial lung markings also noted. Mild lingular, mid right lung and bibasilar scarring and/or atelectasis is present.  There is no evidence of focal consolidation, pleural effusion or pneumothorax. The visualized skeletal structures are unremarkable. IMPRESSION: 1. Emphysematous lung disease with mild, diffuse, chronic appearing increased interstitial lung markings. 2. Mild lingular, mid right lung and bibasilar scarring and/or atelectasis. Electronically Signed   By: Virgle Grime M.D.   On: 03/17/2024 16:24    Procedures Procedures    Medications Ordered in ED Medications  magnesium  sulfate IVPB 4 g 100 mL (4 g Intravenous New Bag/Given 03/17/24 2134)  potassium chloride  10 mEq in 100 mL IVPB (10 mEq Intravenous New Bag/Given 03/17/24 2133)  metoprolol  tartrate (  LOPRESSOR ) tablet 12.5 mg (has no administration in time range)  ipratropium-albuterol  (DUONEB) 0.5-2.5 (3) MG/3ML nebulizer solution 3 mL (3 mLs Nebulization Given 03/17/24 2028)  potassium chloride  SA (KLOR-CON  M) CR tablet 40 mEq (40 mEq Oral Given 03/17/24 2127)    ED Course/ Medical Decision Making/ A&P                                 Medical Decision Making Amount and/or Complexity of Data Reviewed Labs: ordered. Radiology: ordered.  Risk Prescription drug management. Decision regarding hospitalization.    KERMAN BREECH is a 83 y.o. male with a past medical history significant for COPD, hypertension, dyslipidemia, previous stroke, bladder cancer, peripheral arterial disease status post stenting on aspirin  and Plavix  and previous cholecystectomy who presents for about a week of worsening fatigue, exertional shortness of breath, orthopnea, mild edema, occasional palpitations, and concern for new atrial fibrillation and PCP.  According to patient, for just over a week he is not having worsening exertional shortness breath or fatigue.  When he starts speaking he cannot get through long sentences without getting winded.  This is new.  He reports a mild edema in his legs but he did not notice very much of it.  He denies fevers or chills and he  has a chronic cough with his COPD.  Does not report it is worse.  He reports no chest pain or abdominal pain and has had no nausea vomiting.  He reports chronic constipation diarrhea with irritable bowel syndrome and denies urinary changes.  Denies trauma.  Denies falls.  He went to PCP today and was found to be in A-fib with heart rate just over 100.  He has no history of this.  He does not take blood thinners to take the antiplatelet medications for the previous arterial stenting.  On my exam, lungs had rales bilaterally.  No wheezing.  Chest and abdomen nontender.  No murmur.  He does have intact pulses.  Legs have mild edema however patient does indeed show A-fib on the monitor.  No STEMI seen.  He got very tachypneic even with long conversation were had to catch his breath.  Oxygen  saturation dipped to about 90% on room air during conversation but bounced back with rest.  Clinically I think he will need admission for new A-fib causing exertional shortness of breath and difficulty breathing.  Will get screening labs, chest x-ray, and will call medicine for likely admission.  He thinks it is been at least a week that he has been having these symptoms but may have had palpitations even before that.   Anticipate admission.  Patient's oxygen  saturation started to worsen to about 90% and he was breathing heavier even at rest.  Patient started on oxygen .  He had to have it escalated after he tried to use the hand-held urinal and use more effort.  Nursing gave him a breathing treatment.   His workup continued to return.  Troponin was negative and he does not have COVID/flu/RSV.  His BNP is elevated over 340 which is higher than prior.  Potassium is slightly low, will order potassium supplementation.  No leukocytosis.  Doubt pneumonia.  Clinically I suspect his new A-fib is causing his edema and shortness of breath and oxygen  requirement.  Will call medicine for admission and will let cardiology know to see  if they would like to do anything different.  Final Clinical Impression(s) / ED Diagnoses Final diagnoses:  New onset a-fib (HCC)  SOB (shortness of breath)  Peripheral edema      Clinical Impression: 1. New onset a-fib (HCC)   2. SOB (shortness of breath)   3. Peripheral edema     Disposition: Admit  This note was prepared with assistance of Dragon voice recognition software. Occasional wrong-word or sound-a-like substitutions may have occurred due to the inherent limitations of voice recognition software.     Latifa Noble, Marine Sia, MD 03/17/24 2230

## 2024-03-17 NOTE — Consult Note (Signed)
 Cardiology Consultation   Patient ID: Thomas Ferrell MRN: [id number]; DOB: 1941/11/01  Admit date: 03/17/2024 Date of Consult: 03/17/2024  PCP:  Wyn Heater, MD   [facility] Providers Cardiologist:  None        Patient Profile:   Thomas Ferrell is a 83 y.o. male with a hx of PAD (previous bilateral common femoral endarterectomies, right to left femoral-femoral bypass and left femoral to above knee popliteal bypass with Propaten on 12/09/13 by Dr. Nolene Baumgarten for ischemic left leg), Carotid artery disease (history of right CEA on 10/21/06 by Dr. Nolene Baumgarten for symptomatic carotid stenosis), previous CVA/TIA, HTN, HLD, COPD, bladder cancer, previous spinal injury who is being seen 03/17/2024 for the evaluation of new atrial fibrillation at the request of Dr. Manus Sellers.  History of Present Illness:   Thomas Ferrell states that about a month ago he started noticing intermittent heart racing/palpitations.  He is nonambulatory at baseline due to left foot neuropathy.  Will occasionally use a walker to ambulate though this occurs very frequently.  He started to notice dyspnea with any physical effort about 1 week ago which also resulted in elevated and irregular heart rates.  He has never been diagnosed with an arrhythmia in the past.  Denies any weight gain/weight loss.  Also has noted worsened orthopnea, at baseline uses 2 pillows to sleep.  Has also had difficulty with falling and staying asleep recently secondary to dyspnea.  Denies no recent chest discomfort or lower extremity edema.  Does feel abdominal bloating/fullness.  Denies ever being diagnosed with heart failure in the past.  Reports a previous history of CVA/TIA (unclear which) and unable to elaborate much further.  Does state that he has had multiple TIAs in the past.  Does have a known history of symptomatic carotid artery disease requiring right CEA in 2007.  He remains on aspirin  and Plavix  for obstructive PAD and follows regularly with  vascular surgery.  States that he has been diagnosed with COPD in the past.  He is a former smoker and does not drink alcohol  anymore.  In the emergency room, afebrile, HR 70-100s, BP 140s-150s/70-80s, requiring 1-2 L per nasal cannula with SpO2 96-100.  CBC without leukocytosis, hemoglobin 10.9.  BMP remarkable for K3.1, creatinine 0.76.  Magnesium  1.7.  High-sensitivity troponin 6-6.  BNP 344 (newly elevated).  Basic RVP negative.  CXR without clear focal pneumonia, pulmonary edema or pleural effusion.  EKG with rate controlled atrial fibrillation.  Past Medical History:  Diagnosis Date   Abnormality of gait 03/03/2013   bladder ca dx'd 11/2009   chemo/xrt comp 02/2010   Cerebrovascular disease    right brain CVA 2007   COPD (chronic obstructive pulmonary disease) (HCC)    Cough productive of clear sputum    TX RECENT SINUS INFECTION    CVA (cerebral vascular accident) (HCC) 2007   r-cva   Dyslipidemia    Hypertension    Lumbago     Past Surgical History:  Procedure Laterality Date   ABDOMINAL AORTAGRAM N/A 12/07/2013   Procedure: ABDOMINAL Tommi Fraise;  Surgeon: Dannis Dy, MD;  Location[facility] CATH LAB;  Service: Cardiovascular;  Laterality: N/A;   ABDOMINAL AORTOGRAM W/LOWER EXTREMITY N/A 06/26/2023   Procedure: ABDOMINAL AORTOGRAM W/LOWER EXTREMITY;  Surgeon: Kayla Part, MD;  Location: [facility] INVASIVE CV LAB;  Service: Cardiovascular;  Laterality: N/A;   BACK SURGERY  2010   bladder cancer     CAROTID ARTERY ANGIOPLASTY  2009   CHOLECYSTECTOMY N/A 06/06/2016  Procedure: LAPAROSCOPIC CHOLECYSTECTOMY WITH INTRAOPERATIVE CHOLANGIOGRAM;  Surgeon: Oralee Billow, MD;  Location: [facility] ORS;  Service: General;  Laterality: N/A;   ERCP N/A 06/05/2016   Procedure: ENDOSCOPIC RETROGRADE CHOLANGIOPANCREATOGRAPHY (ERCP);  Surgeon: Delilah Fend, MD;  Location: [facility] ENDOSCOPY;  Service: Endoscopy;  Laterality: N/A;   ESOPHAGOGASTRODUODENOSCOPY (EGD) WITH PROPOFOL  N/A 06/05/2016   Procedure:  ESOPHAGOGASTRODUODENOSCOPY (EGD);  Surgeon: Delilah Fend, MD;  Location: [facility] ENDOSCOPY;  Service: Endoscopy;  Laterality: N/A;   FEMORAL-FEMORAL BYPASS GRAFT N/A 12/09/2013   Procedure: BYPASS GRAFT FEMORAL-FEMORAL ARTERY WITH BILATERAL ENDARTERECTOMY ;  Surgeon: Richrd Char, MD;  Location: [facility] OR;  Service: Vascular;  Laterality: N/A;   FEMORAL-POPLITEAL BYPASS GRAFT Left 12/09/2013   Procedure: BYPASS GRAFT FEMORAL-POPLITEAL ARTERY - LEFT ;  Surgeon: Richrd Char, MD;  Location: [facility] OR;  Service: Vascular;  Laterality: Left;   LAMINECTOMY  09/08/2013   L 2 L3 L4 L5       Dr Susen Epstein   LOWER EXTREMITY ANGIOGRAPHY Right 07/03/2023   Procedure: Lower Extremity Angiography;  Surgeon: Kayla Part, MD;  Location[facility] INVASIVE CV LAB;  Service: Cardiovascular;  Laterality: Right;   PERIPHERAL VASCULAR BALLOON ANGIOPLASTY Right 07/03/2023   Procedure: PERIPHERAL VASCULAR BALLOON ANGIOPLASTY;  Surgeon: Kayla Part, MD;  Location: [facility] INVASIVE CV LAB;  Service: Cardiovascular;  Laterality: Right;  R external iliac     Home Medications:  Prior to Admission medications   Medication Sig Start Date End Date Taking? Authorizing Provider  acetaminophen  (TYLENOL ) 500 MG tablet Take 1,000 mg by mouth every 6 (six) hours as needed for mild pain or headache.    Yes [provider]  acetaminophen  (TYLENOL ) 650 MG CR tablet Take 600-1,300 mg by mouth at bedtime.   Yes [provider]  amLODipine  (NORVASC ) 10 MG tablet Take 10 mg by mouth in the morning.   Yes [provider]  aspirin  EC 81 MG tablet Take 81 mg by mouth in the morning. Swallow whole.   Yes [provider]  atorvastatin (LIPITOR) 20 MG tablet Take 20 mg by mouth in the morning. 07/17/22  Yes [provider]  bismuth subsalicylate (PEPTO BISMOL) 262 MG chewable tablet Chew 262 mg by mouth at bedtime.   Yes [provider]  clopidogrel  (PLAVIX ) 75 MG tablet Take 1 tablet (75 mg total) by mouth  daily. Patient taking differently: Take 75 mg by mouth in the morning. 07/03/23 07/02/24 Yes Robins, Joshua E, MD  Dextromethorphan  HBr (COUGH SUPPRESSANT PO) Take 1 tablet by mouth every 4 (four) hours as needed. [facility]   Yes [provider]  ketotifen (ZADITOR) 0.035 % ophthalmic solution Place 1 drop into both eyes daily as needed (Dry eye).   Yes [provider]  lisinopril  (PRINIVIL ,ZESTRIL ) 40 MG tablet Take 40 mg by mouth in the morning.   Yes [provider]  sertraline  (ZOLOFT ) 50 MG tablet Take 50 mg by mouth in the morning.   Yes [provider]  vitamin B-12 (CYANOCOBALAMIN) 500 MCG tablet Take 500 mcg by mouth in the morning.   Yes [provider]    Inpatient Medications: Scheduled Meds:  potassium chloride   40 mEq Oral Once   Continuous Infusions:  magnesium  sulfate bolus IVPB     potassium chloride      PRN Meds:   Allergies:    Allergies  Allergen Reactions   Hydrocodone  Other (See Comments)    hallucinations   Morphine  And Codeine Itching    Social History:  Social History   Socioeconomic History   Marital status: Married    Spouse name: Not on file   Number of children: 2   Years of education: College   Highest education level: Not on file  Occupational History    Employer: RETIRED    Comment: Retired  Tobacco Use   Smoking status: Former    Current packs/day: 0.00    Types: Cigarettes    Quit date: 11/12/2005    Years since quitting: 18.3    Passive exposure: Never   Smokeless tobacco: Never  Vaping Use   Vaping status: Never Used  Substance and Sexual Activity   Alcohol  use: No   Drug use: No   Sexual activity: Not on file  Other Topics Concern   Not on file  Social History Narrative   Not on file   Social Drivers of Health   Financial Resource Strain: Not on file  Food Insecurity: Not on file  Transportation Needs: Not on file  Physical Activity: Not on file  Stress: Not on file  Social  Connections: Not on file  Intimate Partner Violence: Not on file    Family History:    Family History  Problem Relation Age of Onset   Stroke Mother    Cancer Father    Dementia Father    Cancer Sister      ROS:  Please see the history of present illness.   All other ROS reviewed and negative.     Physical Exam/Data:   Vitals:   03/17/24 1700 03/17/24 1830 03/17/24 1847 03/17/24 1900  BP: (!) 153/60 (!) 142/83  (!) 145/70  Pulse: 99 91  70  Resp: (!) 21 (!) 24  17  Temp:   98.8 F (37.1 C)   TempSrc:   Oral   SpO2: 96% 100%  100%  Weight:      Height:       No intake or output data in the 24 hours ending 03/17/24 2124    03/17/2024    3:16 PM 02/20/2024   11:29 AM 11/21/2023    2:33 PM  Last 3 Weights  Weight (lbs) 118 lb 118 lb 118 lb 9.6 oz  Weight (kg) 53.524 kg 53.524 kg 53.797 kg     Body mass index is 20.25 kg/m.  General: No acute distress HEENT: Poor dentition Neck: JVD to mid neck when almost upright Vascular: 2+ radial bilaterally Cardiac: Tachycardic and irregularly irregular, no murmur Lungs: Crackles at the bases, mild respiratory distress with conversation Abd: soft, nontender, no hepatomegaly  Ext: no edema Musculoskeletal:  No deformities, BUE and BLE strength normal and equal Skin: warm and dry  Neuro:  CNs 2-12 intact, no focal abnormalities noted Psych:  Normal affect   EKG:  The EKG was personally reviewed and demonstrates:  atrial fibrillation Telemetry:  Telemetry was personally reviewed and demonstrates:  Afib with occasional RVR  Relevant CV Studies: No previous echo  Laboratory Data:  High Sensitivity Troponin:   Recent Labs  Lab 03/17/24 1525 03/17/24 1722  TROPONINIHS 6 6     Chemistry Recent Labs  Lab 03/17/24 1525 03/17/24 1722  NA 138  --   K 3.1*  --   CL 98  --   CO2 27  --   GLUCOSE 127*  --   BUN 7*  --   CREATININE 0.76  --   CALCIUM 8.8*  --   MG  --  1.7  GFRNONAA >60  --   ANIONGAP  13  --     No  results for input(s): "PROT", "ALBUMIN", "AST", "ALT", "ALKPHOS", "BILITOT" in the last 168 hours. Lipids No results for input(s): "CHOL", "TRIG", "HDL", "LABVLDL", "LDLCALC", "CHOLHDL" in the last 168 hours.  Hematology Recent Labs  Lab 03/17/24 1525  WBC 7.7  RBC 3.62*  HGB 10.9*  HCT 33.2*  MCV 91.7  MCH 30.1  MCHC 32.8  RDW 13.3  PLT 229   Thyroid   Recent Labs  Lab 03/17/24 1722  TSH 1.741    BNP Recent Labs  Lab 03/17/24 1525  BNP 344.5*    DDimer No results for input(s): "DDIMER" in the last 168 hours.   Radiology/Studies:  DG Chest 2 View Result Date: 03/17/2024 CLINICAL DATA:  Shortness of breath for 1 week. EXAM: CHEST - 2 VIEW COMPARISON:  June 27, 2016 FINDINGS: The heart size and mediastinal contours are within normal limits. Marked severity calcification of the thoracic aorta is present. There is evidence of emphysematous lung disease with mild, diffuse, chronic appearing increased interstitial lung markings also noted. Mild lingular, mid right lung and bibasilar scarring and/or atelectasis is present. There is no evidence of focal consolidation, pleural effusion or pneumothorax. The visualized skeletal structures are unremarkable. IMPRESSION: 1. Emphysematous lung disease with mild, diffuse, chronic appearing increased interstitial lung markings. 2. Mild lingular, mid right lung and bibasilar scarring and/or atelectasis. Electronically Signed   By: Virgle Grime M.D.   On: 03/17/2024 16:24     Assessment and Plan:   Atrial fibrillation with occasional RVR, new onset CHA2DS2-VASc 7.  Presenting with newly discovered symptomatic atrial fibrillation with occasional RVR.  Symptoms have been present for likely at least 1 week, potentially longer consist of tachypalpitations and potentially dyspnea.  Dyspnea also may be related to acute heart failure as evidenced by signs of volume overload including elevated JVP and crackles at the lung bases with elevated BNP  which is new.  Also found to be hypokalemic and hypomagnesemic for unclear reasons which is likely contributing to the atrial fibrillation.  No other obvious inciting factor for new onset atrial fibrillation.  Does have known lung disease (COPD) is no longer a smoker.  TSH normal.  Has not been evaluated for OSA.  No longer drinking alcohol . - Aggressive supplementation of electrolytes (I have ordered) - Following electrolyte supplementation, IV Lasix  40 mg x 1 - TTE in the morning - N.p.o. at midnight for possible TEE/DCCV - Start metoprolol  12.5 every 12 hours - Duonebs per medicine though no wheezing on exam and they may exacerbate afib - Start IV heparin   Acute heart failure, systolic versus diastolic As above, presenting with signs and symptoms consistent with acute onset heart failure likely secondary to atrial fibrillation.  Diuresis and workup plans as above.  Peripheral artery disease Carotid artery disease Continue aspirin  and Plavix  for now.  Can discuss whether or not aspirin  can be discontinued given he will be discharged likely on apixaban for stroke prophylaxis in the setting of newly discovered atrial fibrillation.  May need to touch base with vascular surgery as he is closely followed with them regarding potential triple therapy.  Hypertension Hyperlipidemia Continue home lisinopril  that would hold home amlodipine  in anticipation of possible TEE/cardioversion and new start metoprolol .  Risk Assessment/Risk Scores:        New York  Heart Association (NYHA) Functional Class NYHA Class III  CHA2DS2-VASc Score =   7  This indicates a  % annual risk of stroke. The patient's score is based  upon:           For questions or updates, please contact [facility] Please consult [email or web address] for contact info under    Signed, Chandler Combs, MD  03/17/2024 9:24 PM\

## 2024-03-17 NOTE — ED Triage Notes (Signed)
 Pt BIB EMS with c/o SOB for about a week. Pt sent from MD office with concerns for a.fib on EKG. Pt does not have a hx of a.fib. Pt does have hx of COPD. Pt denies CP. Pt has left sided deficit from previous stroke. Pt endorse exertional SOB   150/60 HR 60-120 96% on RA 20G L AC

## 2024-03-17 NOTE — H&P (Signed)
 History and Physical    TAL MCCREEDY WUJ:[id number] DOB: 1941-10-19 DOA: 03/17/2024  Patient coming from: Home.  Chief Complaint: Shortness of breath.  HPI: Thomas Ferrell is a 83 y.o. male with history of peripheral arterial disease and carotid artery stenosis with remote history of bilateral common femoral endarterectomy and bypass, hypertension, hyperlipidemia, prior history of tobacco abuse, stroke/TIA, COPD, anemia presents to the ER because of shortness of breath.  Patient states he has been short of breath for some time but has acutely worsened over the last 1 week with some occasional palpitations.  Denies any chest pain.  Patient is at baseline nonambulatory due to his neuropathy.  Patient also has symptoms of orthopnea.  Denies any productive cough fever or chills.  Patient had gone to his PCPs office and was referred to the ER.  ED Course: In the ER patient is found to be in A-fib but rate controlled.  Mildly hypoxic requiring 2 L oxygen .  Labs show BNP of 344 troponin of 6 potassium of 3.1 magnesium  of 1.7.  Hemoglobin 10.9.  Cardiology was consulted patient was placed on IV metoprolol  and potassium and magnesium  replacements ordered and cardiology planning to give Lasix  once electrolyte corrected.  Patient admitted for new onset A-fib with occasional RVR with new onset CHF.  Review of Systems: As per HPI, rest all negative.   Past Medical History:  Diagnosis Date   Abnormality of gait 03/03/2013   bladder ca dx'd 11/2009   chemo/xrt comp 02/2010   Cerebrovascular disease    right brain CVA 2007   COPD (chronic obstructive pulmonary disease) (HCC)    Cough productive of clear sputum    TX RECENT SINUS INFECTION    CVA (cerebral vascular accident) (HCC) 2007   r-cva   Dyslipidemia    Hypertension    Lumbago     Past Surgical History:  Procedure Laterality Date   ABDOMINAL AORTAGRAM N/A 12/07/2013   Procedure: ABDOMINAL Tommi Fraise;  Surgeon: Dannis Dy, MD;   [facility];  Service: Cardiovascular;  Laterality: N/A;   ABDOMINAL AORTOGRAM W/LOWER EXTREMITY N/A 06/26/2023   Procedure: ABDOMINAL AORTOGRAM W/LOWER EXTREMITY;  Surgeon: Kayla Part, MD;  Location: [facility] INVASIVE CV LAB;  Service: Cardiovascular;  Laterality: N/A;   BACK SURGERY  2010   bladder cancer     CAROTID ARTERY ANGIOPLASTY  2009   CHOLECYSTECTOMY N/A 06/06/2016   Procedure: LAPAROSCOPIC CHOLECYSTECTOMY WITH INTRAOPERATIVE CHOLANGIOGRAM;  Surgeon: Oralee Billow, MD;  Location: [facility] ORS;  Service: General;  Laterality: N/A;   ERCP N/A 06/05/2016   Procedure: ENDOSCOPIC RETROGRADE CHOLANGIOPANCREATOGRAPHY (ERCP);  Surgeon: Delilah Fend, MD;  Location: [facility] ENDOSCOPY;  Service: Endoscopy;  Laterality: N/A;   ESOPHAGOGASTRODUODENOSCOPY (EGD) WITH PROPOFOL  N/A 06/05/2016   Procedure: ESOPHAGOGASTRODUODENOSCOPY (EGD);  Surgeon: Delilah Fend, MD;  Location: [facility] ENDOSCOPY;  Service: Endoscopy;  Laterality: N/A;   FEMORAL-FEMORAL BYPASS GRAFT N/A 12/09/2013   Procedure: BYPASS GRAFT FEMORAL-FEMORAL ARTERY WITH BILATERAL ENDARTERECTOMY ;  Surgeon: Richrd Char, MD;  Location: [facility] OR;  Service: Vascular;  Laterality: N/A;   FEMORAL-POPLITEAL BYPASS GRAFT Left 12/09/2013   Procedure: BYPASS GRAFT FEMORAL-POPLITEAL ARTERY - LEFT ;  Surgeon: Richrd Char, MD;  Location: [facility] OR;  Service: Vascular;  Laterality: Left;   LAMINECTOMY  09/08/2013   L 2 L3 L4 L5       Dr Susen Epstein   LOWER EXTREMITY ANGIOGRAPHY Right 07/03/2023   Procedure: Lower Extremity Angiography;  Surgeon: Kayla Part, MD;  Location: [facility] INVASIVE  CV LAB;  Service: Cardiovascular;  Laterality: Right;   PERIPHERAL VASCULAR BALLOON ANGIOPLASTY Right 07/03/2023   Procedure: PERIPHERAL VASCULAR BALLOON ANGIOPLASTY;  Surgeon: Kayla Part, MD;  Location: [facility] INVASIVE CV LAB;  Service: Cardiovascular;  Laterality: Right;  R external iliac     reports that he quit smoking about 18 years ago. His smoking use included cigarettes. He  has never been exposed to tobacco smoke. He has never used smokeless tobacco. He reports that he does not drink alcohol  and does not use drugs.  Allergies  Allergen Reactions   Hydrocodone  Other (See Comments)    hallucinations   Morphine  And Codeine Itching    Family History  Problem Relation Age of Onset   Stroke Mother    Cancer Father    Dementia Father    Cancer Sister     Prior to Admission medications   Medication Sig Start Date End Date Taking? Authorizing Provider  acetaminophen  (TYLENOL ) 500 MG tablet Take 1,000 mg by mouth every 6 (six) hours as needed for mild pain or headache.    Yes [provider]  acetaminophen  (TYLENOL ) 650 MG CR tablet Take 600-1,300 mg by mouth at bedtime.   Yes [provider]  amLODipine  (NORVASC ) 10 MG tablet Take 10 mg by mouth in the morning.   Yes [provider]  aspirin  EC 81 MG tablet Take 81 mg by mouth in the morning. Swallow whole.   Yes [provider]  atorvastatin (LIPITOR) 20 MG tablet Take 20 mg by mouth in the morning. 07/17/22  Yes [provider]  bismuth subsalicylate (PEPTO BISMOL) 262 MG chewable tablet Chew 262 mg by mouth at bedtime.   Yes [provider]  clopidogrel  (PLAVIX ) 75 MG tablet Take 1 tablet (75 mg total) by mouth daily. Patient taking differently: Take 75 mg by mouth in the morning. 07/03/23 07/02/24 Yes Robins, Joshua E, MD  Dextromethorphan  HBr (COUGH SUPPRESSANT PO) Take 1 tablet by mouth every 4 (four) hours as needed. [facility]   Yes [provider]  ketotifen (ZADITOR) 0.035 % ophthalmic solution Place 1 drop into both eyes daily as needed (Dry eye).   Yes [provider]  lisinopril  (PRINIVIL ,ZESTRIL ) 40 MG tablet Take 40 mg by mouth in the morning.   Yes [provider]  sertraline  (ZOLOFT ) 50 MG tablet Take 50 mg by mouth in the morning.   Yes [provider]  vitamin B-12 (CYANOCOBALAMIN) 500 MCG tablet Take 500 mcg by  mouth in the morning.   Yes [provider]    Physical Exam: Constitutional: Moderately built and nourished. Vitals:   03/17/24 1900 03/17/24 2245 03/17/24 2246 03/17/24 2247  BP: (!) 145/70 134/75 134/75   Pulse: 70 85 84   Resp: 17 (!) 21    Temp:    98.3 F (36.8 C)  TempSrc:    Oral  SpO2: 100% 100%    Weight:      Height:       Eyes: Anicteric no pallor. ENMT: No discharge from the ears/nose or mouth. Neck: No mass felt.  No neck rigidity.  JVD elevated. Respiratory: Bilateral expiratory wheeze heard. Cardiovascular: S1-S2 heard. Abdomen: Soft nontender bowel sound present. Musculoskeletal: No edema. Skin: No rash. Neurologic: Alert awake oriented to time place and person.  Moves all extremities. Psychiatric: Appears normal.  Normal affect.   Labs on Admission: I have personally reviewed following labs and imaging studies  CBC: Recent Labs  Lab 03/17/24 1525  WBC  7.7  HGB 10.9*  HCT 33.2*  MCV 91.7  PLT 229   Basic Metabolic Panel: Recent Labs  Lab 03/17/24 1525 03/17/24 1722  NA 138  --   K 3.1*  --   CL 98  --   CO2 27  --   GLUCOSE 127*  --   BUN 7*  --   CREATININE 0.76  --   CALCIUM 8.8*  --   MG  --  1.7   GFR: Estimated Creatinine Clearance: 54.8 mL/min (by C-G formula based on SCr of 0.76 mg/dL). Liver Function Tests: No results for input(s): "AST", "ALT", "ALKPHOS", "BILITOT", "PROT", "ALBUMIN" in the last 168 hours. No results for input(s): "LIPASE", "AMYLASE" in the last 168 hours. No results for input(s): "AMMONIA" in the last 168 hours. Coagulation Profile: No results for input(s): "INR", "PROTIME" in the last 168 hours. Cardiac Enzymes: No results for input(s): "CKTOTAL", "CKMB", "CKMBINDEX", "TROPONINI" in the last 168 hours. BNP (last 3 results) No results for input(s): "PROBNP" in the last 8760 hours. HbA1C: No results for input(s): "HGBA1C" in the last 72 hours. CBG: No results for input(s): "GLUCAP" in the last  168 hours. Lipid Profile: No results for input(s): "CHOL", "HDL", "LDLCALC", "TRIG", "CHOLHDL", "LDLDIRECT" in the last 72 hours. Thyroid  Function Tests: Recent Labs    03/17/24 1722  TSH 1.741   Anemia Panel: No results for input(s): "VITAMINB12", "FOLATE", "FERRITIN", "TIBC", "IRON", "RETICCTPCT" in the last 72 hours. Urine analysis:    Component Value Date/Time   COLORURINE YELLOW 06/02/2016 1710   APPEARANCEUR CLEAR 06/02/2016 1710   LABSPEC 1.016 06/02/2016 1710   PHURINE 6.0 06/02/2016 1710   GLUCOSEU NEGATIVE 06/02/2016 1710   HGBUR TRACE (A) 06/02/2016 1710   BILIRUBINUR NEGATIVE 06/02/2016 1710   KETONESUR NEGATIVE 06/02/2016 1710   PROTEINUR 30 (A) 06/02/2016 1710   UROBILINOGEN 0.2 12/22/2013 1307   NITRITE NEGATIVE 06/02/2016 1710   LEUKOCYTESUR NEGATIVE 06/02/2016 1710   Sepsis Labs: @LABRCNTIP (procalcitonin:4,lacticidven:4) ) Recent Results (from the past 240 hours)  Resp panel by RT-PCR (RSV, Flu A&B, Covid) Anterior Nasal Swab     Status: None   Collection Time: 03/17/24  5:22 PM   Specimen: Anterior Nasal Swab  Result Value Ref Range Status   SARS Coronavirus 2 by RT PCR NEGATIVE NEGATIVE Final   Influenza A by PCR NEGATIVE NEGATIVE Final   Influenza B by PCR NEGATIVE NEGATIVE Final    Comment: (NOTE) The Xpert Xpress SARS-CoV-2/FLU/RSV plus assay is intended as an aid in the diagnosis of influenza from Nasopharyngeal swab specimens and should not be used as a sole basis for treatment. Nasal washings and aspirates are unacceptable for Xpert Xpress SARS-CoV-2/FLU/RSV testing.  Fact Sheet for Patients: BloggerCourse.com  Fact Sheet for Healthcare Providers: SeriousBroker.it  This test is not yet approved or cleared by the [place]  FDA and has been authorized for detection and/or diagnosis of SARS-CoV-2 by FDA under an Emergency Use Authorization (EUA). This EUA will remain in effect (meaning  this test can be used) for the duration of the COVID-19 declaration under Section 564(b)(1) of the Act, 21 U.S.C. section 360bbb-3(b)(1), unless the authorization is terminated or revoked.     Resp Syncytial Virus by PCR NEGATIVE NEGATIVE Final    Comment: (NOTE) Fact Sheet for Patients: BloggerCourse.com  Fact Sheet for Healthcare Providers: SeriousBroker.it  This test is not yet approved or cleared by the [place]  FDA and has been authorized for detection and/or diagnosis of SARS-CoV-2 by FDA under an Emergency Use  Authorization (EUA). This EUA will remain in effect (meaning this test can be used) for the duration of the COVID-19 declaration under Section 564(b)(1) of the Act, 21 U.S.C. section 360bbb-3(b)(1), unless the authorization is terminated or revoked.  Performed at [facility], [address] [place], [place] [address]      Radiological Exams on Admission: DG Chest 2 View Result Date: 03/17/2024 CLINICAL DATA:  Shortness of breath for 1 week. EXAM: CHEST - 2 VIEW COMPARISON:  June 27, 2016 FINDINGS: The heart size and mediastinal contours are within normal limits. Marked severity calcification of the thoracic aorta is present. There is evidence of emphysematous lung disease with mild, diffuse, chronic appearing increased interstitial lung markings also noted. Mild lingular, mid right lung and bibasilar scarring and/or atelectasis is present. There is no evidence of focal consolidation, pleural effusion or pneumothorax. The visualized skeletal structures are unremarkable. IMPRESSION: 1. Emphysematous lung disease with mild, diffuse, chronic appearing increased interstitial lung markings. 2. Mild lingular, mid right lung and bibasilar scarring and/or atelectasis. Electronically Signed   By: Virgle Grime M.D.   On: 03/17/2024 16:24    EKG: Independently reviewed.  A-fib rate  controlled.  Assessment/Plan Principal Problem:   Atrial fibrillation (HCC) Active Problems:   PAD (peripheral artery disease) (HCC)   Acute heart failure (HCC)   COPD with acute exacerbation (HCC)   Hypokalemia   Essential hypertension   HLD (hyperlipidemia)   Anemia    A-fib with occasional RVR -     appreciate cardiology consult.  Patient has been placed on IV metoprolol  and heparin  infusion.  N.p.o. past midnight in anticipation of cardioversion.  Check 2D echo.  TSH was 1.7. Acute heart failure systolic versus diastolic cardiology planning to give IV Lasix  after correction of electrolytes.  Check 2D echo.  Follow intake output metabolic panel daily weights. COPD exacerbation on exam patient is diffusely wheezing will keep patient on scheduled and as needed nebulizer and steroids.  Closely monitor respiratory status. Hypokalemia replace and recheck. Peripheral artery disease on antiplatelet agents and statins. Chronic anemia will check anemia panel. Hypertension will continue lisinopril  as recommended by cardiologist holding amlodipine  for now.  Since patient has new onset A-fib with CHF COPD will need close monitoring and more than 2 midnight stay.   DVT prophylaxis: Heparin  infusion. Code Status: Full code. Family Communication: Discussed with patient. Disposition Plan: Cardiac telemetry. Consults called: Cardiology. Admission status: Observation.

## 2024-03-17 NOTE — Progress Notes (Signed)
 PHARMACY - ANTICOAGULATION CONSULT NOTE  Pharmacy Consult for heparin   Indication: atrial fibrillation  Allergies  Allergen Reactions   Hydrocodone  Other (See Comments)    hallucinations   Morphine  And Codeine Itching    Patient Measurements: Height: 5\' 4"  (162.6 cm) Weight: 53.5 kg (118 lb) IBW/kg (Calculated) : 59.2 HEPARIN  DW (KG): 53.5  Vital Signs: Temp: 98.3 F (36.8 C) (05/06 2247) Temp Source: Oral (05/06 2247) BP: 134/75 (05/06 2246) Pulse Rate: 84 (05/06 2246)  Labs: Recent Labs    03/17/24 1525 03/17/24 1722  HGB 10.9*  --   HCT 33.2*  --   PLT 229  --   CREATININE 0.76  --   TROPONINIHS 6 6    Estimated Creatinine Clearance: 54.8 mL/min (by C-G formula based on SCr of 0.76 mg/dL).   Medical History: Past Medical History:  Diagnosis Date   Abnormality of gait 03/03/2013   bladder ca dx'd 11/2009   chemo/xrt comp 02/2010   Cerebrovascular disease    right brain CVA 2007   COPD (chronic obstructive pulmonary disease) (HCC)    Cough productive of clear sputum    TX RECENT SINUS INFECTION    CVA (cerebral vascular accident) (HCC) 2007   r-cva   Dyslipidemia    Hypertension    Lumbago     Assessment: Patient presenting with new onset Afib with occasional RVR. Not on anticoagulation PTA. Potential TEE/DCCV in the morning. HgB 10.9 and PLTs 229. Pharmacy consulted to dose heparin  infusion.   Goal of Therapy:  Heparin  level 0.3-0.7 units/ml Monitor platelets by anticoagulation protocol: Yes   Plan:  Give 2500 units bolus x 1 Start heparin  infusion at 750 units/hr Check anti-Xa level in 8 hours and daily while on heparin  Continue to monitor H&H and platelets  Mamie Searles, PharmD, BCCCP  03/17/2024,11:13 PM

## 2024-03-18 ENCOUNTER — Telehealth (HOSPITAL_COMMUNITY): Payer: Self-pay | Admitting: Pharmacy Technician

## 2024-03-18 ENCOUNTER — Other Ambulatory Visit (HOSPITAL_COMMUNITY): Payer: Self-pay

## 2024-03-18 ENCOUNTER — Observation Stay (HOSPITAL_COMMUNITY)

## 2024-03-18 DIAGNOSIS — Z8673 Personal history of transient ischemic attack (TIA), and cerebral infarction without residual deficits: Secondary | ICD-10-CM | POA: Diagnosis not present

## 2024-03-18 DIAGNOSIS — Z885 Allergy status to narcotic agent status: Secondary | ICD-10-CM | POA: Diagnosis not present

## 2024-03-18 DIAGNOSIS — I4891 Unspecified atrial fibrillation: Secondary | ICD-10-CM

## 2024-03-18 DIAGNOSIS — I480 Paroxysmal atrial fibrillation: Secondary | ICD-10-CM | POA: Diagnosis not present

## 2024-03-18 DIAGNOSIS — I272 Pulmonary hypertension, unspecified: Secondary | ICD-10-CM | POA: Diagnosis present

## 2024-03-18 DIAGNOSIS — J441 Chronic obstructive pulmonary disease with (acute) exacerbation: Secondary | ICD-10-CM | POA: Diagnosis not present

## 2024-03-18 DIAGNOSIS — G629 Polyneuropathy, unspecified: Secondary | ICD-10-CM | POA: Diagnosis present

## 2024-03-18 DIAGNOSIS — Z95820 Peripheral vascular angioplasty status with implants and grafts: Secondary | ICD-10-CM | POA: Diagnosis not present

## 2024-03-18 DIAGNOSIS — Z79899 Other long term (current) drug therapy: Secondary | ICD-10-CM | POA: Diagnosis not present

## 2024-03-18 DIAGNOSIS — Z809 Family history of malignant neoplasm, unspecified: Secondary | ICD-10-CM | POA: Diagnosis not present

## 2024-03-18 DIAGNOSIS — Z7901 Long term (current) use of anticoagulants: Secondary | ICD-10-CM | POA: Diagnosis not present

## 2024-03-18 DIAGNOSIS — I110 Hypertensive heart disease with heart failure: Secondary | ICD-10-CM | POA: Diagnosis present

## 2024-03-18 DIAGNOSIS — I10 Essential (primary) hypertension: Secondary | ICD-10-CM | POA: Diagnosis not present

## 2024-03-18 DIAGNOSIS — D649 Anemia, unspecified: Secondary | ICD-10-CM

## 2024-03-18 DIAGNOSIS — I739 Peripheral vascular disease, unspecified: Secondary | ICD-10-CM

## 2024-03-18 DIAGNOSIS — I5031 Acute diastolic (congestive) heart failure: Secondary | ICD-10-CM | POA: Diagnosis not present

## 2024-03-18 DIAGNOSIS — E785 Hyperlipidemia, unspecified: Secondary | ICD-10-CM

## 2024-03-18 DIAGNOSIS — E876 Hypokalemia: Secondary | ICD-10-CM | POA: Diagnosis not present

## 2024-03-18 DIAGNOSIS — Z87891 Personal history of nicotine dependence: Secondary | ICD-10-CM | POA: Diagnosis not present

## 2024-03-18 DIAGNOSIS — Z8551 Personal history of malignant neoplasm of bladder: Secondary | ICD-10-CM | POA: Diagnosis not present

## 2024-03-18 DIAGNOSIS — Z7982 Long term (current) use of aspirin: Secondary | ICD-10-CM | POA: Diagnosis not present

## 2024-03-18 DIAGNOSIS — Z7951 Long term (current) use of inhaled steroids: Secondary | ICD-10-CM | POA: Diagnosis not present

## 2024-03-18 DIAGNOSIS — Z823 Family history of stroke: Secondary | ICD-10-CM | POA: Diagnosis not present

## 2024-03-18 DIAGNOSIS — I5033 Acute on chronic diastolic (congestive) heart failure: Secondary | ICD-10-CM | POA: Diagnosis present

## 2024-03-18 DIAGNOSIS — Z1152 Encounter for screening for COVID-19: Secondary | ICD-10-CM | POA: Diagnosis not present

## 2024-03-18 DIAGNOSIS — Z7902 Long term (current) use of antithrombotics/antiplatelets: Secondary | ICD-10-CM | POA: Diagnosis not present

## 2024-03-18 LAB — ECHOCARDIOGRAM COMPLETE
AR max vel: 1.37 cm2
AV Area VTI: 0.96 cm2
AV Area mean vel: 1.17 cm2
AV Mean grad: 9.5 mmHg
AV Peak grad: 16 mmHg
Ao pk vel: 2 m/s
Area-P 1/2: 3.76 cm2
Height: 64 in
MV VTI: 1.35 cm2
S' Lateral: 2.1 cm
Weight: 1888 [oz_av]

## 2024-03-18 LAB — COMPREHENSIVE METABOLIC PANEL WITH GFR
ALT: 15 U/L (ref 0–44)
AST: 25 U/L (ref 15–41)
Albumin: 3.6 g/dL (ref 3.5–5.0)
Alkaline Phosphatase: 101 U/L (ref 38–126)
Anion gap: 11 (ref 5–15)
BUN: 7 mg/dL — ABNORMAL LOW (ref 8–23)
CO2: 24 mmol/L (ref 22–32)
Calcium: 8.8 mg/dL — ABNORMAL LOW (ref 8.9–10.3)
Chloride: 104 mmol/L (ref 98–111)
Creatinine, Ser: 0.83 mg/dL (ref 0.61–1.24)
GFR, Estimated: >60 mL/min (ref >60)
Glucose, Bld: 142 mg/dL — ABNORMAL HIGH (ref 70–99)
Potassium: 4.1 mmol/L (ref 3.5–5.1)
Sodium: 139 mmol/L (ref 135–145)
Total Bilirubin: 0.8 mg/dL (ref 0.0–1.2)
Total Protein: 6.5 g/dL (ref 6.5–8.1)

## 2024-03-18 LAB — HEPARIN LEVEL (UNFRACTIONATED)
Heparin Unfractionated: 0.27 [IU]/mL — ABNORMAL LOW (ref 0.30–0.70)
Heparin Unfractionated: 0.1 [IU]/mL — ABNORMAL LOW (ref 0.30–0.70)

## 2024-03-18 LAB — CBC
HCT: 34 % — ABNORMAL LOW (ref 39.0–52.0)
Hemoglobin: 11.3 g/dL — ABNORMAL LOW (ref 13.0–17.0)
MCH: 30.5 pg (ref 26.0–34.0)
MCHC: 33.2 g/dL (ref 30.0–36.0)
MCV: 91.9 fL (ref 80.0–100.0)
Platelets: 233 10*3/uL (ref 150–400)
RBC: 3.7 MIL/uL — ABNORMAL LOW (ref 4.22–5.81)
RDW: 13.5 % (ref 11.5–15.5)
WBC: 10.2 10*3/uL (ref 4.0–10.5)
nRBC: 0 % (ref 0.0–0.2)

## 2024-03-18 LAB — MAGNESIUM: Magnesium: 2.6 mg/dL — ABNORMAL HIGH (ref 1.7–2.4)

## 2024-03-18 MED ORDER — LEVALBUTEROL HCL 0.63 MG/3ML IN NEBU
0.6300 mg | INHALATION_SOLUTION | Freq: Four times a day (QID) | RESPIRATORY_TRACT | Status: DC
Start: 2024-03-18 — End: 2024-03-18
  Administered 2024-03-18 (×2): 0.63 mg via RESPIRATORY_TRACT
  Filled 2024-03-18 (×2): qty 3

## 2024-03-18 MED ORDER — APIXABAN 5 MG PO TABS: 5.0000 mg | ORAL_TABLET | Freq: Two times a day (BID) | ORAL | Status: DC

## 2024-03-18 MED ORDER — FUROSEMIDE 10 MG/ML IJ SOLN
40.0000 mg | Freq: Once | INTRAMUSCULAR | Status: AC
  Administered 2024-03-18: 40 mg via INTRAVENOUS
  Filled 2024-03-18: qty 4

## 2024-03-18 MED ORDER — PANTOPRAZOLE SODIUM 40 MG PO TBEC
40.0000 mg | DELAYED_RELEASE_TABLET | Freq: Every day | ORAL | Status: DC
  Administered 2024-03-18 – 2024-03-19 (×2): 40 mg via ORAL
  Filled 2024-03-18 (×2): qty 1

## 2024-03-18 MED ORDER — APIXABAN 2.5 MG PO TABS
2.5000 mg | ORAL_TABLET | Freq: Two times a day (BID) | ORAL | Status: DC
  Administered 2024-03-18 – 2024-03-19 (×2): 2.5 mg via ORAL
  Filled 2024-03-18 (×2): qty 1

## 2024-03-18 MED ORDER — IPRATROPIUM BROMIDE 0.02 % IN SOLN: 0.5000 mg | Freq: Two times a day (BID) | RESPIRATORY_TRACT | Status: DC | PRN

## 2024-03-18 MED ORDER — LORATADINE 10 MG PO TABS
10.0000 mg | ORAL_TABLET | Freq: Every day | ORAL | Status: DC
  Administered 2024-03-18 – 2024-03-19 (×2): 10 mg via ORAL
  Filled 2024-03-18 (×2): qty 1

## 2024-03-18 MED ORDER — LEVALBUTEROL HCL 0.63 MG/3ML IN NEBU
0.6300 mg | INHALATION_SOLUTION | Freq: Three times a day (TID) | RESPIRATORY_TRACT | Status: DC
  Administered 2024-03-18 – 2024-03-19 (×3): 0.63 mg via RESPIRATORY_TRACT
  Filled 2024-03-18 (×3): qty 3

## 2024-03-18 MED ORDER — HYDROXYZINE HCL 10 MG PO TABS
10.0000 mg | ORAL_TABLET | Freq: Three times a day (TID) | ORAL | Status: DC | PRN
  Administered 2024-03-18 – 2024-03-19 (×2): 10 mg via ORAL
  Filled 2024-03-18 (×2): qty 1

## 2024-03-18 MED ORDER — LISINOPRIL 20 MG PO TABS
40.0000 mg | ORAL_TABLET | Freq: Every morning | ORAL | Status: DC
  Filled 2024-03-18: qty 2

## 2024-03-18 MED ORDER — FLUTICASONE PROPIONATE 50 MCG/ACT NA SUSP
2.0000 | Freq: Every day | NASAL | Status: DC
  Administered 2024-03-18 – 2024-03-19 (×2): 2 via NASAL
  Filled 2024-03-18: qty 16

## 2024-03-18 NOTE — Evaluation (Addendum)
 Physical Therapy Evaluation Patient Details Name: Thomas Ferrell MRN: [id number] DOB: [birth year] Today's Date: 03/18/2024  History of Present Illness  83 y.o. male presents to [facility] 03/17/24 due to worsening SOB and palpitations. Admitted with COPD exacerbation and a-fib with occasional RVR. PMHx: PAD, carotid artery stenosis, hypertension, hyperlipidemia, prior history of tobacco abuse, stroke/TIA, COPD, anemia   Clinical Impression  Pt supine in stretcher upon arrival with wife present and agreeable to PT eval. PTA, pt would perform lateral scoots into transport chair with wife pushing chair for mobility. Pt has not walked in months due to low back and LLE pain. In today's session, pt required MinA/CGA for bed mobility. Pt was able to stand with ModAx2 for safety with PT/OT blocking feet to prevent anterior slide with pt unable to take lateral steps. Deferred transfer as there were no drop-arm recliners or chairs in the ED. Pt will have 24/7 physical assist upon return home. Recommending HHPT to work on gaining independence with mobility. Pt currently with functional limitations due to the deficits listed below (see PT Problem List). Pt would benefit from acute skilled PT to address functional impairments. Acute PT to follow.    At rest- 99% SpO2 on RA During transfer- 90% SpO2 on RA      If plan is discharge home, recommend the following: A lot of help with walking and/or transfers;A lot of help with bathing/dressing/bathroom;Assistance with cooking/housework;Assist for transportation;Help with stairs or ramp for entrance     Equipment Recommendations None recommended by PT     Functional Status Assessment Patient has had a recent decline in their functional status and demonstrates the ability to make significant improvements in function in a reasonable and predictable amount of time.     Precautions / Restrictions Precautions Precautions: Fall Precaution/Restrictions Comments: Watch O2, L  foot drop, has brace but does not use it Restrictions Weight Bearing Restrictions Per Provider Order: No      Mobility  Bed Mobility Overal bed mobility: Needs Assistance Bed Mobility: Supine to Sit, Sit to Supine     Supine to sit: Min assist Sit to supine: Contact guard assist   General bed mobility comments: MinA via 1HH to raise trunk. Assist for balance once in sitting. CGA for safety for return to supine    Transfers Overall transfer level: Needs assistance Equipment used: 2 person hand held assist Transfers: Sit to/from Stand Sit to Stand: Mod assist, +2 safety/equipment      General transfer comment: ModAx2 for slight raise and balance once in standing. Blocking of B feet to prevent forward slide with pt holding onto PT/OT posterior elbow. Pt unable to take lateral side-steps    Ambulation/Gait  General Gait Details: deferred    Balance Overall balance assessment: Needs assistance, Mild deficits observed, not formally tested Sitting-balance support: Bilateral upper extremity supported, Feet unsupported Sitting balance-Leahy Scale: Fair Sitting balance - Comments: ModA to CGA for balance once seated at EOB. Unable to have feet flat due to high stretcher height. Postural control: Posterior lean Standing balance support: Bilateral upper extremity supported, During functional activity, Reliant on assistive device for balance Standing balance-Leahy Scale: Poor Standing balance comment: reliant on UE and external support         Pertinent Vitals/Pain Pain Assessment Pain Assessment: Faces Pain Score: 7  Pain Location: chronic lower back pain Pain Descriptors / Indicators: Aching, Discomfort Pain Intervention(s): Limited activity within patient's tolerance, Monitored during session, Repositioned    Home Living Family/patient expects to be discharged  to:: Private residence Living Arrangements: Spouse/significant other (80 lbs lab) Available Help at Discharge:  Family;Available 24 hours/day Type of Home: House Home Access: Ramped entrance       Home Layout: One level Home Equipment: Building control surveyor (4 wheels);Shower seat;Grab bars - tub/shower;Hand held shower head      Prior Function Prior Level of Function : Needs assist    Mobility Comments: laterally scoots to transport chair for mobility, wife assists with bed mobility and pushes transport chair ADLs Comments: wife will bring socks/underwear/pants to knees for pt to pull on     Extremity/Trunk Assessment   Upper Extremity Assessment Upper Extremity Assessment: Defer to OT evaluation    Lower Extremity Assessment Lower Extremity Assessment: RLE deficits/detail;LLE deficits/detail RLE Deficits / Details: Holds R LE in knee extension when in supine. At least 3/5 RLE Sensation: WNL LLE Deficits / Details: limited ankle DF/PF, at least 3/5 LLE Sensation: WNL       Communication   Communication Communication: No apparent difficulties    Cognition Arousal: Alert Behavior During Therapy: WFL for tasks assessed/performed   PT - Cognitive impairments: No apparent impairments    Following commands: Intact       Cueing Cueing Techniques: Verbal cues, Tactile cues      PT Assessment Patient needs continued PT services  PT Problem List Decreased strength;Decreased range of motion;Decreased activity tolerance;Decreased balance;Decreased mobility       PT Treatment Interventions DME instruction;Gait training;Functional mobility training;Therapeutic activities;Therapeutic exercise;Balance training;Neuromuscular re-education;Patient/family education    PT Goals (Current goals can be found in the Care Plan section)  Acute Rehab PT Goals Patient Stated Goal: to be able to walk again PT Goal Formulation: With patient/family Time For Goal Achievement: 04/01/24 Potential to Achieve Goals: Fair    Frequency Min 2X/week     Co-evaluation   Reason for Co-Treatment:  For patient/therapist safety;To address functional/ADL transfers PT goals addressed during session: Mobility/safety with mobility;Balance         AM-PAC PT "6 Clicks" Mobility  Outcome Measure Help needed turning from your back to your side while in a flat bed without using bedrails?: A Little Help needed moving from lying on your back to sitting on the side of a flat bed without using bedrails?: A Little Help needed moving to and from a bed to a chair (including a wheelchair)?: A Lot Help needed standing up from a chair using your arms (e.g., wheelchair or bedside chair)?: A Lot Help needed to walk in hospital room?: Total Help needed climbing 3-5 steps with a railing? : Total 6 Click Score: 12    End of Session   Activity Tolerance: Patient tolerated treatment well Patient left: in bed;with call bell/phone within reach;with family/visitor present Nurse Communication: Mobility status;Other (comment) (pt coughing after drinking water) PT Visit Diagnosis: Unsteadiness on feet (R26.81);Muscle weakness (generalized) (M62.81);Other abnormalities of gait and mobility (R26.89)    Time: 0454-0981 PT Time Calculation (min) (ACUTE ONLY): 32 min   Charges:   PT Evaluation $PT Eval Moderate Complexity: 1 Mod   PT General Charges $$ ACUTE PT VISIT: 1 Visit       Orysia Blas, PT, DPT Secure Chat Preferred  Rehab Office [phone]   Alissa April Adela Ades 03/18/2024, 10:03 AM

## 2024-03-18 NOTE — Evaluation (Signed)
 Clinical/Bedside Swallow Evaluation Patient Details  Name: Thomas Ferrell MRN: [id number] Date of Birth: 1941/08/27  Today's Date: 03/18/2024 Time: SLP Start Time (ACUTE ONLY): 1320 SLP Stop Time (ACUTE ONLY): 1331 SLP Time Calculation (min) (ACUTE ONLY): 11 min  Past Medical History:  Past Medical History:  Diagnosis Date   Abnormality of gait 03/03/2013   bladder ca dx'd 11/2009   chemo/xrt comp 02/2010   Cerebrovascular disease    right brain CVA 2007   COPD (chronic obstructive pulmonary disease) (HCC)    Cough productive of clear sputum    TX RECENT SINUS INFECTION    CVA (cerebral vascular accident) (HCC) 2007   r-cva   Dyslipidemia    Hypertension    Lumbago    Past Surgical History:  Past Surgical History:  Procedure Laterality Date   ABDOMINAL AORTAGRAM N/A 12/07/2013   Procedure: ABDOMINAL Tommi Fraise;  Surgeon: Dannis Dy, MD;  Location: [facility] CATH LAB;  Service: Cardiovascular;  Laterality: N/A;   ABDOMINAL AORTOGRAM W/LOWER EXTREMITY N/A 06/26/2023   Procedure: ABDOMINAL AORTOGRAM W/LOWER EXTREMITY;  Surgeon: Kayla Part, MD;  Location: [facility] INVASIVE CV LAB;  Service: Cardiovascular;  Laterality: N/A;   BACK SURGERY  2010   bladder cancer     CAROTID ARTERY ANGIOPLASTY  2009   CHOLECYSTECTOMY N/A 06/06/2016   Procedure: LAPAROSCOPIC CHOLECYSTECTOMY WITH INTRAOPERATIVE CHOLANGIOGRAM;  Surgeon: Oralee Billow, MD;  Location: [facility] ORS;  Service: General;  Laterality: N/A;   ERCP N/A 06/05/2016   Procedure: ENDOSCOPIC RETROGRADE CHOLANGIOPANCREATOGRAPHY (ERCP);  Surgeon: Delilah Fend, MD;  Location: [facility]OSCOPY;  Service: Endoscopy;  Laterality: N/A;   ESOPHAGOGASTRODUODENOSCOPY (EGD) WITH PROPOFOL  N/A 06/05/2016   Procedure: ESOPHAGOGASTRODUODENOSCOPY (EGD);  Surgeon: Delilah Fend, MD;  Location: [facility];  Service: Endoscopy;  Laterality: N/A;   FEMORAL-FEMORAL BYPASS GRAFT N/A 12/09/2013   Procedure: BYPASS GRAFT FEMORAL-FEMORAL ARTERY WITH BILATERAL ENDARTERECTOMY ;   Surgeon: Richrd Char, MD;  Location: [facility] OR;  Service: Vascular;  Laterality: N/A;   FEMORAL-POPLITEAL BYPASS GRAFT Left 12/09/2013   Procedure: BYPASS GRAFT FEMORAL-POPLITEAL ARTERY - LEFT ;  Surgeon: Richrd Char, MD;  Location: [facility] OR;  Service: Vascular;  Laterality: Left;   LAMINECTOMY  09/08/2013   L 2 L3 L4 L5       Dr Susen Epstein   LOWER EXTREMITY ANGIOGRAPHY Right 07/03/2023   Procedure: Lower Extremity Angiography;  Surgeon: Kayla Part, MD;  Location: [facility] INVASIVE CV LAB;  Service: Cardiovascular;  Laterality: Right;   PERIPHERAL VASCULAR BALLOON ANGIOPLASTY Right 07/03/2023   Procedure: PERIPHERAL VASCULAR BALLOON ANGIOPLASTY;  Surgeon: Kayla Part, MD;  Location: [facility] INVASIVE CV LAB;  Service: Cardiovascular;  Laterality: Right;  R external iliac   HPI:  Thomas Ferrell is an 83 yo male presenting to ED 5/6 with worsening SOB and palpitations. Admitted with COPD exacerbation and A-fib with occasional RVR. CXR shows mild lingular, mid R lung and bibasilar scarring and/or atelectasis. Failed [facility] swallow screen, prompting SLP consult. Most recently seen by SLP in 2017 with intermittent coughing which could not be separated from his baseline cough and was recommended to continue regular diet. PMH includes PAD, carotid artery stenosis, HTN, HLD, prior history of tobacco use, stroke/TIA, COPD, anemia    Assessment / Plan / Recommendation  Clinical Impression  Pt denies history of dysphagia and attributes his chronic cough to post nasal drip. No signs concerning for aspiration were observed with careful straw sips of water. Pt declined to be challenged further and stated he did not  wish to pursue further instrumental testing, with which his wife agrees. Recommend continuing current diet of regular solids with thin liquids. Provided education regarding aspiration precautions. SLP will s/o at this time. SLP Visit Diagnosis: Dysphagia, unspecified (R13.10)    Aspiration Risk  Mild  aspiration risk    Diet Recommendation Regular;Thin liquid    Liquid Administration via: Cup;Straw Medication Administration: Whole meds with liquid Supervision: Patient able to self feed Compensations: Slow rate;Small sips/bites Postural Changes: Seated upright at 90 degrees;Remain upright for at least 30 minutes after po intake    Other  Recommendations Oral Care Recommendations: Oral care BID    Recommendations for follow up therapy are one component of a multi-disciplinary discharge planning process, led by the attending physician.  Recommendations may be updated based on patient status, additional functional criteria and insurance authorization.  Follow up Recommendations No SLP follow up      Assistance Recommended at Discharge    Functional Status Assessment Patient has not had a recent decline in their functional status  Frequency and Duration            Prognosis Prognosis for improved oropharyngeal function: Good Barriers to Reach Goals: Time post onset      Swallow Study   General HPI: Thomas Ferrell is an 83 yo male presenting to ED 5/6 with worsening SOB and palpitations. Admitted with COPD exacerbation and A-fib with occasional RVR. CXR shows mild lingular, mid R lung and bibasilar scarring and/or atelectasis. Failed Yale swallow screen, prompting SLP consult. Most recently seen by SLP in 2017 with intermittent coughing which could not be separated from his baseline cough and was recommended to continue regular diet. PMH includes PAD, carotid artery stenosis, HTN, HLD, prior history of tobacco use, stroke/TIA, COPD, anemia Type of Study: Bedside Swallow Evaluation Previous Swallow Assessment: see HPI Diet Prior to this Study: Regular;Thin liquids (Level 0) Temperature Spikes Noted: No Respiratory Status: Nasal cannula History of Recent Intubation: No Behavior/Cognition: Alert;Cooperative Oral Cavity Assessment: Within Functional Limits Oral Care Completed by SLP:  No Oral Cavity - Dentition: Dentures, top;Dentures, bottom Vision: Functional for self-feeding Self-Feeding Abilities: Able to feed self Patient Positioning: Upright in bed Baseline Vocal Quality: Normal Volitional Cough: Strong Volitional Swallow: Able to elicit    Oral/Motor/Sensory Function Overall Oral Motor/Sensory Function: Within functional limits   Ice Chips Ice chips: Not tested   Thin Liquid Thin Liquid: Within functional limits Presentation: Straw;Self Fed    Nectar Thick Nectar Thick Liquid: Not tested   Honey Thick Honey Thick Liquid: Not tested   Puree Puree: Within functional limits Presentation: Spoon;Self Fed   Solid     Solid: Within functional limits Presentation: Self Fed      Amil Kale, M.A., CCC-SLP Speech Language Pathology, Acute Rehabilitation Services  Secure Chat preferred [phone]  03/18/2024,1:49 PM

## 2024-03-18 NOTE — Evaluation (Addendum)
 Occupational Therapy Evaluation Patient Details Name: Thomas Ferrell MRN: [id number] DOB: 01-10-1941 Today's Date: 03/18/2024   History of Present Illness   83 y.o. male presents to [facility] 03/17/24 due to worsening SOB and palpitations. Admitted with COPD exacerbation and a-fib with occasional RVR. PMHx: PAD, carotid artery stenosis, hypertension, hyperlipidemia, prior history of tobacco abuse, stroke/TIA, COPD, anemia     Clinical Impressions Pt typically gets around in a transport chair (wife pushes him). He has not walked for months, but is motivated to return to that level of independence. His wife assists with LB ADL (knees down) and he transfers himself in and out of bed as well as onto the toilet/shower chair. He has AE that assists him with some of his ADL. Today he presents very pleasant, very funny guy who is excited to work with therapy. He is currently min A for UB ADL, set up for grooming tasks, mod A for LB ADL, min A for bed mobility, mod A +2 safety for sit<>stand from ED stretcher. Today he was unable to side step up the stretcher even with +2 assist. Pt becomes SOB with effort of coming to the EOB with assist. Pt SpO2 was 98% on RA with no movement. During activity dropped down to 90% on RA, and with rest returns to upper 90's. At this time OT will provide skilled intervention acutely as well as afterwards at the [facility] level to maximize safety and independence in ADL and functional transfers, and continue education for AE/DME. Since he only has a transport chair, it would be beneficial for him to potentially have a WC that he could self-propel.  Of note: at end of session, Pt taking sips of drink and immediately coughing (intense), Pt stating that this happens often (especially in the morning) and that he's had several bouts of pneumonia. RN made aware and she is seeking out SLP swallow eval.     If plan is discharge home, recommend the following:   A lot of help with walking and/or  transfers;A lot of help with bathing/dressing/bathroom;Assistance with cooking/housework;Assist for transportation;Help with stairs or ramp for entrance     Functional Status Assessment   Patient has had a recent decline in their functional status and demonstrates the ability to make significant improvements in function in a reasonable and predictable amount of time.     Equipment Recommendations   Wheelchair (measurements OT);Wheelchair cushion (measurements OT)     Recommendations for Other Services   PT consult;Speech consult (coughing fit after drinkingt)     Precautions/Restrictions   Precautions Precautions: Fall Precaution/Restrictions Comments: Watch O2, L foot drop, has brace but does not use it Restrictions Weight Bearing Restrictions Per Provider Order: No     Mobility Bed Mobility Overal bed mobility: Needs Assistance Bed Mobility: Supine to Sit, Sit to Supine     Supine to sit: Min assist Sit to supine: Contact guard assist   General bed mobility comments: MinA via 1HH to raise trunk. Assist for balance once in sitting. CGA for safety for return to supine    Transfers Overall transfer level: Needs assistance Equipment used: 2 person hand held assist Transfers: Sit to/from Stand Sit to Stand: Mod assist, +2 safety/equipment           General transfer comment: ModAx2 for slight raise and balance once in standing. Blocking of B feet to prevent forward slide with pt holding onto PT/OT posterior elbow. Pt unable to take lateral side-steps      Balance Overall  balance assessment: Needs assistance, Mild deficits observed, not formally tested Sitting-balance support: Bilateral upper extremity supported, Feet unsupported Sitting balance-Leahy Scale: Fair Sitting balance - Comments: ModA to CGA for balance once seated at EOB. Unable to have feet flat due to high stretcher height. Postural control: Posterior lean Standing balance support: Bilateral  upper extremity supported, During functional activity, Reliant on assistive device for balance Standing balance-Leahy Scale: Poor Standing balance comment: reliant on UE and external support                           ADL either performed or assessed with clinical judgement   ADL Overall ADL's : Needs assistance/impaired Eating/Feeding: Independent   Grooming: Set up;Wash/dry face;Wash/dry hands;Sitting Grooming Details (indicate cue type and reason): sitting EOB Upper Body Bathing: Set up;Sitting   Lower Body Bathing: Set up;With adaptive equipment Lower Body Bathing Details (indicate cue type and reason): long handle sponge at baseline Upper Body Dressing : Minimal assistance;Sitting   Lower Body Dressing: Moderate assistance;With caregiver independent assisting;+2 for safety/equipment;Sit to/from stand Lower Body Dressing Details (indicate cue type and reason): currently requiring increased assist - wife typically assists with clothing items over feet Toilet Transfer: Moderate assistance;+2 for safety/equipment;+2 for physical assistance;Stand-pivot Toilet Transfer Details (indicate cue type and reason): Pt typically laterally transfers, today no access to drop arm from ED stretcher - mod A +2 safety to come to standing Toileting- Clothing Manipulation and Hygiene: Moderate assistance       Functional mobility during ADLs: Moderate assistance;+2 for safety/equipment       Vision Baseline Vision/History: 1 Wears glasses Patient Visual Report: No change from baseline       Perception         Praxis         Pertinent Vitals/Pain Pain Assessment Pain Assessment: Faces Pain Score: 7  Faces Pain Scale: Hurts a little bit Pain Location: chronic lower back pain Pain Descriptors / Indicators: Aching, Discomfort Pain Intervention(s): Monitored during session, Repositioned     Extremity/Trunk Assessment Upper Extremity Assessment Upper Extremity Assessment:  Overall WFL for tasks assessed   Lower Extremity Assessment Lower Extremity Assessment: Defer to PT evaluation RLE Deficits / Details: Holds R LE in knee extension when in supine. At least 3/5 RLE Sensation: WNL LLE Deficits / Details: limited ankle DF/PF, at least 3/5 LLE Sensation: WNL   Cervical / Trunk Assessment Cervical / Trunk Assessment: Other exceptions Cervical / Trunk Exceptions: history of back sx and chronic back pain "my spine bones are disentigrating"   Communication Communication Communication: No apparent difficulties   Cognition Arousal: Alert Behavior During Therapy: WFL for tasks assessed/performed Cognition: No apparent impairments             OT - Cognition Comments: some STM deficits which is baseline                 Following commands: Intact       Cueing  General Comments   Cueing Techniques: Verbal cues;Tactile cues  Wife Patty present and supportive. VSS throughout session on RA   Exercises     Shoulder Instructions      Home Living Family/patient expects to be discharged to:: Private residence Living Arrangements: Spouse/significant other (80 lbs lab) Available Help at Discharge: Family;Available 24 hours/day Type of Home: House Home Access: Ramped entrance     Home Layout: One level     Bathroom Shower/Tub: Tub/shower unit;Curtain   Firefighter: Standard Bathroom  Accessibility: Yes How Accessible:  (transport chair) Home Equipment: Building control surveyor (4 wheels);Shower seat;Grab bars - tub/shower;Hand held shower head;Adaptive equipment Adaptive Equipment: Reacher;Long-handled sponge        Prior Functioning/Environment Prior Level of Function : Needs assist             Mobility Comments: laterally scoots to transport chair for mobility, wife assists with bed mobility and pushes transport chair ADLs Comments: wife will bring socks/underwear/pants to knees for pt to pull on    OT Problem List:  Decreased activity tolerance;Impaired balance (sitting and/or standing);Decreased strength;Decreased knowledge of use of DME or AE;Decreased knowledge of precautions;Cardiopulmonary status limiting activity;Impaired sensation;Pain   OT Treatment/Interventions: Self-care/ADL training;Therapeutic exercise;DME and/or AE instruction;Energy conservation;Therapeutic activities;Patient/family education;Balance training      OT Goals(Current goals can be found in the care plan section)   Acute Rehab OT Goals Patient Stated Goal: breathe easier, get back to walking short distances OT Goal Formulation: With patient/family Time For Goal Achievement: 04/01/24 Potential to Achieve Goals: Good ADL Goals Pt Will Perform Grooming: with modified independence;sitting Pt Will Perform Upper Body Bathing: with modified independence;sitting;with adaptive equipment Pt Will Perform Lower Body Bathing: with modified independence;with adaptive equipment;sitting/lateral leans Pt Will Perform Upper Body Dressing: with modified independence;sitting Pt Will Perform Lower Body Dressing: with modified independence;with adaptive equipment;sitting/lateral leans Pt Will Transfer to Toilet: with supervision;regular height toilet Pt Will Perform Toileting - Clothing Manipulation and hygiene: with modified independence;sitting/lateral leans Additional ADL Goal #1: Pt will verbalize at least 3 ways of conserving energy during ADL with no cues   OT Frequency:  Min 2X/week    Co-evaluation PT/OT/SLP Co-Evaluation/Treatment: Yes Reason for Co-Treatment: For patient/therapist safety;To address functional/ADL transfers PT goals addressed during session: Mobility/safety with mobility;Balance;Strengthening/ROM OT goals addressed during session: ADL's and self-care;Strengthening/ROM      AM-PAC OT "6 Clicks" Daily Activity     Outcome Measure Help from another person eating meals?: None (concern about aspiration) Help from  another person taking care of personal grooming?: A Little Help from another person toileting, which includes using toliet, bedpan, or urinal?: A Lot Help from another person bathing (including washing, rinsing, drying)?: A Lot Help from another person to put on and taking off regular upper body clothing?: A Little Help from another person to put on and taking off regular lower body clothing?: A Lot 6 Click Score: 16   End of Session Equipment Utilized During Treatment: Gait belt Nurse Communication: Mobility status (concern for aspiration with drinking)  Activity Tolerance: Patient tolerated treatment well Patient left: with call bell/phone within reach;with family/visitor present (ED stretcher)  OT Visit Diagnosis: Unsteadiness on feet (R26.81);Muscle weakness (generalized) (M62.81)                Time: 4540-9811 OT Time Calculation (min): 32 min Charges:  OT General Charges $OT Visit: 1 Visit OT Evaluation $OT Eval Moderate Complexity: 1 Mod  [place] OTR/L Acute Rehabilitation Services Office: [phone]  Quincy Buba 03/18/2024, 12:35 PM

## 2024-03-18 NOTE — Progress Notes (Signed)
 PROGRESS NOTE    Thomas Ferrell  JYN:[id number] DOB: March 02, 1941 DOA: 03/17/2024 PCP: Wyn Heater, MD    Chief Complaint  Patient presents with   Shortness of Breath    Brief Narrative:  Patient is a 83 year old gentleman history of peripheral arterial disease and carotid artery stenosis with remote history of bilateral common femoral endarterectomy and bypass, hypertension, hyperlipidemia, prior history of tobacco use, stroke/TIA, COPD anemia presented with worsening shortness of breath occurring over a week with occasional palpitations.  Patient also with symptoms of orthopnea.  Patient seen in the ED noted to be in A-fib but rate controlled.  BNP elevated at 344.  Patient noted with a hypokalemia and hypomagnesemia.  Patient seen in consultation by cardiology placed on IV metoprolol , electrolytes repleted patient noted to have received IV Lasix  patient admitted with new onset A-fib with occasional RVR new onset CHF.   Assessment & Plan:   Principal Problem:   New onset a-fib (HCC) Active Problems:   PAD (peripheral artery disease) (HCC)   Acute heart failure (HCC)   COPD with acute exacerbation (HCC)   Hypokalemia   Essential hypertension   HLD (hyperlipidemia)   Anemia   Atrial fibrillation (HCC)  1 A-fib with occasional RVR -Patient presented with shortness of breath, noted to have some palpitations. - TSH noted at 1.7. - Electrolytes repleted with magnesium  at 2.6, potassium at 4.1. - Cardiac enzymes noted to be flattened and negative. - BNP of 344.5. - 2D echo with EF of 60 to 65%,NWMA, grade 1 diastolic dysfunction. - Patient seen in consultation by cardiology and started on IV metoprolol  and heparin  infusion. - Patient followed by cardiology and noted to be in normal sinus rhythm this morning and IV Lopressor  changed to Lopressor  12.5 mg p.o. twice daily. - Patient placed on Eliquis for anticoagulation. - Aspirin  discontinued per cardiology as patient started on  Eliquis for anticoagulation.  2.  Acute diastolic CHF exacerbation/acute HFrEF - Likely secondary to problem #1. Patient noted to have received IV Lasix  x 1 on presentation. -Patient with some symptomatic improvement. -2D echo with EF of 60 to 65%,NWMA, grade 1 diastolic dysfunction. - Patient started on Lopressor  12.5 mg p.o. twice daily per cardiology, continue Lipitor. - Per cardiology.  3.  Acute COPD exacerbation -Patient noted per admitting physician to be diffusely wheezing on examination. -Improving clinically. - Continue IV steroids. - Discontinue scheduled DuoNebs secondary to problem #1. - Placed on scheduled Xopenex and Atrovent nebs, Claritin, Flonase.  4.  Hypokalemia -Repleted.  5.  Peripheral arterial disease -Continue Lipitor, Plavix . - Aspirin  discontinued per cardiology as patient started on Eliquis.  6.  Chronic anemia -Follow H&H.  7.  Hypertension -Patient was on lisinopril  which has been discontinued by cardiology to make room for beta-blocker and diuresis. - Per cardiology.    DVT prophylaxis: Eliquis Code Status: Full Family Communication: Updated patient and wife at bedside. Disposition: TBD  Status is: Patient The patient will require care spanning > 2 midnights and should be moved to inpatient because: Severity of illness   Consultants:  Cardiology: Dr. Hayes Lipps 03/17/2024  Procedures:  2D echo 03/18/2024 Chest x-ray 03/17/2024   Antimicrobials:  Anti-infectives (From admission, onward)    None         Subjective: Sitting up on gurney. Denies any CP or SOB. Denies palpitations. Wife at bedside.  Objective: Vitals:   03/18/24 1129 03/18/24 1220 03/18/24 1330 03/18/24 1429  BP: (!) 155/89  (!) 162/61 (!) 157/71  Pulse: 83  89 82  Resp: (!) 27   15  Temp:  98.1 F (36.7 C)  98.1 F (36.7 C)  TempSrc:  Oral  Oral  SpO2: 94%  97% 93%  Weight:    55.8 kg  Height:    5\' 4"  (1.626 m)    Intake/Output Summary (Last 24 hours) at  03/18/2024 1804 Last data filed at 03/18/2024 1713 Gross per 24 hour  Intake 240 ml  Output 900 ml  Net -660 ml   Filed Weights   03/17/24 1516 03/18/24 1429  Weight: 53.5 kg 55.8 kg    Examination:  General exam: Appears calm and comfortable  Respiratory system: Bibasilar crackles.no wheezing.  No rhonchi, fair air movement, speaking in full sentences.   Cardiovascular system: S1 & S2 heard, RRR. No JVD, murmurs, rubs, gallops or clicks. No pedal edema. Gastrointestinal system: Abdomen is nondistended, soft and nontender. No organomegaly or masses felt. Normal bowel sounds heard. Central nervous system: Alert and oriented. No focal neurological deficits. Extremities: Symmetric 5 x 5 power. Skin: No rashes, lesions or ulcers Psychiatry: Judgement and insight appear normal. Mood & affect appropriate.     Data Reviewed: I have personally reviewed following labs and imaging studies  CBC: Recent Labs  Lab 03/17/24 1525 03/18/24 0418  WBC 7.7 10.2  HGB 10.9* 11.3*  HCT 33.2* 34.0*  MCV 91.7 91.9  PLT 229 233    Basic Metabolic Panel: Recent Labs  Lab 03/17/24 1525 03/17/24 1722 03/18/24 0418  NA 138  --  139  K 3.1*  --  4.1  CL 98  --  104  CO2 27  --  24  GLUCOSE 127*  --  142*  BUN 7*  --  7*  CREATININE 0.76  --  0.83  CALCIUM 8.8*  --  8.8*  MG  --  1.7 2.6*    GFR: Estimated Creatinine Clearance: 55.1 mL/min (by C-G formula based on SCr of 0.83 mg/dL).  Liver Function Tests: Recent Labs  Lab 03/18/24 0418  AST 25  ALT 15  ALKPHOS 101  BILITOT 0.8  PROT 6.5  ALBUMIN 3.6    CBG: No results for input(s): "GLUCAP" in the last 168 hours.   Recent Results (from the past 240 hours)  Resp panel by RT-PCR (RSV, Flu A&B, Covid) Anterior Nasal Swab     Status: None   Collection Time: 03/17/24  5:22 PM   Specimen: Anterior Nasal Swab  Result Value Ref Range Status   SARS Coronavirus 2 by RT PCR NEGATIVE NEGATIVE Final   Influenza A by PCR NEGATIVE  NEGATIVE Final   Influenza B by PCR NEGATIVE NEGATIVE Final    Comment: (NOTE) The Xpert Xpress SARS-CoV-2/FLU/RSV plus assay is intended as an aid in the diagnosis of influenza from Nasopharyngeal swab specimens and should not be used as a sole basis for treatment. Nasal washings and aspirates are unacceptable for Xpert Xpress SARS-CoV-2/FLU/RSV testing.  Fact Sheet for Patients: BloggerCourse.com  Fact Sheet for Healthcare Providers: SeriousBroker.it  This test is not yet approved or cleared by the [place]  FDA and has been authorized for detection and/or diagnosis of SARS-CoV-2 by FDA under an Emergency Use Authorization (EUA). This EUA will remain in effect (meaning this test can be used) for the duration of the COVID-19 declaration under Section 564(b)(1) of the Act, 21 U.S.C. section 360bbb-3(b)(1), unless the authorization is terminated or revoked.     Resp Syncytial Virus by PCR NEGATIVE NEGATIVE Final    Comment: (NOTE)  Fact Sheet for Patients: BloggerCourse.com  Fact Sheet for Healthcare Providers: SeriousBroker.it  This test is not yet approved or cleared by the [place]  FDA and has been authorized for detection and/or diagnosis of SARS-CoV-2 by FDA under an Emergency Use Authorization (EUA). This EUA will remain in effect (meaning this test can be used) for the duration of the COVID-19 declaration under Section 564(b)(1) of the Act, 21 U.S.C. section 360bbb-3(b)(1), unless the authorization is terminated or revoked.  Performed at [facility], [address] [place], [place] [address]          Radiology Studies: ECHOCARDIOGRAM COMPLETE Result Date: 03/18/2024    ECHOCARDIOGRAM REPORT   Patient Name:   Thomas Ferrell Date of Exam: 03/18/2024 Medical Rec #:  [id number]      Height:       64.0 in Accession #:    [id number]     Weight:       118.0  lb Date of Birth:  01-22-1941     BSA:          1.563 m Patient Age:    82 years       BP:           146/74 mmHg Patient Gender: M              HR:           86 bpm. Exam Location:  Inpatient Procedure: 2D Echo, Cardiac Doppler and Color Doppler (Both Spectral and Color            Flow Doppler were utilized during procedure). Indications:    Atrial Fibrillation I48.91  History:        Patient has prior history of Echocardiogram examinations, most                 recent 06/03/2016. COPD; Risk Factors:Hypertension.  Sonographer:    Astrid Blamer Referring Phys: [phone] ARSHAD N KAKRAKANDY IMPRESSIONS  1. Left ventricular ejection fraction, by estimation, is 60 to 65%. The left ventricle has normal function. The left ventricle has no regional wall motion abnormalities. Left ventricular diastolic parameters are consistent with Grade I diastolic dysfunction (impaired relaxation).  2. Right ventricular systolic function is normal. The right ventricular size is normal. There is mildly elevated pulmonary artery systolic pressure.  3. The mitral valve is normal in structure. No evidence of mitral valve regurgitation. No evidence of mitral stenosis.  4. The aortic valve is calcified. There is moderate calcification of the aortic valve. There is moderate thickening of the aortic valve. Aortic valve regurgitation is not visualized. Mild aortic valve stenosis. Aortic valve mean gradient measures 9.5 mmHg. Aortic valve Vmax measures 2.00 m/s.  5. The inferior vena cava is normal in size with greater than 50% respiratory variability, suggesting right atrial pressure of 3 mmHg. FINDINGS  Left Ventricle: Left ventricular ejection fraction, by estimation, is 60 to 65%. The left ventricle has normal function. The left ventricle has no regional wall motion abnormalities. The left ventricular internal cavity size was normal in size. There is  no left ventricular hypertrophy. Left ventricular diastolic parameters are consistent with Grade I  diastolic dysfunction (impaired relaxation). Right Ventricle: The right ventricular size is normal. No increase in right ventricular wall thickness. Right ventricular systolic function is normal. There is mildly elevated pulmonary artery systolic pressure. The tricuspid regurgitant velocity is 2.94  m/s, and with an assumed right atrial pressure of 3 mmHg, the estimated right ventricular systolic pressure is  37.6 mmHg. Left Atrium: Left atrial size was normal in size. Right Atrium: Right atrial size was normal in size. Pericardium: There is no evidence of pericardial effusion. Mitral Valve: The mitral valve is normal in structure. No evidence of mitral valve regurgitation. No evidence of mitral valve stenosis. MV peak gradient, 8.5 mmHg. The mean mitral valve gradient is 6.0 mmHg. Tricuspid Valve: The tricuspid valve is normal in structure. Tricuspid valve regurgitation is trivial. No evidence of tricuspid stenosis. Aortic Valve: The aortic valve is calcified. There is moderate calcification of the aortic valve. There is moderate thickening of the aortic valve. Aortic valve regurgitation is not visualized. Mild aortic stenosis is present. Aortic valve mean gradient measures 9.5 mmHg. Aortic valve peak gradient measures 16.0 mmHg. Aortic valve area, by VTI measures 0.96 cm. Pulmonic Valve: The pulmonic valve was normal in structure. Pulmonic valve regurgitation is not visualized. No evidence of pulmonic stenosis. Aorta: The aortic root is normal in size and structure. Venous: The inferior vena cava is normal in size with greater than 50% respiratory variability, suggesting right atrial pressure of 3 mmHg. IAS/Shunts: No atrial level shunt detected by color flow Doppler.  LEFT VENTRICLE PLAX 2D LVIDd:         4.00 cm   Diastology LVIDs:         2.10 cm   LV e' medial:    9.14 cm/s LV PW:         0.90 cm   LV E/e' medial:  11.6 LV IVS:        0.80 cm   LV e' lateral:   9.68 cm/s LVOT diam:     1.80 cm   LV E/e'  lateral: 11.0 LV SV:         42 LV SV Index:   27 LVOT Area:     2.54 cm  RIGHT VENTRICLE RV S prime:     15.90 cm/s TAPSE (M-mode): 3.1 cm LEFT ATRIUM             Index        RIGHT ATRIUM           Index LA Vol (A2C):   26.9 ml 17.21 ml/m  RA Area:     14.20 cm LA Vol (A4C):   35.0 ml 22.39 ml/m  RA Volume:   28.90 ml  18.49 ml/m LA Biplane Vol: 32.2 ml 20.60 ml/m  AORTIC VALVE AV Area (Vmax):    1.37 cm AV Area (Vmean):   1.17 cm AV Area (VTI):     0.96 cm AV Vmax:           200.00 cm/s AV Vmean:          146.500 cm/s AV VTI:            0.442 m AV Peak Grad:      16.0 mmHg AV Mean Grad:      9.5 mmHg LVOT Vmax:         108.00 cm/s LVOT Vmean:        67.200 cm/s LVOT VTI:          0.167 m LVOT/AV VTI ratio: 0.38  AORTA Ao Root diam: 2.40 cm MITRAL VALVE                TRICUSPID VALVE MV Area (PHT): 3.76 cm     TR Peak grad:   34.6 mmHg MV Area VTI:   1.35 cm     TR Vmax:  294.00 cm/s MV Peak grad:  8.5 mmHg MV Mean grad:  6.0 mmHg     SHUNTS MV Vmax:       1.46 m/s     Systemic VTI:  0.17 m MV Vmean:      117.0 cm/s   Systemic Diam: 1.80 cm MV Decel Time: 202 msec MV E velocity: 106.00 cm/s MV A velocity: 123.00 cm/s MV E/A ratio:  0.86 Dorothye Gathers MD Electronically signed by Dorothye Gathers MD Signature Date/Time: 03/18/2024/4:18:25 PM    Final    DG Chest 2 View Result Date: 03/17/2024 CLINICAL DATA:  Shortness of breath for 1 week. EXAM: CHEST - 2 VIEW COMPARISON:  June 27, 2016 FINDINGS: The heart size and mediastinal contours are within normal limits. Marked severity calcification of the thoracic aorta is present. There is evidence of emphysematous lung disease with mild, diffuse, chronic appearing increased interstitial lung markings also noted. Mild lingular, mid right lung and bibasilar scarring and/or atelectasis is present. There is no evidence of focal consolidation, pleural effusion or pneumothorax. The visualized skeletal structures are unremarkable. IMPRESSION: 1. Emphysematous lung  disease with mild, diffuse, chronic appearing increased interstitial lung markings. 2. Mild lingular, mid right lung and bibasilar scarring and/or atelectasis. Electronically Signed   By: Virgle Grime M.D.   On: 03/17/2024 16:24        Scheduled Meds:  apixaban  2.5 mg Oral BID   atorvastatin  20 mg Oral Daily   clopidogrel   75 mg Oral Daily   cyanocobalamin  500 mcg Oral Daily   fluticasone  2 spray Each Nare Daily   levalbuterol  0.63 mg Nebulization TID   loratadine  10 mg Oral Daily   methylPREDNISolone  (SOLU-MEDROL ) injection  80 mg Intravenous Daily   metoprolol  tartrate  12.5 mg Oral BID   pantoprazole   40 mg Oral Q0600   sertraline   50 mg Oral Daily   Continuous Infusions:   LOS: 0 days    Time spent: 40 minutes    Hilda Lovings, MD [facility]   To contact the attending provider between 7A-7P or the covering provider during after hours 7P-7A, please log into the web site [email or web address] and access using universal [place] password for that web site. If you do not have the password, please call the hospital operator.  03/18/2024, 6:04 PM

## 2024-03-18 NOTE — Progress Notes (Addendum)
 Rounding Note    Patient Name: Thomas Ferrell Date of Encounter: 03/18/2024  [facility] Cardiologist: Ahmad Alert, MD   Subjective   He is in SR. He does not ambulate due to back/disc issues causing pain in his left leg. Still dyspneic when working with PT. Upset about being in the ER.   Inpatient Medications    Scheduled Meds:  atorvastatin  20 mg Oral Daily   clopidogrel   75 mg Oral Daily   cyanocobalamin  500 mcg Oral Daily   furosemide   40 mg Intravenous Once   levalbuterol  0.63 mg Nebulization Q6H   loratadine  10 mg Oral Daily   methylPREDNISolone  (SOLU-MEDROL ) injection  80 mg Intravenous Daily   metoprolol  tartrate  12.5 mg Oral BID   pantoprazole   40 mg Oral Q0600   sertraline   50 mg Oral Daily   Continuous Infusions:  heparin  750 Units/hr (03/18/24 0038)   PRN Meds: ipratropium   Vital Signs    Vitals:   03/18/24 0530 03/18/24 0600 03/18/24 0700 03/18/24 0800  BP: (!) 154/65 138/61 (!) 142/121 108/86  Pulse: 81 84 100 (!) 105  Resp: 20 19 20 20   Temp:      TempSrc:      SpO2: 95% 95% 97% 94%  Weight:      Height:       No intake or output data in the 24 hours ending 03/18/24 1000    03/17/2024    3:16 PM 02/20/2024   11:29 AM 11/21/2023    2:33 PM  Last 3 Weights  Weight (lbs) 118 lb 118 lb 118 lb 9.6 oz  Weight (kg) 53.524 kg 53.524 kg 53.797 kg      Telemetry    Sinus rhythm in the 80s - Personally Reviewed  ECG    No new tracings - Personally Reviewed  Physical Exam   GEN: No acute distress, elderly male   Neck: No JVD Cardiac: RRR, no murmurs, rubs, or gallops.  Respiratory: Clear to auscultation bilaterally. GI: Soft, nontender, non-distended  MS: No edema; No deformity. Neuro:  Nonfocal  Psych: Normal affect   Labs    High Sensitivity Troponin:   Recent Labs  Lab 03/17/24 1525 03/17/24 1722  TROPONINIHS 6 6     Chemistry Recent Labs  Lab 03/17/24 1525 03/17/24 1722 03/18/24 0418  NA 138  --  139  K  3.1*  --  4.1  CL 98  --  104  CO2 27  --  24  GLUCOSE 127*  --  142*  BUN 7*  --  7*  CREATININE 0.76  --  0.83  CALCIUM 8.8*  --  8.8*  MG  --  1.7 2.6*  PROT  --   --  6.5  ALBUMIN  --   --  3.6  AST  --   --  25  ALT  --   --  15  ALKPHOS  --   --  101  BILITOT  --   --  0.8  GFRNONAA >60  --  >60  ANIONGAP 13  --  11    Lipids No results for input(s): "CHOL", "TRIG", "HDL", "LABVLDL", "LDLCALC", "CHOLHDL" in the last 168 hours.  Hematology Recent Labs  Lab 03/17/24 1525 03/18/24 0418  WBC 7.7 10.2  RBC 3.62* 3.70*  HGB 10.9* 11.3*  HCT 33.2* 34.0*  MCV 91.7 91.9  MCH 30.1 30.5  MCHC 32.8 33.2  RDW 13.3 13.5  PLT 229 233  Thyroid   Recent Labs  Lab 03/17/24 1722  TSH 1.741    BNP Recent Labs  Lab 03/17/24 1525  BNP 344.5*    DDimer No results for input(s): "DDIMER" in the last 168 hours.   Radiology    DG Chest 2 View Result Date: 03/17/2024 CLINICAL DATA:  Shortness of breath for 1 week. EXAM: CHEST - 2 VIEW COMPARISON:  June 27, 2016 FINDINGS: The heart size and mediastinal contours are within normal limits. Marked severity calcification of the thoracic aorta is present. There is evidence of emphysematous lung disease with mild, diffuse, chronic appearing increased interstitial lung markings also noted. Mild lingular, mid right lung and bibasilar scarring and/or atelectasis is present. There is no evidence of focal consolidation, pleural effusion or pneumothorax. The visualized skeletal structures are unremarkable. IMPRESSION: 1. Emphysematous lung disease with mild, diffuse, chronic appearing increased interstitial lung markings. 2. Mild lingular, mid right lung and bibasilar scarring and/or atelectasis. Electronically Signed   By: Virgle Grime M.D.   On: 03/17/2024 16:24    Cardiac Studies   Echo pending  Patient Profile     83 y.o. male with a hx of PAD (previous bilateral common femoral endarterectomies, right to left femoral-femoral bypass  and left femoral to above knee popliteal bypass with Propaten on 12/09/13 by Dr. Nolene Baumgarten for ischemic left leg), Carotid artery disease (history of right CEA on 10/21/06 by Dr. Nolene Baumgarten for symptomatic carotid stenosis), previous CVA/TIA, HTN, HLD, COPD, bladder cancer, previous spinal injury who is being seen for the evaluation of new atrial fibrillation.  Assessment & Plan    Afib with RVR - newly recognized this admission - generally rate controlled, started on lopressor  12.5 mg BID - TSH WNL, Mg 2.6 - new diagnosis this admission, but symptoms x 1 week - SR currently, consider increasing lopressor  after diuresis   Acute congestive heart failure - BNP 344 - scheduled to receive 40 mg IV lasix  x 1 dose - suspect secondary to Afib - echo pending, preserved EF in 2017 - agree with diuresis   PAD - last intervention 06/2023 Carotid artery disease - on ASA and plavix  - will likely need to discontinue one antiplatelet agent for [facility] - s/p drug coated balloon angioplasty to distal external iliac artery 06/2023, hx of fem-fem bypass, fem-pop bypass - need to reach to [facility] for guidance on triple therapy vs discontinuation of ASA to allow for OAC with eliquis for stroke PPX   Hypertension - on 40 mg lisinopril  - consider holding this for BB and diuresis - I have discontinued lisinopril  for  now, would benefit, but likely will need lower dose     For questions or updates, please contact  [facility] Please consult [email or web address] for contact info under        Signed, Lamond Pilot, PA  03/18/2024, 10:00 AM     Attending Note:   The patient was seen and examined.  Agree with assessment and plan as noted above.  Changes made to the above note as needed.  Patient seen and independently examined with Romualdo Cobble, PA .   We discussed all aspects of the encounter. I agree with the assessment and plan as stated above.    Atrial fib :  HR is fairly well controlled.  Is now on  metoprolol  12.5 BID  He is on ASA and plavix  for PV stents and carotid artery disease.   Will DC asa,  continue plavix , Add Eliquis 2.5 BID ( age 77, wt is  53 kg) HR is well controlled.       I have spent a total of 40 minutes with patient reviewing hospital  notes , telemetry, EKGs, labs and examining patient as well as establishing an assessment and plan that was discussed with the patient.  > 50% of time was spent in direct patient care.    [place], Marieta Shorten., MD, [facility] 03/18/2024, 11:17 AM [address]  [address] Office [phone] Pager [phone]

## 2024-03-18 NOTE — Progress Notes (Addendum)
 PHARMACY - ANTICOAGULATION CONSULT NOTE  Pharmacy Consult for heparin   Indication: atrial fibrillation  Allergies  Allergen Reactions   Hydrocodone  Other (See Comments)    hallucinations   Morphine  And Codeine Itching    Patient Measurements: Height: 5\' 4"  (162.6 cm) Weight: 53.5 kg (118 lb) IBW/kg (Calculated) : 59.2 HEPARIN  DW (KG): 53.5  Vital Signs: Temp: 98.6 F (37 C) (05/07 0352) Temp Source: Oral (05/07 0352) BP: 153/81 (05/07 1023) Pulse Rate: 85 (05/07 1023)  Labs: Recent Labs    03/17/24 1525 03/17/24 1722 03/18/24 0418 03/18/24 0940  HGB 10.9*  --  11.3*  --   HCT 33.2*  --  34.0*  --   PLT 229  --  233  --   HEPARINUNFRC  --   --  0.27* 0.10*  CREATININE 0.76  --  0.83  --   TROPONINIHS 6 6  --   --     Estimated Creatinine Clearance: 52.8 mL/min (by C-G formula based on SCr of 0.83 mg/dL).   Medical History: Past Medical History:  Diagnosis Date   Abnormality of gait 03/03/2013   bladder ca dx'd 11/2009   chemo/xrt comp 02/2010   Cerebrovascular disease    right brain CVA 2007   COPD (chronic obstructive pulmonary disease) (HCC)    Cough productive of clear sputum    TX RECENT SINUS INFECTION    CVA (cerebral vascular accident) (HCC) 2007   r-cva   Dyslipidemia    Hypertension    Lumbago     Assessment: Patient presenting with new onset Afib with occasional RVR. Not on anticoagulation PTA. Potential TEE/DCCV in the morning. HgB 10.9 and PLTs 229. Pharmacy consulted to dose heparin  infusion.   Heparin  level subtherapeutic this AM on 750 units/hr  Goal of Therapy:  Heparin  level 0.3-0.7 units/ml Monitor platelets by anticoagulation protocol: Yes   Plan:  Increase heparin  gtt to 900 units/hr F/u 8 hour heparin  level F/u long term AC plan  Addendum: Now to switch to Eliquis  Trinidad Funk, PharmD, [facility] Clinical Pharmacist ED Pharmacist Phone # [phone] 03/18/2024 11:00 AM

## 2024-03-18 NOTE — Telephone Encounter (Signed)
 Patient Product/process development scientist completed.    The patient is insured through [facility]. Patient has Medicare and is not eligible for a copay card, but may be able to apply for patient assistance or Medicare RX Payment Plan (Patient Must reach out to their plan, if eligible for payment plan), if available.    Ran test claim for Eliquis 5 mg and the current 30 day co-pay is $302.00 due to a $255.00 deductible.  Will be $47.00 once deductible is met.   This test claim was processed through [facility]- copay amounts may vary at other pharmacies due to pharmacy/plan contracts, or as the patient moves through the different stages of their insurance plan.     Roland Earl, CPHT Pharmacy Technician III Certified Patient Advocate [facility] Patient Advocate Team Direct Number: [phone]  Fax: [phone]

## 2024-03-19 ENCOUNTER — Other Ambulatory Visit (HOSPITAL_COMMUNITY): Payer: Self-pay

## 2024-03-19 DIAGNOSIS — I10 Essential (primary) hypertension: Secondary | ICD-10-CM | POA: Diagnosis not present

## 2024-03-19 DIAGNOSIS — I4891 Unspecified atrial fibrillation: Secondary | ICD-10-CM | POA: Diagnosis not present

## 2024-03-19 DIAGNOSIS — J441 Chronic obstructive pulmonary disease with (acute) exacerbation: Secondary | ICD-10-CM | POA: Diagnosis not present

## 2024-03-19 DIAGNOSIS — I739 Peripheral vascular disease, unspecified: Secondary | ICD-10-CM | POA: Diagnosis not present

## 2024-03-19 LAB — CBC
HCT: 34.4 % — ABNORMAL LOW (ref 39.0–52.0)
Hemoglobin: 11.5 g/dL — ABNORMAL LOW (ref 13.0–17.0)
MCH: 30.7 pg (ref 26.0–34.0)
MCHC: 33.4 g/dL (ref 30.0–36.0)
MCV: 91.7 fL (ref 80.0–100.0)
Platelets: 326 10*3/uL (ref 150–400)
RBC: 3.75 MIL/uL — ABNORMAL LOW (ref 4.22–5.81)
RDW: 13.7 % (ref 11.5–15.5)
WBC: 13.9 10*3/uL — ABNORMAL HIGH (ref 4.0–10.5)
nRBC: 0 % (ref 0.0–0.2)

## 2024-03-19 LAB — BASIC METABOLIC PANEL WITH GFR
Anion gap: 15 (ref 5–15)
BUN: 19 mg/dL (ref 8–23)
CO2: 24 mmol/L (ref 22–32)
Calcium: 9.1 mg/dL (ref 8.9–10.3)
Chloride: 97 mmol/L — ABNORMAL LOW (ref 98–111)
Creatinine, Ser: 1.08 mg/dL (ref 0.61–1.24)
GFR, Estimated: >60 mL/min (ref >60)
Glucose, Bld: 122 mg/dL — ABNORMAL HIGH (ref 70–99)
Potassium: 4.2 mmol/L (ref 3.5–5.1)
Sodium: 136 mmol/L (ref 135–145)

## 2024-03-19 LAB — HIV ANTIBODY (ROUTINE TESTING W REFLEX): HIV Screen 4th Generation wRfx: NONREACTIVE

## 2024-03-19 LAB — MAGNESIUM: Magnesium: 2.2 mg/dL (ref 1.7–2.4)

## 2024-03-19 MED ORDER — LORATADINE 10 MG PO TABS: 10.0000 mg | ORAL_TABLET | Freq: Every day | ORAL | Status: DC

## 2024-03-19 MED ORDER — DULERA 200-5 MCG/ACT IN AERO
2.0000 | INHALATION_SPRAY | Freq: Two times a day (BID) | RESPIRATORY_TRACT | 1 refills | Status: AC
  Filled 2024-03-19 (×2): qty 13, 30d supply, fill #0

## 2024-03-19 MED ORDER — LEVALBUTEROL TARTRATE 45 MCG/ACT IN AERO
INHALATION_SPRAY | RESPIRATORY_TRACT | 2 refills | Status: AC
  Filled 2024-03-19: qty 15, 17d supply, fill #0

## 2024-03-19 MED ORDER — PANTOPRAZOLE SODIUM 40 MG PO TBEC
40.0000 mg | DELAYED_RELEASE_TABLET | Freq: Every day | ORAL | 3 refills | Status: AC
  Filled 2024-03-19: qty 90, 90d supply, fill #0

## 2024-03-19 MED ORDER — ADULT MULTIVITAMIN W/MINERALS CH: 1.0000 | ORAL_TABLET | Freq: Every day | ORAL | Status: DC

## 2024-03-19 MED ORDER — APIXABAN 2.5 MG PO TABS
2.5000 mg | ORAL_TABLET | Freq: Two times a day (BID) | ORAL | 3 refills | Status: DC
Start: 2024-03-19 — End: 2024-04-07
  Filled 2024-03-19: qty 60, 30d supply, fill #0

## 2024-03-19 MED ORDER — FLUTICASONE PROPIONATE 50 MCG/ACT NA SUSP: 2.0000 | Freq: Every day | NASAL | Status: DC

## 2024-03-19 MED ORDER — METOPROLOL TARTRATE 25 MG PO TABS
12.5000 mg | ORAL_TABLET | Freq: Two times a day (BID) | ORAL | 3 refills | Status: AC
  Filled 2024-03-19: qty 90, 90d supply, fill #0

## 2024-03-19 MED ORDER — ENSURE ENLIVE PO LIQD: 237.0000 mL | Freq: Two times a day (BID) | ORAL | Status: DC

## 2024-03-19 MED ORDER — IRBESARTAN 150 MG PO TABS
150.0000 mg | ORAL_TABLET | Freq: Every day | ORAL | 3 refills | Status: DC
Start: 2024-03-19 — End: 2024-04-07
  Filled 2024-03-19: qty 90, 90d supply, fill #0

## 2024-03-19 MED ORDER — PREDNISONE 20 MG PO TABS
ORAL_TABLET | ORAL | 0 refills | Status: AC
  Filled 2024-03-19: qty 18, 9d supply, fill #0

## 2024-03-19 MED ORDER — PREDNISONE 20 MG PO TABS: 60.0000 mg | ORAL_TABLET | Freq: Every day | ORAL | Status: DC

## 2024-03-19 NOTE — Plan of Care (Signed)
  Problem: Health Behavior/Discharge Planning: Goal: Ability to manage health-related needs will improve Outcome: Progressing   Problem: Education: Goal: Knowledge of General Education information will improve Description: Including pain rating scale, medication(s)/side effects and non-pharmacologic comfort measures Outcome: Progressing   Problem: Clinical Measurements: Goal: Ability to maintain clinical measurements within normal limits will improve Outcome: Progressing   Problem: Nutrition: Goal: Adequate nutrition will be maintained Outcome: Progressing   Problem: Coping: Goal: Level of anxiety will decrease Outcome: Progressing

## 2024-03-19 NOTE — Discharge Summary (Signed)
 Physician Discharge Summary  VANNIE CHAPLA ZOX:[id number] DOB: 02-10-1941 DOA: 03/17/2024  PCP: Wyn Heater, MD  Admit date: 03/17/2024 Discharge date: 03/19/2024  Time spent: 60 minutes  Recommendations for Outpatient Follow-up:  Follow-up with Wyn Heater, MD on 04/01/2024 at 10 AM.  On follow-up patient will need a basic metabolic profile done to follow-up on electrolytes and renal function.  Patient COPD will need to be followed up upon.  Patient's hypertension will also need to be followed up upon. Follow-up with Dr. Alroy Aspen, cardiology in 3 to 4 weeks. Follow-up in A-fib clinic in 3 weeks.   Discharge Diagnoses:  Principal Problem:   New onset a-fib (HCC) Active Problems:   PAD (peripheral artery disease) (HCC)   Acute heart failure (HCC)   COPD with acute exacerbation (HCC)   Hypokalemia   Essential hypertension   HLD (hyperlipidemia)   Anemia   Atrial fibrillation (HCC)   Discharge Condition: Stable and improved.  Diet recommendation: Heart healthy  Filed Weights   03/17/24 1516 03/18/24 1429 03/19/24 0541  Weight: 53.5 kg 55.8 kg 53.9 kg    History of present illness:  HPI per Dr. Eladio Graven is a 83 y.o. male with history of peripheral arterial disease and carotid artery stenosis with remote history of bilateral common femoral endarterectomy and bypass, hypertension, hyperlipidemia, prior history of tobacco abuse, stroke/TIA, COPD, anemia presents to the ER because of shortness of breath.  Patient states he has been short of breath for some time but has acutely worsened over the last 1 week with some occasional palpitations.  Denies any chest pain.  Patient is at baseline nonambulatory due to his neuropathy.  Patient also has symptoms of orthopnea.  Denies any productive cough fever or chills.  Patient had gone to his PCPs office and was referred to the ER.   ED Course: In the ER patient is found to be in A-fib but rate controlled.  Mildly hypoxic  requiring 2 L oxygen .  Labs show BNP of 344 troponin of 6 potassium of 3.1 magnesium  of 1.7.  Hemoglobin 10.9.  Cardiology was consulted patient was placed on IV metoprolol  and potassium and magnesium  replacements ordered and cardiology planning to give Lasix  once electrolyte corrected.  Patient admitted for new onset A-fib with occasional RVR with new onset CHF.  Hospital Course:  1 A-fib with occasional RVR -Patient presented with shortness of breath, noted to have some palpitations. - TSH noted at 1.7. - Electrolytes repleted with magnesium  at 2.6, potassium at 4.1. - Cardiac enzymes noted to be flattened and negative. - BNP of 344.5. - 2D echo with EF of 60 to 65%,NWMA, grade 1 diastolic dysfunction. - Patient seen in consultation by cardiology and started on IV metoprolol  and heparin  infusion. - Patient followed by cardiology and noted to be in normal sinus rhythm and IV Lopressor  changed to Lopressor  12.5 mg p.o. twice daily. - Patient placed on Eliquis for anticoagulation which he tolerated. - Aspirin  discontinued per cardiology as patient started on Eliquis for anticoagulation. -Patient noted to be going in and out of A-fib during the hospitalization however remained rate controlled. -Outpatient follow-up with cardiology in A-fib clinic.   2.  Acute diastolic CHF exacerbation/acute HFrEF - Likely secondary to problem #1. Patient noted to have received IV Lasix  x 1 on presentation. -Patient with some symptomatic improvement. -2D echo with EF of 60 to 65%,NWMA, grade 1 diastolic dysfunction. - Patient started on Lopressor  12.5 mg p.o. twice daily per cardiology, and patient maintained  on Lipitor. -Patient improved clinically and was euvolemic by day of discharge. -Outpatient follow-up with cardiology.   3.  Acute COPD exacerbation -Patient noted per admitting physician to be diffusely wheezing on examination on initial presentation. -Patient was placed on IV steroids, scheduled  nebulizer treatments, Flonase, Claritin, PPI. -Patient improved clinically and will be discharged on a steroid taper, Dulera, Xopenex HFA. -Outpatient follow-up with PCP.   4.  Hypokalemia -Repleted during the hospitalization.   5.  Peripheral arterial disease - Maintained on Lipitor, Plavix . - Aspirin  discontinued per cardiology as patient started on Eliquis. -Outpatient follow-up.   6.  Chronic anemia - Remained stable during the hospitalization.   - Outpatient follow-up.    7.  Hypertension -Patient was on lisinopril  which has been discontinued by cardiology to make room for beta-blocker and diuresis. -Patient placed on ARB per cardiology recommendations on discharge in addition to beta-blocker. -Outpatient follow-up with PCP and cardiology.      Procedures: 2D echo 03/18/2024 Chest x-ray 03/17/2024  Consultations: Cardiology: Dr. Hayes Lipps 03/17/2024  Discharge Exam: Vitals:   03/19/24 1128 03/19/24 1320  BP: 139/72   Pulse: 72 86  Resp: 20   Temp: 97.7 F (36.5 C)   SpO2: 94% 96%    General: NAD Cardiovascular: Irregularly irregular.  No JVD, no murmurs rubs or gallops.  No lower extremity edema. Respiratory: Clear to auscultation bilaterally.  No wheezes, no crackles, no rhonchi.  Fair air movement.  Speaking in full sentences.  Discharge Instructions   Discharge Instructions     Diet - low sodium heart healthy   Complete by: As directed    Increase activity slowly   Complete by: As directed       Allergies as of 03/19/2024       Reactions   Hydrocodone  Other (See Comments)   hallucinations   Morphine  And Codeine Itching        Medication List     STOP taking these medications    amLODipine  10 MG tablet Commonly known as: NORVASC    aspirin  EC 81 MG tablet   lisinopril  40 MG tablet Commonly known as: ZESTRIL        TAKE these medications    acetaminophen  650 MG CR tablet Commonly known as: TYLENOL  Take 600-1,300 mg by mouth at  bedtime.   acetaminophen  500 MG tablet Commonly known as: TYLENOL  Take 1,000 mg by mouth every 6 (six) hours as needed for mild pain or headache.   apixaban 2.5 MG Tabs tablet Commonly known as: ELIQUIS Take 1 tablet (2.5 mg total) by mouth 2 (two) times daily.   atorvastatin 20 MG tablet Commonly known as: LIPITOR Take 20 mg by mouth in the morning.   bismuth subsalicylate 262 MG chewable tablet Commonly known as: PEPTO BISMOL Chew 262 mg by mouth at bedtime.   clopidogrel  75 MG tablet Commonly known as: Plavix  Take 1 tablet (75 mg total) by mouth daily. What changed: when to take this   COUGH SUPPRESSANT PO Take 1 tablet by mouth every 4 (four) hours as needed. [facility] Brand   Dulera 200-5 MCG/ACT Aero Generic drug: mometasone-formoterol Inhale 2 puffs into the lungs 2 (two) times daily.   feeding supplement Liqd Take 237 mLs by mouth 2 (two) times daily between meals. Start taking on: Mar 20, 2024   fluticasone 50 MCG/ACT nasal spray Commonly known as: FLONASE Place 2 sprays into both nostrils daily. Start taking on: Mar 20, 2024   irbesartan 150 MG tablet Commonly known as: Avapro Take  1 tablet (150 mg total) by mouth daily.   ketotifen 0.035 % ophthalmic solution Commonly known as: ZADITOR Place 1 drop into both eyes daily as needed (Dry eye).   levalbuterol 45 MCG/ACT inhaler Commonly known as: XOPENEX HFA Inhale 2 puffs into the lungs in the morning, at noon, and at bedtime for 4 days, THEN 2 puffs every 6 (six) hours as needed for wheezing. Start taking on: Mar 19, 2024   loratadine 10 MG tablet Commonly known as: CLARITIN Take 1 tablet (10 mg total) by mouth daily. Start taking on: Mar 20, 2024   metoprolol  tartrate 25 MG tablet Commonly known as: LOPRESSOR  Take 0.5 tablets (12.5 mg total) by mouth 2 (two) times daily.   multivitamin with minerals Tabs tablet Take 1 tablet by mouth daily. Start taking on: Mar 20, 2024   pantoprazole  40 MG  tablet Commonly known as: PROTONIX  Take 1 tablet (40 mg total) by mouth daily at 6 (six) AM. Start taking on: Mar 20, 2024   predniSONE 20 MG tablet Commonly known as: DELTASONE Take 3 tablets (60 mg total) by mouth daily before breakfast for 3 days, THEN 2 tablets (40 mg total) daily before breakfast for 3 days, THEN 1 tablet (20 mg total) daily before breakfast for 3 days. Start taking on: Mar 20, 2024   sertraline  50 MG tablet Commonly known as: ZOLOFT  Take 50 mg by mouth in the morning.   vitamin B-12 500 MCG tablet Commonly known as: CYANOCOBALAMIN Take 500 mcg by mouth in the morning.       Allergies  Allergen Reactions   Hydrocodone  Other (See Comments)    hallucinations   Morphine  And Codeine Itching    Follow-up Information     Wyn Heater, MD. Go on 04/01/2024.   Specialty: Family Medicine Why: @10 :Lewanda Rebel information: [address] [place] [address] [phone]         [facility] Follow up.   Specialty: Home Health Services Why: Lennart Quitter is new name- Agency will call you to set up apt times. Contact information: [address] [place] [address] [phone]         Nahser, Lela Purple, MD. Schedule an appointment as soon as possible for a visit in 3 week(s).   Specialty: Cardiology Why: Follow-up in 3 to 4 weeks. Contact information: [address] [place] [address] [phone]         [facility]. Schedule an appointment as soon as possible for a visit in 3 week(s).   Specialty: Cardiology Contact information: [address] [place]  [phone]                 The results of significant diagnostics from this hospitalization (including imaging, microbiology, ancillary and laboratory) are listed below for reference.    Significant Diagnostic Studies: ECHOCARDIOGRAM COMPLETE Result Date: 03/18/2024     ECHOCARDIOGRAM REPORT   Patient Name:   LEMAN BOLASH Date of Exam: 03/18/2024 Medical Rec #:  [id number]      Height:       64.0 in Accession #:    [id number]     Weight:       118.0 lb Date of Birth:  [birth year]     BSA:          1.563 m Patient Age:    82 years       BP:  146/74 mmHg Patient Gender: M              HR:           86 bpm. Exam Location:  Inpatient Procedure: 2D Echo, Cardiac Doppler and Color Doppler (Both Spectral and Color            Flow Doppler were utilized during procedure). Indications:    Atrial Fibrillation I48.91  History:        Patient has prior history of Echocardiogram examinations, most                 recent 06/03/2016. COPD; Risk Factors:Hypertension.  Sonographer:    Astrid Blamer Referring Phys: [phone] ARSHAD N KAKRAKANDY IMPRESSIONS  1. Left ventricular ejection fraction, by estimation, is 60 to 65%. The left ventricle has normal function. The left ventricle has no regional wall motion abnormalities. Left ventricular diastolic parameters are consistent with Grade I diastolic dysfunction (impaired relaxation).  2. Right ventricular systolic function is normal. The right ventricular size is normal. There is mildly elevated pulmonary artery systolic pressure.  3. The mitral valve is normal in structure. No evidence of mitral valve regurgitation. No evidence of mitral stenosis.  4. The aortic valve is calcified. There is moderate calcification of the aortic valve. There is moderate thickening of the aortic valve. Aortic valve regurgitation is not visualized. Mild aortic valve stenosis. Aortic valve mean gradient measures 9.5 mmHg. Aortic valve Vmax measures 2.00 m/s.  5. The inferior vena cava is normal in size with greater than 50% respiratory variability, suggesting right atrial pressure of 3 mmHg. FINDINGS  Left Ventricle: Left ventricular ejection fraction, by estimation, is 60 to 65%. The left ventricle has normal function. The left ventricle has no regional wall motion  abnormalities. The left ventricular internal cavity size was normal in size. There is  no left ventricular hypertrophy. Left ventricular diastolic parameters are consistent with Grade I diastolic dysfunction (impaired relaxation). Right Ventricle: The right ventricular size is normal. No increase in right ventricular wall thickness. Right ventricular systolic function is normal. There is mildly elevated pulmonary artery systolic pressure. The tricuspid regurgitant velocity is 2.94  m/s, and with an assumed right atrial pressure of 3 mmHg, the estimated right ventricular systolic pressure is 37.6 mmHg. Left Atrium: Left atrial size was normal in size. Right Atrium: Right atrial size was normal in size. Pericardium: There is no evidence of pericardial effusion. Mitral Valve: The mitral valve is normal in structure. No evidence of mitral valve regurgitation. No evidence of mitral valve stenosis. MV peak gradient, 8.5 mmHg. The mean mitral valve gradient is 6.0 mmHg. Tricuspid Valve: The tricuspid valve is normal in structure. Tricuspid valve regurgitation is trivial. No evidence of tricuspid stenosis. Aortic Valve: The aortic valve is calcified. There is moderate calcification of the aortic valve. There is moderate thickening of the aortic valve. Aortic valve regurgitation is not visualized. Mild aortic stenosis is present. Aortic valve mean gradient measures 9.5 mmHg. Aortic valve peak gradient measures 16.0 mmHg. Aortic valve area, by VTI measures 0.96 cm. Pulmonic Valve: The pulmonic valve was normal in structure. Pulmonic valve regurgitation is not visualized. No evidence of pulmonic stenosis. Aorta: The aortic root is normal in size and structure. Venous: The inferior vena cava is normal in size with greater than 50% respiratory variability, suggesting right atrial pressure of 3 mmHg. IAS/Shunts: No atrial level shunt detected by color flow Doppler.  LEFT VENTRICLE PLAX 2D LVIDd:  4.00 cm   Diastology  LVIDs:         2.10 cm   LV e' medial:    9.14 cm/s LV PW:         0.90 cm   LV E/e' medial:  11.6 LV IVS:        0.80 cm   LV e' lateral:   9.68 cm/s LVOT diam:     1.80 cm   LV E/e' lateral: 11.0 LV SV:         42 LV SV Index:   27 LVOT Area:     2.54 cm  RIGHT VENTRICLE RV S prime:     15.90 cm/s TAPSE (M-mode): 3.1 cm LEFT ATRIUM             Index        RIGHT ATRIUM           Index LA Vol (A2C):   26.9 ml 17.21 ml/m  RA Area:     14.20 cm LA Vol (A4C):   35.0 ml 22.39 ml/m  RA Volume:   28.90 ml  18.49 ml/m LA Biplane Vol: 32.2 ml 20.60 ml/m  AORTIC VALVE AV Area (Vmax):    1.37 cm AV Area (Vmean):   1.17 cm AV Area (VTI):     0.96 cm AV Vmax:           200.00 cm/s AV Vmean:          146.500 cm/s AV VTI:            0.442 m AV Peak Grad:      16.0 mmHg AV Mean Grad:      9.5 mmHg LVOT Vmax:         108.00 cm/s LVOT Vmean:        67.200 cm/s LVOT VTI:          0.167 m LVOT/AV VTI ratio: 0.38  AORTA Ao Root diam: 2.40 cm MITRAL VALVE                TRICUSPID VALVE MV Area (PHT): 3.76 cm     TR Peak grad:   34.6 mmHg MV Area VTI:   1.35 cm     TR Vmax:        294.00 cm/s MV Peak grad:  8.5 mmHg MV Mean grad:  6.0 mmHg     SHUNTS MV Vmax:       1.46 m/s     Systemic VTI:  0.17 m MV Vmean:      117.0 cm/s   Systemic Diam: 1.80 cm MV Decel Time: 202 msec MV E velocity: 106.00 cm/s MV A velocity: 123.00 cm/s MV E/A ratio:  0.86 Dorothye Gathers MD Electronically signed by Dorothye Gathers MD Signature Date/Time: 03/18/2024/4:18:25 PM    Final    DG Chest 2 View Result Date: 03/17/2024 CLINICAL DATA:  Shortness of breath for 1 week. EXAM: CHEST - 2 VIEW COMPARISON:  June 27, 2016 FINDINGS: The heart size and mediastinal contours are within normal limits. Marked severity calcification of the thoracic aorta is present. There is evidence of emphysematous lung disease with mild, diffuse, chronic appearing increased interstitial lung markings also noted. Mild lingular, mid right lung and bibasilar scarring and/or  atelectasis is present. There is no evidence of focal consolidation, pleural effusion or pneumothorax. The visualized skeletal structures are unremarkable. IMPRESSION: 1. Emphysematous lung disease with mild, diffuse, chronic appearing increased interstitial lung markings. 2. Mild lingular, mid right lung and bibasilar  scarring and/or atelectasis. Electronically Signed   By: Virgle Grime M.D.   On: 03/17/2024 16:24   VAS US  FEMORAL-FEMORAL BYPASS GRAFT Result Date: 02/20/2024 LOWER EXTREMITY ARTERIAL DUPLEX STUDY Patient Name:  MOMEN GROSSO  Date of Exam:   02/20/2024 Medical Rec #: [id number]       Accession #:    [id number] Date of Birth: 20-Dec-1940      Patient Gender: M Patient Age:   73 years Exam Location:  [facility] Procedure:      VAS US  LOWER EXTREMITY BYPASS GRAFT DUPLEX Referring Phys: Wyn Heater --------------------------------------------------------------------------------  Indications: Peripheral artery disease.  Vascular Interventions: 12/07/13: Stent right CIA.                         12/09/13: Bilateral CFA endarterectomy, right to left                         fem-fem bypass graft, and fem-BK popliteal bypass.                         07/03/23: DCBA distal right EIA. Current ABI:            R 1.07 L 0.73 Performing Technologist: Aloma Arrant RVS  Examination Guidelines: A complete evaluation includes B-mode imaging, spectral Doppler, color Doppler, and power Doppler as needed of all accessible portions of each vessel. Bilateral testing is considered an integral part of a complete examination. Limited examinations for reoccurring indications may be performed as noted.   Right Graft #1: right- left fem fem +------------------+--------+---------------+----------+--------+                   PSV cm/sStenosis       Waveform  Comments +------------------+--------+---------------+----------+--------+ Inflow            425     75-99% stenosismonophasic          +------------------+--------+---------------+----------+--------+ Prox Anastomosis  230     50-70% stenosismonophasic         +------------------+--------+---------------+----------+--------+ Proximal Graft    72                     monophasic         +------------------+--------+---------------+----------+--------+ Mid Graft         32                     monophasic         +------------------+--------+---------------+----------+--------+ Distal Graft      41                     monophasic         +------------------+--------+---------------+----------+--------+ Distal Anastomosis49                     monophasic         +------------------+--------+---------------+----------+--------+ Outflow           91                     monophasic         +------------------+--------+---------------+----------+--------+   Summary: Right: Patent right- left fem-fem bypass with 75-99% inflow stenosis and 50-70% stenosis in the proximal anastomosis.   See table(s) above for measurements and observations. Electronically signed by Irvin Mantel on 02/20/2024 at 4:52:13 PM.    Final    VAS US   ABI WITH/WO TBI Result Date: 02/20/2024  LOWER EXTREMITY DOPPLER STUDY Patient Name:  DENZIL PALKO  Date of Exam:   02/20/2024 Medical Rec #: [id number]       Accession #:    [id number] Date of Birth: 30-Jul-1941      Patient Gender: M Patient Age:   18 years Exam Location:  [facility] Procedure:      VAS US  ABI WITH/WO TBI Referring Phys: JOSHUA ROBINS --------------------------------------------------------------------------------  Indications: Peripheral artery disease.  Vascular Interventions: 12/07/13: Stent right CIA.                         12/09/13: Bilateral CFA endarterectomy, right to left                         fem-fem bypass graft, and fem-BK popliteal bypass.                         07/03/23: DCBA distal right EIA. Comparison Study: 11/21/23 Performing Technologist: Aloma Arrant RVS  Examination Guidelines: A complete evaluation includes at minimum, Doppler waveform signals and systolic blood pressure reading at the level of bilateral brachial, anterior tibial, and posterior tibial arteries, when vessel segments are accessible. Bilateral testing is considered an integral part of a complete examination. Photoelectric Plethysmograph (PPG) waveforms and toe systolic pressure readings are included as required and additional duplex testing as needed. Limited examinations for reoccurring indications may be performed as noted.  ABI Findings: +---------+------------------+-----+----------+--------+ Right    Rt Pressure (mmHg)IndexWaveform  Comment  +---------+------------------+-----+----------+--------+ Brachial 160                                       +---------+------------------+-----+----------+--------+ PTA      171               1.07 monophasic         +---------+------------------+-----+----------+--------+ DP       90                0.56 monophasic         +---------+------------------+-----+----------+--------+ Great Toe70                0.44                    +---------+------------------+-----+----------+--------+ +---------+------------------+-----+--------+-------+ Left     Lt Pressure (mmHg)IndexWaveformComment +---------+------------------+-----+--------+-------+ Brachial 159                                    +---------+------------------+-----+--------+-------+ PTA      111               0.69 biphasic        +---------+------------------+-----+--------+-------+ DP       116               0.72 biphasic        +---------+------------------+-----+--------+-------+ Great Toe103               0.64                 +---------+------------------+-----+--------+-------+ +-------+-----------+-----------+------------+------------+ ABI/TBIToday's ABIToday's TBIPrevious ABIPrevious TBI  +-------+-----------+-----------+------------+------------+ Right  1.07       0.44       1.08  0.58         +-------+-----------+-----------+------------+------------+ Left   0.73       0.64       0.80        0.66         +-------+-----------+-----------+------------+------------+ Arterial wall calcification precludes accurate ankle pressures and ABIs. Right ABIs appear essentially unchanged compared to prior study on 11/21/23. Left ABIs appear decreased compared to prior study on 11/21/23.  Summary: Right: Resting right ankle-brachial index is within normal range. The right toe-brachial index is abnormal. Left: Resting left ankle-brachial index indicates moderate left lower extremity arterial disease. The left toe-brachial index is abnormal. *See table(s) above for measurements and observations.  Electronically signed by Irvin Mantel on 02/20/2024 at 4:49:29 PM.    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                         12/09/13: Bilateral CFA endarterectomy, right to left                         fem-fem bypass graft, and fem-BK popliteal bypass.                         07/03/23: DCBA distal right EIA. Limitations: Air/bowel gas and patient discomfort.  Performing Technologist: Parke Boll RVS, RCS  Examination Guidelines: A complete evaluation includes B-mode imaging, spectral Doppler, color Doppler, and power Doppler as needed of all accessible portions of  each vessel. Bilateral testing is considered an integral part of a complete examination. Limited examinations for reoccurring indications may be performed as noted.  Abdominal Aorta Findings: +-------------+-------+----------+----------+--------+--------+--------+ Location     AP (cm)Trans (cm)PSV (cm/s)WaveformThrombusComments +-------------+-------+----------+----------+--------+--------+--------+ RT EIA Prox                   295                                +-------------+-------+----------+----------+--------+--------+--------+ RT EIA Mid                    294                                +-------------+-------+----------+----------+--------+--------+--------+ RT EIA Distal                 316                                +-------------+-------+----------+----------+--------+--------+--------+  Summary: Stenosis: +--------------------+-------------+ Location            Stenosis      +--------------------+-------------+ Right External Iliac>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  Electronically signed by Angela Kell MD on 02/19/2024  at 5:12:25 PM.    Final     Microbiology: Recent Results (from the past 240 hours)  Resp panel by RT-PCR (RSV, Flu A&B, Covid) Anterior Nasal Swab     Status: None   Collection Time: 03/17/24  5:22 PM   Specimen: Anterior Nasal Swab  Result Value Ref Range Status   SARS Coronavirus 2 by RT PCR NEGATIVE NEGATIVE Final   Influenza A by PCR NEGATIVE NEGATIVE Final   Influenza B by PCR NEGATIVE NEGATIVE Final    Comment: (NOTE) The Xpert Xpress SARS-CoV-2/FLU/RSV plus assay is intended as an aid in the diagnosis of influenza from Nasopharyngeal swab specimens and should not be used as a sole basis for treatment. Nasal washings and aspirates are unacceptable for Xpert Xpress SARS-CoV-2/FLU/RSV testing.  Fact Sheet for  Patients: BloggerCourse.com  Fact Sheet for Healthcare Providers: SeriousBroker.it  This test is not yet approved or cleared by the [place]  FDA and has been authorized for detection and/or diagnosis of SARS-CoV-2 by FDA under an Emergency Use Authorization (EUA). This EUA will remain in effect (meaning this test can be used) for the duration of the COVID-19 declaration under Section 564(b)(1) of the Act, 21 U.S.C. section 360bbb-3(b)(1), unless the authorization is terminated or revoked.     Resp Syncytial Virus by PCR NEGATIVE NEGATIVE Final    Comment: (NOTE) Fact Sheet for Patients: BloggerCourse.com  Fact Sheet for Healthcare Providers: SeriousBroker.it  This test is not yet approved or cleared by the [place]  FDA and has been authorized for detection and/or diagnosis of SARS-CoV-2 by FDA under an Emergency Use Authorization (EUA). This EUA will remain in effect (meaning this test can be used) for the duration of the COVID-19 declaration under Section 564(b)(1) of the Act, 21 U.S.C. section 360bbb-3(b)(1), unless the authorization is terminated or revoked.  Performed at [facility], [address] [place], [place] [address]      Labs: Basic Metabolic Panel: Recent Labs  Lab 03/17/24 1525 03/17/24 1722 03/18/24 0418 03/19/24 0823  NA 138  --  139 136  K 3.1*  --  4.1 4.2  CL 98  --  104 97*  CO2 27  --  24 24  GLUCOSE 127*  --  142* 122*  BUN 7*  --  7* 19  CREATININE 0.76  --  0.83 1.08  CALCIUM 8.8*  --  8.8* 9.1  MG  --  1.7 2.6* 2.2   Liver Function Tests: Recent Labs  Lab 03/18/24 0418  AST 25  ALT 15  ALKPHOS 101  BILITOT 0.8  PROT 6.5  ALBUMIN 3.6   No results for input(s): "LIPASE", "AMYLASE" in the last 168 hours. No results for input(s): "AMMONIA" in the last 168 hours. CBC: Recent Labs  Lab 03/17/24 1525 03/18/24 0418  03/19/24 0823  WBC 7.7 10.2 13.9*  HGB 10.9* 11.3* 11.5*  HCT 33.2* 34.0* 34.4*  MCV 91.7 91.9 91.7  PLT 229 233 326   Cardiac Enzymes: No results for input(s): "CKTOTAL", "CKMB", "CKMBINDEX", "TROPONINI" in the last 168 hours. BNP: BNP (last 3 results) Recent Labs    03/17/24 1525  BNP 344.5*    ProBNP (last 3 results) No results for input(s): "PROBNP" in the last 8760 hours.  CBG: No results for input(s): "GLUCAP" in the last 168 hours.     Signed:  Hilda Lovings MD.  [facility] 03/19/2024, 3:40 PM

## 2024-03-19 NOTE — Discharge Instructions (Addendum)

## 2024-03-19 NOTE — TOC CM/SW Note (Signed)
 Transition of Care [facility]) - Inpatient Brief Assessment   Patient Details  Name: Thomas Ferrell MRN: [id number] Date of Birth: 1941/02/23  Transition of Care [facility]) CM/SW Contact:    Jennett Model, RN Phone Number: 03/19/2024, 1:32 PM   Clinical Narrative: From home with spouse, has PCP and insurance on file, states has no HH services in place at this time , has transport chair, walker x 2, grab bars and shower chair at home.   Per pt eval rec HHHPT,  and HHOT and HHRN ordered.  NCM offered choice, wife chose [facility] since she already has HH with them.  NCM made referral to Amy with [facility], she is able to take referral and soc wlll begin 24 to 48 hrs post dc.  States family member will transport them home at [employer] and family is support system, states gets medications from [facility] on [address].  Pta uses transport chair , is trying to get back to using walker.  Patient gives this NCM permission to speak with wife.    Transition of Care Asessment: Insurance and Status: Insurance coverage has been reviewed Patient has primary care physician: Yes Home environment has been reviewed: home with wife Prior level of function:: transport chair Prior/Current Home Services: Current home services (transport chair, walker x 2, grab bars, shower chair) Social Drivers of Health Review: SDOH reviewed no interventions necessary Readmission risk has been reviewed: Yes Transition of care needs: transition of care needs identified, TOC will continue to follow

## 2024-03-19 NOTE — TOC Transition Note (Signed)
 Transition of Care [facility]) - Discharge Note   Patient Details  Name: Thomas Ferrell MRN: [id number] Date of Birth: 05/31/1941  Transition of Care [facility]) CM/SW Contact:  Jennett Model, RN Phone Number: 03/19/2024, 1:35 PM   Clinical Narrative:    For dc today, he is set up with [facility] for HHRN, HHPT, HHOT.  Wife will transport him home.         Patient Goals and CMS Choice            Discharge Placement                       Discharge Plan and Services Additional resources added to the After Visit Summary for                                       Social Drivers of Health (SDOH) Interventions SDOH Screenings   Food Insecurity: No Food Insecurity (03/18/2024)  Housing: Unknown (03/18/2024)  Transportation Needs: No Transportation Needs (03/18/2024)  Utilities: Not At Risk (03/18/2024)  Social Connections: Moderately Isolated (03/18/2024)  Tobacco Use: Medium Risk (03/17/2024)     Readmission Risk Interventions    03/19/2024    1:30 PM  Readmission Risk Prevention Plan  Post Dischage Appt Complete  Medication Screening Complete  Transportation Screening Complete

## 2024-03-19 NOTE — Progress Notes (Signed)
 Initial Nutrition Assessment  DOCUMENTATION CODES:   Not applicable  INTERVENTION:   - Ensure Enlive po BID, each supplement provides 350 kcal and 20 grams of protein  - MVI with minerals daily  - Encourage PO intake  NUTRITION DIAGNOSIS:   Increased nutrient needs related to chronic illness (COPD, CHF) as evidenced by estimated needs.  GOAL:   Patient will meet greater than or equal to 90% of their needs  MONITOR:   PO intake, Supplement acceptance, Weight trends  REASON FOR ASSESSMENT:   Consult COPD Protocol  ASSESSMENT:   83 year old male who presented to the ED on 03/17/24 with new onset atrial fibrillation. PMH of PAD (previous bilateral common femoral endarterectomies, right to left femoral-femoral bypass and left femoral to above knee popliteal bypass with Propaten on 12/09/13 for ischemic left leg), CAD (history of right CEA on 10/21/06 for symptomatic carotid stenosis), previous CVA/TIA, HTN, HLD, COPD, bladder cancer, previous spinal injury, anemia, prior tobacco use. Pt admitted with new onset atrial fibrillation with occasional RVR, new onset CHF.  Spoke with pt and wife in hallway. Pt sitting in chair with walker and practicing standing up to build strength. Noted pt typically uses a transport chair and has not walked for many months due to pain.  Pt reports that his appetite is okay and fairly standard for his age. Pt typically eats 3 meals daily. Breakfast includes cheerios and coffee. Lunch is usually a sandwich. Dinner might be meatloaf or chicken pot pie.  Pt denies any recent weight loss. Wife reports pt's UBW as 125 lbs. Weight stable in chart.  Discussed increased nutrient needs related to CHF and COPD. Pt willing to try drink Ensure supplements to aid in meeting increased kcal and protein needs. Will also order daily MVI with minerals.  Meal Completion: 50-100%  Medications reviewed and include: vitamin B12 500 mcg daily, IV solu-medrol ,  protonix   Labs reviewed: magnesium  2.6  UOP: 2025 mL x 24 hours I/O's: -1.6 L since admit  NUTRITION - FOCUSED PHYSICAL EXAM:  Deferred as pt was out sitting in the hallway.  Diet Order:   Diet Order             Diet - low sodium heart healthy           Diet Heart Room service appropriate? Yes; Fluid consistency: Thin  Diet effective now                   EDUCATION NEEDS:   Education needs have been addressed  Skin:  Skin Assessment: Reviewed RN Assessment  Last BM:  03/18/24  Height:   Ht Readings from Last 1 Encounters:  03/18/24 5\' 4"  (1.626 m)    Weight:   Wt Readings from Last 1 Encounters:  03/19/24 53.9 kg    BMI:  Body mass index is 20.4 kg/m.  Estimated Nutritional Needs:   Kcal:  1700-1900  Protein:  75-90 grams  Fluid:  1.7-1.9 L    Thomas Headland, MS, RD, LDN Registered Dietitian II Please see AMiON for contact information.

## 2024-03-19 NOTE — Progress Notes (Signed)
 Rounding Note    Patient Name: Thomas Ferrell Date of Encounter: 03/19/2024  [facility] Cardiologist: Ahmad Alert, MD   Subjective   He is in SR. He does not ambulate due to back/disc issues causing pain in his left leg. Still dyspneic when working with PT.  Was found to have new dx of atrial fib  He was on ASA and plavix  for PVD We have added eliquis and stopped ASA     Inpatient Medications    Scheduled Meds:  apixaban  2.5 mg Oral BID   atorvastatin  20 mg Oral Daily   clopidogrel   75 mg Oral Daily   cyanocobalamin  500 mcg Oral Daily   fluticasone  2 spray Each Nare Daily   levalbuterol  0.63 mg Nebulization TID   loratadine  10 mg Oral Daily   methylPREDNISolone  (SOLU-MEDROL ) injection  80 mg Intravenous Daily   metoprolol  tartrate  12.5 mg Oral BID   pantoprazole   40 mg Oral Q0600   sertraline   50 mg Oral Daily   Continuous Infusions:   PRN Meds: hydrOXYzine, ipratropium   Vital Signs    Vitals:   03/19/24 0811 03/19/24 0832 03/19/24 0933 03/19/24 1128  BP: (!) 141/90  (!) 141/90 139/72  Pulse: (!) 110 98 98 72  Resp: 20 19  20   Temp: 98 F (36.7 C)   97.7 F (36.5 C)  TempSrc: Oral   Oral  SpO2: 94% 96%  94%  Weight:      Height:        Intake/Output Summary (Last 24 hours) at 03/19/2024 1243 Last data filed at 03/19/2024 4098 Gross per 24 hour  Intake 360 ml  Output 1525 ml  Net -1165 ml      03/19/2024    5:41 AM 03/18/2024    2:29 PM 03/17/2024    3:16 PM  Last 3 Weights  Weight (lbs) 118 lb 13.3 oz 123 lb 0.3 oz 118 lb  Weight (kg) 53.9 kg 55.8 kg 53.524 kg      Telemetry    Sinus rhythm in the 80s - Personally Reviewed  ECG    No new tracings - Personally Reviewed  Physical Exam    Physical Exam: Blood pressure 139/72, pulse 72, temperature 97.7 F (36.5 C), temperature source Oral, resp. rate 20, height 5\' 4"  (1.626 m), weight 53.9 kg, SpO2 94%.       GEN:  Well nourished, well developed in no acute  distress HEENT: Normal NECK: No JVD; No carotid bruits LYMPHATICS: No lymphadenopathy CARDIAC: irreg. Irreg.  + systolic   RESPIRATORY:  Clear to auscultation without rales, wheezing or rhonchi  ABDOMEN: Soft, non-tender, non-distended MUSCULOSKELETAL:  No edema; No deformity  SKIN: Warm and dry NEUROLOGIC:  Alert and oriented x 3     Labs    High Sensitivity Troponin:   Recent Labs  Lab 03/17/24 1525 03/17/24 1722  TROPONINIHS 6 6     Chemistry Recent Labs  Lab 03/17/24 1525 03/17/24 1722 03/18/24 0418 03/19/24 0823  NA 138  --  139 136  K 3.1*  --  4.1 4.2  CL 98  --  104 97*  CO2 27  --  24 24  GLUCOSE 127*  --  142* 122*  BUN 7*  --  7* 19  CREATININE 0.76  --  0.83 1.08  CALCIUM 8.8*  --  8.8* 9.1  MG  --  1.7 2.6* 2.2  PROT  --   --  6.5  --   ALBUMIN  --   --  3.6  --   AST  --   --  25  --   ALT  --   --  15  --   ALKPHOS  --   --  101  --   BILITOT  --   --  0.8  --   GFRNONAA >60  --  >60 >60  ANIONGAP 13  --  11 15    Lipids No results for input(s): "CHOL", "TRIG", "HDL", "LABVLDL", "LDLCALC", "CHOLHDL" in the last 168 hours.  Hematology Recent Labs  Lab 03/17/24 1525 03/18/24 0418 03/19/24 0823  WBC 7.7 10.2 13.9*  RBC 3.62* 3.70* 3.75*  HGB 10.9* 11.3* 11.5*  HCT 33.2* 34.0* 34.4*  MCV 91.7 91.9 91.7  MCH 30.1 30.5 30.7  MCHC 32.8 33.2 33.4  RDW 13.3 13.5 13.7  PLT 229 233 326   Thyroid   Recent Labs  Lab 03/17/24 1722  TSH 1.741    BNP Recent Labs  Lab 03/17/24 1525  BNP 344.5*    DDimer No results for input(s): "DDIMER" in the last 168 hours.   Radiology    ECHOCARDIOGRAM COMPLETE Result Date: 03/18/2024    ECHOCARDIOGRAM REPORT   Patient Name:   DAMARE URY Date of Exam: 03/18/2024 Medical Rec #:  [id number]      Height:       64.0 in Accession #:    [id number]     Weight:       118.0 lb Date of Birth:  Oct 09, 1941     BSA:          1.563 m Patient Age:    82 years       BP:           146/74 mmHg Patient Gender: M               HR:           86 bpm. Exam Location:  Inpatient Procedure: 2D Echo, Cardiac Doppler and Color Doppler (Both Spectral and Color            Flow Doppler were utilized during procedure). Indications:    Atrial Fibrillation I48.91  History:        Patient has prior history of Echocardiogram examinations, most                 recent 06/03/2016. COPD; Risk Factors:Hypertension.  Sonographer:    Astrid Blamer Referring Phys: [phone] ARSHAD N KAKRAKANDY IMPRESSIONS  1. Left ventricular ejection fraction, by estimation, is 60 to 65%. The left ventricle has normal function. The left ventricle has no regional wall motion abnormalities. Left ventricular diastolic parameters are consistent with Grade I diastolic dysfunction (impaired relaxation).  2. Right ventricular systolic function is normal. The right ventricular size is normal. There is mildly elevated pulmonary artery systolic pressure.  3. The mitral valve is normal in structure. No evidence of mitral valve regurgitation. No evidence of mitral stenosis.  4. The aortic valve is calcified. There is moderate calcification of the aortic valve. There is moderate thickening of the aortic valve. Aortic valve regurgitation is not visualized. Mild aortic valve stenosis. Aortic valve mean gradient measures 9.5 mmHg. Aortic valve Vmax measures 2.00 m/s.  5. The inferior vena cava is normal in size with greater than 50% respiratory variability, suggesting right atrial pressure of 3 mmHg. FINDINGS  Left Ventricle: Left ventricular ejection fraction, by estimation, is 60 to 65%. The left ventricle  has normal function. The left ventricle has no regional wall motion abnormalities. The left ventricular internal cavity size was normal in size. There is  no left ventricular hypertrophy. Left ventricular diastolic parameters are consistent with Grade I diastolic dysfunction (impaired relaxation). Right Ventricle: The right ventricular size is normal. No increase in right ventricular wall  thickness. Right ventricular systolic function is normal. There is mildly elevated pulmonary artery systolic pressure. The tricuspid regurgitant velocity is 2.94  m/s, and with an assumed right atrial pressure of 3 mmHg, the estimated right ventricular systolic pressure is 37.6 mmHg. Left Atrium: Left atrial size was normal in size. Right Atrium: Right atrial size was normal in size. Pericardium: There is no evidence of pericardial effusion. Mitral Valve: The mitral valve is normal in structure. No evidence of mitral valve regurgitation. No evidence of mitral valve stenosis. MV peak gradient, 8.5 mmHg. The mean mitral valve gradient is 6.0 mmHg. Tricuspid Valve: The tricuspid valve is normal in structure. Tricuspid valve regurgitation is trivial. No evidence of tricuspid stenosis. Aortic Valve: The aortic valve is calcified. There is moderate calcification of the aortic valve. There is moderate thickening of the aortic valve. Aortic valve regurgitation is not visualized. Mild aortic stenosis is present. Aortic valve mean gradient measures 9.5 mmHg. Aortic valve peak gradient measures 16.0 mmHg. Aortic valve area, by VTI measures 0.96 cm. Pulmonic Valve: The pulmonic valve was normal in structure. Pulmonic valve regurgitation is not visualized. No evidence of pulmonic stenosis. Aorta: The aortic root is normal in size and structure. Venous: The inferior vena cava is normal in size with greater than 50% respiratory variability, suggesting right atrial pressure of 3 mmHg. IAS/Shunts: No atrial level shunt detected by color flow Doppler.  LEFT VENTRICLE PLAX 2D LVIDd:         4.00 cm   Diastology LVIDs:         2.10 cm   LV e' medial:    9.14 cm/s LV PW:         0.90 cm   LV E/e' medial:  11.6 LV IVS:        0.80 cm   LV e' lateral:   9.68 cm/s LVOT diam:     1.80 cm   LV E/e' lateral: 11.0 LV SV:         42 LV SV Index:   27 LVOT Area:     2.54 cm  RIGHT VENTRICLE RV S prime:     15.90 cm/s TAPSE (M-mode): 3.1 cm  LEFT ATRIUM             Index        RIGHT ATRIUM           Index LA Vol (A2C):   26.9 ml 17.21 ml/m  RA Area:     14.20 cm LA Vol (A4C):   35.0 ml 22.39 ml/m  RA Volume:   28.90 ml  18.49 ml/m LA Biplane Vol: 32.2 ml 20.60 ml/m  AORTIC VALVE AV Area (Vmax):    1.37 cm AV Area (Vmean):   1.17 cm AV Area (VTI):     0.96 cm AV Vmax:           200.00 cm/s AV Vmean:          146.500 cm/s AV VTI:            0.442 m AV Peak Grad:      16.0 mmHg AV Mean Grad:      9.5 mmHg LVOT  Vmax:         108.00 cm/s LVOT Vmean:        67.200 cm/s LVOT VTI:          0.167 m LVOT/AV VTI ratio: 0.38  AORTA Ao Root diam: 2.40 cm MITRAL VALVE                TRICUSPID VALVE MV Area (PHT): 3.76 cm     TR Peak grad:   34.6 mmHg MV Area VTI:   1.35 cm     TR Vmax:        294.00 cm/s MV Peak grad:  8.5 mmHg MV Mean grad:  6.0 mmHg     SHUNTS MV Vmax:       1.46 m/s     Systemic VTI:  0.17 m MV Vmean:      117.0 cm/s   Systemic Diam: 1.80 cm MV Decel Time: 202 msec MV E velocity: 106.00 cm/s MV A velocity: 123.00 cm/s MV E/A ratio:  0.86 Dorothye Gathers MD Electronically signed by Dorothye Gathers MD Signature Date/Time: 03/18/2024/4:18:25 PM    Final    DG Chest 2 View Result Date: 03/17/2024 CLINICAL DATA:  Shortness of breath for 1 week. EXAM: CHEST - 2 VIEW COMPARISON:  June 27, 2016 FINDINGS: The heart size and mediastinal contours are within normal limits. Marked severity calcification of the thoracic aorta is present. There is evidence of emphysematous lung disease with mild, diffuse, chronic appearing increased interstitial lung markings also noted. Mild lingular, mid right lung and bibasilar scarring and/or atelectasis is present. There is no evidence of focal consolidation, pleural effusion or pneumothorax. The visualized skeletal structures are unremarkable. IMPRESSION: 1. Emphysematous lung disease with mild, diffuse, chronic appearing increased interstitial lung markings. 2. Mild lingular, mid right lung and bibasilar scarring  and/or atelectasis. Electronically Signed   By: Virgle Grime M.D.   On: 03/17/2024 16:24    Cardiac Studies   Echo pending  Patient Profile     83 y.o. male with a hx of PAD (previous bilateral common femoral endarterectomies, right to left femoral-femoral bypass and left femoral to above knee popliteal bypass with Propaten on 12/09/13 by Dr. Nolene Baumgarten for ischemic left leg), Carotid artery disease (history of right CEA on 10/21/06 by Dr. Nolene Baumgarten for symptomatic carotid stenosis), previous CVA/TIA, HTN, HLD, COPD, bladder cancer, previous spinal injury who is being seen for the evaluation of new atrial fibrillation.  Assessment & Plan    Afib with RVR - newly recognized this admission  He is back in Afib  HR is well controlled. On eliquis Metoprolol  12. 5 bid   Echo : Normal LV systolic function with EF 60-65%. , grade I DD  Mild pulmonary HTN Mild AS     2.  PAD :  cont plavix ,  DC asa since he is now on eliquis   3.  HTN  :  was on amlodipine  10 mg and LIsinopril  40 mg at home  Creatinine is 1.08 DC on an ARB ( either Irbesartan 150 or Valsartan 160 mg a day )       Acute congestive heart failure - BNP 344 - scheduled to receive 40 mg IV lasix  x 1 dose - suspect secondary to Afib  preserved EF in 2017 - agree with diuresis   PAD - last intervention 06/2023 Carotid artery disease - on ASA and plavix  - will likely need to discontinue one antiplatelet agent for [facility] - s/p drug coated balloon angioplasty to distal  external iliac artery 06/2023, hx of fem-fem bypass, fem-pop bypass - need to reach to [facility] for guidance on triple therapy vs discontinuation of ASA to allow for [facility] with eliquis for stroke PPX       Ahmad Alert, MD  03/19/2024 1:20 PM    [facility] [address],  [address] [place], [place]  [address] Phone: [phone]; Fax: [phone]

## 2024-03-24 ENCOUNTER — Other Ambulatory Visit (HOSPITAL_COMMUNITY): Payer: Self-pay

## 2024-03-27 DIAGNOSIS — E7800 Pure hypercholesterolemia, unspecified: Secondary | ICD-10-CM | POA: Diagnosis not present

## 2024-03-27 DIAGNOSIS — Z8673 Personal history of transient ischemic attack (TIA), and cerebral infarction without residual deficits: Secondary | ICD-10-CM | POA: Diagnosis not present

## 2024-03-27 DIAGNOSIS — J449 Chronic obstructive pulmonary disease, unspecified: Secondary | ICD-10-CM | POA: Diagnosis not present

## 2024-03-27 DIAGNOSIS — M533 Sacrococcygeal disorders, not elsewhere classified: Secondary | ICD-10-CM | POA: Diagnosis not present

## 2024-03-27 DIAGNOSIS — Z556 Problems related to health literacy: Secondary | ICD-10-CM | POA: Diagnosis not present

## 2024-03-27 DIAGNOSIS — G8929 Other chronic pain: Secondary | ICD-10-CM | POA: Diagnosis not present

## 2024-03-27 DIAGNOSIS — Z7902 Long term (current) use of antithrombotics/antiplatelets: Secondary | ICD-10-CM | POA: Diagnosis not present

## 2024-03-27 DIAGNOSIS — Z87891 Personal history of nicotine dependence: Secondary | ICD-10-CM | POA: Diagnosis not present

## 2024-03-27 DIAGNOSIS — M4726 Other spondylosis with radiculopathy, lumbar region: Secondary | ICD-10-CM | POA: Diagnosis not present

## 2024-03-27 DIAGNOSIS — Z7982 Long term (current) use of aspirin: Secondary | ICD-10-CM | POA: Diagnosis not present

## 2024-03-27 DIAGNOSIS — Z8551 Personal history of malignant neoplasm of bladder: Secondary | ICD-10-CM | POA: Diagnosis not present

## 2024-03-27 DIAGNOSIS — M48062 Spinal stenosis, lumbar region with neurogenic claudication: Secondary | ICD-10-CM | POA: Diagnosis not present

## 2024-03-27 DIAGNOSIS — M19031 Primary osteoarthritis, right wrist: Secondary | ICD-10-CM | POA: Diagnosis not present

## 2024-03-27 DIAGNOSIS — M25511 Pain in right shoulder: Secondary | ICD-10-CM | POA: Diagnosis not present

## 2024-03-27 DIAGNOSIS — Z7901 Long term (current) use of anticoagulants: Secondary | ICD-10-CM | POA: Diagnosis not present

## 2024-03-27 DIAGNOSIS — Z981 Arthrodesis status: Secondary | ICD-10-CM | POA: Diagnosis not present

## 2024-03-30 ENCOUNTER — Telehealth: Payer: Self-pay | Admitting: Cardiovascular Disease

## 2024-03-30 NOTE — Telephone Encounter (Signed)
 Pt c/o medication issue:  1. Name of Medication:   apixaban  (ELIQUIS ) 2.5 MG TABS tablet    clopidogrel  (PLAVIX ) 75 MG tablet    2. How are you currently taking this medication (dosage and times per day)? As written   3. Are you having a reaction (difficulty breathing--STAT)? No   4. What is your medication issue? Pt spouse called in stating Dr. Enrique Harvest, PCP, wanted to ask if pt should be on both these medications. Please advise.

## 2024-03-30 NOTE — Telephone Encounter (Signed)
 Spoke with pt's wife and explained Nathanel Bal, PA-C recommendations for contacting vascular surgeon's office regarding continuing Plavix  but needing to continue Eliquis  for the new onset afib. Pt's wife verbalized understanding of plan and had no further questions.

## 2024-03-31 ENCOUNTER — Other Ambulatory Visit (HOSPITAL_COMMUNITY): Payer: Self-pay

## 2024-04-01 DIAGNOSIS — E7800 Pure hypercholesterolemia, unspecified: Secondary | ICD-10-CM | POA: Diagnosis not present

## 2024-04-01 DIAGNOSIS — Z23 Encounter for immunization: Secondary | ICD-10-CM | POA: Diagnosis not present

## 2024-04-01 DIAGNOSIS — Z7901 Long term (current) use of anticoagulants: Secondary | ICD-10-CM | POA: Diagnosis not present

## 2024-04-01 DIAGNOSIS — M25511 Pain in right shoulder: Secondary | ICD-10-CM | POA: Diagnosis not present

## 2024-04-01 DIAGNOSIS — R262 Difficulty in walking, not elsewhere classified: Secondary | ICD-10-CM | POA: Diagnosis not present

## 2024-04-01 DIAGNOSIS — Z7902 Long term (current) use of antithrombotics/antiplatelets: Secondary | ICD-10-CM | POA: Diagnosis not present

## 2024-04-01 DIAGNOSIS — I4891 Unspecified atrial fibrillation: Secondary | ICD-10-CM | POA: Diagnosis not present

## 2024-04-01 DIAGNOSIS — J449 Chronic obstructive pulmonary disease, unspecified: Secondary | ICD-10-CM | POA: Diagnosis not present

## 2024-04-01 DIAGNOSIS — Z556 Problems related to health literacy: Secondary | ICD-10-CM | POA: Diagnosis not present

## 2024-04-01 DIAGNOSIS — Z8673 Personal history of transient ischemic attack (TIA), and cerebral infarction without residual deficits: Secondary | ICD-10-CM | POA: Diagnosis not present

## 2024-04-01 DIAGNOSIS — D649 Anemia, unspecified: Secondary | ICD-10-CM | POA: Diagnosis not present

## 2024-04-01 DIAGNOSIS — M533 Sacrococcygeal disorders, not elsewhere classified: Secondary | ICD-10-CM | POA: Diagnosis not present

## 2024-04-01 DIAGNOSIS — E876 Hypokalemia: Secondary | ICD-10-CM | POA: Diagnosis not present

## 2024-04-01 DIAGNOSIS — I10 Essential (primary) hypertension: Secondary | ICD-10-CM | POA: Diagnosis not present

## 2024-04-01 DIAGNOSIS — G8929 Other chronic pain: Secondary | ICD-10-CM | POA: Diagnosis not present

## 2024-04-01 DIAGNOSIS — Z87891 Personal history of nicotine dependence: Secondary | ICD-10-CM | POA: Diagnosis not present

## 2024-04-01 DIAGNOSIS — M48062 Spinal stenosis, lumbar region with neurogenic claudication: Secondary | ICD-10-CM | POA: Diagnosis not present

## 2024-04-01 DIAGNOSIS — I5031 Acute diastolic (congestive) heart failure: Secondary | ICD-10-CM | POA: Diagnosis not present

## 2024-04-01 DIAGNOSIS — Z981 Arthrodesis status: Secondary | ICD-10-CM | POA: Diagnosis not present

## 2024-04-01 DIAGNOSIS — Z8551 Personal history of malignant neoplasm of bladder: Secondary | ICD-10-CM | POA: Diagnosis not present

## 2024-04-01 DIAGNOSIS — M19031 Primary osteoarthritis, right wrist: Secondary | ICD-10-CM | POA: Diagnosis not present

## 2024-04-01 DIAGNOSIS — Z0000 Encounter for general adult medical examination without abnormal findings: Secondary | ICD-10-CM | POA: Diagnosis not present

## 2024-04-01 DIAGNOSIS — Z7982 Long term (current) use of aspirin: Secondary | ICD-10-CM | POA: Diagnosis not present

## 2024-04-01 DIAGNOSIS — M4726 Other spondylosis with radiculopathy, lumbar region: Secondary | ICD-10-CM | POA: Diagnosis not present

## 2024-04-02 ENCOUNTER — Telehealth: Payer: Self-pay

## 2024-04-02 NOTE — Telephone Encounter (Signed)
 Spoke with patient wife and confirm with Caesar Caster that they need to follow Dr. Christia Cowboy recommendations. Stop Plavix  and continue ASA and Eliquis .

## 2024-04-02 NOTE — Telephone Encounter (Signed)
 Per Dr. Rosalva Comber, Pt needs to be on ASA and eliquis  please. Can stop plavix .  Called Pt to relay this information and sent message to Minnie Amber, PA.  Pt's wife expressed that she was told pt can't take aspirin .  She agreed to call the cardiologist to discuss medication management.

## 2024-04-07 ENCOUNTER — Ambulatory Visit (HOSPITAL_COMMUNITY)
Admission: RE | Admit: 2024-04-07 | Discharge: 2024-04-07 | Disposition: A | Discharge status: Home / Self Care | Source: Ambulatory Visit | Attending: Internal Medicine | Admitting: Internal Medicine

## 2024-04-07 VITALS — BP 200/86 | HR 86 | Ht 64.0 in | Wt 125.0 lb

## 2024-04-07 DIAGNOSIS — I480 Paroxysmal atrial fibrillation: Secondary | ICD-10-CM | POA: Diagnosis not present

## 2024-04-07 DIAGNOSIS — D6869 Other thrombophilia: Secondary | ICD-10-CM

## 2024-04-07 DIAGNOSIS — I10 Essential (primary) hypertension: Secondary | ICD-10-CM

## 2024-04-07 DIAGNOSIS — I4891 Unspecified atrial fibrillation: Secondary | ICD-10-CM

## 2024-04-07 MED ORDER — APIXABAN 2.5 MG PO TABS: 2.5000 mg | ORAL_TABLET | Freq: Two times a day (BID) | ORAL | 3 refills | Status: DC

## 2024-04-07 MED ORDER — IRBESARTAN 75 MG PO TABS
75.0000 mg | ORAL_TABLET | Freq: Every day | ORAL | 3 refills | Status: DC
Start: 2024-04-07 — End: 2024-05-26

## 2024-04-07 MED ORDER — IRBESARTAN 150 MG PO TABS: 150.0000 mg | ORAL_TABLET | Freq: Every day | ORAL | 3 refills | Status: DC

## 2024-04-07 NOTE — Patient Instructions (Signed)
 Increase Avapro  to 225 mg daily ( 150mg  tab plus 75mg  tab daily )

## 2024-04-07 NOTE — Addendum Note (Signed)
 Encounter addended by: Nathanel Bal, PA-C on: 04/07/2024 4:10 PM  Actions taken: Clinical Note Signed

## 2024-04-07 NOTE — Progress Notes (Addendum)
 Primary Care Physician: Wyn Heater, MD Primary Cardiologist: None Electrophysiologist: None     Referring Physician: Dr. Arthurine Billings Thomas Ferrell is a 83 y.o. male with a history of PAD (previous bilateral common femoral endarterectomies, right to left femoral-femoral bypass and left femoral to above knee popliteal bypass with Propaten on 12/09/13 by Dr. Nolene Baumgarten for ischemic left leg), carotid artery disease (history of right CEA on 10/21/06 by Dr. Nolene Baumgarten for symptomatic carotid stenosis), previous CVA/TIA, HTN, acute CHF 03/2024, HLD, COPD, atherosclerosis of aorta and coronary arteries by CT imaging, bladder cancer, previous spinal injury, and atrial fibrillation who presents for consultation in the [facility]. Hospital admission 5/6-06/2024 for SOB found to be in new Afib with RVR; diuresed for acute CHF. Patient noted to be in paroxysmal Afib during admission. Discharged on Lopressor  12.5 mg BID. Patient is on Eliquis  2.5 mg BID for a CHADS2VASC score of 7.  On evaluation today, he is currently in NSR. He is currently on Eliquis  2.5 mg BID. He has not noted any Afib episodes since hospital discharge. No SOB noted since being home.   Today, he denies symptoms of palpitations, chest pain,  orthopnea, PND, lower extremity edema, dizziness, presyncope, syncope, snoring, daytime somnolence, bleeding, or neurologic sequela. The patient is tolerating medications without difficulties and is otherwise without complaint today.    he has a BMI of Body mass index is 21.46 kg/m.Thomas Ferrell Filed Weights   04/07/24 1421  Weight: 56.7 kg    Current Outpatient Medications  Medication Sig Dispense Refill   acetaminophen  (TYLENOL ) 650 MG CR tablet Take 650 mg by mouth 2 (two) times daily.     apixaban  (ELIQUIS ) 2.5 MG TABS tablet Take 1 tablet (2.5 mg total) by mouth 2 (two) times daily. 180 tablet 3   aspirin  81 MG chewable tablet 1 tablet Orally Once a day     atorvastatin   (LIPITOR) 20 MG tablet Take 20 mg by mouth in the morning.     bismuth subsalicylate (PEPTO BISMOL) 262 MG chewable tablet Chew 262 mg by mouth as needed.     Dextromethorphan  HBr (COUGH SUPPRESSANT PO) Take 1 tablet by mouth as needed. [employer] Brand     fluticasone  (FLONASE ) 50 MCG/ACT nasal spray Place 2 sprays into both nostrils daily. (Patient taking differently: Place 2 sprays into both nostrils as needed.)     irbesartan  (AVAPRO ) 150 MG tablet Take 1 tablet (150 mg total) by mouth daily. 90 tablet 3   ketotifen (ZADITOR) 0.035 % ophthalmic solution Place 1 drop into both eyes daily as needed (Dry eye).     levalbuterol  (XOPENEX  HFA) 45 MCG/ACT inhaler Inhale 2 puffs into the lungs in the morning, at noon, and at bedtime for 4 days, THEN 2 puffs every 6 (six) hours as needed for wheezing. (Patient taking differently:  2 puffs every 6 (six) hours as needed for wheezing.) 15 g 2   loratadine  (CLARITIN ) 10 MG tablet Take 1 tablet (10 mg total) by mouth daily. (Patient taking differently: Take 10 mg by mouth as needed.)     metoprolol  tartrate (LOPRESSOR ) 25 MG tablet Take 0.5 tablets (12.5 mg total) by mouth 2 (two) times daily. 90 tablet 3   mometasone -formoterol  (DULERA ) 200-5 MCG/ACT AERO Inhale 2 puffs into the lungs 2 (two) times daily. 13 g 1   Multiple Vitamin (MULTIVITAMIN WITH MINERALS) TABS tablet Take 1 tablet by mouth daily.     pantoprazole  (PROTONIX ) 40 MG tablet Take  1 tablet (40 mg total) by mouth daily at 6 (six) AM. 90 tablet 3   Potassium Chloride  ER 20 MEQ TBCR Take 1 tablet by mouth every morning.     sertraline  (ZOLOFT ) 50 MG tablet Take 50 mg by mouth in the morning.     vitamin B-12 (CYANOCOBALAMIN ) 500 MCG tablet Take 500 mcg by mouth in the morning.     feeding supplement (ENSURE ENLIVE / ENSURE PLUS) LIQD Take 237 mLs by mouth 2 (two) times daily between meals. (Patient not taking: Reported on 04/07/2024)     No current facility-administered medications for this encounter.     Atrial Fibrillation Management history:  Previous antiarrhythmic drugs: none Previous cardioversions: none Previous ablations: none Anticoagulation history: Eliquis  2.5 mg BID   ROS- All systems are reviewed and negative except as per the HPI above.  Physical Exam: BP (!) 200/86   Pulse 86   Ht 5\' 4"  (1.626 m)   Wt 56.7 kg   BMI 21.46 kg/m   GEN: Well nourished, well developed in no acute distress NECK: No JVD; No carotid bruits CARDIAC: Regular rate and rhythm, no murmurs, rubs, gallops RESPIRATORY:  Clear to auscultation without rales, wheezing or rhonchi  ABDOMEN: Soft, non-tender, non-distended EXTREMITIES:  No edema; No deformity   EKG today demonstrates  Vent. rate 86 BPM PR interval 146 ms QRS duration 74 ms QT/QTcB 406/485 ms P-R-T axes 91 57 42 Sinus rhythm with Premature atrial complexes with Abberant conduction Nonspecific ST abnormality Prolonged QT Abnormal ECG When compared with ECG of 17-Mar-2024 15:18, PREVIOUS ECG IS PRESENT  Echo 03/18/24 demonstrated  1. Left ventricular ejection fraction, by estimation, is 60 to 65%. The  left ventricle has normal function. The left ventricle has no regional  wall motion abnormalities. Left ventricular diastolic parameters are  consistent with Grade I diastolic  dysfunction (impaired relaxation).   2. Right ventricular systolic function is normal. The right ventricular  size is normal. There is mildly elevated pulmonary artery systolic  pressure.   3. The mitral valve is normal in structure. No evidence of mitral valve  regurgitation. No evidence of mitral stenosis.   4. The aortic valve is calcified. There is moderate calcification of the  aortic valve. There is moderate thickening of the aortic valve. Aortic  valve regurgitation is not visualized. Mild aortic valve stenosis. Aortic  valve mean gradient measures 9.5  mmHg. Aortic valve Vmax measures 2.00 m/s.   5. The inferior vena cava is normal in size  with greater than 50%  respiratory variability, suggesting right atrial pressure of 3 mmHg.    ASSESSMENT & PLAN CHA2DS2-VASc Score = 7  The patient's score is based upon: CHF History: 1 HTN History: 1 Diabetes History: 0 Stroke History: 2 Vascular Disease History: 1 Age Score: 2 Gender Score: 0       ASSESSMENT AND PLAN: Paroxysmal Atrial Fibrillation (ICD10:  I48.0) The patient's CHA2DS2-VASc score is 7, indicating a 11.2% annual risk of stroke.    He is currently in NSR. Will continue Lopressor  12.5 mg BID. Check Bmet today from hospital follow up.   Secondary Hypercoagulable State (ICD10:  D68.69) The patient is at significant risk for stroke/thromboembolism based upon his CHA2DS2-VASc Score of 7.  Continue Apixaban  (Eliquis ).  Dosage of 2.5 mg is correct based on age and weight below 60 kg.  Per Dr. Rosalva Comber vascular surgery patient is to stop Plavix  and continue ASA with Eliquis .   Hypertension Markedly elevated blood pressure today. Patient endorses  mild headache. Will increase Avapro  (currently on 150 mg daily) and add 75 mg daily to regimen for total of 225 mg daily. Recommend BP diary and can provide to Cardiology at upcoming visit. Defer BP management to Cardiology, appreciate recommendations with how to control markedly elevated BP. I advised patient to call clinic in 2-3 days with BP readings so we can adjust as needed.   Follow up Afib clinic September.    Minnie Amber, PA-C  [facility] [address] [place], [place] [address] [phone]

## 2024-04-09 ENCOUNTER — Ambulatory Visit (HOSPITAL_COMMUNITY): Payer: Self-pay | Admitting: Internal Medicine

## 2024-04-09 ENCOUNTER — Other Ambulatory Visit (HOSPITAL_COMMUNITY): Payer: Self-pay | Admitting: *Deleted

## 2024-04-09 ENCOUNTER — Other Ambulatory Visit: Payer: Self-pay | Admitting: Vascular Surgery

## 2024-04-09 ENCOUNTER — Telehealth: Payer: Self-pay

## 2024-04-09 DIAGNOSIS — I70239 Atherosclerosis of native arteries of right leg with ulceration of unspecified site: Secondary | ICD-10-CM

## 2024-04-09 DIAGNOSIS — I739 Peripheral vascular disease, unspecified: Secondary | ICD-10-CM

## 2024-04-09 DIAGNOSIS — I700 Atherosclerosis of aorta: Secondary | ICD-10-CM

## 2024-04-09 DIAGNOSIS — Z95828 Presence of other vascular implants and grafts: Secondary | ICD-10-CM

## 2024-04-09 LAB — BASIC METABOLIC PANEL WITH GFR
BUN/Creatinine Ratio: 10 (ref 10–24)
BUN: 7 mg/dL — ABNORMAL LOW (ref 8–27)
CO2: 18 mmol/L — ABNORMAL LOW (ref 20–29)
Calcium: 8.7 mg/dL (ref 8.6–10.2)
Chloride: 99 mmol/L (ref 96–106)
Creatinine, Ser: 0.68 mg/dL — ABNORMAL LOW (ref 0.76–1.27)
Glucose: 60 mg/dL — ABNORMAL LOW (ref 70–99)
Potassium: 3.4 mmol/L — ABNORMAL LOW (ref 3.5–5.2)
Sodium: 141 mmol/L (ref 134–144)
eGFR: 93 mL/min/{1.73_m2} (ref >59)

## 2024-04-09 MED ORDER — POTASSIUM CHLORIDE ER 20 MEQ PO TBCR
1.5000 | EXTENDED_RELEASE_TABLET | Freq: Every morning | ORAL | 3 refills | Status: AC
Start: 2024-04-09 — End: ?

## 2024-04-09 NOTE — Telephone Encounter (Signed)
 Pt called c/o worsening Left leg pain, particularly when lying down.   Pt reported pink scaly skin but has intermittent pain, cold foot.  Patient originally stated he didn't want to have any intervention because he was afraid he would lose his leg.   Educated pt that intervention can be done in an attempt to open arteries and restore blood flow to the leg hopefully preventing limb loss.   Pt then agreed that he would consider intervention.  Consulted Dr. Rosalva Comber who suggested pt come in to discuss possible intervention. Appropriate ultra-sounds and follow-up with PA were scheduled for 04/10/24.

## 2024-04-10 ENCOUNTER — Ambulatory Visit (HOSPITAL_COMMUNITY)
Admission: RE | Admit: 2024-04-10 | Discharge: 2024-04-10 | Disposition: A | Discharge status: Home / Self Care | Source: Ambulatory Visit | Attending: Vascular Surgery | Admitting: Vascular Surgery

## 2024-04-10 ENCOUNTER — Ambulatory Visit: Attending: Vascular Surgery | Admitting: Physician Assistant

## 2024-04-10 VITALS — BP 198/78 | HR 77 | Temp 98.0°F | Ht 64.0 in | Wt 125.0 lb

## 2024-04-10 DIAGNOSIS — I700 Atherosclerosis of aorta: Secondary | ICD-10-CM | POA: Diagnosis not present

## 2024-04-10 DIAGNOSIS — M5410 Radiculopathy, site unspecified: Secondary | ICD-10-CM

## 2024-04-10 DIAGNOSIS — Z95828 Presence of other vascular implants and grafts: Secondary | ICD-10-CM

## 2024-04-10 DIAGNOSIS — I70239 Atherosclerosis of native arteries of right leg with ulceration of unspecified site: Secondary | ICD-10-CM

## 2024-04-10 DIAGNOSIS — I739 Peripheral vascular disease, unspecified: Secondary | ICD-10-CM

## 2024-04-10 LAB — VAS US ABI WITH/WO TBI: Left ABI: 0.73

## 2024-04-10 MED ORDER — TRAMADOL HCL 50 MG PO TABS: 50.0000 mg | ORAL_TABLET | Freq: Four times a day (QID) | ORAL | 0 refills | Status: DC | PRN

## 2024-04-10 NOTE — Progress Notes (Signed)
 Office Note     CC:  follow up Requesting Provider:  Wyn Heater, MD  HPI: Thomas Ferrell is a 83 y.o. (August 17, 1941) male who presents as triage work in for LLE pain. He is here today with his wife. He explains that over past 4 days he has been up every 2 hours with excruciating pain that starts in left lower back and is running down left leg. This occurs only at night. Seems to be positional and is most painful when laying down. He has very extensive long history of back pain and has undergone several back surgeries in the past. He has progressive lumbar spine degeneration and does not wish to pursue further surgery. As a result of his past surgeries he has a chronic leg great toe drop in which he often wears a brace. He denies any recent change in activity or fall. He has been walking a little more with his walker but struggles with numbness in the left leg. He denies any pain in his legs on ambulation though and no pain at rest. No tissue loss. He says he knows this is all related to his back.  He has remote history of bilateral common femoral endarterectomies, right to left femoral-femoral bypass and left femoral to above knee popliteal bypass with Propaten on 12/09/13 by Dr. Nolene Baumgarten for ischemic left leg. He has done well since his surgeries with minimal lower extremity symptoms. He most recently underwent right EIA drug-coated balloon angioplasty for elevated inflow velocities to his fem-fem bypass that is providing perfusion to the left lower extremity and left-sided femoral-popliteal bypass in August of 2024 by Dr. Rosalva Comber.  On follow up he has had some continued EIA and CF elevated velocities. He has been offered surgical revision of the right groin to improve external iliac inflow.  However, he has repeatedly declined, and elected for continued medical management. He is medically managed on Eliquis , Aspirin  and Statin    Remote history of right CEA on 10/21/06 by Dr. Nolene Baumgarten for symptomatic  carotid stenosis. Today he denies changes in vision, slurred speech, facial drooping, unilateral upper or lower extremity weakness or numbness.    Past Medical History:  Diagnosis Date   Abnormality of gait 03/03/2013   bladder ca dx'd 11/2009   chemo/xrt comp 02/2010   Cerebrovascular disease    right brain CVA 2007   COPD (chronic obstructive pulmonary disease) (HCC)    Cough productive of clear sputum    TX RECENT SINUS INFECTION    CVA (cerebral vascular accident) (HCC) 2007   r-cva   Dyslipidemia    Hypertension    Lumbago     Past Surgical History:  Procedure Laterality Date   ABDOMINAL AORTAGRAM N/A 12/07/2013   Procedure: ABDOMINAL Tommi Fraise;  Surgeon: Dannis Dy, MD;  Location: [facility] CATH LAB;  Service: Cardiovascular;  Laterality: N/A;   ABDOMINAL AORTOGRAM W/LOWER EXTREMITY N/A 06/26/2023   Procedure: ABDOMINAL AORTOGRAM W/LOWER EXTREMITY;  Surgeon: Kayla Part, MD;  Location: [facility] INVASIVE CV LAB;  Service: Cardiovascular;  Laterality: N/A;   BACK SURGERY  2010   bladder cancer     CAROTID ARTERY ANGIOPLASTY  2009   CHOLECYSTECTOMY N/A 06/06/2016   Procedure: LAPAROSCOPIC CHOLECYSTECTOMY WITH INTRAOPERATIVE CHOLANGIOGRAM;  Surgeon: Oralee Billow, MD;  Location: [facility] ORS;  Service: General;  Laterality: N/A;   ERCP N/A 06/05/2016   Procedure: ENDOSCOPIC RETROGRADE CHOLANGIOPANCREATOGRAPHY (ERCP);  Surgeon: Delilah Fend, MD;  Location: [facility] ENDOSCOPY;  Service: Endoscopy;  Laterality: N/A;   ESOPHAGOGASTRODUODENOSCOPY (EGD)  WITH PROPOFOL  N/A 06/05/2016   Procedure: ESOPHAGOGASTRODUODENOSCOPY (EGD);  Surgeon: Delilah Fend, MD;  Location: [facility] ENDOSCOPY;  Service: Endoscopy;  Laterality: N/A;   FEMORAL-FEMORAL BYPASS GRAFT N/A 12/09/2013   Procedure: BYPASS GRAFT FEMORAL-FEMORAL ARTERY WITH BILATERAL ENDARTERECTOMY ;  Surgeon: Richrd Char, MD;  Location: [facility] OR;  Service: Vascular;  Laterality: N/A;   FEMORAL-POPLITEAL BYPASS GRAFT Left 12/09/2013   Procedure: BYPASS GRAFT  FEMORAL-POPLITEAL ARTERY - LEFT ;  Surgeon: Richrd Char, MD;  Location: [facility] OR;  Service: Vascular;  Laterality: Left;   LAMINECTOMY  09/08/2013   L 2 L3 L4 L5       Dr Susen Epstein   LOWER EXTREMITY ANGIOGRAPHY Right 07/03/2023   Procedure: Lower Extremity Angiography;  Surgeon: Kayla Part, MD;  Location: [facility] INVASIVE CV LAB;  Service: Cardiovascular;  Laterality: Right;   PERIPHERAL VASCULAR BALLOON ANGIOPLASTY Right 07/03/2023   Procedure: PERIPHERAL VASCULAR BALLOON ANGIOPLASTY;  Surgeon: Kayla Part, MD;  Location: [facility] INVASIVE CV LAB;  Service: Cardiovascular;  Laterality: Right;  R external iliac    Social History   Socioeconomic History   Marital status: Married    Spouse name: Not on file   Number of children: 2   Years of education: College   Highest education level: Not on file  Occupational History    Employer: RETIRED    Comment: Retired  Tobacco Use   Smoking status: Former    Current packs/day: 0.00    Types: Cigarettes    Quit date: 11/12/2005    Years since quitting: 18.4    Passive exposure: Never   Smokeless tobacco: Never  Vaping Use   Vaping status: Never Used  Substance and Sexual Activity   Alcohol  use: No   Drug use: No   Sexual activity: Not on file  Other Topics Concern   Not on file  Social History Narrative   Not on file   Social Drivers of Health   Financial Resource Strain: Not on file  Food Insecurity: No Food Insecurity (03/18/2024)   Hunger Vital Sign    Worried About Running Out of Food in the Last Year: Never true    Ran Out of Food in the Last Year: Never true  Transportation Needs: No Transportation Needs (03/18/2024)   PRAPARE - Administrator, Civil Service (Medical): No    Lack of Transportation (Non-Medical): No  Physical Activity: Not on file  Stress: Not on file  Social Connections: Moderately Isolated (03/18/2024)   Social Connection and Isolation Panel [NHANES]    Frequency of Communication with Friends and  Family: Never    Frequency of Social Gatherings with Friends and Family: Never    Attends Religious Services: Never    Database administrator or Organizations: Yes    Attends Banker Meetings: Never    Marital Status: Married  Catering manager Violence: Not At Risk (03/18/2024)   Humiliation, Afraid, Rape, and Kick questionnaire    Fear of Current or Ex-Partner: No    Emotionally Abused: No    Physically Abused: No    Sexually Abused: No    Family History  Problem Relation Age of Onset   Stroke Mother    Cancer Father    Dementia Father    Cancer Sister     Current Outpatient Medications  Medication Sig Dispense Refill   acetaminophen  (TYLENOL ) 650 MG CR tablet Take 650 mg by mouth 2 (two) times daily.     apixaban  (ELIQUIS ) 2.5 MG  TABS tablet Take 1 tablet (2.5 mg total) by mouth 2 (two) times daily. 180 tablet 3   aspirin  81 MG chewable tablet 1 tablet Orally Once a day     atorvastatin  (LIPITOR) 20 MG tablet Take 20 mg by mouth in the morning.     bismuth subsalicylate (PEPTO BISMOL) 262 MG chewable tablet Chew 262 mg by mouth as needed.     Dextromethorphan  HBr (COUGH SUPPRESSANT PO) Take 1 tablet by mouth as needed. [employer]     feeding supplement (ENSURE ENLIVE / ENSURE PLUS) LIQD Take 237 mLs by mouth 2 (two) times daily between meals.     fluticasone  (FLONASE ) 50 MCG/ACT nasal spray Place 2 sprays into both nostrils daily. (Patient taking differently: Place 2 sprays into both nostrils as needed.)     irbesartan  (AVAPRO ) 150 MG tablet Take 1 tablet (150 mg total) by mouth daily. In addition to 75 mg daily to equal 225 mg daily 90 tablet 3   irbesartan  (AVAPRO ) 75 MG tablet Take 1 tablet (75 mg total) by mouth daily. In addition to 150 mg daily to equal 225 mg daily 90 tablet 3   ketotifen (ZADITOR) 0.035 % ophthalmic solution Place 1 drop into both eyes daily as needed (Dry eye).     levalbuterol  (XOPENEX  HFA) 45 MCG/ACT inhaler Inhale 2 puffs into the lungs in  the morning, at noon, and at bedtime for 4 days, THEN 2 puffs every 6 (six) hours as needed for wheezing. (Patient taking differently:  2 puffs every 6 (six) hours as needed for wheezing.) 15 g 2   loratadine  (CLARITIN ) 10 MG tablet Take 1 tablet (10 mg total) by mouth daily. (Patient taking differently: Take 10 mg by mouth as needed.)     metoprolol  tartrate (LOPRESSOR ) 25 MG tablet Take 0.5 tablets (12.5 mg total) by mouth 2 (two) times daily. 90 tablet 3   mometasone -formoterol  (DULERA ) 200-5 MCG/ACT AERO Inhale 2 puffs into the lungs 2 (two) times daily. 13 g 1   Multiple Vitamin (MULTIVITAMIN WITH MINERALS) TABS tablet Take 1 tablet by mouth daily.     pantoprazole  (PROTONIX ) 40 MG tablet Take 1 tablet (40 mg total) by mouth daily at 6 (six) AM. 90 tablet 3   Potassium Chloride  ER 20 MEQ TBCR Take 1.5 tablets (30 mEq total) by mouth every morning. 75 tablet 3   sertraline  (ZOLOFT ) 50 MG tablet Take 50 mg by mouth in the morning.     traMADol  (ULTRAM ) 50 MG tablet Take 1 tablet (50 mg total) by mouth every 6 (six) hours as needed for severe pain (pain score 7-10). 30 tablet 0   vitamin B-12 (CYANOCOBALAMIN ) 500 MCG tablet Take 500 mcg by mouth in the morning.     No current facility-administered medications for this visit.    Allergies  Allergen Reactions   Hydrocodone  Other (See Comments)    hallucinations   Morphine  And Codeine Itching     REVIEW OF SYSTEMS:  [X]  denotes positive finding, [ ]  denotes negative finding Cardiac  Comments:  Chest pain or chest pressure:    Shortness of breath upon exertion:    Short of breath when lying flat:    Irregular heart rhythm:        Vascular    Pain in calf, thigh, or hip brought on by ambulation:    Pain in feet at night that wakes you up from your sleep:     Blood clot in your veins:    Leg swelling:  Pulmonary    Oxygen  at home:    Productive cough:     Wheezing:         Neurologic    Sudden weakness in arms or legs:      Sudden numbness in arms or legs:     Sudden onset of difficulty speaking or slurred speech:    Temporary loss of vision in one eye:     Problems with dizziness:         Gastrointestinal    Blood in stool:     Vomited blood:         Genitourinary    Burning when urinating:     Blood in urine:        Psychiatric    Major depression:         Hematologic    Bleeding problems:    Problems with blood clotting too easily:        Skin    Rashes or ulcers:        Constitutional    Fever or chills:      PHYSICAL EXAMINATION:  Vitals:   04/10/24 1112  BP: (!) 198/78  Pulse: 77  Temp: 98 F (36.7 C)  TempSrc: Temporal  SpO2: 94%  Weight: 125 lb (56.7 kg)  Height: 5\' 4"  (1.626 m)    General:  WDWN in NAD; vital signs documented above Gait: Not observed HENT: WNL, normocephalic Pulmonary: normal non-labored breathing Cardiac: regular HR Abdomen: soft Vascular Exam/Pulses: 2+ femoral pulses, palpable pulse in fem- fem, Doppler DP/ PT and faint pero signals bilaterally. Feet warm and well perfused Extremities: without ischemic changes, without Gangrene , without cellulitis; without open wounds;  Musculoskeletal: no muscle wasting or atrophy  Neurologic: A&O X 3 Psychiatric:  The pt has Normal affect.   Non-Invasive Vascular Imaging:   VAS US  lower extremity bypass graft:  Left Graft #1: fem-pop bypass graft  +--------------------+--------+--------+----------+--------+                     PSV cm/sStenosisWaveform  Comments  +--------------------+--------+--------+----------+--------+  Inflow             88                                  +--------------------+--------+--------+----------+--------+  Proximal Anastomosis138                                 +--------------------+--------+--------+----------+--------+  Proximal Graft      51              monophasic          +--------------------+--------+--------+----------+--------+  Mid Graft            32              monophasic          +--------------------+--------+--------+----------+--------+  Distal Graft        37              biphasic            +--------------------+--------+--------+----------+--------+  Distal Anastomosis  54              biphasic            +--------------------+--------+--------+----------+--------+  Outflow            43                                  +--------------------+--------+--------+----------+--------+  Distal left popliteal artery has biphasic flow, velocity 69 cm/sec.   Summary:  Left: Patent left fem-pop bypass graft.   VAS US  Aorta/IVC/iliacs: Abdominal Aorta Findings:  +--------+-------+----------+----------+----------+--------+--------+  LocationAP (cm)Trans (cm)PSV (cm/s)Waveform  ThrombusComments  +--------+-------+----------+----------+----------+--------+--------+  Proximal2.00  2.20      77                                    +--------+-------+----------+----------+----------+--------+--------+  Distal                  30        monophasic                  +--------+-------+----------+----------+----------+--------+--------+   Poorly visualized right common and external iliac arteries. Known left  iliac occlusion.   IVC/Iliac Findings:  +--------+------+--------+--------+   IVC   PatentThrombusComments  +--------+------+--------+--------+  IVC Proxpatent                  +--------+------+--------+--------+   Right Graft #1:  +---------------------+--------+---------------+--------+---------+  right to left fem-femPSV cm/sStenosis       WaveformComments   +---------------------+--------+---------------+--------+---------+  Inflow              246     50-74% stenosis        EIA        +---------------------+--------+---------------+--------+---------+  Prox Anastomosis     91                             turbulent   +---------------------+--------+---------------+--------+---------+  Proximal Graft       93                                        +---------------------+--------+---------------+--------+---------+  Mid Graft            48                                        +---------------------+--------+---------------+--------+---------+  Distal Graft         34                                        +---------------------+--------+---------------+--------+---------+  Distal Anastomosis   53                                        +---------------------+--------+---------------+--------+---------+  Outflow             25                     biphasic           +---------------------+--------+---------------+--------+---------+    Summary:  Stenosis:  Patent right to left fem-fem bypass graft with inflow disease. Low velocity flow in the graft.   +-------+-----------+-----------+------------+------------+  ABI/TBIToday's ABIToday's TBIPrevious ABIPrevious TBI  +-------+-----------+-----------+------------+------------+  Right N/C        .39        1.07        .44           +-------+-----------+-----------+------------+------------+  Left  .73        .45        .73         .64           +-------+-----------+-----------+------------+------------+     Evidence of medial calcification.  Bilateral ABIs and TBIs appear essentially unchanged compared to prior  study on 02/20/24.   ASSESSMENT/PLAN:: 83 y.o. male here as a triage work in for LLE pain. He is here today with his wife. He explains that over past 4 days he has been up every 2 hours with excruciating pain that starts in left lower back and is running down left leg. This occurs only at night. Seems to be positional and is most painful when laying down. He has very extensive long history of back pain and has undergone several back surgeries in the past. He has progressive lumbar spine degeneration  and does not wish to pursue further surgery. He has had known iliac disease which provides some threat to his fem- fem bypass that provides flow to both his right leg and also his left leg. His left leg additionally has a fem - popliteal bypass. His non invasive studies today show patent fem - fem and fem - pop however the velocities in the bypasses are trending down. His ABI overall is stable from 6 weeks ago. Dr. Rosalva Comber previously offered right sided iliofemoral endarterectomy with possible retrograde stenting. He remains uninterested in surgery at this time. Velocities have not changed so I think this is reasonable. I also do not think his current symptoms are a result of his lower extremity perfusion. Will continue with short interval follow ups. He and his wife are  aware that the stenosis appreciated in the right external iliac affects bilateral lower extremities including the right to left femoral-femoral bypass and femoral popliteal bypass in the left leg.  - continue Eliquis , Aspirin , and statin  - Prescription for Tramadol  50 mg #30 no refills sent to his pharmacy. I explained that I would provide this for them so that he can get some pain relief and hopefully some sleep in interim between today and getting into pain management - He understands we will not be able to provide any further pain medication - Referral has been made to pain management for ongoing pain control - He will follow up with us  in 3 months with ABI, LLE bypass graft duplex, and fem-fem duplex - he understands to call immediately or present to ER should he have any new or worsening syptoms   Deneen Finical, PA-C [facility] [phone]  Clinic MD:   Susi Eric

## 2024-04-13 ENCOUNTER — Other Ambulatory Visit: Payer: Self-pay | Admitting: *Deleted

## 2024-04-13 DIAGNOSIS — Z95828 Presence of other vascular implants and grafts: Secondary | ICD-10-CM

## 2024-04-13 DIAGNOSIS — I739 Peripheral vascular disease, unspecified: Secondary | ICD-10-CM

## 2024-04-17 DIAGNOSIS — R197 Diarrhea, unspecified: Secondary | ICD-10-CM | POA: Diagnosis not present

## 2024-04-17 DIAGNOSIS — I10 Essential (primary) hypertension: Secondary | ICD-10-CM | POA: Diagnosis not present

## 2024-04-21 ENCOUNTER — Telehealth: Payer: Self-pay

## 2024-04-21 DIAGNOSIS — Z87891 Personal history of nicotine dependence: Secondary | ICD-10-CM | POA: Diagnosis not present

## 2024-04-21 DIAGNOSIS — Z8673 Personal history of transient ischemic attack (TIA), and cerebral infarction without residual deficits: Secondary | ICD-10-CM | POA: Diagnosis not present

## 2024-04-21 DIAGNOSIS — M25511 Pain in right shoulder: Secondary | ICD-10-CM | POA: Diagnosis not present

## 2024-04-21 DIAGNOSIS — Z8551 Personal history of malignant neoplasm of bladder: Secondary | ICD-10-CM | POA: Diagnosis not present

## 2024-04-21 DIAGNOSIS — Z7901 Long term (current) use of anticoagulants: Secondary | ICD-10-CM | POA: Diagnosis not present

## 2024-04-21 DIAGNOSIS — M48062 Spinal stenosis, lumbar region with neurogenic claudication: Secondary | ICD-10-CM | POA: Diagnosis not present

## 2024-04-21 DIAGNOSIS — Z7982 Long term (current) use of aspirin: Secondary | ICD-10-CM | POA: Diagnosis not present

## 2024-04-21 DIAGNOSIS — E7800 Pure hypercholesterolemia, unspecified: Secondary | ICD-10-CM | POA: Diagnosis not present

## 2024-04-21 DIAGNOSIS — M19031 Primary osteoarthritis, right wrist: Secondary | ICD-10-CM | POA: Diagnosis not present

## 2024-04-21 DIAGNOSIS — M4726 Other spondylosis with radiculopathy, lumbar region: Secondary | ICD-10-CM | POA: Diagnosis not present

## 2024-04-21 DIAGNOSIS — G8929 Other chronic pain: Secondary | ICD-10-CM | POA: Diagnosis not present

## 2024-04-21 DIAGNOSIS — Z7902 Long term (current) use of antithrombotics/antiplatelets: Secondary | ICD-10-CM | POA: Diagnosis not present

## 2024-04-21 DIAGNOSIS — Z556 Problems related to health literacy: Secondary | ICD-10-CM | POA: Diagnosis not present

## 2024-04-21 DIAGNOSIS — Z981 Arthrodesis status: Secondary | ICD-10-CM | POA: Diagnosis not present

## 2024-04-21 DIAGNOSIS — M533 Sacrococcygeal disorders, not elsewhere classified: Secondary | ICD-10-CM | POA: Diagnosis not present

## 2024-04-21 DIAGNOSIS — J449 Chronic obstructive pulmonary disease, unspecified: Secondary | ICD-10-CM | POA: Diagnosis not present

## 2024-04-21 NOTE — Telephone Encounter (Signed)
 Alline Ivans called re: referral to Pain Clinic for her husband, Jakevious Hollister.  Referral had been made on 04/10/24.   Advised to call [facility] [phone].  Pt agreed.

## 2024-04-22 ENCOUNTER — Encounter: Payer: Self-pay | Admitting: Physical Medicine & Rehabilitation

## 2024-04-26 DIAGNOSIS — M533 Sacrococcygeal disorders, not elsewhere classified: Secondary | ICD-10-CM | POA: Diagnosis not present

## 2024-04-26 DIAGNOSIS — M4726 Other spondylosis with radiculopathy, lumbar region: Secondary | ICD-10-CM | POA: Diagnosis not present

## 2024-04-26 DIAGNOSIS — Z981 Arthrodesis status: Secondary | ICD-10-CM | POA: Diagnosis not present

## 2024-04-26 DIAGNOSIS — Z7901 Long term (current) use of anticoagulants: Secondary | ICD-10-CM | POA: Diagnosis not present

## 2024-04-26 DIAGNOSIS — Z87891 Personal history of nicotine dependence: Secondary | ICD-10-CM | POA: Diagnosis not present

## 2024-04-26 DIAGNOSIS — M25511 Pain in right shoulder: Secondary | ICD-10-CM | POA: Diagnosis not present

## 2024-04-26 DIAGNOSIS — Z556 Problems related to health literacy: Secondary | ICD-10-CM | POA: Diagnosis not present

## 2024-04-26 DIAGNOSIS — Z7982 Long term (current) use of aspirin: Secondary | ICD-10-CM | POA: Diagnosis not present

## 2024-04-26 DIAGNOSIS — G8929 Other chronic pain: Secondary | ICD-10-CM | POA: Diagnosis not present

## 2024-04-26 DIAGNOSIS — Z8551 Personal history of malignant neoplasm of bladder: Secondary | ICD-10-CM | POA: Diagnosis not present

## 2024-04-26 DIAGNOSIS — E7800 Pure hypercholesterolemia, unspecified: Secondary | ICD-10-CM | POA: Diagnosis not present

## 2024-04-26 DIAGNOSIS — M19031 Primary osteoarthritis, right wrist: Secondary | ICD-10-CM | POA: Diagnosis not present

## 2024-04-26 DIAGNOSIS — J449 Chronic obstructive pulmonary disease, unspecified: Secondary | ICD-10-CM | POA: Diagnosis not present

## 2024-04-26 DIAGNOSIS — Z8673 Personal history of transient ischemic attack (TIA), and cerebral infarction without residual deficits: Secondary | ICD-10-CM | POA: Diagnosis not present

## 2024-04-26 DIAGNOSIS — Z7902 Long term (current) use of antithrombotics/antiplatelets: Secondary | ICD-10-CM | POA: Diagnosis not present

## 2024-04-26 DIAGNOSIS — M48062 Spinal stenosis, lumbar region with neurogenic claudication: Secondary | ICD-10-CM | POA: Diagnosis not present

## 2024-04-26 NOTE — Progress Notes (Unsigned)
 Cardiology Office Note:   Date:  04/27/2024  ID:  Thomas Ferrell, DOB 1941-03-06, MRN [id number] PCP: Wyn Heater, MD  [facility] Providers Cardiologist:  None    History of Present Illness:   Discussed the use of AI scribe software for clinical note transcription with the patient, who gave verbal consent to proceed.  History of Present Illness Thomas Ferrell is a 83 y.o. male with a history of PAD (previous bilateral common femoral endarterectomies, right to left femoral-femoral bypass and left femoral to above knee popliteal bypass with Propaten on 12/09/13 by Dr. Nolene Baumgarten for ischemic left leg), carotid artery disease (history of right CEA on 10/21/06 by Dr. Nolene Baumgarten for symptomatic carotid stenosis), previous CVA/TIA, HTN, acute CHF 03/2024, HLD, COPD, atherosclerosis of aorta and coronary arteries by CT imaging, bladder cancer, previous spinal injury, and atrial fibrillation.   Patient admitted 5/6-5/8 2025 with shortness of breath and was found to be in afib with RVR. He was treated for acute HFpEF (LVEF normal on admission echo) and discharged on Lopressor  12.5mg  BID, Eliquis  2.5mg  BID. Patient saw afib clinic on 5/27 and was noted to be in NSR. At that visit, was found to be very hypertensive and his Irbesartan  was increased to 150mg +75mg .  Patient presents for blood pressure management.  He has a history of atrial fibrillation and was recently hospitalized in early May for atrial fibrillation and shortness of breath. Since discharge, he feels significantly better and has not experienced any recurrence of atrial fibrillation symptoms such as rapid heartbeats or significant shortness of breath. However, his breathing is sometimes 'choppy'. He remains week and he uses a walker to aid mobility/has limited ability to walk. He reports some swelling in his legs since leaving the hospital, with symptoms varying at times.  He is experiencing issues with blood pressure management. At home,  he has recorded blood pressure readings in the 'nineties', but recent clinic visits have shown elevated readings, including 198/78 and 200/86. He is currently on irbesartan  225 mg daily, which was increased from 150 mg after his last atrial fibrillation clinic visit. He also takes metoprolol  tartrate 12.5 mg twice daily.  He is on a blood thinner, Eliquis , but has encountered issues with medication cost and availability. He takes Eliquis  2.5 mg twice daily and a baby aspirin . He recently ran out of Eliquis  and is concerned about the cost of refills.  He experiences chronic pain, particularly in his left leg, which affects his sleep. He has seen a vascular surgery PA for this issue and has been referred to a pain management specialist. He prefers not to take pain medications but seeks relief from his symptoms.  He has a history of COPD and appears to have had inhaler regimen started at time of recent hospital discharge. He reports wheezing, frequently at night, and has had pneumonia in the past.   He experiences disorientation and confusion, particularly at night, which is concerning to his spouse. He has episodes of waking up at night with panic attacks and difficulty breathing with wheezing. He also reports difficulty swallowing large tablets and has diarrhea.  He has a history of smoking but quit 'cold [place]'. He is concerned about the number of medications he is currently taking, which has increased since his atrial fibrillation diagnosis.  Studies Reviewed:    EKG:   EKG Interpretation Date/Time:  Monday April 27 2024 14:18:00 EDT Ventricular Rate:  99 PR Interval:  168 QRS Duration:  74 QT Interval:  364 QTC  Calculation: 467 R Axis:   36  Text Interpretation: Sinus rhythm with Premature atrial complexes Nonspecific ST and T wave abnormality When compared with ECG of 07-Apr-2024 15:24, Nonspecific T wave abnormality now evident in Lateral leads Confirmed by Leala Prince [phone]) on  04/27/2024 2:41:14 PM    03/18/24 TTE  IMPRESSIONS     1. Left ventricular ejection fraction, by estimation, is 60 to 65%. The  left ventricle has normal function. The left ventricle has no regional  wall motion abnormalities. Left ventricular diastolic parameters are  consistent with Grade I diastolic  dysfunction (impaired relaxation).   2. Right ventricular systolic function is normal. The right ventricular  size is normal. There is mildly elevated pulmonary artery systolic  pressure.   3. The mitral valve is normal in structure. No evidence of mitral valve  regurgitation. No evidence of mitral stenosis.   4. The aortic valve is calcified. There is moderate calcification of the  aortic valve. There is moderate thickening of the aortic valve. Aortic  valve regurgitation is not visualized. Mild aortic valve stenosis. Aortic  valve mean gradient measures 9.5  mmHg. Aortic valve Vmax measures 2.00 m/s.   5. The inferior vena cava is normal in size with greater than 50%  respiratory variability, suggesting right atrial pressure of 3 mmHg.   FINDINGS   Left Ventricle: Left ventricular ejection fraction, by estimation, is 60  to 65%. The left ventricle has normal function. The left ventricle has no  regional wall motion abnormalities. The left ventricular internal cavity  size was normal in size. There is   no left ventricular hypertrophy. Left ventricular diastolic parameters  are consistent with Grade I diastolic dysfunction (impaired relaxation).   Right Ventricle: The right ventricular size is normal. No increase in  right ventricular wall thickness. Right ventricular systolic function is  normal. There is mildly elevated pulmonary artery systolic pressure. The  tricuspid regurgitant velocity is 2.94   m/s, and with an assumed right atrial pressure of 3 mmHg, the estimated  right ventricular systolic pressure is 37.6 mmHg.   Left Atrium: Left atrial size was normal in size.    Right Atrium: Right atrial size was normal in size.   Pericardium: There is no evidence of pericardial effusion.   Mitral Valve: The mitral valve is normal in structure. No evidence of  mitral valve regurgitation. No evidence of mitral valve stenosis. MV peak  gradient, 8.5 mmHg. The mean mitral valve gradient is 6.0 mmHg.   Tricuspid Valve: The tricuspid valve is normal in structure. Tricuspid  valve regurgitation is trivial. No evidence of tricuspid stenosis.   Aortic Valve: The aortic valve is calcified. There is moderate  calcification of the aortic valve. There is moderate thickening of the  aortic valve. Aortic valve regurgitation is not visualized. Mild aortic  stenosis is present. Aortic valve mean gradient  measures 9.5 mmHg. Aortic valve peak gradient measures 16.0 mmHg. Aortic  valve area, by VTI measures 0.96 cm.   Pulmonic Valve: The pulmonic valve was normal in structure. Pulmonic valve  regurgitation is not visualized. No evidence of pulmonic stenosis.   Aorta: The aortic root is normal in size and structure.   Venous: The inferior vena cava is normal in size with greater than 50%  respiratory variability, suggesting right atrial pressure of 3 mmHg.   IAS/Shunts: No atrial level shunt detected by color flow Doppler.   Risk Assessment/Calculations:    CHA2DS2-VASc Score = 7   This indicates a 11.2%  annual risk of stroke. The patient's score is based upon: CHF History: 1 HTN History: 1 Diabetes History: 0 Stroke History: 2 Vascular Disease History: 1 Age Score: 2 Gender Score: 0    HYPERTENSION CONTROL Vitals:   04/27/24 1323 04/27/24 1400  BP: (!) 160/68 (!) 148/66    The patient's blood pressure is elevated above target today.  In order to address the patient's elevated BP: A new medication was prescribed today.           Physical Exam:   VS:  BP (!) 148/66   Pulse (!) 57   Ht 5' 4 (1.626 m)   Wt 125 lb (56.7 kg)   BMI 21.46 kg/m     Wt Readings from Last 3 Encounters:  04/27/24 125 lb (56.7 kg)  04/10/24 125 lb (56.7 kg)  04/07/24 125 lb (56.7 kg)     Physical Exam Vitals reviewed.  Constitutional:      Comments: Frail appearing  HENT:     Head: Normocephalic.   Eyes:     Pupils: Pupils are equal, round, and reactive to light.    Cardiovascular:     Rate and Rhythm: Normal rate. Rhythm irregular.     Pulses: Normal pulses.     Heart sounds: Normal heart sounds.  Pulmonary:     Effort: Pulmonary effort is normal.     Breath sounds: Rhonchi (bilaterally on inspiration) present.  Abdominal:     General: Abdomen is flat.     Palpations: Abdomen is soft.   Musculoskeletal:     Right lower leg: No edema.     Left lower leg: No edema.   Skin:    General: Skin is warm and dry.     Capillary Refill: Capillary refill takes less than 2 seconds.   Neurological:     General: No focal deficit present.     Mental Status: He is alert and oriented to person, place, and time.   Psychiatric:        Mood and Affect: Mood normal.        Behavior: Behavior normal.        Thought Content: Thought content normal.        Judgment: Judgment normal.      ASSESSMENT AND PLAN:    Assessment & Plan Atrial fibrillation Recently admitted with atrial fibrillation and RVR. Had spontaneous conversion to NSR prior to discharge. Sinus rhythm at afib clinic follow up. EKG today shows sinus rhythm with frequent premature atrial complexes. He has difficulty accessing Eliquis  due to cost. Discussed risk of stroke increases with atrial fibrillation, necessitating consistent anticoagulation. Discussed the importance of not missing doses of Eliquis  to prevent stroke. - Continue metoprolol  tartrate 12.5 mg twice daily. Will consider adjusting following heart monitor. - Provide samples of Eliquis  2.5 mg twice daily. - Provide paperwork for patient assistance for Eliquis . - Engage social work team to explore [place] assistance for  medication costs. - Heart monitor placed to assess afib burden.  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 Recent admission for volume overload in the setting of afib with RVR. Received IV lasix  inpatient. Current symptoms include exertional dyspnea and nocturnal dyspnea, though stable per patient and spouse. No significant leg swelling today.  - Add spironolactone 25 mg daily for fluid management and blood pressure control. - Check NT pro BNP with [facility] early next week to evaluate volume status and potassium/creatinine.  - Consider adding SGLT2 at next visit  Hypertension Persistently  elevated blood pressure despite recent increase in irbesartan . Blood pressure remains above goal, posing risk for congestive heart failure and other complications. Target blood pressure is 130/80 mmHg or less. - Add spironolactone 25 mg daily to regimen. - Continue Irbesartan  225mg  daily. - Continue Metoprolol  Tartrate 12.5mg  daily.  - Discontinue potassium supplement.  Peripheral vascular disease Significant peripheral vascular disease. Continues to follow with vascular clinic. - Per most recent [facility] note, ASA and Eliquis .   Chronic obstructive pulmonary disease (COPD) Patient reports history of COPD. Describes symptoms of occasional nocturnal wheezing and phlegm. Breathing improved since hospital discharge but still symptomatic. New prescriptions for duo nebulizers post-admission. - Refer to pulmonologist for management of COPD.  Coronary and aortic atherosclerosis Noted on CT imaging. No symptoms suggestive of obstructive CAD.  - Continue ASA 81mg , Atorvastatin  20mg .             Signed, Leala Prince, PA-C

## 2024-04-27 ENCOUNTER — Telehealth: Payer: Self-pay | Admitting: Licensed Clinical Social Worker

## 2024-04-27 ENCOUNTER — Ambulatory Visit: Attending: Cardiology

## 2024-04-27 ENCOUNTER — Ambulatory Visit: Attending: Cardiology | Admitting: Cardiology

## 2024-04-27 VITALS — BP 148/66 | HR 57 | Ht 64.0 in | Wt 125.0 lb

## 2024-04-27 DIAGNOSIS — E785 Hyperlipidemia, unspecified: Secondary | ICD-10-CM | POA: Diagnosis not present

## 2024-04-27 DIAGNOSIS — J441 Chronic obstructive pulmonary disease with (acute) exacerbation: Secondary | ICD-10-CM

## 2024-04-27 DIAGNOSIS — I700 Atherosclerosis of aorta: Secondary | ICD-10-CM | POA: Insufficient documentation

## 2024-04-27 DIAGNOSIS — I2510 Atherosclerotic heart disease of native coronary artery without angina pectoris: Secondary | ICD-10-CM | POA: Insufficient documentation

## 2024-04-27 DIAGNOSIS — I739 Peripheral vascular disease, unspecified: Secondary | ICD-10-CM | POA: Diagnosis not present

## 2024-04-27 DIAGNOSIS — I5032 Chronic diastolic (congestive) heart failure: Secondary | ICD-10-CM | POA: Insufficient documentation

## 2024-04-27 DIAGNOSIS — I4891 Unspecified atrial fibrillation: Secondary | ICD-10-CM | POA: Diagnosis not present

## 2024-04-27 DIAGNOSIS — I10 Essential (primary) hypertension: Secondary | ICD-10-CM | POA: Diagnosis not present

## 2024-04-27 MED ORDER — APIXABAN 2.5 MG PO TABS: 2.5000 mg | ORAL_TABLET | Freq: Two times a day (BID) | ORAL | 0 refills | Status: DC

## 2024-04-27 MED ORDER — SPIRONOLACTONE 25 MG PO TABS
25.0000 mg | ORAL_TABLET | Freq: Every day | ORAL | 3 refills | Status: AC
Start: 2024-04-27 — End: ?

## 2024-04-27 NOTE — Telephone Encounter (Signed)
 H&V Care Navigation CSW Progress Note  Clinical Social Worker contacted patient by phone to f/u on [place] referral request. Per RN pt is not established with [facility] but is interested in doing so, and has concerns about medication costs. No answer today at [phone]. Note this is pt wife's number left voicemail requesting return call, noted no DPR on file so requested wife have pt nearby if he is not directly returning my call. Will f/u as able.  Patient is participating in a Managed Medicaid Plan:  No, UHC Medicare   SDOH Screenings   Food Insecurity: No Food Insecurity (03/18/2024)  Housing: Unknown (03/18/2024)  Transportation Needs: No Transportation Needs (03/18/2024)  Utilities: Not At Risk (03/18/2024)  Social Connections: Moderately Isolated (03/18/2024)  Tobacco Use: Medium Risk (04/10/2024)    Nathen Balder, MSW, LCSW Clinical Social Worker [facility]/Vascular Care Navigation  [phone]- work cell phone (preferred)

## 2024-04-27 NOTE — Patient Instructions (Signed)
 Medication Instructions:  STOP Potassium  START Spironolactone 25 mg once daily in the morning  **SAMPLES: Eliquis  2.5 mg twice daily**  *If you need a refill on your cardiac medications before your next appointment, please call your pharmacy*  Lab Work: To be completed today: Pro-BNP   To be completed in 1 week: BMP  If you have labs (blood work) drawn today and your tests are completely normal, you will receive your results only by: MyChart Message (if you have MyChart) OR A paper copy in the mail If you have any lab test that is abnormal or we need to change your treatment, we will call you to review the results.  Testing/Procedures: Your physician has requested that you wear a Zio heart monitor for 7 days. This will be mailed to your home with instructions on how to apply the monitor and how to return it when finished. Please allow 2 weeks after returning the heart monitor before our office calls you with the results.   Follow-Up: At [facility], you and your health needs are our priority.  As part of our continuing mission to provide you with exceptional heart care, our providers are all part of one team.  This team includes your primary Cardiologist (physician) and Advanced Practice Providers or APPs (Physician Assistants and Nurse Practitioners) who all work together to provide you with the care you need, when you need it.  Your next appointment:   1 month(s)  Provider:   One of our Advanced Practice Providers (APPs): Melita Springer, PA-C  Friddie Jetty, NP Evaline Hill, NP  Theotis Flake, PA-C Lawana Pray, NP  Willis Harter, PA-C Lovette Rud, PA-C  Villa Esperanza, PA-C Ernest Dick, NP  Marlana Silvan, NP Marcie Sever, PA-C  Laquita Plant, PA-C    Dayna Dunn, PA-C  Scott Weaver, PA-C Palmer Bobo, NP Katlyn West, NP Callie Goodrich, PA-C  Evan Williams, PA-C Sheng Haley, PA-C  Xika Zhao, NP Kathleen Johnson, PA-C   We recommend signing up for the patient  portal called MyChart.  Sign up information is provided on this After Visit Summary.  MyChart is used to connect with patients for Virtual Visits (Telemedicine).  Patients are able to view lab/test results, encounter notes, upcoming appointments, etc.  Non-urgent messages can be sent to your provider as well.   To learn more about what you can do with MyChart, go to [email or web address].   Other Instructions You have been referred to care navigation with social work to help guide you to be set up at the [place]. ------------------------------------------------------------------- ZIO XT- Long Term Monitor Instructions  Your physician has requested you wear a ZIO patch monitor for 7 days.   This is a single patch monitor. [employer] supplies one patch monitor per enrollment. Additional  stickers are not available. Please do not apply patch if you will be having a Nuclear Stress Test,  Echocardiogram, Cardiac CT, MRI, or Chest Xray during the period you would be wearing the  monitor. The patch cannot be worn during these tests. You cannot remove and re-apply the  ZIO XT patch monitor.   Your ZIO patch monitor will be mailed 3 day USPS to your address on file. It may take 3-5 days  to receive your monitor after you have been enrolled.   Once you have received your monitor, please review the enclosed instructions. Your monitor  has already been registered assigning a specific monitor serial # to you.     Billing and Patient Assistance Program Information  We have supplied [employer] with any of your insurance information on file for billing purposes.  [employer] offers a sliding scale Patient Assistance Program for patients that do not have  insurance, or whose insurance does not completely cover the cost of the ZIO monitor.  You must apply for the Patient Assistance Program to qualify for this discounted rate.   To apply, please call [employer] at [phone], select option 4, select option 2, ask to  apply for  Patient Assistance Program. Sanna Crystal will ask your household income, and how many people  are in your household. They will quote your out-of-pocket cost based on that information.  [employer] will also be able to set up a 68-month, interest-free payment plan if needed.     Applying the monitor  Shave hair from upper left chest.  Hold abrader disc by orange tab. Rub abrader in 40 strokes over the upper left chest as  indicated in your monitor instructions.  Clean area with 4 enclosed alcohol  pads. Let dry.  Apply patch as indicated in monitor instructions. Patch will be placed under collarbone on left  side of chest with arrow pointing upward.  Rub patch adhesive wings for 2 minutes. Remove white label marked 1. Remove the white  label marked 2. Rub patch adhesive wings for 2 additional minutes.  While looking in a mirror, press and release button in center of patch. A small green light will  flash 3-4 times. This will be your only indicator that the monitor has been turned on.  Do not shower for the first 24 hours. You may shower after the first 24 hours.  Press the button if you feel a symptom. You will hear a small click. Record Date, Time and  Symptom in the Patient Logbook.  When you are ready to remove the patch, follow instructions on the last 2 pages of Patient  Logbook. Stick patch monitor onto the last page of Patient Logbook.   Place Patient Logbook in the blue and white box. Use locking tab on box and tape box closed  securely. The blue and white box has prepaid postage on it. Please place it in the mailbox as  soon as possible. Your physician should have your test results approximately 7 days after the  monitor has been mailed back to [facility].   Call [facility] Customer Care at [phone] if you have questions regarding  your ZIO XT patch monitor. Call them immediately if you see an orange light blinking on your  monitor.   If your monitor falls  off in less than 4 days, contact our Monitor department at [phone].   If your monitor becomes loose or falls off after 4 days call [employer] at [phone] for  suggestions on securing your monitor.

## 2024-04-27 NOTE — Progress Notes (Unsigned)
 Enrolled for [employer] to mail a ZIO XT long term holter monitor to the patients address on file.   Dr. Nahser/ (DOD) to read.

## 2024-04-28 ENCOUNTER — Telehealth: Payer: Self-pay | Admitting: Cardiology

## 2024-04-28 ENCOUNTER — Telehealth: Payer: Self-pay | Admitting: Licensed Clinical Social Worker

## 2024-04-28 ENCOUNTER — Other Ambulatory Visit (HOSPITAL_COMMUNITY): Payer: Self-pay

## 2024-04-28 NOTE — Telephone Encounter (Signed)
 Called pt's wife back and left a message informing them that pt needed to contact his PCP to get a refill on non cardiac medication mometazone-formoterol  (Dulera ) 200-5 mcg/act aero inhaler. I advised them that if the has any other cardiac problems, questions or concerns, to give our office a call.

## 2024-04-28 NOTE — Telephone Encounter (Signed)
*  STAT* If patient is at the pharmacy, call can be transferred to refill team.   1. Which medications need to be refilled? (please list name of each medication and dose if known) mometasone -formoterol  (DULERA ) 200-5 MCG/ACT AERO    2. Would you like to learn more about the convenience, safety, & potential cost savings by using the [facility]?     3. Are you open to using the [facility] (Type [facility]. ).   4. Which pharmacy/location (including street and city if local pharmacy) is medication to be sent to?  [id number] [place], [place] - [address]     5. Do they need a 30 day or 90 day supply? 30 day

## 2024-04-28 NOTE — Progress Notes (Signed)
 Heart and Vascular Care Navigation  04/28/2024  Thomas Ferrell [birth year] [id number]  Reason for Referral: interested in establishing with [facility] and community resources Patient is participating in a Managed Medicaid Plan: No, managed medicare only  Engaged with patient by telephone for initial visit for Heart and Vascular Care Coordination.                                                                                                   Assessment:           LCSW was able to reach pt and pt wife at [phone], both on call with patient permission. Introduced self, role, reason for call. Confirmed home address, PCP, and insurance. Pt and wife deny any issues with transportation, food, utilities, or housing but are interested in some resources for pt to get connected with [facility] if possible. Discussed how to connect with potential benefits and mailed pt that information from the [place]. They understand we are not able to do that as it requires enrollment with military discharge paperwork and several other documents that are [facility] only. Pt wife feels comfortable completing this. Pt Thomas Ferrell  is about $47/mo which they are able to manage at this time. We discussed Extra Help application through Social Security and how to access assistance with that application. We also discussed how to use the BMS application for assistance, but pt will need to meet 3%OOP before he could receive assistance.   No additional questions at this time. Pt and pt wife encouraged to call me as needed.                           HRT/VAS Care Coordination     Patients Home Cardiology Office --  [facility]   Outpatient Care Team Social Worker   Social Worker Name: Nathen Balder, [place], [phone]   Living arrangements for the past 2 months Single Family Home   Lives with: Spouse   Patient Current Insurance Coverage Managed Medicare   Patient Has Concern With Paying Medical Bills No   Does Patient Have Prescription Coverage? Yes    Home Assistive Devices/Equipment None   DME Agency [facility]   [facility] Agency [facility]       Social History:                                                                             SDOH Screenings   Food Insecurity: No Food Insecurity (04/28/2024)  Housing: Low Risk  (04/28/2024)  Transportation Needs: No Transportation Needs (04/28/2024)  Utilities: Not At Risk (04/28/2024)  Financial Resource Strain: Low Risk  (04/28/2024)  Social Connections: Moderately Isolated (03/18/2024)  Tobacco Use: Medium Risk (04/10/2024)  Health Literacy: Adequate Health Literacy (04/28/2024)  SDOH Interventions: Financial Resources:  Corporate treasurer Interventions: [employer] Provided ([facility] referral information to establish if eligible; discussed Extra Help program and BMS application for Thomas Ferrell  assistance)   Food Insecurity:  Food Insecurity Interventions: Intervention Not Indicated  Housing Insecurity:  Housing Interventions: Intervention Not Indicated  Transportation:   Transportation Interventions: Intervention Not Indicated    Other Care Navigation Interventions:     Provided Pharmacy assistance resources  Discussed Extra Help application assistance from [facility] and BMS application for assistance since [place] often takes time to establish with if eligible   Follow-up plan:   LCSW has mailed pt the following: my card, [employer], [facility] enrollment application and information. Remain available as needed for any additional questions. Pt/pt wife have been encouraged to call me as needed.

## 2024-04-29 DIAGNOSIS — Z981 Arthrodesis status: Secondary | ICD-10-CM | POA: Diagnosis not present

## 2024-04-29 DIAGNOSIS — Z8551 Personal history of malignant neoplasm of bladder: Secondary | ICD-10-CM | POA: Diagnosis not present

## 2024-04-29 DIAGNOSIS — M19031 Primary osteoarthritis, right wrist: Secondary | ICD-10-CM | POA: Diagnosis not present

## 2024-04-29 DIAGNOSIS — Z7902 Long term (current) use of antithrombotics/antiplatelets: Secondary | ICD-10-CM | POA: Diagnosis not present

## 2024-04-29 DIAGNOSIS — G8929 Other chronic pain: Secondary | ICD-10-CM | POA: Diagnosis not present

## 2024-04-29 DIAGNOSIS — M48062 Spinal stenosis, lumbar region with neurogenic claudication: Secondary | ICD-10-CM | POA: Diagnosis not present

## 2024-04-29 DIAGNOSIS — Z87891 Personal history of nicotine dependence: Secondary | ICD-10-CM | POA: Diagnosis not present

## 2024-04-29 DIAGNOSIS — M4726 Other spondylosis with radiculopathy, lumbar region: Secondary | ICD-10-CM | POA: Diagnosis not present

## 2024-04-29 DIAGNOSIS — Z7901 Long term (current) use of anticoagulants: Secondary | ICD-10-CM | POA: Diagnosis not present

## 2024-04-29 DIAGNOSIS — M533 Sacrococcygeal disorders, not elsewhere classified: Secondary | ICD-10-CM | POA: Diagnosis not present

## 2024-04-29 DIAGNOSIS — Z8673 Personal history of transient ischemic attack (TIA), and cerebral infarction without residual deficits: Secondary | ICD-10-CM | POA: Diagnosis not present

## 2024-04-29 DIAGNOSIS — M25511 Pain in right shoulder: Secondary | ICD-10-CM | POA: Diagnosis not present

## 2024-04-29 DIAGNOSIS — Z7982 Long term (current) use of aspirin: Secondary | ICD-10-CM | POA: Diagnosis not present

## 2024-04-29 DIAGNOSIS — J449 Chronic obstructive pulmonary disease, unspecified: Secondary | ICD-10-CM | POA: Diagnosis not present

## 2024-04-29 DIAGNOSIS — Z556 Problems related to health literacy: Secondary | ICD-10-CM | POA: Diagnosis not present

## 2024-04-29 DIAGNOSIS — E7800 Pure hypercholesterolemia, unspecified: Secondary | ICD-10-CM | POA: Diagnosis not present

## 2024-05-04 DIAGNOSIS — I10 Essential (primary) hypertension: Secondary | ICD-10-CM | POA: Diagnosis not present

## 2024-05-04 DIAGNOSIS — I4891 Unspecified atrial fibrillation: Secondary | ICD-10-CM | POA: Diagnosis not present

## 2024-05-04 LAB — BASIC METABOLIC PANEL WITH GFR
BUN/Creatinine Ratio: 14 (ref 10–24)
BUN: 11 mg/dL (ref 8–27)
CO2: 19 mmol/L — ABNORMAL LOW (ref 20–29)
Calcium: 8.2 mg/dL — ABNORMAL LOW (ref 8.6–10.2)
Chloride: 101 mmol/L (ref 96–106)
Creatinine, Ser: 0.77 mg/dL (ref 0.76–1.27)
Glucose: 144 mg/dL — ABNORMAL HIGH (ref 70–99)
Potassium: 3.2 mmol/L — ABNORMAL LOW (ref 3.5–5.2)
Sodium: 139 mmol/L (ref 134–144)
eGFR: 90 mL/min/{1.73_m2} (ref >59)

## 2024-05-05 ENCOUNTER — Ambulatory Visit: Payer: Self-pay | Admitting: Physician Assistant

## 2024-05-05 DIAGNOSIS — E876 Hypokalemia: Secondary | ICD-10-CM

## 2024-05-05 MED ORDER — POTASSIUM CHLORIDE ER 10 MEQ PO TBCR: 40.0000 meq | EXTENDED_RELEASE_TABLET | Freq: Every day | ORAL | 6 refills | Status: DC

## 2024-05-07 ENCOUNTER — Telehealth: Payer: Self-pay | Admitting: Cardiology

## 2024-05-07 NOTE — Telephone Encounter (Signed)
 Per pt's wife pt is sleeping and no DPR on file Instructed for pt to call back when awakens ./cy

## 2024-05-07 NOTE — Telephone Encounter (Signed)
 Pt c/o medication issue:  1. Name of Medication: Potassium  2. How are you currently taking this medication (dosage and times per day)?   3. Are you having a reaction (difficulty breathing--STAT)?   4. What is your medication issue? Patient wants to know why his Potassium medicine was changed and also wanted to know if Artist [employer] is aware of this change?

## 2024-05-09 ENCOUNTER — Telehealth: Payer: Self-pay

## 2024-05-09 ENCOUNTER — Emergency Department (HOSPITAL_COMMUNITY)
Admission: EM | Admit: 2024-05-09 | Discharge: 2024-05-09 | Disposition: A | Discharge status: Home / Self Care | Attending: Emergency Medicine | Admitting: Emergency Medicine

## 2024-05-09 ENCOUNTER — Emergency Department (HOSPITAL_COMMUNITY)

## 2024-05-09 ENCOUNTER — Other Ambulatory Visit: Payer: Self-pay

## 2024-05-09 ENCOUNTER — Encounter (HOSPITAL_COMMUNITY): Payer: Self-pay

## 2024-05-09 DIAGNOSIS — R609 Edema, unspecified: Secondary | ICD-10-CM | POA: Diagnosis not present

## 2024-05-09 DIAGNOSIS — M85832 Other specified disorders of bone density and structure, left forearm: Secondary | ICD-10-CM | POA: Diagnosis not present

## 2024-05-09 DIAGNOSIS — R0789 Other chest pain: Secondary | ICD-10-CM | POA: Insufficient documentation

## 2024-05-09 DIAGNOSIS — Z79899 Other long term (current) drug therapy: Secondary | ICD-10-CM | POA: Insufficient documentation

## 2024-05-09 DIAGNOSIS — Z7901 Long term (current) use of anticoagulants: Secondary | ICD-10-CM | POA: Diagnosis not present

## 2024-05-09 DIAGNOSIS — S5001XA Contusion of right elbow, initial encounter: Secondary | ICD-10-CM | POA: Insufficient documentation

## 2024-05-09 DIAGNOSIS — Z7982 Long term (current) use of aspirin: Secondary | ICD-10-CM | POA: Diagnosis not present

## 2024-05-09 DIAGNOSIS — Z043 Encounter for examination and observation following other accident: Secondary | ICD-10-CM | POA: Diagnosis not present

## 2024-05-09 DIAGNOSIS — W06XXXA Fall from bed, initial encounter: Secondary | ICD-10-CM | POA: Insufficient documentation

## 2024-05-09 DIAGNOSIS — M25521 Pain in right elbow: Secondary | ICD-10-CM | POA: Diagnosis not present

## 2024-05-09 DIAGNOSIS — M79601 Pain in right arm: Secondary | ICD-10-CM | POA: Diagnosis not present

## 2024-05-09 DIAGNOSIS — T148XXA Other injury of unspecified body region, initial encounter: Secondary | ICD-10-CM

## 2024-05-09 DIAGNOSIS — S20212A Contusion of left front wall of thorax, initial encounter: Secondary | ICD-10-CM | POA: Diagnosis not present

## 2024-05-09 DIAGNOSIS — W19XXXA Unspecified fall, initial encounter: Secondary | ICD-10-CM

## 2024-05-09 DIAGNOSIS — S5002XA Contusion of left elbow, initial encounter: Secondary | ICD-10-CM | POA: Insufficient documentation

## 2024-05-09 DIAGNOSIS — M85822 Other specified disorders of bone density and structure, left upper arm: Secondary | ICD-10-CM | POA: Diagnosis not present

## 2024-05-09 DIAGNOSIS — M8580 Other specified disorders of bone density and structure, unspecified site: Secondary | ICD-10-CM | POA: Diagnosis not present

## 2024-05-09 DIAGNOSIS — M85831 Other specified disorders of bone density and structure, right forearm: Secondary | ICD-10-CM | POA: Diagnosis not present

## 2024-05-09 DIAGNOSIS — R0781 Pleurodynia: Secondary | ICD-10-CM | POA: Diagnosis not present

## 2024-05-09 DIAGNOSIS — M85821 Other specified disorders of bone density and structure, right upper arm: Secondary | ICD-10-CM | POA: Diagnosis not present

## 2024-05-09 DIAGNOSIS — M79602 Pain in left arm: Secondary | ICD-10-CM | POA: Diagnosis not present

## 2024-05-09 LAB — BASIC METABOLIC PANEL WITH GFR
Anion gap: 16 — ABNORMAL HIGH (ref 5–15)
BUN: 9 mg/dL (ref 8–23)
CO2: 20 mmol/L — ABNORMAL LOW (ref 22–32)
Calcium: 8.4 mg/dL — ABNORMAL LOW (ref 8.9–10.3)
Chloride: 103 mmol/L (ref 98–111)
Creatinine, Ser: 0.65 mg/dL (ref 0.61–1.24)
GFR, Estimated: >60 mL/min (ref >60)
Glucose, Bld: 106 mg/dL — ABNORMAL HIGH (ref 70–99)
Potassium: 3.6 mmol/L (ref 3.5–5.1)
Sodium: 139 mmol/L (ref 135–145)

## 2024-05-09 LAB — CBC WITH DIFFERENTIAL/PLATELET
Abs Immature Granulocytes: 0.04 10*3/uL (ref 0.00–0.07)
Basophils Absolute: 0 10*3/uL (ref 0.0–0.1)
Basophils Relative: 0 %
Eosinophils Absolute: 0 10*3/uL (ref 0.0–0.5)
Eosinophils Relative: 0 %
HCT: 33.3 % — ABNORMAL LOW (ref 39.0–52.0)
Hemoglobin: 11 g/dL — ABNORMAL LOW (ref 13.0–17.0)
Immature Granulocytes: 0 %
Lymphocytes Relative: 7 %
Lymphs Abs: 0.6 10*3/uL — ABNORMAL LOW (ref 0.7–4.0)
MCH: 31.8 pg (ref 26.0–34.0)
MCHC: 33 g/dL (ref 30.0–36.0)
MCV: 96.2 fL (ref 80.0–100.0)
Monocytes Absolute: 0.6 10*3/uL (ref 0.1–1.0)
Monocytes Relative: 7 %
Neutro Abs: 7.6 10*3/uL (ref 1.7–7.7)
Neutrophils Relative %: 86 %
Platelets: 271 10*3/uL (ref 150–400)
RBC: 3.46 MIL/uL — ABNORMAL LOW (ref 4.22–5.81)
RDW: 16.2 % — ABNORMAL HIGH (ref 11.5–15.5)
WBC: 9 10*3/uL (ref 4.0–10.5)
nRBC: 0 % (ref 0.0–0.2)

## 2024-05-09 LAB — I-STAT CHEM 8, ED
BUN: 9 mg/dL (ref 8–23)
Calcium, Ion: 1.06 mmol/L — ABNORMAL LOW (ref 1.15–1.40)
Chloride: 103 mmol/L (ref 98–111)
Creatinine, Ser: 0.7 mg/dL (ref 0.61–1.24)
Glucose, Bld: 103 mg/dL — ABNORMAL HIGH (ref 70–99)
HCT: 33 % — ABNORMAL LOW (ref 39.0–52.0)
Hemoglobin: 11.2 g/dL — ABNORMAL LOW (ref 13.0–17.0)
Potassium: 3.6 mmol/L (ref 3.5–5.1)
Sodium: 138 mmol/L (ref 135–145)
TCO2: 22 mmol/L (ref 22–32)

## 2024-05-09 MED ORDER — ACETAMINOPHEN 500 MG PO TABS
1000.0000 mg | ORAL_TABLET | Freq: Once | ORAL | Status: AC
  Administered 2024-05-09: 1000 mg via ORAL
  Filled 2024-05-09: qty 2

## 2024-05-09 MED ORDER — OXYCODONE HCL 5 MG PO TABS
5.0000 mg | ORAL_TABLET | Freq: Once | ORAL | Status: AC
  Administered 2024-05-09: 5 mg via ORAL
  Filled 2024-05-09: qty 1

## 2024-05-09 MED ORDER — IPRATROPIUM-ALBUTEROL 0.5-2.5 (3) MG/3ML IN SOLN
3.0000 mL | Freq: Once | RESPIRATORY_TRACT | Status: AC
  Administered 2024-05-09: 3 mL via RESPIRATORY_TRACT
  Filled 2024-05-09: qty 3

## 2024-05-09 MED ORDER — MORPHINE SULFATE 15 MG PO TABS: 7.5000 mg | ORAL_TABLET | ORAL | 0 refills | Status: DC | PRN

## 2024-05-09 NOTE — ED Notes (Signed)
 Patient transported to X-ray

## 2024-05-09 NOTE — ED Provider Notes (Signed)
 [place] [facility] AT [facility] Provider Note   CSN: [id number] Arrival date & time: 05/09/24  9066     Patient presents with: [place]   Thomas Ferrell is a 83 y.o. male.   83 yo M with a cc of a fall.  Patient fell out of bed.  Has fallen twice.  Pain mostly to the right elbow and R chest wall pain.  Some reportedly difficulty breathing.     Fall       Prior to Admission medications   Medication Sig Start Date End Date Taking? Authorizing Provider  morphine  (MSIR) 15 MG tablet Take 0.5 tablets (7.5 mg total) by mouth every 4 (four) hours as needed. 05/09/24  Yes Emil Share, DO  acetaminophen  (TYLENOL ) 650 MG CR tablet Take 650 mg by mouth 2 (two) times daily.    [provider]  apixaban  (ELIQUIS ) 2.5 MG TABS tablet Take 1 tablet (2.5 mg total) by mouth 2 (two) times daily. 04/07/24   Terra Fairy PARAS, PA-C  apixaban  (ELIQUIS ) 2.5 MG TABS tablet Take 1 tablet (2.5 mg total) by mouth 2 (two) times daily. 04/27/24   Trudy Birmingham, PA-C  aspirin  81 MG chewable tablet 1 tablet Orally Once a day    [provider]  atorvastatin  (LIPITOR) 20 MG tablet Take 20 mg by mouth in the morning. 07/17/22   [provider]  bismuth subsalicylate (PEPTO BISMOL) 262 MG chewable tablet Chew 262 mg by mouth as needed.    [provider]  Dextromethorphan  HBr (COUGH SUPPRESSANT PO) Take 1 tablet by mouth as needed. [employer]    [provider]  feeding supplement (ENSURE ENLIVE / ENSURE PLUS) LIQD Take 237 mLs by mouth 2 (two) times daily between meals. Patient not taking: Reported on 04/27/2024 03/20/24   Sebastian Toribio GAILS, MD  fluticasone  (FLONASE ) 50 MCG/ACT nasal spray Place 2 sprays into both nostrils daily. Patient taking differently: Place 2 sprays into both nostrils as needed. 03/20/24   Sebastian Toribio GAILS, MD  irbesartan  (AVAPRO ) 150 MG tablet Take 1 tablet (150 mg total) by mouth daily. In addition to 75 mg daily to equal 225 mg  daily 04/07/24 04/07/25  Terra Fairy PARAS, PA-C  irbesartan  (AVAPRO ) 75 MG tablet Take 1 tablet (75 mg total) by mouth daily. In addition to 150 mg daily to equal 225 mg daily 04/07/24 04/07/25  Terra Fairy PARAS, PA-C  ketotifen (ZADITOR) 0.035 % ophthalmic solution Place 1 drop into both eyes daily as needed (Dry eye).    [provider]  levalbuterol  (XOPENEX  HFA) 45 MCG/ACT inhaler Inhale 2 puffs into the lungs in the morning, at noon, and at bedtime for 4 days, THEN 2 puffs every 6 (six) hours as needed for wheezing. 03/19/24 03/23/25  Sebastian Toribio GAILS, MD  loratadine  (CLARITIN ) 10 MG tablet Take 1 tablet (10 mg total) by mouth daily. 03/20/24   Sebastian Toribio GAILS, MD  metoprolol  tartrate (LOPRESSOR ) 25 MG tablet Take 0.5 tablets (12.5 mg total) by mouth 2 (two) times daily. 03/19/24   Sebastian Toribio GAILS, MD  mometasone -formoterol  (DULERA ) 200-5 MCG/ACT AERO Inhale 2 puffs into the lungs 2 (two) times daily. 03/19/24   Sebastian Toribio GAILS, MD  Multiple Vitamin (MULTIVITAMIN WITH MINERALS) TABS tablet Take 1 tablet by mouth daily. 03/20/24   Sebastian Toribio GAILS, MD  pantoprazole  (PROTONIX ) 40 MG tablet Take 1 tablet (40 mg total) by mouth daily at 6 (six) AM. 03/20/24   Sebastian Toribio GAILS, MD  potassium chloride  (KLOR-CON ) 10 MEQ tablet Take 4 tablets (40 mEq total) by mouth daily. 05/05/24 08/03/24  Dunn, Dayna N, PA-C  sertraline  (ZOLOFT ) 50 MG tablet Take 50 mg by mouth in the morning.    [provider]  spironolactone (ALDACTONE) 25 MG tablet Take 1 tablet (25 mg total) by mouth daily. 04/27/24   Williams, Evan, PA-C  traMADol  (ULTRAM ) 50 MG tablet Take 1 tablet (50 mg total) by mouth every 6 (six) hours as needed for severe pain (pain score 7-10). Patient not taking: Reported on 04/27/2024 04/10/24   Baglia, Corrina, PA-C  vitamin B-12 (CYANOCOBALAMIN ) 500 MCG tablet Take 500 mcg by mouth in the morning.    [provider]    Allergies: Hydrocodone  and Morphine  and codeine    Review  of Systems  Updated Vital Signs BP (!) 168/70 (BP Location: Left Arm)   Pulse 97   Temp 98.2 F (36.8 C) (Oral)   Resp 19   Ht 5' 4 (1.626 m)   Wt 56.7 kg   SpO2 92%   BMI 21.46 kg/m   Physical Exam Vitals and nursing note reviewed.  Constitutional:      Appearance: He is well-developed.  HENT:     Head: Normocephalic and atraumatic.   Eyes:     Pupils: Pupils are equal, round, and reactive to light.   Neck:     Vascular: No JVD.   Cardiovascular:     Rate and Rhythm: Normal rate and regular rhythm.     Heart sounds: No murmur heard.    No friction rub. No gallop.  Pulmonary:     Effort: No respiratory distress.     Breath sounds: No wheezing.  Abdominal:     General: There is no distension.     Tenderness: There is no abdominal tenderness. There is no guarding or rebound.   Musculoskeletal:        General: Normal range of motion.     Cervical back: Normal range of motion and neck supple.     Comments: Bruising noted to bilateral elbows right worse than left.  Able to supinate and pronate without issue.  No obvious bony tenderness.  Pulse motor and sensation intact distally bilaterally.  Patient does have some right chest wall tenderness.  No obvious bruising to the chest wall.  No obvious abdominal discomfort.  No midline spinal tenderness step-offs or deformities.   Skin:    Coloration: Skin is not pale.     Findings: No rash.   Neurological:     Mental Status: He is alert and oriented to person, place, and time.   Psychiatric:        Behavior: Behavior normal.     (all labs ordered are listed, but only abnormal results are displayed) Labs Reviewed  CBC WITH DIFFERENTIAL/PLATELET - Abnormal; Notable for the following components:      Result Value   RBC 3.46 (*)    Hemoglobin 11.0 (*)    HCT 33.3 (*)    RDW 16.2 (*)    Lymphs Abs 0.6 (*)    All other components within normal limits  BASIC METABOLIC PANEL WITH GFR - Abnormal; Notable for the  following components:   CO2 20 (*)    Glucose, Bld 106 (*)    Calcium  8.4 (*)    Anion gap 16 (*)    All other components within normal limits  I-STAT CHEM 8, ED - Abnormal; Notable for the following components:   Glucose, Bld  103 (*)    Calcium , Ion 1.06 (*)    Hemoglobin 11.2 (*)    HCT 33.0 (*)    All other components within normal limits    EKG: EKG Interpretation Date/Time:  Saturday May 09 2024 09:43:29 EDT Ventricular Rate:  95 PR Interval:  124 QRS Duration:  72 QT Interval:  390 QTC Calculation: 490 R Axis:   66  Text Interpretation: Sinus rhythm with Premature atrial complexes Nonspecific ST abnormality Prolonged QT Abnormal ECG Otherwise no significant change Confirmed by Emil Share [phone]) on 05/09/2024 11:10:45 AM  Radiology: [facility] Chest Port 1 View Result Date: 05/09/2024 CLINICAL DATA:  right rib pain EXAM: PORTABLE CHEST - 1 VIEW COMPARISON:  03/17/2024 FINDINGS: Chronic coarse interstitial markings particularly in the lung bases, left greater than right. No pneumothorax. Heart size and mediastinal contours are within normal limits. Aortic Atherosclerosis (ICD10-170.0). No effusion.  Electronic device overlies the left mid lung. Bilateral shoulder DJD. IMPRESSION: Chronic interstitial markings. No acute findings. Electronically Signed   By: JONETTA Faes M.D.   On: 05/09/2024 12:00   DG Forearm Right Result Date: 05/09/2024 CLINICAL DATA:  fall, pain in both arms EXAM: RIGHT FOREARM - 2 VIEW COMPARISON:  None Available. FINDINGS: Osteopenia.No acute fracture or dislocation. There is no evidence of arthropathy or other focal bone abnormality. Peripheral vascular atherosclerosis. Moderate soft tissue swelling and edema along the posterolateral aspect of the proximal forearm. IMPRESSION: Moderate soft tissue swelling and edema about the posterolateral aspect of the proximal forearm. No acute fracture or dislocation. Electronically Signed   By: Rogelia Myers M.D.   On:  05/09/2024 11:49   DG Elbow 2 Views Right Addendum Date: 05/09/2024 ADDENDUM REPORT: 05/09/2024 11:48 ADDENDUM: The region of proximal forearm swelling was incorrectly dictated. The swelling is actually in the posterolateral aspect of the proximal forearm. Electronically Signed   By: Rogelia Myers M.D.   On: 05/09/2024 11:48   Result Date: 05/09/2024 CLINICAL DATA:  809823 Fall 190176, pain in both arms EXAM: RIGHT ELBOW - 2 VIEW COMPARISON:  None Available. FINDINGS: Osteopenia.No acute fracture or dislocation. No joint effusion. There is no evidence of arthropathy or other focal bone abnormality. Peripheral vascular atherosclerosis. Moderate soft tissue swelling and edema along the posteromedial aspect of the proximal forearm. IMPRESSION: Moderate soft tissue swelling and edema along the posteromedial aspect of the proximal forearm. No acute fracture or dislocation. Electronically Signed: By: Rogelia Myers M.D. On: 05/09/2024 11:44   DG Elbow 2 Views Left Result Date: 05/09/2024 CLINICAL DATA:  [id number] Fall 190176 EXAM: LEFT ELBOW - 2 VIEW COMPARISON:  None Available. FINDINGS: Osteopenia.No acute fracture or dislocation. No joint effusion. There is no evidence of arthropathy or other focal bone abnormality. Mild soft tissue swelling posteriorly about the olecranon. Peripheral vascular atherosclerosis. IMPRESSION: Mild soft tissue swelling posteriorly about the olecranon. No acute fracture or dislocation. Electronically Signed   By: Rogelia Myers M.D.   On: 05/09/2024 11:45   DG Forearm Left Result Date: 05/09/2024 CLINICAL DATA:  fall, pain in both arms EXAM: LEFT FOREARM - 2 VIEW COMPARISON:  None Available. FINDINGS: Osteopenia.No acute fracture or dislocation. There is no evidence of arthropathy or other focal bone abnormality. Peripheral vascular atherosclerosis. IMPRESSION: No acute fracture or dislocation. Electronically Signed   By: Rogelia Myers M.D.   On: 05/09/2024 11:42   DG Humerus  Right Result Date: 05/09/2024 CLINICAL DATA:  fall, pain in both arms EXAM: RIGHT HUMERUS - 2+ VIEW COMPARISON:  None Available. FINDINGS:  Osteopenia.No acute fracture or dislocation. Superior subluxation of the humeral head with respect to the glenoid, suggesting chronic rotator cuff tear. Peripheral vascular atherosclerosis. IMPRESSION: No acute fracture or dislocation. Electronically Signed   By: Rogelia Myers M.D.   On: 05/09/2024 11:41   DG Humerus Left Result Date: 05/09/2024 CLINICAL DATA:  fall, pain in both arms EXAM: LEFT HUMERUS - 2+ VIEW COMPARISON:  None Available. FINDINGS: Osteopenia.No acute fracture or dislocation. Apparent superior subluxation of the humeral head with respect to the glenohumeral joint, possibly reflecting an underlying chronic rotator cuff tear. Peripheral vascular atherosclerosis. IMPRESSION: No acute fracture or dislocation. Electronically Signed   By: Rogelia Myers M.D.   On: 05/09/2024 11:40     Procedures   Medications Ordered in the ED  ipratropium-albuterol  (DUONEB) 0.5-2.5 (3) MG/3ML nebulizer solution 3 mL (3 mLs Nebulization Given 05/09/24 1155)  acetaminophen  (TYLENOL ) tablet 1,000 mg (1,000 mg Oral Given 05/09/24 1153)  oxyCODONE  (Oxy IR/ROXICODONE ) immediate release tablet 5 mg (5 mg Oral Given 05/09/24 1153)                                    Medical Decision Making Amount and/or Complexity of Data Reviewed Labs: ordered. Radiology: ordered.  Risk OTC drugs. Prescription drug management.   83 yo M with a chief complaint of a fall out of bed.  This is actually the patient's 2nd or 3rd fall in the past couple weeks.  Complaining mostly of right elbow pain.  He is also having some right sided chest pain.  I discussed CT imaging which she is currently declining.  Plain film of the chest independently interpreted by me without obvious displaced fracture or pneumothorax.  Plain film of the right elbow independently interpreted by me without  fracture or dislocation.  Plain film of the left elbow independently interpreted by me without fracture or dislocation.  I discussed results with patient and family.  I did obtain basic blood work with his frequent falls, no obvious acute anemia, no significant electrolyte abnormalities.  3:13 PM:  I have discussed the diagnosis/risks/treatment options with the patient.  Evaluation and diagnostic testing in the emergency department does not suggest an emergent condition requiring admission or immediate intervention beyond what has been performed at this time.  They will follow up with PCP. We also discussed returning to the ED immediately if new or worsening sx occur. We discussed the sx which are most concerning (e.g., sudden worsening pain, fever, inability to tolerate by mouth) that necessitate immediate return. Medications administered to the patient during their visit and any new prescriptions provided to the patient are listed below.  Medications given during this visit Medications  ipratropium-albuterol  (DUONEB) 0.5-2.5 (3) MG/3ML nebulizer solution 3 mL (3 mLs Nebulization Given 05/09/24 1155)  acetaminophen  (TYLENOL ) tablet 1,000 mg (1,000 mg Oral Given 05/09/24 1153)  oxyCODONE  (Oxy IR/ROXICODONE ) immediate release tablet 5 mg (5 mg Oral Given 05/09/24 1153)     The patient appears reasonably screen and/or stabilized for discharge and I doubt any other medical condition or other [facility] requiring further screening, evaluation, or treatment in the ED at this time prior to discharge.       Final diagnoses:  Fall, initial encounter  Hematoma    ED Discharge Orders          Ordered    morphine  (MSIR) 15 MG tablet  Every 4 hours PRN  05/09/24 1308               Emil Share, DO 05/09/24 1513

## 2024-05-09 NOTE — ED Triage Notes (Signed)
 Pt fell out of bed onto hardwood last night, landed on both arms and legs, denies hitting head. Pt did not lose consciousness. Pt c/o pain in bilateral arms. Pt does take eliquis . Pt has swelling in arms.

## 2024-05-09 NOTE — ED Notes (Signed)
 Pt stated he had the urge to urinate, pt was helped standing beside the bed with a urinal but was unable to urinate. Pt stated he has a history of bladder cancer and it is difficult to urinate.

## 2024-05-09 NOTE — Telephone Encounter (Signed)
 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 No throat tightness, edema, anaphylaxis. Spoke with Dr Emil, , told wife to take benedryl 12.5 to 25 mg PRN itching directions on label. He also gets this with hydrocodone ,  has not taken anything else for pain, but seems like a general pattern with narcotics. Wife verbalizing understanding. Return precautions reiterated

## 2024-05-09 NOTE — Discharge Instructions (Signed)
 There no obvious broken bones on your images here.  Please follow-up with your family doctor in the office.  Please return for worsening difficulty breathing or worsening chest discomfort or if you are unable to take a deep breath.  Please use the incentive spirometer 10 minutes out of every hour that you are awake.  Please let your family doctor know about your visit.

## 2024-05-11 ENCOUNTER — Telehealth: Payer: Self-pay | Admitting: Cardiology

## 2024-05-11 DIAGNOSIS — E876 Hypokalemia: Secondary | ICD-10-CM | POA: Diagnosis not present

## 2024-05-11 NOTE — Telephone Encounter (Signed)
 Patient walked in stating they did not know which Potassium medication to take due to them getting one from PCP and [facility]. Informed triage and let patient know someone would reach out.

## 2024-05-11 NOTE — Telephone Encounter (Signed)
 Trudy Birmingham, PA-C to Me  (Selected Message)     05/11/24  1:10 PM Please instruct patient to take potassium prescribed by our office (if he has not already). I am not sure of PCP dose, can't see their notes. If he has not been taking potassium, will need a BMET in a week.    [place] Trudy, PA-C    Called the pt and wife. They were wanting to confirm the pts KDUR dose he is supposed to be taking, given the pt has 2 different doses on hand, one from our office and one from his PCP.   Wife states that the pt is taking what we last prescribed of KDUR 10 mEq --take 4 tablets (40 mEq total) by mouth daily.  Wife states the PCP had the pt previously on KDUR 20 mEq po daily, and they wanted confirmation that the pt was indeed suppose to be on the 40 mEq po daily vs the 20 mEq po daily.  Informed both parties that indeed the pt should be taking KDUR 40 mEq po daily, as advised from us  at last BMET result on 6/23 and BMET done in the hospital on 6/28.   Advised the pts wife to have him continue KDUR 40 mEq po daily, given last BMET result on 6/28 indicated a normal K level of 3.6.  Wife verbalized understanding and agrees with this plan. Wife states she will have the pt continue with our prescribing dose of KDUR 40 mEq po daily.  Informed the pts wife that I will route a copy of these lab results to the pts PCP on file, for his reference.  Wife was very appreciative for this.

## 2024-05-11 NOTE — Telephone Encounter (Signed)
  Trudy Birmingham, PA-C Cardiology   All Conversations (Newest Message First)            Me    05/11/24  1:30 PM Note Trudy Birmingham, PA-C to Me  (Selected Message)      05/11/24  1:10 PM Please instruct patient to take potassium prescribed by our office (if he has not already). I am not sure of PCP dose, can't see their notes. If he has not been taking potassium, will need a BMET in a week.    [place] Trudy, PA-C      Called the pt and wife. They were wanting to confirm the pts KDUR dose he is supposed to be taking, given the pt has 2 different doses on hand, one from our office and one from his PCP.    Wife states that the pt is taking what we last prescribed of KDUR 10 mEq --take 4 tablets (40 mEq total) by mouth daily.  Wife states the PCP had the pt previously on KDUR 20 mEq po daily, and they wanted confirmation that the pt was indeed suppose to be on the 40 mEq po daily vs the 20 mEq po daily.   Informed both parties that indeed the pt should be taking KDUR 40 mEq po daily, as advised from us  at last BMET result on 6/23 and BMET done in the hospital on 6/28.   Advised the pts wife to have him continue KDUR 40 mEq po daily, given last BMET result on 6/28 indicated a normal K level of 3.6.   Wife verbalized understanding and agrees with this plan. Wife states she will have the pt continue with our prescribing dose of KDUR 40 mEq po daily.   Informed the pts wife that I will route a copy of these lab results to the pts PCP on file, for his reference.  Wife was very appreciative for this.      Trudy Birmingham, PA-C to Me      05/11/24  1:10 PM Please instruct patient to take potassium prescribed by our office (if he has not already). I am not sure of PCP dose, can't see their notes. If he has not been taking potassium, will need a BMET in a week.    [place] Trudy, PA-C     05/11/24 11:01 AM Thomas Ferrell I routed this conversation to Trudy Birmingham, PA-C   [name], [name] JO    05/11/24 11:01 AM Note Patient walked in stating they did not know which Potassium medication to take due to them getting one from PCP and [facility] Bring. Informed triage and let patient know someone would reach out.

## 2024-05-12 LAB — BASIC METABOLIC PANEL WITH GFR
BUN/Creatinine Ratio: 18 (ref 10–24)
BUN: 13 mg/dL (ref 8–27)
CO2: 18 mmol/L — ABNORMAL LOW (ref 20–29)
Calcium: 8.2 mg/dL — ABNORMAL LOW (ref 8.6–10.2)
Chloride: 104 mmol/L (ref 96–106)
Creatinine, Ser: 0.73 mg/dL — ABNORMAL LOW (ref 0.76–1.27)
Glucose: 108 mg/dL — ABNORMAL HIGH (ref 70–99)
Potassium: 5.1 mmol/L (ref 3.5–5.2)
Sodium: 139 mmol/L (ref 134–144)
eGFR: 91 mL/min/{1.73_m2} (ref >59)

## 2024-05-13 ENCOUNTER — Telehealth: Payer: Self-pay | Admitting: *Deleted

## 2024-05-13 DIAGNOSIS — Z79899 Other long term (current) drug therapy: Secondary | ICD-10-CM

## 2024-05-13 DIAGNOSIS — E876 Hypokalemia: Secondary | ICD-10-CM

## 2024-05-13 NOTE — Telephone Encounter (Signed)
 Secure chat sent from Artist Pouch PA-C to recheck this pts BMET in one week, just to make sure his K level is maintaining on KDUR 40 mEq po daily.  Called the pts wife Thomas Ferrell ([place] and handles all medical needs for the pt) and informed her that per Artist Pouch PA-C, he would like for the pt to come into the office next Wed/Thurs, to have another BMET done for surveillance of his K level.   One week BMET placed in the system and released.   Wife verbalized understanding and agrees with this plan.

## 2024-05-17 DIAGNOSIS — I10 Essential (primary) hypertension: Secondary | ICD-10-CM | POA: Diagnosis not present

## 2024-05-17 DIAGNOSIS — T07XXXA Unspecified multiple injuries, initial encounter: Secondary | ICD-10-CM | POA: Diagnosis not present

## 2024-05-17 DIAGNOSIS — W19XXXA Unspecified fall, initial encounter: Secondary | ICD-10-CM | POA: Diagnosis not present

## 2024-05-17 DIAGNOSIS — R531 Weakness: Secondary | ICD-10-CM | POA: Diagnosis not present

## 2024-05-18 ENCOUNTER — Emergency Department (HOSPITAL_COMMUNITY)

## 2024-05-18 ENCOUNTER — Observation Stay (HOSPITAL_COMMUNITY)

## 2024-05-18 ENCOUNTER — Inpatient Hospital Stay (HOSPITAL_COMMUNITY)
Admission: EM | Admit: 2024-05-18 | Discharge: 2024-05-26 | DRG: 177 | Disposition: A | Discharge status: Skilled Nursing Facility | Attending: Family Medicine | Admitting: Family Medicine

## 2024-05-18 ENCOUNTER — Encounter (HOSPITAL_COMMUNITY): Payer: Self-pay | Admitting: Emergency Medicine

## 2024-05-18 ENCOUNTER — Other Ambulatory Visit: Payer: Self-pay

## 2024-05-18 DIAGNOSIS — Z8673 Personal history of transient ischemic attack (TIA), and cerebral infarction without residual deficits: Secondary | ICD-10-CM

## 2024-05-18 DIAGNOSIS — W19XXXA Unspecified fall, initial encounter: Secondary | ICD-10-CM | POA: Diagnosis not present

## 2024-05-18 DIAGNOSIS — J9621 Acute and chronic respiratory failure with hypoxia: Secondary | ICD-10-CM | POA: Diagnosis not present

## 2024-05-18 DIAGNOSIS — J941 Fibrothorax: Secondary | ICD-10-CM | POA: Diagnosis not present

## 2024-05-18 DIAGNOSIS — R627 Adult failure to thrive: Secondary | ICD-10-CM | POA: Diagnosis present

## 2024-05-18 DIAGNOSIS — E8770 Fluid overload, unspecified: Secondary | ICD-10-CM | POA: Diagnosis not present

## 2024-05-18 DIAGNOSIS — Z79899 Other long term (current) drug therapy: Secondary | ICD-10-CM

## 2024-05-18 DIAGNOSIS — R296 Repeated falls: Secondary | ICD-10-CM | POA: Diagnosis present

## 2024-05-18 DIAGNOSIS — Z7901 Long term (current) use of anticoagulants: Secondary | ICD-10-CM

## 2024-05-18 DIAGNOSIS — K224 Dyskinesia of esophagus: Secondary | ICD-10-CM | POA: Diagnosis present

## 2024-05-18 DIAGNOSIS — L03114 Cellulitis of left upper limb: Principal | ICD-10-CM | POA: Diagnosis present

## 2024-05-18 DIAGNOSIS — S0990XA Unspecified injury of head, initial encounter: Secondary | ICD-10-CM | POA: Diagnosis not present

## 2024-05-18 DIAGNOSIS — R9389 Abnormal findings on diagnostic imaging of other specified body structures: Secondary | ICD-10-CM | POA: Diagnosis not present

## 2024-05-18 DIAGNOSIS — R918 Other nonspecific abnormal finding of lung field: Secondary | ICD-10-CM | POA: Diagnosis not present

## 2024-05-18 DIAGNOSIS — L8990 Pressure ulcer of unspecified site, unspecified stage: Secondary | ICD-10-CM | POA: Insufficient documentation

## 2024-05-18 DIAGNOSIS — Z7951 Long term (current) use of inhaled steroids: Secondary | ICD-10-CM

## 2024-05-18 DIAGNOSIS — E785 Hyperlipidemia, unspecified: Secondary | ICD-10-CM | POA: Diagnosis present

## 2024-05-18 DIAGNOSIS — Z7982 Long term (current) use of aspirin: Secondary | ICD-10-CM

## 2024-05-18 DIAGNOSIS — I480 Paroxysmal atrial fibrillation: Secondary | ICD-10-CM | POA: Diagnosis present

## 2024-05-18 DIAGNOSIS — R1310 Dysphagia, unspecified: Secondary | ICD-10-CM | POA: Diagnosis present

## 2024-05-18 DIAGNOSIS — I739 Peripheral vascular disease, unspecified: Secondary | ICD-10-CM | POA: Diagnosis present

## 2024-05-18 DIAGNOSIS — Z87891 Personal history of nicotine dependence: Secondary | ICD-10-CM

## 2024-05-18 DIAGNOSIS — E876 Hypokalemia: Secondary | ICD-10-CM | POA: Diagnosis present

## 2024-05-18 DIAGNOSIS — J440 Chronic obstructive pulmonary disease with acute lower respiratory infection: Secondary | ICD-10-CM | POA: Diagnosis present

## 2024-05-18 DIAGNOSIS — Z823 Family history of stroke: Secondary | ICD-10-CM

## 2024-05-18 DIAGNOSIS — J984 Other disorders of lung: Secondary | ICD-10-CM | POA: Diagnosis not present

## 2024-05-18 DIAGNOSIS — J690 Pneumonitis due to inhalation of food and vomit: Principal | ICD-10-CM | POA: Diagnosis present

## 2024-05-18 DIAGNOSIS — R1319 Other dysphagia: Secondary | ICD-10-CM

## 2024-05-18 DIAGNOSIS — J8410 Pulmonary fibrosis, unspecified: Secondary | ICD-10-CM | POA: Diagnosis not present

## 2024-05-18 DIAGNOSIS — L89150 Pressure ulcer of sacral region, unstageable: Secondary | ICD-10-CM | POA: Diagnosis present

## 2024-05-18 DIAGNOSIS — Z66 Do not resuscitate: Secondary | ICD-10-CM | POA: Diagnosis not present

## 2024-05-18 DIAGNOSIS — Z9981 Dependence on supplemental oxygen: Secondary | ICD-10-CM

## 2024-05-18 DIAGNOSIS — Z515 Encounter for palliative care: Secondary | ICD-10-CM

## 2024-05-18 DIAGNOSIS — W06XXXA Fall from bed, initial encounter: Secondary | ICD-10-CM | POA: Diagnosis present

## 2024-05-18 DIAGNOSIS — M79621 Pain in right upper arm: Secondary | ICD-10-CM | POA: Diagnosis not present

## 2024-05-18 DIAGNOSIS — I6523 Occlusion and stenosis of bilateral carotid arteries: Secondary | ICD-10-CM | POA: Diagnosis not present

## 2024-05-18 DIAGNOSIS — J9601 Acute respiratory failure with hypoxia: Secondary | ICD-10-CM

## 2024-05-18 DIAGNOSIS — I6381 Other cerebral infarction due to occlusion or stenosis of small artery: Secondary | ICD-10-CM | POA: Diagnosis not present

## 2024-05-18 DIAGNOSIS — D539 Nutritional anemia, unspecified: Secondary | ICD-10-CM | POA: Diagnosis present

## 2024-05-18 DIAGNOSIS — S5011XA Contusion of right forearm, initial encounter: Secondary | ICD-10-CM | POA: Diagnosis present

## 2024-05-18 DIAGNOSIS — I10 Essential (primary) hypertension: Secondary | ICD-10-CM | POA: Diagnosis present

## 2024-05-18 DIAGNOSIS — I700 Atherosclerosis of aorta: Secondary | ICD-10-CM | POA: Diagnosis not present

## 2024-05-18 DIAGNOSIS — Z885 Allergy status to narcotic agent status: Secondary | ICD-10-CM

## 2024-05-18 DIAGNOSIS — Z681 Body mass index (BMI) 19 or less, adult: Secondary | ICD-10-CM

## 2024-05-18 DIAGNOSIS — J189 Pneumonia, unspecified organism: Secondary | ICD-10-CM | POA: Diagnosis not present

## 2024-05-18 DIAGNOSIS — R609 Edema, unspecified: Secondary | ICD-10-CM | POA: Diagnosis not present

## 2024-05-18 DIAGNOSIS — M19031 Primary osteoarthritis, right wrist: Secondary | ICD-10-CM | POA: Diagnosis not present

## 2024-05-18 DIAGNOSIS — D509 Iron deficiency anemia, unspecified: Secondary | ICD-10-CM | POA: Diagnosis present

## 2024-05-18 DIAGNOSIS — Z8551 Personal history of malignant neoplasm of bladder: Secondary | ICD-10-CM

## 2024-05-18 DIAGNOSIS — R001 Bradycardia, unspecified: Secondary | ICD-10-CM | POA: Diagnosis not present

## 2024-05-18 DIAGNOSIS — G9341 Metabolic encephalopathy: Secondary | ICD-10-CM | POA: Diagnosis present

## 2024-05-18 DIAGNOSIS — E440 Moderate protein-calorie malnutrition: Secondary | ICD-10-CM | POA: Diagnosis present

## 2024-05-18 DIAGNOSIS — G5792 Unspecified mononeuropathy of left lower limb: Secondary | ICD-10-CM | POA: Diagnosis present

## 2024-05-18 LAB — CBC
HCT: 27.2 % — ABNORMAL LOW (ref 39.0–52.0)
Hemoglobin: 8.4 g/dL — ABNORMAL LOW (ref 13.0–17.0)
MCH: 32.2 pg (ref 26.0–34.0)
MCHC: 30.9 g/dL (ref 30.0–36.0)
MCV: 104.2 fL — ABNORMAL HIGH (ref 80.0–100.0)
Platelets: 220 K/uL (ref 150–400)
RBC: 2.61 MIL/uL — ABNORMAL LOW (ref 4.22–5.81)
RDW: 17.3 % — ABNORMAL HIGH (ref 11.5–15.5)
WBC: 13.7 K/uL — ABNORMAL HIGH (ref 4.0–10.5)
nRBC: 0.1 % (ref 0.0–0.2)

## 2024-05-18 LAB — I-STAT CHEM 8, ED
BUN: 15 mg/dL (ref 8–23)
Calcium, Ion: 1.05 mmol/L — ABNORMAL LOW (ref 1.15–1.40)
Chloride: 103 mmol/L (ref 98–111)
Creatinine, Ser: 0.9 mg/dL (ref 0.61–1.24)
Glucose, Bld: 101 mg/dL — ABNORMAL HIGH (ref 70–99)
HCT: 28 % — ABNORMAL LOW (ref 39.0–52.0)
Hemoglobin: 9.5 g/dL — ABNORMAL LOW (ref 13.0–17.0)
Potassium: 4.3 mmol/L (ref 3.5–5.1)
Sodium: 136 mmol/L (ref 135–145)
TCO2: 19 mmol/L — ABNORMAL LOW (ref 22–32)

## 2024-05-18 LAB — I-STAT VENOUS BLOOD GAS, ED
Acid-base deficit: 6 mmol/L — ABNORMAL HIGH (ref 0.0–2.0)
Bicarbonate: 19.8 mmol/L — ABNORMAL LOW (ref 20.0–28.0)
Calcium, Ion: 1.04 mmol/L — ABNORMAL LOW (ref 1.15–1.40)
HCT: 26 % — ABNORMAL LOW (ref 39.0–52.0)
Hemoglobin: 8.8 g/dL — ABNORMAL LOW (ref 13.0–17.0)
O2 Saturation: 32 %
Potassium: 4.3 mmol/L (ref 3.5–5.1)
Sodium: 136 mmol/L (ref 135–145)
TCO2: 21 mmol/L — ABNORMAL LOW (ref 22–32)
pCO2, Ven: 40.6 mmHg — ABNORMAL LOW (ref 44–60)
pH, Ven: 7.297 (ref 7.25–7.43)
pO2, Ven: 22 mmHg — CL (ref 32–45)

## 2024-05-18 LAB — CBC WITH DIFFERENTIAL/PLATELET
Abs Immature Granulocytes: 0.21 K/uL — ABNORMAL HIGH (ref 0.00–0.07)
Basophils Absolute: 0.1 K/uL (ref 0.0–0.1)
Basophils Relative: 0 %
Eosinophils Absolute: 0.1 K/uL (ref 0.0–0.5)
Eosinophils Relative: 1 %
HCT: 29.2 % — ABNORMAL LOW (ref 39.0–52.0)
Hemoglobin: 9.3 g/dL — ABNORMAL LOW (ref 13.0–17.0)
Immature Granulocytes: 1 %
Lymphocytes Relative: 8 %
Lymphs Abs: 1.9 K/uL (ref 0.7–4.0)
MCH: 33.2 pg (ref 26.0–34.0)
MCHC: 31.8 g/dL (ref 30.0–36.0)
MCV: 104.3 fL — ABNORMAL HIGH (ref 80.0–100.0)
Monocytes Absolute: 1.2 K/uL — ABNORMAL HIGH (ref 0.1–1.0)
Monocytes Relative: 5 %
Neutro Abs: 19.3 K/uL — ABNORMAL HIGH (ref 1.7–7.7)
Neutrophils Relative %: 85 %
Platelets: 357 K/uL (ref 150–400)
RBC: 2.8 MIL/uL — ABNORMAL LOW (ref 4.22–5.81)
RDW: 17.3 % — ABNORMAL HIGH (ref 11.5–15.5)
WBC: 22.9 K/uL — ABNORMAL HIGH (ref 4.0–10.5)
nRBC: 0 % (ref 0.0–0.2)

## 2024-05-18 LAB — CREATININE, SERUM
Creatinine, Ser: 0.86 mg/dL (ref 0.61–1.24)
GFR, Estimated: >60 mL/min (ref >60)

## 2024-05-18 LAB — URINALYSIS, W/ REFLEX TO CULTURE (INFECTION SUSPECTED)
Bacteria, UA: NONE SEEN
Bilirubin Urine: NEGATIVE
Glucose, UA: NEGATIVE mg/dL
Hgb urine dipstick: NEGATIVE
Ketones, ur: NEGATIVE mg/dL
Nitrite: NEGATIVE
Protein, ur: 30 mg/dL — AB
Specific Gravity, Urine: 1.02 (ref 1.005–1.030)
pH: 5 (ref 5.0–8.0)

## 2024-05-18 LAB — PROTIME-INR
INR: 1.5 — ABNORMAL HIGH (ref 0.8–1.2)
Prothrombin Time: 19 s — ABNORMAL HIGH (ref 11.4–15.2)

## 2024-05-18 LAB — COMPREHENSIVE METABOLIC PANEL WITH GFR
ALT: 25 U/L (ref 0–44)
AST: 35 U/L (ref 15–41)
Albumin: 3 g/dL — ABNORMAL LOW (ref 3.5–5.0)
Alkaline Phosphatase: 72 U/L (ref 38–126)
Anion gap: 11 (ref 5–15)
BUN: 14 mg/dL (ref 8–23)
CO2: 20 mmol/L — ABNORMAL LOW (ref 22–32)
Calcium: 7.6 mg/dL — ABNORMAL LOW (ref 8.9–10.3)
Chloride: 105 mmol/L (ref 98–111)
Creatinine, Ser: 1 mg/dL (ref 0.61–1.24)
GFR, Estimated: >60 mL/min (ref >60)
Glucose, Bld: 109 mg/dL — ABNORMAL HIGH (ref 70–99)
Potassium: 4.3 mmol/L (ref 3.5–5.1)
Sodium: 136 mmol/L (ref 135–145)
Total Bilirubin: 1.2 mg/dL (ref 0.0–1.2)
Total Protein: 5.9 g/dL — ABNORMAL LOW (ref 6.5–8.1)

## 2024-05-18 LAB — PROCALCITONIN: Procalcitonin: 1.58 ng/mL

## 2024-05-18 LAB — POC OCCULT BLOOD, ED: Fecal Occult Bld: NEGATIVE

## 2024-05-18 LAB — I-STAT CG4 LACTIC ACID, ED: Lactic Acid, Venous: 1.5 mmol/L (ref 0.5–1.9)

## 2024-05-18 MED ORDER — APIXABAN 2.5 MG PO TABS
2.5000 mg | ORAL_TABLET | Freq: Two times a day (BID) | ORAL | Status: DC
  Administered 2024-05-18 – 2024-05-20 (×5): 2.5 mg via ORAL
  Filled 2024-05-18 (×5): qty 1

## 2024-05-18 MED ORDER — FLUTICASONE FUROATE-VILANTEROL 100-25 MCG/ACT IN AEPB
1.0000 | INHALATION_SPRAY | Freq: Every day | RESPIRATORY_TRACT | Status: DC
  Administered 2024-05-18 – 2024-05-26 (×8): 1 via RESPIRATORY_TRACT
  Filled 2024-05-18: qty 28

## 2024-05-18 MED ORDER — LIDOCAINE 5 % EX PTCH
1.0000 | MEDICATED_PATCH | Freq: Once | CUTANEOUS | Status: AC
  Administered 2024-05-18: 1 via TRANSDERMAL
  Filled 2024-05-18: qty 1

## 2024-05-18 MED ORDER — ENOXAPARIN SODIUM 40 MG/0.4ML IJ SOSY
40.0000 mg | PREFILLED_SYRINGE | INTRAMUSCULAR | Status: DC
  Administered 2024-05-18: 40 mg via SUBCUTANEOUS

## 2024-05-18 MED ORDER — ENSURE PLUS HIGH PROTEIN PO LIQD
237.0000 mL | Freq: Two times a day (BID) | ORAL | Status: DC
  Administered 2024-05-19 – 2024-05-25 (×7): 237 mL via ORAL

## 2024-05-18 MED ORDER — ORAL CARE MOUTH RINSE: 15.0000 mL | OROMUCOSAL | Status: DC | PRN

## 2024-05-18 MED ORDER — ACETAMINOPHEN 325 MG PO TABS
650.0000 mg | ORAL_TABLET | Freq: Four times a day (QID) | ORAL | Status: DC | PRN
  Administered 2024-05-18 – 2024-05-25 (×7): 650 mg via ORAL
  Filled 2024-05-18 (×7): qty 2

## 2024-05-18 MED ORDER — SODIUM CHLORIDE 0.9 % IV SOLN
1.0000 g | INTRAVENOUS | Status: AC
  Administered 2024-05-19 – 2024-05-22 (×4): 1 g via INTRAVENOUS
  Filled 2024-05-18 (×4): qty 10

## 2024-05-18 MED ORDER — VANCOMYCIN HCL IN DEXTROSE 1-5 GM/200ML-% IV SOLN
1000.0000 mg | Freq: Once | INTRAVENOUS | Status: AC
Start: 2024-05-18 — End: 2024-05-18
  Administered 2024-05-18: 1000 mg via INTRAVENOUS
  Filled 2024-05-18: qty 200

## 2024-05-18 MED ORDER — SODIUM CHLORIDE 0.9 % IV SOLN
2.0000 g | Freq: Once | INTRAVENOUS | Status: AC
Start: 2024-05-18 — End: 2024-05-18
  Administered 2024-05-18: 2 g via INTRAVENOUS
  Filled 2024-05-18: qty 12.5

## 2024-05-18 MED ORDER — ALBUTEROL SULFATE (2.5 MG/3ML) 0.083% IN NEBU
10.0000 mg/h | INHALATION_SOLUTION | RESPIRATORY_TRACT | Status: DC
  Administered 2024-05-18: 10 mg/h via RESPIRATORY_TRACT
  Filled 2024-05-18: qty 12
  Filled 2024-05-18: qty 3

## 2024-05-18 MED ORDER — ASPIRIN 81 MG PO CHEW
81.0000 mg | CHEWABLE_TABLET | Freq: Every day | ORAL | Status: DC
  Administered 2024-05-18: 81 mg via ORAL
  Filled 2024-05-18: qty 1

## 2024-05-18 MED ORDER — PANTOPRAZOLE SODIUM 40 MG PO TBEC
40.0000 mg | DELAYED_RELEASE_TABLET | Freq: Every day | ORAL | Status: DC
  Administered 2024-05-19 – 2024-05-26 (×8): 40 mg via ORAL
  Filled 2024-05-18 (×8): qty 1

## 2024-05-18 MED ORDER — ATORVASTATIN CALCIUM 10 MG PO TABS
20.0000 mg | ORAL_TABLET | Freq: Every morning | ORAL | Status: DC
  Administered 2024-05-18 – 2024-05-22 (×5): 20 mg via ORAL
  Filled 2024-05-18 (×5): qty 2

## 2024-05-18 MED ORDER — SERTRALINE HCL 50 MG PO TABS
75.0000 mg | ORAL_TABLET | Freq: Every day | ORAL | Status: DC
  Administered 2024-05-18 – 2024-05-26 (×9): 75 mg via ORAL
  Filled 2024-05-18 (×2): qty 2
  Filled 2024-05-18 (×2): qty 1
  Filled 2024-05-18 (×3): qty 2
  Filled 2024-05-18: qty 1
  Filled 2024-05-18: qty 2

## 2024-05-18 MED ORDER — TRAMADOL 5 MG/ML ORAL SUSPENSION: 25.0000 mg | Freq: Once | ORAL | Status: DC | PRN

## 2024-05-18 MED ORDER — SODIUM CHLORIDE 0.9 % IV BOLUS
1000.0000 mL | Freq: Once | INTRAVENOUS | Status: AC
  Administered 2024-05-18: 1000 mL via INTRAVENOUS

## 2024-05-18 MED ORDER — KETOTIFEN FUMARATE 0.035 % OP SOLN: 1.0000 [drp] | Freq: Every day | OPHTHALMIC | Status: DC | PRN

## 2024-05-18 MED ORDER — IPRATROPIUM-ALBUTEROL 0.5-2.5 (3) MG/3ML IN SOLN
3.0000 mL | Freq: Four times a day (QID) | RESPIRATORY_TRACT | Status: DC | PRN
  Administered 2024-05-18 – 2024-05-19 (×3): 3 mL via RESPIRATORY_TRACT
  Filled 2024-05-18 (×4): qty 3

## 2024-05-18 MED ORDER — IOHEXOL 350 MG/ML SOLN
75.0000 mL | Freq: Once | INTRAVENOUS | Status: AC | PRN
  Administered 2024-05-18: 75 mL via INTRAVENOUS

## 2024-05-18 MED ORDER — DIAZEPAM 5 MG/ML IJ SOLN
2.5000 mg | Freq: Once | INTRAMUSCULAR | Status: AC
  Administered 2024-05-18: 2.5 mg via INTRAVENOUS
  Filled 2024-05-18: qty 2

## 2024-05-18 MED ORDER — AZITHROMYCIN 250 MG PO TABS
500.0000 mg | ORAL_TABLET | Freq: Every day | ORAL | Status: DC
  Administered 2024-05-18: 500 mg via ORAL
  Filled 2024-05-18: qty 2

## 2024-05-18 MED ORDER — IPRATROPIUM BROMIDE 0.02 % IN SOLN
0.5000 mg | Freq: Once | RESPIRATORY_TRACT | Status: AC
  Administered 2024-05-18: 0.5 mg via RESPIRATORY_TRACT
  Filled 2024-05-18 (×2): qty 2.5

## 2024-05-18 MED ORDER — TRAMADOL HCL 50 MG PO TABS
25.0000 mg | ORAL_TABLET | Freq: Once | ORAL | Status: AC | PRN
  Administered 2024-05-18: 25 mg via ORAL
  Filled 2024-05-18: qty 1

## 2024-05-18 NOTE — ED Notes (Signed)
 Patient transported to CT

## 2024-05-18 NOTE — ED Provider Notes (Signed)
 [place] EMERGENCY DEPARTMENT AT [facility] Provider Note  CSN: [id number] Arrival date & time: 05/18/24 9981  Chief Complaint(s) Fall  HPI Thomas Ferrell is a 83 y.o. male here after rolling out of bed.  Sustained fall.  No head trauma tonight. No LOC. This is the second visit for recurrent falls.  Wife reports that the patient has been feeling generally weak and fatigued for the past several weeks.  Over the past several days the patient has been progressively worse.  Reports that he has also been hallucinating, seeing bugs crawling on the wall that are not there.   They deny any known recent fevers or infections.  They do report intermittent diarrhea.  Wife reports increased shortness of breath with exertion.  No peripheral edema.  No nausea vomiting.  Patient denies any chest pain. No shortness of breath at rest.  He is reports several days of left upper arm pain.   Fall    Past Medical History Past Medical History:  Diagnosis Date   Abnormality of gait 03/03/2013   bladder ca dx'd 11/2009   chemo/xrt comp 02/2010   Cerebrovascular disease    right brain CVA 2007   COPD (chronic obstructive pulmonary disease) (HCC)    Cough productive of clear sputum    TX RECENT SINUS INFECTION    CVA (cerebral vascular accident) (HCC) 2007   r-cva   Dyslipidemia    Hypertension    Lumbago    Patient Active Problem List   Diagnosis Date Noted   Fall 05/18/2024   Atherosclerosis of coronary artery of native heart without angina pectoris 04/27/2024   Aortic atherosclerosis (HCC) 04/27/2024   Chronic heart failure with preserved ejection fraction (HCC) 04/27/2024   Atrial fibrillation (HCC) 03/18/2024   New onset a-fib (HCC) 03/17/2024   Acute heart failure (HCC) 03/17/2024   COPD with acute exacerbation (HCC) 03/17/2024   Hypokalemia 03/17/2024   Essential hypertension 03/17/2024   HLD (hyperlipidemia) 03/17/2024   Anemia 03/17/2024   Cholelithiasis with acute  cholecystitis 06/06/2016   Abdominal pain    Common bile duct (CBD) obstruction    Pneumonia 06/02/2016   Aftercare following surgery of the circulatory system 10/21/2014   Drainage from wound 10/21/2014   Atherosclerotic PVD with ulceration (HCC) 09/01/2014   Ulcer of lower limb (HCC) 12/31/2013   PAD (peripheral artery disease) (HCC) 12/09/2013   Atherosclerosis of native arteries of the extremities with ulceration (HCC) 12/03/2013   Abnormality of gait 03/03/2013   Lumbago 03/03/2013   Home Medication(s) Prior to Admission medications   Medication Sig Start Date End Date Taking? Authorizing Provider  acetaminophen  (TYLENOL ) 650 MG CR tablet Take 650 mg by mouth 2 (two) times daily.    [provider]  apixaban  (ELIQUIS ) 2.5 MG TABS tablet Take 1 tablet (2.5 mg total) by mouth 2 (two) times daily. 04/07/24   Terra Fairy PARAS, PA-C  apixaban  (ELIQUIS ) 2.5 MG TABS tablet Take 1 tablet (2.5 mg total) by mouth 2 (two) times daily. 04/27/24   Trudy Birmingham, PA-C  aspirin  81 MG chewable tablet 1 tablet Orally Once a day    [provider]  atorvastatin  (LIPITOR) 20 MG tablet Take 20 mg by mouth in the morning. 07/17/22   [provider]  bismuth subsalicylate (PEPTO BISMOL) 262 MG chewable tablet Chew 262 mg by mouth as needed.    [provider]  Dextromethorphan  HBr (COUGH SUPPRESSANT PO) Take 1 tablet by mouth as needed. [employer]  [provider]  feeding supplement (ENSURE ENLIVE / ENSURE PLUS) LIQD Take 237 mLs by mouth 2 (two) times daily between meals. Patient not taking: Reported on 04/27/2024 03/20/24   Sebastian Toribio GAILS, MD  fluticasone  (FLONASE ) 50 MCG/ACT nasal spray Place 2 sprays into both nostrils daily. Patient taking differently: Place 2 sprays into both nostrils as needed. 03/20/24   Sebastian Toribio GAILS, MD  irbesartan  (AVAPRO ) 150 MG tablet Take 1 tablet (150 mg total) by mouth daily. In addition to 75 mg daily to equal 225 mg daily  04/07/24 04/07/25  Terra Fairy PARAS, PA-C  irbesartan  (AVAPRO ) 75 MG tablet Take 1 tablet (75 mg total) by mouth daily. In addition to 150 mg daily to equal 225 mg daily 04/07/24 04/07/25  Terra Fairy PARAS, PA-C  ketotifen  (ZADITOR ) 0.035 % ophthalmic solution Place 1 drop into both eyes daily as needed (Dry eye).    [provider]  levalbuterol  (XOPENEX  HFA) 45 MCG/ACT inhaler Inhale 2 puffs into the lungs in the morning, at noon, and at bedtime for 4 days, THEN 2 puffs every 6 (six) hours as needed for wheezing. 03/19/24 03/23/25  Sebastian Toribio GAILS, MD  loratadine  (CLARITIN ) 10 MG tablet Take 1 tablet (10 mg total) by mouth daily. 03/20/24   Sebastian Toribio GAILS, MD  metoprolol  tartrate (LOPRESSOR ) 25 MG tablet Take 0.5 tablets (12.5 mg total) by mouth 2 (two) times daily. 03/19/24   Sebastian Toribio GAILS, MD  mometasone -formoterol  (DULERA ) 200-5 MCG/ACT AERO Inhale 2 puffs into the lungs 2 (two) times daily. 03/19/24   Sebastian Toribio GAILS, MD  morphine  (MSIR) 15 MG tablet Take 0.5 tablets (7.5 mg total) by mouth every 4 (four) hours as needed. 05/09/24   Floyd, Dan, DO  Multiple Vitamin (MULTIVITAMIN WITH MINERALS) TABS tablet Take 1 tablet by mouth daily. 03/20/24   Sebastian Toribio GAILS, MD  pantoprazole  (PROTONIX ) 40 MG tablet Take 1 tablet (40 mg total) by mouth daily at 6 (six) AM. 03/20/24   Sebastian Toribio GAILS, MD  potassium chloride  (KLOR-CON ) 10 MEQ tablet Take 4 tablets (40 mEq total) by mouth daily. 05/05/24 08/03/24  Dunn, Dayna N, PA-C  sertraline  (ZOLOFT ) 50 MG tablet Take 50 mg by mouth in the morning.    [provider]  spironolactone  (ALDACTONE ) 25 MG tablet Take 1 tablet (25 mg total) by mouth daily. 04/27/24   Williams, Evan, PA-C  traMADol  (ULTRAM ) 50 MG tablet Take 1 tablet (50 mg total) by mouth every 6 (six) hours as needed for severe pain (pain score 7-10). Patient not taking: Reported on 04/27/2024 04/10/24   Baglia, Corrina, PA-C  vitamin B-12 (CYANOCOBALAMIN ) 500 MCG tablet Take 500  mcg by mouth in the morning.    [provider]  Allergies Hydrocodone  and Morphine  and codeine  Review of Systems Review of Systems As noted in HPI  Physical Exam Vital Signs  I have reviewed the triage vital signs BP 130/88   Pulse 88   Temp 98.3 F (36.8 C) (Oral)   Resp 16   SpO2 100%   Physical Exam Vitals reviewed.  Constitutional:      General: He is not in acute distress.    Appearance: He is well-developed. He is not diaphoretic.  HENT:     Head: Normocephalic and atraumatic.     Nose: Nose normal.     Mouth/Throat:     Mouth: Mucous membranes are dry.  Eyes:     General: No scleral icterus.       Right eye: No discharge.        Left eye: No discharge.     Conjunctiva/sclera: Conjunctivae normal.     Pupils: Pupils are equal, round, and reactive to light.  Cardiovascular:     Rate and Rhythm: Normal rate and regular rhythm.     Heart sounds: No murmur heard.    No friction rub. No gallop.  Pulmonary:     Effort: Pulmonary effort is normal. No respiratory distress.     Breath sounds: Normal breath sounds. No stridor. No rales.  Abdominal:     General: There is no distension.     Palpations: Abdomen is soft.     Tenderness: There is no abdominal tenderness.  Genitourinary:    Comments: Thank Musculoskeletal:     Left upper arm: Tenderness (to light touch over hyperemic/erythema noted in diagram) present.       Arms:     Cervical back: Normal range of motion and neck supple.  Skin:    General: Skin is warm and dry.     Findings: No erythema or rash.  Neurological:     Mental Status: He is alert and oriented to person, place, and time.     ED Results and Treatments Labs (all labs ordered are listed, but only abnormal results are displayed) Labs Reviewed  CBC WITH DIFFERENTIAL/PLATELET - Abnormal; Notable for  the following components:      Result Value   WBC 22.9 (*)    RBC 2.80 (*)    Hemoglobin 9.3 (*)    HCT 29.2 (*)    MCV 104.3 (*)    RDW 17.3 (*)    Neutro Abs 19.3 (*)    Monocytes Absolute 1.2 (*)    Abs Immature Granulocytes 0.21 (*)    All other components within normal limits  URINALYSIS, W/ REFLEX TO CULTURE (INFECTION SUSPECTED) - Abnormal; Notable for the following components:   Protein, ur 30 (*)    Leukocytes,Ua SMALL (*)    All other components within normal limits  COMPREHENSIVE METABOLIC PANEL WITH GFR - Abnormal; Notable for the following components:   CO2 20 (*)    Glucose, Bld 109 (*)    Calcium  7.6 (*)    Total Protein 5.9 (*)    Albumin 3.0 (*)    All other components within normal limits  PROTIME-INR - Abnormal; Notable for the following components:   Prothrombin Time 19.0 (*)    INR 1.5 (*)    All other components within normal limits  I-STAT CHEM 8, ED - Abnormal; Notable for the following components:   Glucose, Bld 101 (*)    Calcium , Ion 1.05 (*)    TCO2 19 (*)    Hemoglobin 9.5 (*)  HCT 28.0 (*)    All other components within normal limits  I-STAT VENOUS BLOOD GAS, ED - Abnormal; Notable for the following components:   pCO2, Ven 40.6 (*)    pO2, Ven 22 (*)    Bicarbonate 19.8 (*)    TCO2 21 (*)    Acid-base deficit 6.0 (*)    Calcium , Ion 1.04 (*)    HCT 26.0 (*)    Hemoglobin 8.8 (*)    All other components within normal limits  CULTURE, BLOOD (ROUTINE X 2)  CULTURE, BLOOD (ROUTINE X 2)  CBC  CREATININE, SERUM  I-STAT CG4 LACTIC ACID, ED  POC OCCULT BLOOD, ED  I-STAT CG4 LACTIC ACID, ED                                                                                                                         EKG  EKG Interpretation Date/Time:  Monday May 18 2024 02:06:49 EDT Ventricular Rate:  81 PR Interval:  145 QRS Duration:  80 QT Interval:  454 QTC Calculation: 528 R Axis:   70  Text Interpretation: Sinus rhythm Atrial  premature complex Probable anteroseptal infarct, old Minimal ST depression, lateral leads Prolonged QT interval Confirmed by Trine Likes [id number]) on 05/18/2024 3:31:09 AM       Radiology CT Head Wo Contrast Result Date: 05/18/2024 EXAM: CT HEAD WITHOUT CONTRAST 05/18/2024 07:07:26 AM TECHNIQUE: CT of the head was performed without the administration of intravenous contrast. Automated exposure control, iterative reconstruction, and/or weight based adjustment of the mA/kV was utilized to reduce the radiation dose to as low as reasonably achievable. COMPARISON: CT head without contrast 01/30/2011 CLINICAL HISTORY: Head trauma, minor (Age >= 65y). Clemens out of bed. FINDINGS: BRAIN AND VENTRICLES: No acute hemorrhage. Gray-white differentiation is preserved. No hydrocephalus. No extra-axial collection. No mass effect or midline shift. Progressive atrophy and white matter disease are present bilaterally. Lacunar infarcts of the basal ganglia bilaterally are new since the prior study but appear remote. A remote lacunar infarct is present in the right external capsule. ORBITS: No acute abnormality. SINUSES: No acute abnormality. SOFT TISSUES AND SKULL: No acute soft tissue abnormality. No skull fracture. Atherosclerotic calcifications are present in the cavernous carotid arteries bilaterally and at the dural margin of both vertebral arteries. No hyperdense vessel is present. IMPRESSION: 1. No acute intracranial abnormality related to the minor head trauma. 2. Progressive atrophy and white matter disease bilaterally. 3. New lacunar infarcts of the basal ganglia bilaterally since the prior study, appearing remote. 4. Atherosclerotic calcifications in the cavernous carotid arteries bilaterally and at the dural margin of both vertebral arteries. No hyperdense vessel. Electronically signed by: Lonni Necessary MD 05/18/2024 07:33 AM EDT RP Workstation: [id number]   DG Chest Port 1 View Result Date: 05/18/2024 EXAM: 1  VIEW XRAY OF THE CHEST 05/18/2024 05:13:00 AM COMPARISON: 1 view chest x-ray 05/09/2024 and 05/18/2024. CLINICAL HISTORY: Shortness of breath. 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 Progressive  superimposed left lower lobe airspace disease is present. No pleural effusion. No pneumothorax. HEART AND MEDIASTINUM: No acute abnormality of the cardiac and mediastinal silhouettes. Aortic atherosclerosis is present. BONES AND SOFT TISSUES: No acute osseous abnormality. IMPRESSION: 1. Progressive superimposed left lower lobe airspace disease is consistent with pneumonia or aspiration pneumonitis 2. Chronic pulmonary fibrotic changes. 3. Aortic atherosclerosis. Electronically signed by: Lonni Necessary MD 05/18/2024 06:10 AM EDT RP Workstation: [id number]   DG Chest Port 1 View Result Date: 05/18/2024 CLINICAL DATA:  Question of sepsis to evaluate for abnormality. Fall. Generalized fatigue. EXAM: PORTABLE CHEST 1 VIEW COMPARISON:  05/09/2024 FINDINGS: Elevation of the left hemidiaphragm. Interstitial pattern to the left lung is similar to prior study likely representing fibrosis. No consolidation or airspace disease. No pleural effusion or pneumothorax. Mediastinal contours appear intact. Calcification of the aorta. Degenerative changes in the spine and shoulders. 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 No evidence of active pulmonary disease. Electronically Signed   By: Elsie Gravely M.D.   On: 05/18/2024 01:34    Medications Ordered in ED Medications  albuterol  (PROVENTIL ) (2.5 MG/3ML) 0.083% nebulizer solution (0 mg/hr Nebulization Stopped 05/18/24 0556)  enoxaparin  (LOVENOX ) injection 40 mg (has no administration in time range)  sodium chloride  0.9 % bolus 1,000 mL (0 mLs Intravenous Stopped 05/18/24 0431)  ipratropium (ATROVENT ) nebulizer solution 0.5 mg (0.5 mg Nebulization Given 05/18/24 0516)  vancomycin  (VANCOCIN ) IVPB 1000 mg/200 mL premix  (0 mg Intravenous Stopped 05/18/24 0733)  ceFEPIme  (MAXIPIME ) 2 g in sodium chloride  0.9 % 100 mL IVPB (0 g Intravenous Stopped 05/18/24 0656)  diazepam  (VALIUM ) injection 2.5 mg (2.5 mg Intravenous Given 05/18/24 0623)   Procedures .Critical Care  Performed by: Trine Raynell Moder, MD Authorized by: Trine Raynell Moder, MD   Critical care provider statement:    Critical care time (minutes):  60   Critical care was necessary to treat or prevent imminent or life-threatening deterioration of the following conditions:  Respiratory failure   Critical care was time spent personally by me on the following activities:  Development of treatment plan with patient or surrogate, discussions with consultants, evaluation of patient's response to treatment, examination of patient, ordering and review of laboratory studies, ordering and review of radiographic studies, ordering and performing treatments and interventions, pulse oximetry, re-evaluation of patient's condition and review of old charts   Care discussed with: admitting provider     (including critical care time) Medical Decision Making / ED Course   Medical Decision Making Amount and/or Complexity of Data Reviewed Labs: ordered. Decision-making details documented in ED Course. Radiology: ordered and independent interpretation performed. Decision-making details documented in ED Course. ECG/medicine tests: ordered and independent interpretation performed. Decision-making details documented in ED Course.  Risk Prescription drug management. Decision regarding hospitalization.    Recurrent falls and generalized fatigue Dry mucous membranes.  No fever.  No tachycardia or hypotension.  Satting well on room air with no respiratory distress at rest. On exam patient has evidence of cellulitis in the left upper extremity.  Differential diagnosis considered.  CT head was negative for ICH.  Remote infarct noted  Infectious workup initiated.   Patient does have cellulitis of the left upper extremity.  CBC shows leukocytosis at 23,000.  Mild anemia with a negative Hemoccult. UA without evidence of infection Initial chest x-ray negative for pneumonia  lactic acid normal. Patient started on empiric antibiotics.  While having a bowel movement, patient became extremely tachypneic and shortness of breath resulting in  hypoxia with sats in the mid 80s on room air.  On examination, patient had diminished air movement with diffuse wheezing.  Given albuterol  treatment and required 4 L nasal cannula afterward.  Repeat chest x-ray showed increased markings concerning for pneumonitis versus pneumonia.  Antibiotics as above would cover.  Patient admitted to medicine for further workup and management    Final Clinical Impression(s) / ED Diagnoses Final diagnoses:  Cellulitis of left upper extremity  Acute hypoxemic respiratory failure (HCC)    This chart was dictated using voice recognition software.  Despite best efforts to proofread,  errors can occur which can change the documentation meaning.    Trine Raynell Moder, MD 05/18/24 [id number]

## 2024-05-18 NOTE — ED Notes (Signed)
 CT called to obtain scan

## 2024-05-18 NOTE — H&P (Addendum)
 History and Physical    Patient: Thomas Ferrell FMW:[id number] DOB: 07/15/1941 DOA: 05/18/2024 DOS: the patient was seen and examined on 05/18/2024 PCP: [name], MD  Patient coming from: Home  Chief Complaint:  Chief Complaint  Patient presents with   Fall   HPI: Thomas Ferrell is a 83 y.o. male with medical history significant of PAD, carotid artery stenosis s/p bilateral common femoral endarterectomy and bypass, HTN, HLD, prior history of tobacco abuse, stroke/TIA, and COPD p/w GLF from bed c/f FTT.   Pt states that he was in his USOH until Sunday evening at 2000 when he rolled over and fell out of bed. Pt reports being awake during the fall and denies LOC or head strike. He was unable to get himself up after the fall; thus, EMS was activated and pt BIBA to ED. Pt denies any recent sick contacts or feeling ill prior to admission.  In the ED, pt tachycardic and tachypneic on 4L [place] (baseline 2L [place] per report). Labs notable for VBG pH/CO2 7.29/40.6, iCa 1.05 (low), WBC 22.9-->13.7 (s/p 1L NS) and FOBT neg. CXR showed LLL consolidation c/f CAP. Pt admitted to medicine.  Review of Systems: As mentioned in the history of present illness. All other systems reviewed and are negative. Past Medical History:  Diagnosis Date   Abnormality of gait 03/03/2013   bladder ca dx'd 11/2009   chemo/xrt comp 02/2010   Cerebrovascular disease    right brain CVA 2007   COPD (chronic obstructive pulmonary disease) (HCC)    Cough productive of clear sputum    TX RECENT SINUS INFECTION    CVA (cerebral vascular accident) (HCC) 2007   r-cva   Dyslipidemia    Hypertension    Lumbago    Past Surgical History:  Procedure Laterality Date   ABDOMINAL AORTAGRAM N/A 12/07/2013   Procedure: ABDOMINAL EZELLA;  Surgeon: Lonni GORMAN Blade, MD;  Location: [facility] CATH LAB;  Service: Cardiovascular;  Laterality: N/A;   ABDOMINAL AORTOGRAM W/LOWER EXTREMITY N/A 06/26/2023   Procedure: ABDOMINAL AORTOGRAM  W/LOWER EXTREMITY;  Surgeon: Lanis Fonda BRAVO, MD;  Location: [facility] INVASIVE CV LAB;  Service: Cardiovascular;  Laterality: N/A;   BACK SURGERY  2010   bladder cancer     CAROTID ARTERY ANGIOPLASTY  2009   CHOLECYSTECTOMY N/A 06/06/2016   Procedure: LAPAROSCOPIC CHOLECYSTECTOMY WITH INTRAOPERATIVE CHOLANGIOGRAM;  Surgeon: Krystal Spinner, MD;  Location: [facility] ORS;  Service: General;  Laterality: N/A;   ERCP N/A 06/05/2016   Procedure: ENDOSCOPIC RETROGRADE CHOLANGIOPANCREATOGRAPHY (ERCP);  Surgeon: Norleen Hint, MD;  Location: [facility];  Service: Endoscopy;  Laterality: N/A;   ESOPHAGOGASTRODUODENOSCOPY (EGD) WITH PROPOFOL  N/A 06/05/2016   Procedure: ESOPHAGOGASTRODUODENOSCOPY (EGD);  Surgeon: Norleen Hint, MD;  Location: [facility];  Service: Endoscopy;  Laterality: N/A;   FEMORAL-FEMORAL BYPASS GRAFT N/A 12/09/2013   Procedure: BYPASS GRAFT FEMORAL-FEMORAL ARTERY WITH BILATERAL ENDARTERECTOMY ;  Surgeon: Carlin BRAVO Haddock, MD;  Location: [facility] OR;  Service: Vascular;  Laterality: N/A;   FEMORAL-POPLITEAL BYPASS GRAFT Left 12/09/2013   Procedure: BYPASS GRAFT FEMORAL-POPLITEAL ARTERY - LEFT ;  Surgeon: Carlin BRAVO Haddock, MD;  Location: [facility] OR;  Service: Vascular;  Laterality: Left;   LAMINECTOMY  09/08/2013   L 2 L3 L4 L5       Dr Amon   LOWER EXTREMITY ANGIOGRAPHY Right 07/03/2023   Procedure: Lower Extremity Angiography;  Surgeon: Lanis Fonda BRAVO, MD;  Location: [facility] INVASIVE CV LAB;  Service: Cardiovascular;  Laterality: Right;   PERIPHERAL VASCULAR BALLOON ANGIOPLASTY Right 07/03/2023  Procedure: PERIPHERAL VASCULAR BALLOON ANGIOPLASTY;  Surgeon: Lanis Fonda BRAVO, MD;  Location: [facility] INVASIVE CV LAB;  Service: Cardiovascular;  Laterality: Right;  R external iliac   Social History:  reports that he quit smoking about 18 years ago. His smoking use included cigarettes. He has never been exposed to tobacco smoke. He has never used smokeless tobacco. He reports that he does not drink alcohol  and does not use  drugs.  Allergies  Allergen Reactions   Hydrocodone  Other (See Comments)    hallucinations   Morphine  And Codeine Itching    Family History  Problem Relation Age of Onset   Stroke Mother    Cancer Father    Dementia Father    Cancer Sister     Prior to Admission medications   Medication Sig Start Date End Date Taking? Authorizing Provider  acetaminophen  (TYLENOL ) 500 MG tablet Take 500 mg by mouth every 6 (six) hours as needed.   Yes [provider]  apixaban  (ELIQUIS ) 2.5 MG TABS tablet Take 1 tablet (2.5 mg total) by mouth 2 (two) times daily. 04/07/24   Terra Fairy PARAS, PA-C  apixaban  (ELIQUIS ) 2.5 MG TABS tablet Take 1 tablet (2.5 mg total) by mouth 2 (two) times daily. 04/27/24   Trudy Birmingham, PA-C  aspirin  81 MG chewable tablet 1 tablet Orally Once a day    [provider]  atorvastatin  (LIPITOR) 20 MG tablet Take 20 mg by mouth in the morning. 07/17/22   [provider]  bismuth subsalicylate (PEPTO BISMOL) 262 MG chewable tablet Chew 262 mg by mouth as needed.    [provider]  Dextromethorphan  HBr (COUGH SUPPRESSANT PO) Take 1 tablet by mouth as needed. [employer]    [provider]  feeding supplement (ENSURE ENLIVE / ENSURE PLUS) LIQD Take 237 mLs by mouth 2 (two) times daily between meals. Patient not taking: Reported on 04/27/2024 03/20/24   Sebastian Toribio GAILS, MD  fluticasone  (FLONASE ) 50 MCG/ACT nasal spray Place 2 sprays into both nostrils daily. Patient taking differently: Place 2 sprays into both nostrils as needed. 03/20/24   Sebastian Toribio GAILS, MD  irbesartan  (AVAPRO ) 150 MG tablet Take 1 tablet (150 mg total) by mouth daily. In addition to 75 mg daily to equal 225 mg daily 04/07/24 04/07/25  Terra Fairy PARAS, PA-C  irbesartan  (AVAPRO ) 75 MG tablet Take 1 tablet (75 mg total) by mouth daily. In addition to 150 mg daily to equal 225 mg daily 04/07/24 04/07/25  Terra Fairy PARAS, PA-C  ketotifen  (ZADITOR ) 0.035 % ophthalmic solution  Place 1 drop into both eyes daily as needed (Dry eye).    [provider]  levalbuterol  (XOPENEX  HFA) 45 MCG/ACT inhaler Inhale 2 puffs into the lungs in the morning, at noon, and at bedtime for 4 days, THEN 2 puffs every 6 (six) hours as needed for wheezing. 03/19/24 03/23/25  Sebastian Toribio GAILS, MD  loratadine  (CLARITIN ) 10 MG tablet Take 1 tablet (10 mg total) by mouth daily. 03/20/24   Sebastian Toribio GAILS, MD  metoprolol  tartrate (LOPRESSOR ) 25 MG tablet Take 0.5 tablets (12.5 mg total) by mouth 2 (two) times daily. 03/19/24   Sebastian Toribio GAILS, MD  mometasone -formoterol  (DULERA ) 200-5 MCG/ACT AERO Inhale 2 puffs into the lungs 2 (two) times daily. 03/19/24   Sebastian Toribio GAILS, MD  morphine  (MSIR) 15 MG tablet Take 0.5 tablets (7.5 mg total) by mouth every 4 (four) hours as needed. 05/09/24   Emil Share, DO  Multiple Vitamin (MULTIVITAMIN WITH MINERALS)  TABS tablet Take 1 tablet by mouth daily. 03/20/24   Sebastian Toribio GAILS, MD  pantoprazole  (PROTONIX ) 40 MG tablet Take 1 tablet (40 mg total) by mouth daily at 6 (six) AM. 03/20/24   Sebastian Toribio GAILS, MD  potassium chloride  (KLOR-CON ) 10 MEQ tablet Take 4 tablets (40 mEq total) by mouth daily. 05/05/24 08/03/24  Dunn, Dayna N, PA-C  sertraline  (ZOLOFT ) 50 MG tablet Take 50 mg by mouth in the morning.    [provider]  spironolactone  (ALDACTONE ) 25 MG tablet Take 1 tablet (25 mg total) by mouth daily. 04/27/24   Williams, Evan, PA-C  traMADol  (ULTRAM ) 50 MG tablet Take 1 tablet (50 mg total) by mouth every 6 (six) hours as needed for severe pain (pain score 7-10). Patient not taking: Reported on 04/27/2024 04/10/24   Baglia, Corrina, PA-C  vitamin B-12 (CYANOCOBALAMIN ) 500 MCG tablet Take 500 mcg by mouth in the morning.    [provider]    Physical Exam: Vitals:   05/18/24 0846 05/18/24 1218 05/18/24 1219 05/18/24 1300  BP:  (!) 174/72  (!) 151/60  Pulse:  (!) 114  88  Resp:  (!) 28  16  Temp: 98.2 F (36.8 C)  98.2 F (36.8  C)   TempSrc: Oral  Oral   SpO2:  94%  100%   General: Alert, oriented x3, resting comfortably in no acute distress Respiratory: Lungs clear to auscultation bilaterally with normal respiratory effort; no w/r/r Cardiovascular: Regular rate and rhythm w/o m/r/g   Data Reviewed:  Lab Results  Component Value Date   WBC 13.7 (H) 05/18/2024   HGB 8.4 (L) 05/18/2024   HCT 27.2 (L) 05/18/2024   MCV 104.2 (H) 05/18/2024   PLT 220 05/18/2024   Lab Results  Component Value Date   GLUCOSE 109 (H) 05/18/2024   CALCIUM  7.6 (L) 05/18/2024   NA 136 05/18/2024   K 4.3 05/18/2024   CO2 20 (L) 05/18/2024   CL 105 05/18/2024   BUN 14 05/18/2024   CREATININE 0.86 05/18/2024   Lab Results  Component Value Date   ALT 25 05/18/2024   AST 35 05/18/2024   ALKPHOS 72 05/18/2024   BILITOT 1.2 05/18/2024   Lab Results  Component Value Date   INR 1.5 (H) 05/18/2024   INR 1.30 06/05/2016   INR 1.46 06/03/2016    Radiology: CT Head Wo Contrast Result Date: 05/18/2024 EXAM: CT HEAD WITHOUT CONTRAST 05/18/2024 07:07:26 AM TECHNIQUE: CT of the head was performed without the administration of intravenous contrast. Automated exposure control, iterative reconstruction, and/or weight based adjustment of the mA/kV was utilized to reduce the radiation dose to as low as reasonably achievable. COMPARISON: CT head without contrast 01/30/2011 CLINICAL HISTORY: Head trauma, minor (Age >= 65y). Clemens out of bed. FINDINGS: BRAIN AND VENTRICLES: No acute hemorrhage. Gray-white differentiation is preserved. No hydrocephalus. No extra-axial collection. No mass effect or midline shift. Progressive atrophy and white matter disease are present bilaterally. Lacunar infarcts of the basal ganglia bilaterally are new since the prior study but appear remote. A remote lacunar infarct is present in the right external capsule. ORBITS: No acute abnormality. SINUSES: No acute abnormality. SOFT TISSUES AND SKULL: No acute soft tissue  abnormality. No skull fracture. Atherosclerotic calcifications are present in the cavernous carotid arteries bilaterally and at the dural margin of both vertebral arteries. No hyperdense vessel is present. IMPRESSION: 1. No acute intracranial abnormality related to the minor head trauma. 2. Progressive atrophy and white matter disease bilaterally. 3.  New lacunar infarcts of the basal ganglia bilaterally since the prior study, appearing remote. 4. Atherosclerotic calcifications in the cavernous carotid arteries bilaterally and at the dural margin of both vertebral arteries. No hyperdense vessel. Electronically signed by: Lonni Necessary MD 05/18/2024 07:33 AM EDT RP Workstation: [id number]   DG Chest Port 1 View Result Date: 05/18/2024 EXAM: 1 VIEW XRAY OF THE CHEST 05/18/2024 05:13:00 AM COMPARISON: 1 view chest x-ray 05/09/2024 and 05/18/2024. CLINICAL HISTORY: Shortness of breath. 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 Progressive superimposed left lower lobe airspace disease is present. No pleural effusion. No pneumothorax. HEART AND MEDIASTINUM: No acute abnormality of the cardiac and mediastinal silhouettes. Aortic atherosclerosis is present. BONES AND SOFT TISSUES: No acute osseous abnormality. IMPRESSION: 1. Progressive superimposed left lower lobe airspace disease is consistent with pneumonia or aspiration pneumonitis 2. Chronic pulmonary fibrotic changes. 3. Aortic atherosclerosis. Electronically signed by: Lonni Necessary MD 05/18/2024 06:10 AM EDT RP Workstation: [id number]   DG Chest Port 1 View Result Date: 05/18/2024 CLINICAL DATA:  Question of sepsis to evaluate for abnormality. Fall. Generalized fatigue. EXAM: PORTABLE CHEST 1 VIEW COMPARISON:  05/09/2024 FINDINGS: Elevation of the left hemidiaphragm. Interstitial pattern to the left lung is similar to prior study likely representing fibrosis. No consolidation or airspace disease. No pleural  effusion or pneumothorax. Mediastinal contours appear intact. Calcification of the aorta. Degenerative changes in the spine and shoulders. 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 No evidence of active pulmonary disease. Electronically Signed   By: Elsie Gravely M.D.   On: 05/18/2024 01:34    Assessment and Plan: 48M h/o PAD, carotid artery stenosis s/p bilateral common femoral endarterectomy and bypass, HTN, HLD, prior history of tobacco abuse, stroke/TIA, and COPD p/w GLF from bed c/f FTT iso CAP.   FTT Physical deconditioning -RD consulted; apprec eval/recs -PT/OT consulted; apprec eval/recs -HOLD further abx for now -HOLD pta opioids for now as these may have contributed to fall -F/u calorie count  CAP -IV CTX 1g daily x4 days to complete 5 day CAP course -PO azithromycin  500mg  daily x3 doses to complete 3 day CAP course -Duonebs prn -Wean O2 as tolerated -Ambulatory pulse ox prior to d/c  RUE swelling -F/u CT/XR RUE  HTN -HOLD pta irbesartan  for now; resume once Cr stable  COPD -Breo Ellipta  inh daily + duonebs prn   Advance Care Planning:   Code Status: Full Code   Consults: N/A  Family Communication: N/A  Severity of Illness: The appropriate patient status for this patient is OBSERVATION. Observation status is judged to be reasonable and necessary in order to provide the required intensity of service to ensure the patient's safety. The patient's presenting symptoms, physical exam findings, and initial radiographic and laboratory data in the context of their medical condition is felt to place them at decreased risk for further clinical deterioration. Furthermore, it is anticipated that the patient will be medically stable for discharge from the hospital within 2 midnights of admission.    ------- I spent 55 minutes reviewing previous labs/notes, obtaining separate history at the bedside, counseling/discussing the treatment plan outlined above,  ordering medications/tests, and performing clinical documentation.  Author: Marsha Ada, MD 05/18/2024 1:11 PM  For on call review [email or web address].

## 2024-05-18 NOTE — ED Triage Notes (Signed)
 Pt rolled out of bed tonight onto the floor. Pt also having generalized fatigue. Pt states that he has been falling more as well. Pt did not hit his head

## 2024-05-18 NOTE — ED Notes (Signed)
 Pt declined continuous neb. D/C at this time - placed back on 4L [place], EDP and RT notified.

## 2024-05-18 NOTE — ED Notes (Signed)
Pt assisted with the bedpan.

## 2024-05-18 NOTE — Plan of Care (Signed)

## 2024-05-18 NOTE — ED Notes (Signed)
 Pts wife assisted the pt off the bed pan and the pt had increased WOB, O2 decreased to 87% - Dr. Trine present at the bedside, pt placed on 4L [place], RT notified of orders see [facility] for intervention. Pts wife strongly advised again to use the call bell for assistance. Both pt and wife verbalized understanding.

## 2024-05-18 NOTE — Progress Notes (Signed)
 Pt has soft tissue size of golf ball to right forearm. Notified MD and CT ordered of right arm.   Also has lesions on groin/penis. Pt sts these lesions are d/t radiation from his bladder cancer. MD made aware and will defer to next day provider.

## 2024-05-18 NOTE — ED Notes (Signed)
 Pt refused to continue with the CT scan.

## 2024-05-18 NOTE — ED Notes (Signed)
 Lab contacted about CBC results from 214; Lab states they are running them now.

## 2024-05-18 NOTE — Progress Notes (Signed)
   05/18/24 2035  Assess: MEWS Score  Temp 99.1 F (37.3 C)  BP (!) 172/67  Pulse Rate (!) 133  Resp (!) 26  Level of Consciousness Alert  SpO2 92 %  O2 Device Nasal Cannula  O2 Flow Rate (L/min) 3 L/min  Assess: MEWS Score  MEWS Temp 0  MEWS Systolic 0  MEWS Pulse 3  MEWS RR 2  MEWS LOC 0  MEWS Score 5  MEWS Score Color Red  Assess: if the MEWS score is Yellow or Red  Were vital signs accurate and taken at a resting state? No, vital signs rechecked  Does the patient meet 2 or more of the SIRS criteria? Yes  Does the patient have a confirmed or suspected source of infection? Yes  MEWS guidelines implemented   (no, vital signs rechecked after intervention)  Notify: Charge Nurse/RN  Name of Charge Nurse/RN Notified Dauda,RN  Provider Notification  Provider Name/Title Lynwood Kipper, NP  Date Provider Notified 05/18/24  Time Provider Notified 2044  Method of Notification  (secure chat)  Notification Reason Change in status  Provider response See new orders  Date of Provider Response 05/18/24  Time of Provider Response 2059  Assess: SIRS CRITERIA  SIRS Temperature  0  SIRS Respirations  1  SIRS Pulse 1  SIRS WBC 0  SIRS Score Sum  2    Pt very anxious per report and noted with what appears to be anxiety attack, O2 sats 92% on 3 liters, HR increased in 140s, labored breathing noted, resps 26, BP 172.67, diaphoretic, reports difficulty breathing and pain in feet. Pt noted to be very anxious and restless in bed.  Pt given prn neb tx with moderate effects noted. Pt reports feeling a little better, O2 sats now 100% on 3 liters, Heart rate 90s-100s.  Pt then subsequently reports lower back pain as well. On call provider, Lynwood Kipper, NP made aware, new orders noted for ultram , lidocaine  patch, tele and continuous pulse oximetry.

## 2024-05-19 ENCOUNTER — Encounter: Admitting: Physical Medicine & Rehabilitation

## 2024-05-19 DIAGNOSIS — I6522 Occlusion and stenosis of left carotid artery: Secondary | ICD-10-CM | POA: Diagnosis not present

## 2024-05-19 DIAGNOSIS — Z515 Encounter for palliative care: Secondary | ICD-10-CM | POA: Diagnosis not present

## 2024-05-19 DIAGNOSIS — L03114 Cellulitis of left upper limb: Secondary | ICD-10-CM | POA: Diagnosis not present

## 2024-05-19 DIAGNOSIS — R296 Repeated falls: Secondary | ICD-10-CM | POA: Diagnosis present

## 2024-05-19 DIAGNOSIS — J9601 Acute respiratory failure with hypoxia: Secondary | ICD-10-CM | POA: Diagnosis not present

## 2024-05-19 DIAGNOSIS — Z681 Body mass index (BMI) 19 or less, adult: Secondary | ICD-10-CM | POA: Diagnosis not present

## 2024-05-19 DIAGNOSIS — E785 Hyperlipidemia, unspecified: Secondary | ICD-10-CM | POA: Diagnosis not present

## 2024-05-19 DIAGNOSIS — I4891 Unspecified atrial fibrillation: Secondary | ICD-10-CM | POA: Diagnosis not present

## 2024-05-19 DIAGNOSIS — J439 Emphysema, unspecified: Secondary | ICD-10-CM | POA: Diagnosis not present

## 2024-05-19 DIAGNOSIS — J949 Pleural condition, unspecified: Secondary | ICD-10-CM | POA: Diagnosis not present

## 2024-05-19 DIAGNOSIS — J189 Pneumonia, unspecified organism: Secondary | ICD-10-CM | POA: Diagnosis not present

## 2024-05-19 DIAGNOSIS — R9389 Abnormal findings on diagnostic imaging of other specified body structures: Secondary | ICD-10-CM | POA: Diagnosis not present

## 2024-05-19 DIAGNOSIS — Z7401 Bed confinement status: Secondary | ICD-10-CM | POA: Diagnosis not present

## 2024-05-19 DIAGNOSIS — Z9181 History of falling: Secondary | ICD-10-CM | POA: Diagnosis not present

## 2024-05-19 DIAGNOSIS — Z66 Do not resuscitate: Secondary | ICD-10-CM | POA: Diagnosis not present

## 2024-05-19 DIAGNOSIS — E440 Moderate protein-calorie malnutrition: Secondary | ICD-10-CM | POA: Insufficient documentation

## 2024-05-19 DIAGNOSIS — K219 Gastro-esophageal reflux disease without esophagitis: Secondary | ICD-10-CM | POA: Diagnosis not present

## 2024-05-19 DIAGNOSIS — W06XXXA Fall from bed, initial encounter: Secondary | ICD-10-CM | POA: Diagnosis present

## 2024-05-19 DIAGNOSIS — R1313 Dysphagia, pharyngeal phase: Secondary | ICD-10-CM | POA: Diagnosis not present

## 2024-05-19 DIAGNOSIS — I5032 Chronic diastolic (congestive) heart failure: Secondary | ICD-10-CM | POA: Diagnosis not present

## 2024-05-19 DIAGNOSIS — W19XXXA Unspecified fall, initial encounter: Secondary | ICD-10-CM | POA: Diagnosis not present

## 2024-05-19 DIAGNOSIS — E8770 Fluid overload, unspecified: Secondary | ICD-10-CM | POA: Diagnosis not present

## 2024-05-19 DIAGNOSIS — R008 Other abnormalities of heart beat: Secondary | ICD-10-CM | POA: Diagnosis not present

## 2024-05-19 DIAGNOSIS — J441 Chronic obstructive pulmonary disease with (acute) exacerbation: Secondary | ICD-10-CM | POA: Diagnosis not present

## 2024-05-19 DIAGNOSIS — R001 Bradycardia, unspecified: Secondary | ICD-10-CM | POA: Diagnosis not present

## 2024-05-19 DIAGNOSIS — I739 Peripheral vascular disease, unspecified: Secondary | ICD-10-CM | POA: Diagnosis not present

## 2024-05-19 DIAGNOSIS — R531 Weakness: Secondary | ICD-10-CM | POA: Diagnosis not present

## 2024-05-19 DIAGNOSIS — D649 Anemia, unspecified: Secondary | ICD-10-CM | POA: Diagnosis not present

## 2024-05-19 DIAGNOSIS — J984 Other disorders of lung: Secondary | ICD-10-CM | POA: Diagnosis not present

## 2024-05-19 DIAGNOSIS — R0602 Shortness of breath: Secondary | ICD-10-CM | POA: Diagnosis not present

## 2024-05-19 DIAGNOSIS — M6259 Muscle wasting and atrophy, not elsewhere classified, multiple sites: Secondary | ICD-10-CM | POA: Diagnosis not present

## 2024-05-19 DIAGNOSIS — Z9981 Dependence on supplemental oxygen: Secondary | ICD-10-CM | POA: Diagnosis not present

## 2024-05-19 DIAGNOSIS — J690 Pneumonitis due to inhalation of food and vomit: Secondary | ICD-10-CM | POA: Diagnosis present

## 2024-05-19 DIAGNOSIS — R627 Adult failure to thrive: Secondary | ICD-10-CM | POA: Diagnosis not present

## 2024-05-19 DIAGNOSIS — I10 Essential (primary) hypertension: Secondary | ICD-10-CM | POA: Diagnosis not present

## 2024-05-19 DIAGNOSIS — I70208 Unspecified atherosclerosis of native arteries of extremities, other extremity: Secondary | ICD-10-CM | POA: Diagnosis not present

## 2024-05-19 DIAGNOSIS — L8990 Pressure ulcer of unspecified site, unspecified stage: Secondary | ICD-10-CM | POA: Insufficient documentation

## 2024-05-19 DIAGNOSIS — S5011XA Contusion of right forearm, initial encounter: Secondary | ICD-10-CM | POA: Diagnosis present

## 2024-05-19 DIAGNOSIS — I480 Paroxysmal atrial fibrillation: Secondary | ICD-10-CM | POA: Diagnosis present

## 2024-05-19 DIAGNOSIS — Z7189 Other specified counseling: Secondary | ICD-10-CM | POA: Diagnosis not present

## 2024-05-19 DIAGNOSIS — M545 Low back pain, unspecified: Secondary | ICD-10-CM | POA: Diagnosis not present

## 2024-05-19 DIAGNOSIS — J8410 Pulmonary fibrosis, unspecified: Secondary | ICD-10-CM | POA: Diagnosis not present

## 2024-05-19 DIAGNOSIS — R918 Other nonspecific abnormal finding of lung field: Secondary | ICD-10-CM | POA: Diagnosis not present

## 2024-05-19 DIAGNOSIS — R1310 Dysphagia, unspecified: Secondary | ICD-10-CM | POA: Diagnosis not present

## 2024-05-19 DIAGNOSIS — D509 Iron deficiency anemia, unspecified: Secondary | ICD-10-CM | POA: Diagnosis present

## 2024-05-19 DIAGNOSIS — I700 Atherosclerosis of aorta: Secondary | ICD-10-CM | POA: Diagnosis not present

## 2024-05-19 DIAGNOSIS — L89150 Pressure ulcer of sacral region, unstageable: Secondary | ICD-10-CM | POA: Diagnosis present

## 2024-05-19 DIAGNOSIS — K224 Dyskinesia of esophagus: Secondary | ICD-10-CM | POA: Diagnosis not present

## 2024-05-19 DIAGNOSIS — I7025 Atherosclerosis of native arteries of other extremities with ulceration: Secondary | ICD-10-CM | POA: Diagnosis not present

## 2024-05-19 DIAGNOSIS — J440 Chronic obstructive pulmonary disease with acute lower respiratory infection: Secondary | ICD-10-CM | POA: Diagnosis present

## 2024-05-19 DIAGNOSIS — R2689 Other abnormalities of gait and mobility: Secondary | ICD-10-CM | POA: Diagnosis not present

## 2024-05-19 DIAGNOSIS — J9621 Acute and chronic respiratory failure with hypoxia: Secondary | ICD-10-CM | POA: Diagnosis present

## 2024-05-19 DIAGNOSIS — G9341 Metabolic encephalopathy: Secondary | ICD-10-CM | POA: Diagnosis present

## 2024-05-19 DIAGNOSIS — Z7901 Long term (current) use of anticoagulants: Secondary | ICD-10-CM | POA: Diagnosis not present

## 2024-05-19 DIAGNOSIS — J449 Chronic obstructive pulmonary disease, unspecified: Secondary | ICD-10-CM | POA: Diagnosis not present

## 2024-05-19 DIAGNOSIS — Z87891 Personal history of nicotine dependence: Secondary | ICD-10-CM | POA: Diagnosis not present

## 2024-05-19 DIAGNOSIS — D539 Nutritional anemia, unspecified: Secondary | ICD-10-CM | POA: Diagnosis present

## 2024-05-19 DIAGNOSIS — M6281 Muscle weakness (generalized): Secondary | ICD-10-CM | POA: Diagnosis not present

## 2024-05-19 DIAGNOSIS — I2510 Atherosclerotic heart disease of native coronary artery without angina pectoris: Secondary | ICD-10-CM | POA: Diagnosis not present

## 2024-05-19 LAB — BASIC METABOLIC PANEL WITH GFR
Anion gap: 10 (ref 5–15)
BUN: 9 mg/dL (ref 8–23)
CO2: 18 mmol/L — ABNORMAL LOW (ref 22–32)
Calcium: 7.9 mg/dL — ABNORMAL LOW (ref 8.9–10.3)
Chloride: 111 mmol/L (ref 98–111)
Creatinine, Ser: 0.75 mg/dL (ref 0.61–1.24)
GFR, Estimated: >60 mL/min (ref >60)
Glucose, Bld: 113 mg/dL — ABNORMAL HIGH (ref 70–99)
Potassium: 3.5 mmol/L (ref 3.5–5.1)
Sodium: 139 mmol/L (ref 135–145)

## 2024-05-19 LAB — RETICULOCYTES
Immature Retic Fract: 25.9 % — ABNORMAL HIGH (ref 2.3–15.9)
RBC.: 2.62 MIL/uL — ABNORMAL LOW (ref 4.22–5.81)
Retic Count, Absolute: 93.3 K/uL (ref 19.0–186.0)
Retic Ct Pct: 3.6 % — ABNORMAL HIGH (ref 0.4–3.1)

## 2024-05-19 LAB — FOLATE: Folate: 13.7 ng/mL (ref >5.9)

## 2024-05-19 LAB — FERRITIN: Ferritin: 142 ng/mL (ref 24–336)

## 2024-05-19 LAB — IRON AND TIBC
Iron: 17 ug/dL — ABNORMAL LOW (ref 45–182)
Saturation Ratios: 6 % — ABNORMAL LOW (ref 17.9–39.5)
TIBC: 274 ug/dL (ref 250–450)
UIBC: 257 ug/dL

## 2024-05-19 LAB — VITAMIN B12: Vitamin B-12: 1360 pg/mL — ABNORMAL HIGH (ref 180–914)

## 2024-05-19 MED ORDER — IRBESARTAN 300 MG PO TABS
150.0000 mg | ORAL_TABLET | Freq: Every day | ORAL | Status: DC
  Administered 2024-05-19 – 2024-05-26 (×8): 150 mg via ORAL
  Filled 2024-05-19 (×8): qty 1

## 2024-05-19 MED ORDER — METOPROLOL TARTRATE 12.5 MG HALF TABLET
12.5000 mg | ORAL_TABLET | Freq: Two times a day (BID) | ORAL | Status: DC
  Administered 2024-05-19 – 2024-05-21 (×5): 12.5 mg via ORAL
  Filled 2024-05-19 (×5): qty 1

## 2024-05-19 MED ORDER — DOXYCYCLINE HYCLATE 100 MG PO TABS
100.0000 mg | ORAL_TABLET | Freq: Two times a day (BID) | ORAL | Status: AC
  Administered 2024-05-19 – 2024-05-22 (×8): 100 mg via ORAL
  Filled 2024-05-19 (×8): qty 1

## 2024-05-19 MED ORDER — SPIRONOLACTONE 25 MG PO TABS
25.0000 mg | ORAL_TABLET | Freq: Every day | ORAL | Status: DC
  Administered 2024-05-19 – 2024-05-26 (×8): 25 mg via ORAL
  Filled 2024-05-19 (×8): qty 1

## 2024-05-19 NOTE — Evaluation (Signed)
 Occupational Therapy Evaluation Patient Details Name: Thomas Ferrell MRN: [id number] DOB: 11-14-1940 Today's Date: 05/19/2024   History of Present Illness   83 y.o. male presents to [facility] 05/18/24 post ground level fall.  PMHx: PAD, carotid artery stenosis, hypertension, hyperlipidemia, prior history of tobacco abuse, stroke/TIA, COPD, anemia.  Recent hospitalization 5/25 for SOB and COPD.     Clinical Impressions Patient admitted for the diagnosis above.  PTA he lives at home with home O2, and requires assist for ADL and simple transfers from his spouse.  The spouse is not able to provide physical assist for transfers due to health problems.  Currently he is performing below his baseline for transfers and ADL completion sitting EOB.  He needs near total assist for transfer to the recliner, and Max A for ADL completion at bedlevel for lower body.  OT will continue efforts in the acute setting to address deficits.  Patient will benefit from continued inpatient follow up therapy, <3 hours/day.     If plan is discharge home, recommend the following:   Assist for transportation;Assistance with cooking/housework;Two people to help with walking and/or transfers;A lot of help with bathing/dressing/bathroom;Help with stairs or ramp for entrance     Functional Status Assessment   Patient has had a recent decline in their functional status and demonstrates the ability to make significant improvements in function in a reasonable and predictable amount of time.     Equipment Recommendations   None recommended by OT     Recommendations for Other Services         Precautions/Restrictions   Precautions Precautions: Fall Recall of Precautions/Restrictions: Intact Precaution/Restrictions Comments: Very anxious, needs a little extra time. Restrictions Weight Bearing Restrictions Per Provider Order: No     Mobility Bed Mobility Overal bed mobility: Needs Assistance Bed Mobility: Supine  to Sit     Supine to sit: Mod assist       Patient Response: Cooperative  Transfers Overall transfer level: Needs assistance   Transfers: Bed to chair/wheelchair/BSC     Squat pivot transfers: Total assist, +2 safety/equipment              Balance Overall balance assessment: Needs assistance Sitting-balance support: Feet supported Sitting balance-Leahy Scale: Fair       Standing balance-Leahy Scale: Zero                             ADL either performed or assessed with clinical judgement   ADL Overall ADL's : Needs assistance/impaired Eating/Feeding: Set up;Sitting   Grooming: Wash/dry hands;Wash/dry face;Minimal assistance;Sitting   Upper Body Bathing: Moderate assistance;Sitting   Lower Body Bathing: Maximal assistance;Bed level   Upper Body Dressing : Moderate assistance;Sitting   Lower Body Dressing: Maximal assistance;Bed level   Toilet Transfer: Total assistance;Squat-pivot;+2 for safety/equipment   Toileting- Clothing Manipulation and Hygiene: Bed level;Maximal assistance               Vision Baseline Vision/History: 1 Wears glasses Patient Visual Report: No change from baseline       Perception Perception: Not tested       Praxis Praxis: Not tested       Pertinent Vitals/Pain Pain Assessment Pain Assessment: Faces Faces Pain Scale: Hurts little more Pain Location: Chronic back and leg pain Pain Descriptors / Indicators: Sore, Restless Pain Intervention(s): Monitored during session     Extremity/Trunk Assessment Upper Extremity Assessment Upper Extremity Assessment: Generalized weakness;Right hand dominant   Lower  Extremity Assessment Lower Extremity Assessment: Defer to PT evaluation   Cervical / Trunk Assessment Cervical / Trunk Assessment: Kyphotic   Communication Communication Communication: Impaired Factors Affecting Communication: Difficulty expressing self   Cognition Arousal: Alert Behavior During  Therapy: Anxious Cognition: Cognition impaired       Memory impairment (select all impairments): Short-term memory Attention impairment (select first level of impairment): Sustained attention Executive functioning impairment (select all impairments): Initiation                   Following commands: Intact       Cueing  General Comments   Cueing Techniques: Verbal cues   VSS on O2   Exercises     Shoulder Instructions      Home Living Family/patient expects to be discharged to:: Private residence Living Arrangements: Spouse/significant other Available Help at Discharge: Family;Available 24 hours/day Type of Home: House Home Access: Ramped entrance     Home Layout: One level     Bathroom Shower/Tub: Tub/shower unit;Curtain   Bathroom Toilet: Standard Bathroom Accessibility: Yes   Home Equipment: Building control surveyor (4 wheels);Shower seat;Grab bars - tub/shower;Hand held shower head;Adaptive equipment Adaptive Equipment: Reacher;Long-handled sponge        Prior Functioning/Environment Prior Level of Function : Needs assist             Mobility Comments: laterally scoots to transport chair for mobility, wife assists with bed mobility and pushes transport chair ADLs Comments: wife will bring socks/underwear/pants to knees for pt to pull on.  Patient does not partiicpate with any iADL, and needs assist with medications.    OT Problem List: Decreased strength;Decreased range of motion;Decreased activity tolerance;Impaired balance (sitting and/or standing);Decreased safety awareness;Pain   OT Treatment/Interventions: Self-care/ADL training;Therapeutic activities;Patient/family education;Balance training      OT Goals(Current goals can be found in the care plan section)   Acute Rehab OT Goals Patient Stated Goal: Return home OT Goal Formulation: With patient Time For Goal Achievement: 06/02/24 Potential to Achieve Goals: Good ADL Goals Pt  Will Perform Grooming: with supervision;sitting Pt Will Perform Upper Body Dressing: with supervision;sitting Pt Will Perform Lower Body Dressing: with min assist;sitting/lateral leans Pt Will Transfer to Toilet: with min assist;with transfer board   OT Frequency:  Min 2X/week    Co-evaluation              AM-PAC OT 6 Clicks Daily Activity     Outcome Measure Help from another person eating meals?: A Little Help from another person taking care of personal grooming?: A Little Help from another person toileting, which includes using toliet, bedpan, or urinal?: A Lot Help from another person bathing (including washing, rinsing, drying)?: A Lot Help from another person to put on and taking off regular upper body clothing?: A Lot Help from another person to put on and taking off regular lower body clothing?: A Lot 6 Click Score: 14   End of Session Equipment Utilized During Treatment: Oxygen  Nurse Communication: Mobility status  Activity Tolerance: Patient tolerated treatment well Patient left: in chair;with call bell/phone within reach;with nursing/sitter in room;with family/visitor present  OT Visit Diagnosis: Unsteadiness on feet (R26.81);Muscle weakness (generalized) (M62.81)                Time: 9069-9043 OT Time Calculation (min): 26 min Charges:  OT General Charges $OT Visit: 1 Visit OT Evaluation $OT Eval Moderate Complexity: 1 Mod OT Treatments $Self Care/Home Management : 8-22 mins  05/19/2024  RP, OTR/L  Acute Rehabilitation Services  Office:  [phone]   Charlie JONETTA Halsted 05/19/2024, 10:07 AM

## 2024-05-19 NOTE — Evaluation (Signed)
 Physical Therapy Evaluation Patient Details Name: Thomas Ferrell MRN: [id number] DOB: [birth year] Today's Date: 05/19/2024  History of Present Illness  Pt is an 83 y/o male who presents to [facility] 05/18/24 post ground level fall.  PMH significant for PAD, carotid artery stenosis, HTN, prior history of tobacco abuse, stroke/TIA, COPD, anemia.  Recent hospitalization 5/25 for SOB and COPD.   Clinical Impression  Pt admitted with above diagnosis. Pt currently with functional limitations due to the deficits listed below (see PT Problem List). At the time of PT eval pt was able to perform transfers with +2 assist and use of Stedy for support and safety. Pt reports he does stand to transfer at times at home. Noted difficulty with knee flexion while sitting EOB, and upon stand pt with poor core strength and ability to achieve upright posture. Recommend continued skilled therapy services <3 hours/day at d/c to maximize functional independence, safety, and return to PLOF. Pt will benefit from acute skilled PT to increase their independence and safety with mobility to allow discharge.           If plan is discharge home, recommend the following: A lot of help with walking and/or transfers;A lot of help with bathing/dressing/bathroom;Assistance with cooking/housework;Assist for transportation;Help with stairs or ramp for entrance   Can travel by private vehicle   No    Equipment Recommendations None recommended by PT  Recommendations for Other Services       Functional Status Assessment Patient has had a recent decline in their functional status and demonstrates the ability to make significant improvements in function in a reasonable and predictable amount of time.     Precautions / Restrictions Precautions Precautions: Fall Recall of Precautions/Restrictions: Intact Precaution/Restrictions Comments: Very anxious, needs a little extra time. Restrictions Weight Bearing Restrictions Per Provider Order: No       Mobility  Bed Mobility Overal bed mobility: Needs Assistance Bed Mobility: Supine to Sit, Sit to Supine     Supine to sit: Mod assist, +2 for safety/equipment, HOB elevated Sit to supine: Mod assist, +2 for physical assistance   General bed mobility comments: Assist for trunk elevation to full sitting position and support initially in sitting until pt could gain balance. At end of session assist for LE elevation back up into bed and positioning.    Transfers Overall transfer level: Needs assistance Equipment used: Ambulation equipment used Transfers: Sit to/from Stand Sit to Stand: Via lift equipment, Mod assist, +2 physical assistance           General transfer comment: Pt tolerated x3 sit<>stands within frame of Stedy. Pt with poor posture and difficulty extending hips and trunk to upright position. Transfer via Lift Equipment: Stedy  Ambulation/Gait               General Gait Details: Pt with limited to no ambulation at baseline.  Stairs            Wheelchair Mobility     Tilt Bed    Modified Rankin (Stroke Patients Only)       Balance Overall balance assessment: Needs assistance Sitting-balance support: Feet supported Sitting balance-Leahy Scale: Fair     Standing balance support: Bilateral upper extremity supported, Reliant on assistive device for balance Standing balance-Leahy Scale: Zero                               Pertinent Vitals/Pain Pain Assessment Pain Assessment: Faces Faces Pain  Scale: Hurts little more Pain Location: Chronic back and leg pain Pain Descriptors / Indicators: Sore, Restless Pain Intervention(s): Limited activity within patient's tolerance, Monitored during session, Repositioned    Home Living Family/patient expects to be discharged to:: Private residence Living Arrangements: Spouse/significant other Available Help at Discharge: Family;Available 24 hours/day Type of Home: House Home Access:  Ramped entrance       Home Layout: One level Home Equipment: Building control surveyor (4 wheels);Shower seat;Grab bars - tub/shower;Hand held shower head;Adaptive equipment      Prior Function Prior Level of Function : Needs assist             Mobility Comments: laterally scoots to transport chair for mobility, wife assists with bed mobility and pushes transport chair ADLs Comments: wife will bring socks/underwear/pants to knees for pt to pull on.  Patient does not partiicpate with any iADL, and needs assist with medications.     Extremity/Trunk Assessment   Upper Extremity Assessment Upper Extremity Assessment: Generalized weakness    Lower Extremity Assessment Lower Extremity Assessment: Generalized weakness;RLE deficits/detail RLE Deficits / Details: Bilaterally noted decreased knee flexion in sitting. Overall LE's and posterior chain with quick muscular fatigue during functional activity.    Cervical / Trunk Assessment Cervical / Trunk Assessment: Kyphotic  Communication   Communication Communication: Impaired Factors Affecting Communication: Difficulty expressing self    Cognition Arousal: Alert Behavior During Therapy: Anxious                             Following commands: Intact       Cueing Cueing Techniques: Verbal cues     General Comments      Exercises     Assessment/Plan    PT Assessment Patient needs continued PT services  PT Problem List Decreased strength;Decreased activity tolerance;Decreased range of motion;Decreased balance;Decreased mobility;Decreased knowledge of use of DME;Decreased safety awareness;Decreased knowledge of precautions;Pain       PT Treatment Interventions DME instruction;Gait training;Functional mobility training;Therapeutic activities;Therapeutic exercise;Balance training;Patient/family education    PT Goals (Current goals can be found in the Care Plan section)  Acute Rehab PT Goals Patient Stated  Goal: Feel better PT Goal Formulation: With patient/family Time For Goal Achievement: 06/02/24 Potential to Achieve Goals: Fair    Frequency Min 2X/week     Co-evaluation               AM-PAC PT 6 Clicks Mobility  Outcome Measure Help needed turning from your back to your side while in a flat bed without using bedrails?: A Lot Help needed moving from lying on your back to sitting on the side of a flat bed without using bedrails?: A Lot Help needed moving to and from a bed to a chair (including a wheelchair)?: Total Help needed standing up from a chair using your arms (e.g., wheelchair or bedside chair)?: A Lot Help needed to walk in hospital room?: Total Help needed climbing 3-5 steps with a railing? : Total 6 Click Score: 9    End of Session Equipment Utilized During Treatment: Gait belt Activity Tolerance: Patient tolerated treatment well Patient left: in bed;with call bell/phone within reach;with bed alarm set;with family/visitor present Nurse Communication: Mobility status PT Visit Diagnosis: Unsteadiness on feet (R26.81);Difficulty in walking, not elsewhere classified (R26.2)    Time: 8859-8841 PT Time Calculation (min) (ACUTE ONLY): 18 min   Charges:   PT Evaluation $PT Eval Moderate Complexity: 1 Mod   PT General Charges $$  ACUTE PT VISIT: 1 Visit         Leita Sable, PT, DPT Acute Rehabilitation Services Secure Chat Preferred Office: [phone]   Leita JONETTA Sable 05/19/2024, 2:27 PM

## 2024-05-19 NOTE — Progress Notes (Signed)
 Unable to cath, met resistance pt begans yelling with advancement of catheter. 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 Pt noted with some incontinence of urine when checked, bladder scan 213 ml, reports less pain but still has urge to void. Lynwood Kipper, NP notified. New orders noted.

## 2024-05-19 NOTE — Progress Notes (Signed)
 RT instructed patient and family on the use of a flutter valve and incentive spirometer. Patient able to reach 1000 mL using the IS and had a non productive cough following flutter valve use.

## 2024-05-19 NOTE — Plan of Care (Signed)
   Problem: Education: Goal: Knowledge of General Education information will improve Description: Including pain rating scale, medication(s)/side effects and non-pharmacologic comfort measures Outcome: Progressing   Problem: Health Behavior/Discharge Planning: Goal: Ability to manage health-related needs will improve Outcome: Not Progressing   Problem: Clinical Measurements: Goal: Ability to maintain clinical measurements within normal limits will improve Outcome: Progressing

## 2024-05-19 NOTE — Progress Notes (Signed)
 Pt is complaining of abdominal pain, with the inability to urinate after several attempts. Reports he has the urge but with multiple attempts patient only able to urinate approximately 20 ml. Bladder scan performed 300 ml noted. Last void 1856. Also  he has dyspnea with activity and rest, restless and fidgety, removing leads and nasal cannula at times. O2 sats 94% on 3 liters.  Lynwood Daniels-NP notified. Respiratory contacted for further assist of intervention if needed.  New orders noted.

## 2024-05-19 NOTE — TOC Initial Note (Signed)
 Transition of Care [facility]) - Initial/Assessment Note    Patient Details  Name: Thomas Ferrell MRN: [id number] Date of Birth: 06/21/1941  Transition of Care [facility]) CM/SW Contact:    Bridget Cordella Simmonds, LCSW Phone Number: 05/19/2024, 3:51 PM  Clinical Narrative:    Pt wife Avelina returned CSW call, provides all information.  Pt from home with wife, HH in place until one month ago.  Pt walked with walker until recently.  Wife reports she is unable to physically assist pt and does agree that he needs SNF at DC.  Permission given to send out referral in the hub.  She may not be able to come to the hospital tomorrow, would like updates over the phone.  Referral sent out in hub for SNF.                Expected Discharge Plan: Skilled Nursing Facility Barriers to Discharge: Continued Medical Work up, SNF Pending bed offer   Patient Goals and CMS Choice Patient states their goals for this hospitalization and ongoing recovery are:: go home          Expected Discharge Plan and Services In-house Referral: Clinical Social Work   Post Acute Care Choice: Skilled Nursing Facility (per wife) Living arrangements for the past 2 months: Single Family Home                                      Prior Living Arrangements/Services Living arrangements for the past 2 months: Single Family Home Lives with:: Spouse Patient language and need for interpreter reviewed:: Yes Do you feel safe going back to the place where you live?: Yes      Need for Family Participation in Patient Care: Yes (Comment) Care giver support system in place?: Yes (comment) Current home services: Other (comment) (none-HH ended one month ago) Criminal Activity/Legal Involvement Pertinent to Current Situation/Hospitalization: No - Comment as needed  Activities of Daily Living   ADL Screening (condition at time of admission) Independently performs ADLs?: No Does the patient have a NEW difficulty with  bathing/dressing/toileting/self-feeding that is expected to last >3 days?: Yes (Initiates electronic notice to provider for possible OT consult) Does the patient have a NEW difficulty with getting in/out of bed, walking, or climbing stairs that is expected to last >3 days?: Yes (Initiates electronic notice to provider for possible PT consult) Does the patient have a NEW difficulty with communication that is expected to last >3 days?: No Is the patient deaf or have difficulty hearing?: No Does the patient have difficulty seeing, even when wearing glasses/contacts?: No Does the patient have difficulty concentrating, remembering, or making decisions?: No  Permission Sought/Granted Permission sought to share information with : Family Supports Permission granted to share information with : Yes, Verbal Permission Granted  Share Information with NAME: wife Avelina           Emotional Assessment Appearance:: Appears stated age Attitude/Demeanor/Rapport: Engaged Affect (typically observed): Agitated Orientation: : Oriented to Self, Oriented to Place, Oriented to Situation      Admission diagnosis:  Fall [W19.XXXA] Cellulitis of left upper extremity [L03.114] Acute hypoxemic respiratory failure (HCC) [J96.01] PNA (pneumonia) [J18.9] Patient Active Problem List   Diagnosis Date Noted   PNA (pneumonia) 05/19/2024   Pressure injury of skin 05/19/2024   Malnutrition of moderate degree 05/19/2024   Fall 05/18/2024   FTT (failure to thrive) in adult 05/18/2024   Atherosclerosis  of coronary artery of native heart without angina pectoris 04/27/2024   Aortic atherosclerosis (HCC) 04/27/2024   Chronic heart failure with preserved ejection fraction (HCC) 04/27/2024   Atrial fibrillation (HCC) 03/18/2024   New onset a-fib (HCC) 03/17/2024   Acute heart failure (HCC) 03/17/2024   COPD with acute exacerbation (HCC) 03/17/2024   Hypokalemia 03/17/2024   Essential hypertension 03/17/2024   HLD  (hyperlipidemia) 03/17/2024   Anemia 03/17/2024   Cholelithiasis with acute cholecystitis 06/06/2016   Abdominal pain    Common bile duct (CBD) obstruction    CAP (community acquired pneumonia) 06/02/2016   Aftercare following surgery of the circulatory system 10/21/2014   Drainage from wound 10/21/2014   Atherosclerotic PVD with ulceration (HCC) 09/01/2014   Ulcer of lower limb (HCC) 12/31/2013   PAD (peripheral artery disease) (HCC) 12/09/2013   Atherosclerosis of native arteries of the extremities with ulceration (HCC) 12/03/2013   Abnormality of gait 03/03/2013   Lumbago 03/03/2013   PCP:  [name], MD Pharmacy:   [id number] - [place], [place] - [address] [place] [address] Phone: [phone] Fax: [phone]  [facility] [address] [place] [address] Phone: [phone] Fax: [phone]     Social Drivers of Health (SDOH) Social History: SDOH Screenings   Food Insecurity: No Food Insecurity (05/18/2024)  Housing: Low Risk  (05/18/2024)  Transportation Needs: No Transportation Needs (05/18/2024)  Utilities: Not At Risk (05/18/2024)  Financial Resource Strain: Low Risk  (04/28/2024)  Social Connections: Patient Unable To Answer (05/18/2024)  Recent Concern: Social Connections - Moderately Isolated (03/18/2024)  Tobacco Use: Medium Risk (05/18/2024)  Health Literacy: Adequate Health Literacy (04/28/2024)   SDOH Interventions:     Readmission Risk Interventions    03/19/2024    1:30 PM  Readmission Risk Prevention Plan  Post Dischage Appt Complete  Medication Screening Complete  Transportation Screening Complete

## 2024-05-19 NOTE — Progress Notes (Signed)
 [facility] PROGRESS NOTE    Progress Note  ALADDIN KOLLMANN  FMW:[id number] DOB: 1941/05/06 DOA: 05/18/2024 PCP: [name], MD     Brief Narrative:   Thomas Ferrell is an 83 y.o. male past medical history significant for peripheral arterial disease, carotid stenosis status post bilateral common femoral endarterectomy and bypass, essential hypertension, history of TIA, paroxysmal atrial fibrillation on Eliquis  COPD on 2 L of oxygen  at home per patient's report failure to thrive he called EMS as he could not get get up from the floor he did not hit his head.  White blood cell count of 23,000 chest x-ray showed left lower lobe consolidation   Assessment/Plan:   Acute on chronic respiratory failure with hypoxia secondary to pneumonia: Started on Rocephin  and azithromycin . Continue inhalers.  Has remained afebrile. Leukocytosis has significantly improved. Continues to require 3 L of oxygen  at baseline at home is at 2.  Paroxysmal atrial fibrillation on Eliquis : Rate controlled metoprolol  continue Eliquis . Discontinue ibuprofen I will inform that he cannot be on NSAIDs.  Macrocytic anemia: Check a B12 and folate. Hemoglobin is relatively stable. He denies any bright red blood per rectum.  Physical deconditioning/ Fall: PT OT consulted.  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  Right lower extremity swelling: CT of the right upper extremity is pending.  Essential hypertension: Blood pressure is elevated resume home antihypertensive regimen.  Sacral decubitus ulcer present on admission unstageable  DVT prophylaxis: Eliquis  Family Communication: None Status is: Observation The patient will require care spanning > 2 midnights and should be moved to inpatient because: Acute respiratory failure with hypoxia due to pneumonia    Code Status:     Code Status Orders  (From admission, onward)           Start     Ordered   05/18/24 0743  Full code  Continuous        Question:  By:  Answer:  Consent: discussion documented in EHR   05/18/24 0742           Code Status History     Date Active Date Inactive Code Status Order ID Comments User Context   03/17/2024 2251 03/19/2024 2127 Full Code [id number]  Franky Redia SAILOR, MD ED   07/03/2023 1258 07/03/2023 2227 Full Code [id number]  Lanis Fonda BRAVO, MD Inpatient   06/26/2023 1040 06/26/2023 1731 Full Code [id number]  Lanis Fonda BRAVO, MD Inpatient   06/02/2016 1747 06/08/2016 1913 Full Code [id number]  Dennise Lavada POUR, MD ED   12/09/2013 2016 12/12/2013 1756 Full Code [id number]  [place] Lucie PARAS, PA-C Inpatient   09/08/2013 1427 09/12/2013 1346 Full Code [id number]  Amon Lynwood SAUNDERS, MD Inpatient      Advance Directive Documentation    Flowsheet Row Most Recent Value  Type of Advance Directive Healthcare Power of Attorney  Pre-existing out of facility DNR order (yellow form or pink MOST form) --  MOST Form in Place? --      IV Access:   Peripheral IV   Procedures and diagnostic studies:   CT Head Wo Contrast Result Date: 05/18/2024 EXAM: CT HEAD WITHOUT CONTRAST 05/18/2024 07:07:26 AM TECHNIQUE: CT of the head was performed without the administration of intravenous contrast. Automated exposure control, iterative reconstruction, and/or weight based adjustment of the mA/kV was utilized to reduce the radiation dose to as low as reasonably achievable. COMPARISON: CT head without contrast 01/30/2011 CLINICAL HISTORY: Head trauma, minor (Age >= 65y). Clemens out of bed. FINDINGS: BRAIN AND  VENTRICLES: No acute hemorrhage. Gray-white differentiation is preserved. No hydrocephalus. No extra-axial collection. No mass effect or midline shift. Progressive atrophy and white matter disease are present bilaterally. Lacunar infarcts of the basal ganglia bilaterally are new since the prior study but appear remote. A remote lacunar infarct is present in the right external capsule. ORBITS: No acute abnormality. SINUSES: No  acute abnormality. SOFT TISSUES AND SKULL: No acute soft tissue abnormality. No skull fracture. Atherosclerotic calcifications are present in the cavernous carotid arteries bilaterally and at the dural margin of both vertebral arteries. No hyperdense vessel is present. IMPRESSION: 1. No acute intracranial abnormality related to the minor head trauma. 2. Progressive atrophy and white matter disease bilaterally. 3. New lacunar infarcts of the basal ganglia bilaterally since the prior study, appearing remote. 4. Atherosclerotic calcifications in the cavernous carotid arteries bilaterally and at the dural margin of both vertebral arteries. No hyperdense vessel. Electronically signed by: Lonni Necessary MD 05/18/2024 07:33 AM EDT RP Workstation: [id number]   DG Chest Port 1 View Result Date: 05/18/2024 EXAM: 1 VIEW XRAY OF THE CHEST 05/18/2024 05:13:00 AM COMPARISON: 1 view chest x-ray 05/09/2024 and 05/18/2024. CLINICAL HISTORY: Shortness of breath. 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 Progressive superimposed left lower lobe airspace disease is present. No pleural effusion. No pneumothorax. HEART AND MEDIASTINUM: No acute abnormality of the cardiac and mediastinal silhouettes. Aortic atherosclerosis is present. BONES AND SOFT TISSUES: No acute osseous abnormality. IMPRESSION: 1. Progressive superimposed left lower lobe airspace disease is consistent with pneumonia or aspiration pneumonitis 2. Chronic pulmonary fibrotic changes. 3. Aortic atherosclerosis. Electronically signed by: Lonni Necessary MD 05/18/2024 06:10 AM EDT RP Workstation: [id number]   DG Chest Port 1 View Result Date: 05/18/2024 CLINICAL DATA:  Question of sepsis to evaluate for abnormality. Fall. Generalized fatigue. EXAM: PORTABLE CHEST 1 VIEW COMPARISON:  05/09/2024 FINDINGS: Elevation of the left hemidiaphragm. Interstitial pattern to the left lung is similar to prior study likely representing  fibrosis. No consolidation or airspace disease. No pleural effusion or pneumothorax. Mediastinal contours appear intact. Calcification of the aorta. Degenerative changes in the spine and shoulders. 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 No evidence of active pulmonary disease. Electronically Signed   By: Elsie Gravely M.D.   On: 05/18/2024 01:34     Medical Consultants:   None.   Subjective:    NAGI FURIO relates his breathing is about the same not improved  Objective:    Vitals:   05/18/24 2035 05/18/24 2044 05/18/24 2348 05/19/24 0405  BP: (!) 172/67 (!) 169/71 (!) 109/96 (!) 175/64  Pulse: (!) 133 (!) 107 85 80  Resp: (!) 26 19 16    Temp: 99.1 F (37.3 C) 99.1 F (37.3 C) 98.9 F (37.2 C) (!) 97.4 F (36.3 C)  TempSrc: Oral Oral Oral Oral  SpO2: 92% 98% 100% 97%  Weight:      Height:       SpO2: 97 % O2 Flow Rate (L/min): 3 L/min   Intake/Output Summary (Last 24 hours) at 05/19/2024 0615 Last data filed at 05/19/2024 0530 Gross per 24 hour  Intake --  Output 575 ml  Net -575 ml   Filed Weights   05/18/24 1325  Weight: 51.6 kg    Exam: General exam: In no acute distress. Respiratory system: Poor air movement bilaterally is not able to speak in full sentences, has crackles mildly on the right but also present on the left Cardiovascular system: S1 &  S2 heard, RRR. No JVD.  Gastrointestinal system: Abdomen is nondistended, soft and nontender.  Extremities: No pedal edema. Skin: No rashes, lesions or ulcers Psychiatry: Judgement and insight appear normal. Mood & affect appropriate.    Data Reviewed:    Labs: Basic Metabolic Panel: Recent Labs  Lab 05/18/24 0206 05/18/24 0208 05/18/24 0742  NA 136  136 136  --   K 4.3  4.3 4.3  --   CL 103 105  --   CO2  --  20*  --   GLUCOSE 101* 109*  --   BUN 15 14  --   CREATININE 0.90 1.00 0.86  CALCIUM   --  7.6*  --    GFR Estimated Creatinine Clearance: 49.2 mL/min (by C-G  formula based on SCr of 0.86 mg/dL). Liver Function Tests: Recent Labs  Lab 05/18/24 0208  AST 35  ALT 25  ALKPHOS 72  BILITOT 1.2  PROT 5.9*  ALBUMIN 3.0*   No results for input(s): LIPASE, AMYLASE in the last 168 hours. No results for input(s): AMMONIA in the last 168 hours. Coagulation profile Recent Labs  Lab 05/18/24 0208  INR 1.5*   COVID-19 Labs  No results for input(s): DDIMER, FERRITIN, LDH, CRP in the last 72 hours.  Lab Results  Component Value Date   SARSCOV2NAA NEGATIVE 03/17/2024    CBC: Recent Labs  Lab 05/18/24 0206 05/18/24 0435 05/18/24 0742  WBC  --  22.9* 13.7*  NEUTROABS  --  19.3*  --   HGB 9.5*  8.8* 9.3* 8.4*  HCT 28.0*  26.0* 29.2* 27.2*  MCV  --  104.3* 104.2*  PLT  --  357 220   Cardiac Enzymes: No results for input(s): CKTOTAL, CKMB, CKMBINDEX, TROPONINI in the last 168 hours. BNP (last 3 results) No results for input(s): PROBNP in the last 8760 hours. CBG: No results for input(s): GLUCAP in the last 168 hours. D-Dimer: No results for input(s): DDIMER in the last 72 hours. Hgb A1c: No results for input(s): HGBA1C in the last 72 hours. Lipid Profile: No results for input(s): CHOL, HDL, LDLCALC, TRIG, CHOLHDL, LDLDIRECT in the last 72 hours. Thyroid  function studies: No results for input(s): TSH, T4TOTAL, T3FREE, THYROIDAB in the last 72 hours.  Invalid input(s): FREET3 Anemia work up: No results for input(s): VITAMINB12, FOLATE, FERRITIN, TIBC, IRON, RETICCTPCT in the last 72 hours. Sepsis Labs: Recent Labs  Lab 05/18/24 0207 05/18/24 0435 05/18/24 0742 05/18/24 1347  PROCALCITON  --   --   --  1.58  WBC  --  22.9* 13.7*  --   LATICACIDVEN 1.5  --   --   --    Microbiology Recent Results (from the past 240 hours)  Blood Culture (routine x 2)     Status: None (Preliminary result)   Collection Time: 05/18/24  1:21 AM   Specimen: BLOOD  Result Value Ref  Range Status   Specimen Description BLOOD LEFT ARM  Final   Special Requests   Final    BOTTLES DRAWN AEROBIC AND ANAEROBIC Blood Culture adequate volume   Culture   Final    NO GROWTH < 12 HOURS Performed at [facility], [address] [place], [place] [address]    Report Status PENDING  Incomplete  Blood Culture (routine x 2)     Status: None (Preliminary result)   Collection Time: 05/18/24  1:26 AM   Specimen: BLOOD RIGHT HAND  Result Value Ref Range Status   Specimen Description BLOOD RIGHT  HAND  Final   Special Requests   Final    BOTTLES DRAWN AEROBIC AND ANAEROBIC Blood Culture adequate volume   Culture   Final    NO GROWTH < 12 HOURS Performed at [facility], [address], [place], [place] [address]    Report Status PENDING  Incomplete     Medications:    apixaban   2.5 mg Oral BID   aspirin   81 mg Oral Daily   atorvastatin   20 mg Oral q AM   azithromycin   500 mg Oral Daily   feeding supplement  237 mL Oral BID BM   fluticasone  furoate-vilanterol  1 puff Inhalation Daily   lidocaine   1 patch Transdermal Once   pantoprazole   40 mg Oral Q0600   sertraline   75 mg Oral Daily   Continuous Infusions:  cefTRIAXone  (ROCEPHIN )  IV        LOS: 0 days   Erle Odell Castor  [facility]  05/19/2024, 6:15 AM

## 2024-05-19 NOTE — Progress Notes (Signed)
 CSW spoke with pt in room, unclear how oriented pt is.  Pt able to answer questions but also sitting in the bed unclothed. Pt states he is from home with his wife, discussed PT recommendation for SNF and pt adamantly against this.  Per PT note, pt non ambulatory at baseline, however pt states he walks just fine.  Message left with pt wife to discuss DC plan.   Cathlyn Ferry, MSW, LCSW 7/8/20253:25 PM

## 2024-05-19 NOTE — Progress Notes (Signed)
 Respiratory therapist on unit to assess pt for further tx as  needed.  Pt stable at this time. No further interventions needed at this time per RT.

## 2024-05-19 NOTE — NC FL2 (Signed)
 [place]  MEDICAID FL2 LEVEL OF CARE FORM     IDENTIFICATION  Patient Name: Thomas Ferrell Birthdate: [birth year] Sex: male Admission Date (Current Location): 05/18/2024  [facility] and [place] Number:  Producer, television/film/video and Address:  The [facility], [address] [place], [place] [address]      Provider Number: [id number]  Attending Physician Name and Address:  Odell Celinda Balo, MD  Relative Name and Phone Number:  Arsal, Tappan [phone]    Current Level of Care: Hospital Recommended Level of Care: Skilled Nursing Facility Prior Approval Number:    Date Approved/Denied:   PASRR Number: [id number]  Discharge Plan: SNF    Current Diagnoses: Patient Active Problem List   Diagnosis Date Noted   PNA (pneumonia) 05/19/2024   Pressure injury of skin 05/19/2024   Malnutrition of moderate degree 05/19/2024   Fall 05/18/2024   FTT (failure to thrive) in adult 05/18/2024   Atherosclerosis of coronary artery of native heart without angina pectoris 04/27/2024   Aortic atherosclerosis (HCC) 04/27/2024   Chronic heart failure with preserved ejection fraction (HCC) 04/27/2024   Atrial fibrillation (HCC) 03/18/2024   New onset a-fib (HCC) 03/17/2024   Acute heart failure (HCC) 03/17/2024   COPD with acute exacerbation (HCC) 03/17/2024   Hypokalemia 03/17/2024   Essential hypertension 03/17/2024   HLD (hyperlipidemia) 03/17/2024   Anemia 03/17/2024   Cholelithiasis with acute cholecystitis 06/06/2016   Abdominal pain    Common bile duct (CBD) obstruction    CAP (community acquired pneumonia) 06/02/2016   Aftercare following surgery of the circulatory system 10/21/2014   Drainage from wound 10/21/2014   Atherosclerotic PVD with ulceration (HCC) 09/01/2014   Ulcer of lower limb (HCC) 12/31/2013   PAD (peripheral artery disease) (HCC) 12/09/2013   Atherosclerosis of native arteries of the extremities with ulceration (HCC) 12/03/2013    Abnormality of gait 03/03/2013   Lumbago 03/03/2013    Orientation RESPIRATION BLADDER Height & Weight     Self, Place  O2 Continent Weight: 113 lb 12.1 oz (51.6 kg) Height:  5' 4 (162.6 cm)  BEHAVIORAL SYMPTOMS/MOOD NEUROLOGICAL BOWEL NUTRITION STATUS      Continent Diet (see discharge summary)  AMBULATORY STATUS COMMUNICATION OF NEEDS Skin   Total Care Verbally Skin abrasions, Other (Comment) (ecchymosis)                       Personal Care Assistance Level of Assistance  Bathing, Feeding, Dressing Bathing Assistance: Maximum assistance Feeding assistance: Limited assistance Dressing Assistance: Maximum assistance     Functional Limitations Info  Sight, Hearing, Speech Sight Info: Adequate Hearing Info: Adequate Speech Info: Adequate    SPECIAL CARE FACTORS FREQUENCY  PT (By licensed PT), OT (By licensed OT)     PT Frequency: 5x week OT Frequency: 5x week            Contractures Contractures Info: Not present    Additional Factors Info  Code Status, Allergies Code Status Info: full Allergies Info: Hydrocodone , Morphine  And Codeine           Current Medications (05/19/2024):  This is the current hospital active medication list Current Facility-Administered Medications  Medication Dose Route Frequency Provider Last Rate Last Admin   acetaminophen  (TYLENOL ) tablet 650 mg  650 mg Oral Q6H PRN Moore, Willie, MD   650 mg at 05/18/24 2027   apixaban  (ELIQUIS ) tablet 2.5 mg  2.5 mg Oral BID Moore, Willie, MD   2.5 mg at 05/19/24  9071   atorvastatin  (LIPITOR) tablet 20 mg  20 mg Oral q AM Georgina Basket, MD   20 mg at 05/19/24 9072   cefTRIAXone  (ROCEPHIN ) 1 g in sodium chloride  0.9 % 100 mL IVPB  1 g Intravenous Q24H Moore, Willie, MD 200 mL/hr at 05/19/24 0957 1 g at 05/19/24 0957   doxycycline  (VIBRA -TABS) tablet 100 mg  100 mg Oral Q12H Odell Celinda Balo, MD   100 mg at 05/19/24 9072   feeding supplement (ENSURE PLUS HIGH PROTEIN) liquid 237 mL  237 mL  Oral BID BM Georgina Basket, MD   237 mL at 05/19/24 1324   fluticasone  furoate-vilanterol (BREO ELLIPTA ) 100-25 MCG/ACT 1 puff  1 puff Inhalation Daily Georgina Basket, MD   1 puff at 05/19/24 0928   ipratropium-albuterol  (DUONEB) 0.5-2.5 (3) MG/3ML nebulizer solution 3 mL  3 mL Nebulization Q6H PRN Georgina Basket, MD   3 mL at 05/19/24 0258   irbesartan  (AVAPRO ) tablet 150 mg  150 mg Oral Daily Odell Celinda Balo, MD   150 mg at 05/19/24 9071   ketotifen  (ZADITOR ) 0.035 % ophthalmic solution 1 drop  1 drop Both Eyes Daily PRN Georgina Basket, MD       metoprolol  tartrate (LOPRESSOR ) tablet 12.5 mg  12.5 mg Oral BID Odell Celinda Balo, MD   12.5 mg at 05/19/24 9072   Oral care mouth rinse  15 mL Mouth Rinse PRN Georgina Basket, MD       pantoprazole  (PROTONIX ) EC tablet 40 mg  40 mg Oral Q0600 Georgina Basket, MD   40 mg at 05/19/24 0530   sertraline  (ZOLOFT ) tablet 75 mg  75 mg Oral Daily Georgina Basket, MD   75 mg at 05/19/24 9072   spironolactone  (ALDACTONE ) tablet 25 mg  25 mg Oral Daily Odell Celinda Balo, MD   25 mg at 05/19/24 9072     Discharge Medications: Please see discharge summary for a list of discharge medications.  Relevant Imaging Results:  Relevant Lab Results:   Additional Information SSN: [id number]  Bridget Cordella Simmonds, LCSW

## 2024-05-19 NOTE — Progress Notes (Signed)
 Initial Nutrition Assessment  DOCUMENTATION CODES:   Non-severe (moderate) malnutrition in context of chronic illness (COPD, atrial fibrilation, decreased functional status related to illness)  INTERVENTION:  MD ordered 48 hour calorie count: 7/8-7/9 Please hang calorie count envelope on the patient's door. Document percent consumed for each item on the patient's meal tray ticket and keep in envelope. Also document percent of any supplement or snack pt consumes and keep documentation in envelope for RD to review.  Ordered snacks BID to promote intake that helps meet pt's calorie and protein needs and mimics small, frequent meals Ensure Plus High Protein po BID, each supplement provides 350 kcal and 20 grams of protein. Changed ordering status to not appropriate for room service to ensure pt does not miss any meals Magic cup TID with meals, each supplement provides 290 kcal and 9 grams of protein  NUTRITION DIAGNOSIS:   Moderate Malnutrition related to chronic illness (COPD, atrial fibrilation, decreased functional status related to illness) as evidenced by moderate muscle depletion, moderate fat depletion, energy intake < 75% for > or equal to 1 month.  GOAL:   Patient will meet greater than or equal to 90% of their needs  MONITOR:   PO intake, Supplement acceptance  REASON FOR ASSESSMENT:   Consult Assessment of nutrition requirement/status, Calorie Count  ASSESSMENT:   Pt with hx HTN, prior CVA, COPD, PAD, and atrial fibrillation. Hx of bilateral femoral bypass graft (2015), cholecystectomy (2017), and peripheral vascular balloon angioplasty (2024). Pt brought in by EMS after fall and admitted with FTT acute on chronic respiratory failure w/ pna.  Pt started on antibiotic regimen. MD ordered B12 and folate labs for macrocytic anemia.   Spoke with pt and pt's wife. Pt reports low appetite and not feeling like he can eat very much at a time. Pt reports eating some of his  breakfast which only included toast jam, and juice. Discussed importance of ordering more food at each meal and always including protein in each meal, pt expressed understanding. Pt missed meal previously, changed ordering status to not room service appropriate to avoid missing any further meals. Pt reports yesterday he ate half of a [place] sandwich that was delivered to him and he enjoyed that. Discussed ordering snacks in between meals to mimic small frequent meals and promote increased intake to meet calorie and protein needs. Pt reports enjoying ice cream and is willing to try magic cups while admitted. Pt also agreeable to try Ensure shakes, will provide coupons for Ensure when discharged.  PTA, pt's wife reports pt would only eat bites of food 3x per day and would not usually snack in between. Pt would try to eat a variety of foods but always felt fatigued and full before finishing a meal. Pt did not drink any ONS PTA.   Pt reports strength declining recently and reports needing increased help with mobility.   Pt reports no recent wt loss, but has definitely felt decline in muscle function. Per chart review, pt has lost 4% wt in 3 months which is not clinically significant but does put pt at risk for further wt loss. Nutrition focused physical exam shows moderate fat depletions and moderate to severe muscle depletions, indicative of malnutrition.   Medications reviewed and include:  Doxycycline  Protonix  Zoloft  Ceftriaxone   Labs reviewed:  Folate 13.7 Vitamin B12 1360 Hgb 8.4  NUTRITION - FOCUSED PHYSICAL EXAM:  Flowsheet Row Most Recent Value  Orbital Region Moderate depletion  Upper Arm Region Moderate depletion  Thoracic and  Lumbar Region Moderate depletion  Buccal Region Moderate depletion  Temple Region Severe depletion  Clavicle Bone Region Moderate depletion  Clavicle and Acromion Bone Region Moderate depletion  Scapular Bone Region Severe depletion  Dorsal Hand Moderate  depletion  Patellar Region Moderate depletion  Anterior Thigh Region Moderate depletion  Posterior Calf Region Moderate depletion  Edema (RD Assessment) None  Hair Reviewed  [dry patchy skin on head]  Eyes Reviewed  Mouth Reviewed  Skin Reviewed  [bruising on bilateral arms, dry patchy skin]  Nails Reviewed   Diet Order:   Diet Order             Diet regular Room service appropriate? Yes; Fluid consistency: Thin  Diet effective now                   EDUCATION NEEDS:   Education needs have been addressed  Skin:  Skin Assessment: Skin Integrity Issues: Skin Integrity Issues:: Stage I Stage I: coccyx  Last BM:  7/7 type 7  Height:   Ht Readings from Last 1 Encounters:  05/18/24 5' 4 (1.626 m)   Weight:   Wt Readings from Last 1 Encounters:  05/18/24 51.6 kg    Ideal Body Weight:  59 kg  BMI:  Body mass index is 19.53 kg/m.  Estimated Nutritional Needs:   Kcal:  1500-1700  Protein:  65-80g  Fluid:  1.5-1.7L  Josette Glance, MS, RDN, LDN Clinical Dietitian I Please reach out via secure chat

## 2024-05-19 NOTE — Progress Notes (Signed)
 Pt woke up from nap and started to remove equipment including PIV on right arm. Bleeding occurred, held pressure x7 min and placed pressure dressing. Reoriented pt and education given.

## 2024-05-20 ENCOUNTER — Inpatient Hospital Stay (HOSPITAL_COMMUNITY)

## 2024-05-20 DIAGNOSIS — J9601 Acute respiratory failure with hypoxia: Secondary | ICD-10-CM | POA: Diagnosis not present

## 2024-05-20 LAB — CBC WITH DIFFERENTIAL/PLATELET
Abs Immature Granulocytes: 0.08 K/uL — ABNORMAL HIGH (ref 0.00–0.07)
Basophils Absolute: 0 K/uL (ref 0.0–0.1)
Basophils Relative: 0 %
Eosinophils Absolute: 0 K/uL (ref 0.0–0.5)
Eosinophils Relative: 0 %
HCT: 27.1 % — ABNORMAL LOW (ref 39.0–52.0)
Hemoglobin: 8.8 g/dL — ABNORMAL LOW (ref 13.0–17.0)
Immature Granulocytes: 1 %
Lymphocytes Relative: 2 %
Lymphs Abs: 0.2 K/uL — ABNORMAL LOW (ref 0.7–4.0)
MCH: 32.2 pg (ref 26.0–34.0)
MCHC: 32.5 g/dL (ref 30.0–36.0)
MCV: 99.3 fL (ref 80.0–100.0)
Monocytes Absolute: 0.5 K/uL (ref 0.1–1.0)
Monocytes Relative: 4 %
Neutro Abs: 10.8 K/uL — ABNORMAL HIGH (ref 1.7–7.7)
Neutrophils Relative %: 93 %
Platelets: 244 K/uL (ref 150–400)
RBC: 2.73 MIL/uL — ABNORMAL LOW (ref 4.22–5.81)
RDW: 17.2 % — ABNORMAL HIGH (ref 11.5–15.5)
WBC: 11.6 K/uL — ABNORMAL HIGH (ref 4.0–10.5)
nRBC: 0 % (ref 0.0–0.2)

## 2024-05-20 LAB — BRAIN NATRIURETIC PEPTIDE: B Natriuretic Peptide: 1742.6 pg/mL — ABNORMAL HIGH (ref 0.0–100.0)

## 2024-05-20 LAB — COMPREHENSIVE METABOLIC PANEL WITH GFR
ALT: 33 U/L (ref 0–44)
AST: 43 U/L — ABNORMAL HIGH (ref 15–41)
Albumin: 2.5 g/dL — ABNORMAL LOW (ref 3.5–5.0)
Alkaline Phosphatase: 63 U/L (ref 38–126)
Anion gap: 11 (ref 5–15)
BUN: 11 mg/dL (ref 8–23)
CO2: 22 mmol/L (ref 22–32)
Calcium: 8.3 mg/dL — ABNORMAL LOW (ref 8.9–10.3)
Chloride: 105 mmol/L (ref 98–111)
Creatinine, Ser: 0.73 mg/dL (ref 0.61–1.24)
GFR, Estimated: >60 mL/min (ref >60)
Glucose, Bld: 166 mg/dL — ABNORMAL HIGH (ref 70–99)
Potassium: 3.3 mmol/L — ABNORMAL LOW (ref 3.5–5.1)
Sodium: 138 mmol/L (ref 135–145)
Total Bilirubin: 1 mg/dL (ref 0.0–1.2)
Total Protein: 5.3 g/dL — ABNORMAL LOW (ref 6.5–8.1)

## 2024-05-20 MED ORDER — POTASSIUM CHLORIDE CRYS ER 20 MEQ PO TBCR
40.0000 meq | EXTENDED_RELEASE_TABLET | ORAL | Status: AC
  Administered 2024-05-20 (×2): 40 meq via ORAL
  Filled 2024-05-20 (×2): qty 2

## 2024-05-20 MED ORDER — ALBUTEROL SULFATE (2.5 MG/3ML) 0.083% IN NEBU
2.5000 mg | INHALATION_SOLUTION | RESPIRATORY_TRACT | Status: DC | PRN
  Administered 2024-05-20: 2.5 mg via RESPIRATORY_TRACT
  Filled 2024-05-20: qty 3

## 2024-05-20 MED ORDER — IPRATROPIUM-ALBUTEROL 0.5-2.5 (3) MG/3ML IN SOLN
3.0000 mL | Freq: Two times a day (BID) | RESPIRATORY_TRACT | Status: DC
  Administered 2024-05-20 – 2024-05-23 (×7): 3 mL via RESPIRATORY_TRACT
  Filled 2024-05-20 (×8): qty 3

## 2024-05-20 MED ORDER — FUROSEMIDE 10 MG/ML IJ SOLN
40.0000 mg | Freq: Every day | INTRAMUSCULAR | Status: DC
  Administered 2024-05-21 – 2024-05-22 (×2): 40 mg via INTRAVENOUS
  Filled 2024-05-20 (×2): qty 4

## 2024-05-20 MED ORDER — FUROSEMIDE 10 MG/ML IJ SOLN
40.0000 mg | Freq: Once | INTRAMUSCULAR | Status: AC
  Administered 2024-05-20: 40 mg via INTRAVENOUS
  Filled 2024-05-20: qty 4

## 2024-05-20 NOTE — Evaluation (Signed)
 Clinical/Bedside Swallow Evaluation Patient Details  Name: Thomas Ferrell MRN: [id number] Date of Birth: 07-19-1941  Today's Date: 05/20/2024 Time: SLP Start Time (ACUTE ONLY): 1120 SLP Stop Time (ACUTE ONLY): 1130 SLP Time Calculation (min) (ACUTE ONLY): 10 min  Past Medical History:  Past Medical History:  Diagnosis Date   Abnormality of gait 03/03/2013   bladder ca dx'd 11/2009   chemo/xrt comp 02/2010   Cerebrovascular disease    right brain CVA 2007   COPD (chronic obstructive pulmonary disease) (HCC)    Cough productive of clear sputum    TX RECENT SINUS INFECTION    CVA (cerebral vascular accident) (HCC) 2007   r-cva   Dyslipidemia    Hypertension    Lumbago    Past Surgical History:  Past Surgical History:  Procedure Laterality Date   ABDOMINAL AORTAGRAM N/A 12/07/2013   Procedure: ABDOMINAL EZELLA;  Surgeon: Lonni GORMAN Blade, MD;  Location: [facility] CATH LAB;  Service: Cardiovascular;  Laterality: N/A;   ABDOMINAL AORTOGRAM W/LOWER EXTREMITY N/A 06/26/2023   Procedure: ABDOMINAL AORTOGRAM W/LOWER EXTREMITY;  Surgeon: Lanis Fonda BRAVO, MD;  Location: [facility] INVASIVE CV LAB;  Service: Cardiovascular;  Laterality: N/A;   BACK SURGERY  2010   bladder cancer     CAROTID ARTERY ANGIOPLASTY  2009   CHOLECYSTECTOMY N/A 06/06/2016   Procedure: LAPAROSCOPIC CHOLECYSTECTOMY WITH INTRAOPERATIVE CHOLANGIOGRAM;  Surgeon: Krystal Spinner, MD;  Location: [facility] ORS;  Service: General;  Laterality: N/A;   ERCP N/A 06/05/2016   Procedure: ENDOSCOPIC RETROGRADE CHOLANGIOPANCREATOGRAPHY (ERCP);  Surgeon: Norleen Hint, MD;  Location: [facility];  Service: Endoscopy;  Laterality: N/A;   ESOPHAGOGASTRODUODENOSCOPY (EGD) WITH PROPOFOL  N/A 06/05/2016   Procedure: ESOPHAGOGASTRODUODENOSCOPY (EGD);  Surgeon: Norleen Hint, MD;  Location: [facility];  Service: Endoscopy;  Laterality: N/A;   FEMORAL-FEMORAL BYPASS GRAFT N/A 12/09/2013   Procedure: BYPASS GRAFT FEMORAL-FEMORAL ARTERY WITH BILATERAL ENDARTERECTOMY ;   Surgeon: Carlin BRAVO Haddock, MD;  Location: [facility] OR;  Service: Vascular;  Laterality: N/A;   FEMORAL-POPLITEAL BYPASS GRAFT Left 12/09/2013   Procedure: BYPASS GRAFT FEMORAL-POPLITEAL ARTERY - LEFT ;  Surgeon: Carlin BRAVO Haddock, MD;  Location: [facility] OR;  Service: Vascular;  Laterality: Left;   LAMINECTOMY  09/08/2013   L 2 L3 L4 L5       Dr Amon   LOWER EXTREMITY ANGIOGRAPHY Right 07/03/2023   Procedure: Lower Extremity Angiography;  Surgeon: Lanis Fonda BRAVO, MD;  Location: [facility] INVASIVE CV LAB;  Service: Cardiovascular;  Laterality: Right;   PERIPHERAL VASCULAR BALLOON ANGIOPLASTY Right 07/03/2023   Procedure: PERIPHERAL VASCULAR BALLOON ANGIOPLASTY;  Surgeon: Lanis Fonda BRAVO, MD;  Location: [facility] INVASIVE CV LAB;  Service: Cardiovascular;  Laterality: Right;  R external iliac   HPI:  Pt is an 83 y/o male who presents to [facility] 05/18/24 post ground level fall. CXR shows Progressive superimposed left lower lobe airspace disease is consistent with  pneumonia or aspiration pneumonitis. Pt has been see in prior visits for chronic coughing. Has not had any instrumental testing.  PMH significant for PAD, carotid artery stenosis, HTN, prior history of tobacco abuse, stroke/TIA, COPD, anemia.  Recent hospitalization 5/25 for SOB and COPD.    Assessment / Plan / Recommendation  Clinical Impression  Very brief assessment as pt a somewhat reluctant participant. SLP repositioned pt, reviewed history with pt. He denies dysphagia, but does agree that he has frequent coughing and is aware of CXR result. WHen observed drinking, pt takes only a few small sips with one significant cough. Otherwise no coughing during visit.  Given multiple SLP consults for possible aspiration pna and no instrumental assessments, will attempt MBS. Pt can continue regular diet and thin liquids. SLP Visit Diagnosis: Dysphagia, unspecified (R13.10)    Aspiration Risk  Mild aspiration risk    Diet Recommendation Regular;Thin liquid         Other   Recommendations Oral Care Recommendations: Oral care BID     Assistance Recommended at Discharge    Functional Status Assessment    Frequency and Duration            Prognosis        Swallow Study   General HPI: Pt is an 83 y/o male who presents to [facility] 05/18/24 post ground level fall. CXR shows Progressive superimposed left lower lobe airspace disease is consistent with  pneumonia or aspiration pneumonitis. Pt has been see in prior visits for chronic coughing. Has not had any instrumental testing.  PMH significant for PAD, carotid artery stenosis, HTN, prior history of tobacco abuse, stroke/TIA, COPD, anemia.  Recent hospitalization 5/25 for SOB and COPD. Type of Study: Bedside Swallow Evaluation Previous Swallow Assessment: several prior BSE Diet Prior to this Study: Regular;Thin liquids (Level 0) Temperature Spikes Noted: No Respiratory Status: Nasal cannula History of Recent Intubation: No Behavior/Cognition: Alert;Cooperative Oral Cavity Assessment: Within Functional Limits Oral Care Completed by SLP: No Oral Cavity - Dentition: Adequate natural dentition Vision: Functional for self-feeding Self-Feeding Abilities: Able to feed self Patient Positioning: Upright in bed Baseline Vocal Quality: Normal Volitional Cough: Strong Volitional Swallow: Able to elicit    Oral/Motor/Sensory Function Overall Oral Motor/Sensory Function: Within functional limits   Ice Chips     Thin Liquid Thin Liquid: Impaired Presentation: Straw Pharyngeal  Phase Impairments: Cough - Immediate    Nectar Thick Nectar Thick Liquid: Not tested   Honey Thick Honey Thick Liquid: Not tested   Puree Puree: Not tested   Solid     Solid: Not tested      Ajit Errico, Consuelo Fitch 05/20/2024,11:44 AM

## 2024-05-20 NOTE — Plan of Care (Signed)
 Patient with intermittent confusion and restlessness but easily reoriented. Patient would removed nasal cannula and tele leads upon waking up from nap.  Pt remained on 3L overnight with sats on high 90's. Pt also had several BMs overnight.   Problem: Clinical Measurements: Goal: Ability to maintain clinical measurements within normal limits will improve Outcome: Progressing   Problem: Coping: Goal: Level of anxiety will decrease Outcome: Progressing   Problem: Elimination: Goal: Will not experience complications related to bowel motility Outcome: Progressing   Problem: Safety: Goal: Ability to remain free from injury will improve Outcome: Progressing

## 2024-05-20 NOTE — Progress Notes (Signed)
 Calorie Count Note  48 hour calorie count ordered.  Diet: regular Supplements: Ensure Plus High Protein, Magic Cups  Day 1: 7/8 intake Breakfast: observed breakfast and pt only ate a few bites of toast and jam with juice (approximately 31 calories) Lunch: observed lunch and pt had not taken any bites of lunch tray (approximately 0 calories) Dinner: no documentation provided for meal, cannot estimate intake; dietary reported pt had refused first tray sent Supplements: no documentation on any supplements given  Total intake: 31 kcal (2% of minimum estimated needs)  0 protein (0% of minimum estimated needs)  Suspect pt may have taken bites at each meal, but without documentation cannot prove. Will continue to monitor intake 7/9 and see if there are any improvements. Per pt's wife, bites at meals tends to be pt's baseline intake. Pt is at risk for further malnutrition and complications of malnutrition. Discussed importance of calorie count with RN and requested documentation of supplement intake for 7/9 to obtain better picture of intake.  Nutrition Dx: Moderate Malnutrition related to chronic illness (COPD, atrial fibrilation, decreased functional status related to illness) as evidenced by moderate muscle depletion, moderate fat depletion, energy intake < 75% for > or equal to 1 month.  Remains applicable  Goal: Patient will meet greater than or equal to 90% of their needs  Currently Unmet  Intervention:   48 hour calorie count: 7/8-7/9 Please hang calorie count envelope on the patient's door. Document percent consumed for each item on the patient's meal tray ticket and keep in envelope. Also document percent of any supplement or snack pt consumes and keep documentation in envelope for RD to review.  Ordered snacks BID to promote intake that helps meet pt's calorie and protein needs and mimics small, frequent meals Ensure Plus High Protein po BID, each supplement provides 350 kcal and 20  grams of protein. Changed ordering status to not appropriate for room service to ensure pt does not miss any meals Magic cup TID with meals, each supplement provides 290 kcal and 9 grams of protein  Josette Glance, MS, RDN, LDN Clinical Dietitian I Please reach out via secure chat

## 2024-05-20 NOTE — Progress Notes (Signed)

## 2024-05-20 NOTE — Progress Notes (Signed)
 Physical Therapy Treatment Patient Details Name: Thomas Ferrell MRN: [id number] DOB: [birth year] Today's Date: 05/20/2024   History of Present Illness Pt is an 83 y/o male who presents to [facility] 05/18/24 post ground level fall.  PMH significant for PAD, carotid artery stenosis, HTN, prior history of tobacco abuse, stroke/TIA, COPD, anemia.  Recent hospitalization 5/25 for SOB and COPD.    PT Comments  Received pt semi-reclined in bed with wife at bedside. Pt unable to get comfortable and agreeable to repositioning OOB. Pt on 4L with SPO2 88-90% at rest. Pt transferred semi-reclined<>sitting EOB with HOB elevated and use of bedrails with mod A for trunk control and cues for sequencing. Scooted to EOB without assist, then attempted to void in urinal with assist from wife but ultimately unable. SPO2 dropping to 81% and unable to recover - therefore increased to 5L with mobility and SPO2 increased to >88%. Stood in [place] from [place] with mod A and transferred to recliner dependently - stood from [place] flaps with min A but pt lacking full trunk/hip extension despite cues for posture. Assisted pt with scooting hips back in recliner and left seated in recliner, needs within reach, and chair pad alarm on with wife at bedside. Decreased back to 4L and SPO2 88-90% at rest - pt asymptomatic. Continue to recommend PT services in less intense setting <3 hours/day to address current impairments.     If plan is discharge home, recommend the following: A lot of help with walking and/or transfers;A lot of help with bathing/dressing/bathroom;Assistance with cooking/housework;Assist for transportation;Help with stairs or ramp for entrance   Can travel by private vehicle     No  Equipment Recommendations  None recommended by PT (TBD in next venue)    Recommendations for Other Services       Precautions / Restrictions Precautions Precautions: Fall Precaution/Restrictions Comments: Very anxious and  restless Restrictions Weight Bearing Restrictions Per Provider Order: No     Mobility  Bed Mobility Overal bed mobility: Needs Assistance Bed Mobility: Supine to Sit, Sit to Supine     Supine to sit: Mod assist, HOB elevated, Used rails     General bed mobility comments: assist for trunk control and cues to scoot to EOB to get feet on floor - pt quick to ask wife for assist before attempting himself. Patient Response: Anxious  Transfers Overall transfer level: Needs assistance   Transfers: Sit to/from Stand, Bed to chair/wheelchair/BSC Sit to Stand: Via lift equipment, Mod assist           General transfer comment: Stood from EOB in [place] with mod A and transferred to recliner. Stood from [employer] flaps with min A with cues for upright posture and hip extension. Transfer via Lift Equipment: Stedy  Ambulation/Gait               General Gait Details: unable   Stairs             Wheelchair Mobility     Tilt Bed Tilt Bed Patient Response: Anxious  Modified Rankin (Stroke Patients Only)       Balance Overall balance assessment: Needs assistance Sitting-balance support: Feet supported Sitting balance-Leahy Scale: Fair Sitting balance - Comments: able to maintain static sitting balance with close supervision with heavy reliance on BUE support Postural control: Posterior lean Standing balance support: Bilateral upper extremity supported, Reliant on assistive device for balance Standing balance-Leahy Scale: Zero  Communication Communication Communication: Impaired Factors Affecting Communication: Difficulty expressing self  Cognition Arousal: Alert Behavior During Therapy: Anxious                                    Cueing Cueing Techniques: Verbal cues, Tactile cues  Exercises      General Comments General comments (skin integrity, edema, etc.): Pt on 4L initally with SPO2 88-90%, decreased  to 81% with mobility and required 5L to maintain sats >88%, then returned to 4L upon sitting in recliner to rest and SPO2 88-90% and improved with pursed lip breathing.      Pertinent Vitals/Pain Pain Assessment Pain Assessment: No/denies pain    Home Living                          Prior Function            PT Goals (current goals can now be found in the care plan section) Acute Rehab PT Goals Patient Stated Goal: Feel better PT Goal Formulation: With patient/family Time For Goal Achievement: 06/02/24 Potential to Achieve Goals: Fair Progress towards PT goals: Progressing toward goals    Frequency    Min 2X/week      PT Plan      Co-evaluation              AM-PAC PT 6 Clicks Mobility   Outcome Measure  Help needed turning from your back to your side while in a flat bed without using bedrails?: A Lot Help needed moving from lying on your back to sitting on the side of a flat bed without using bedrails?: A Lot Help needed moving to and from a bed to a chair (including a wheelchair)?: Total Help needed standing up from a chair using your arms (e.g., wheelchair or bedside chair)?: A Lot Help needed to walk in hospital room?: Total Help needed climbing 3-5 steps with a railing? : Total 6 Click Score: 9    End of Session Equipment Utilized During Treatment: Gait belt Activity Tolerance: Patient tolerated treatment well Patient left: with call bell/phone within reach;with family/visitor present;in chair;with chair alarm set Nurse Communication: Mobility status PT Visit Diagnosis: Unsteadiness on feet (R26.81);Difficulty in walking, not elsewhere classified (R26.2);Muscle weakness (generalized) (M62.81)     Time: 9164-9141 PT Time Calculation (min) (ACUTE ONLY): 23 min  Charges:    $Therapeutic Activity: 23-37 mins PT General Charges $$ ACUTE PT VISIT: 1 Visit                     Therisa HERO Zaunegger Therisa Stains PT, DPT 05/20/2024, 9:07  AM

## 2024-05-20 NOTE — Progress Notes (Signed)
 Patient woke up from a nap panicking saying he couldn't breathe. Oxygen  sats were 80% on 3L [place] with a good wave form. Patient sat in up right position and lung sounds revealed diminished with wheezes. Oxygen  increased to 5L [place] and MD made aware. CXR ordered. RT called for breathing treatment, this RN started breathing treatment d/t patient being severely short of breath.

## 2024-05-20 NOTE — TOC Progression Note (Addendum)
 Transition of Care [facility]) - Progression Note    Patient Details  Name: ATIBA KIMBERLIN MRN: [id number] Date of Birth: Jul 22, 1941  [facility] of [facility]) CM/SW Contact  Bridget Cordella Simmonds, LCSW Phone Number: 05/20/2024, 8:28 AM  Clinical Narrative:   Bed offers provided to pt and to wife Avelina in room on medicare choice document.  They will review.    0930: ROYCE Avelina: they want to accept offer at [facility].  CSW message with [facility]: unsure on bed status currently.  Per MD, still not stable for DC.  TOC will continue to follow.    Expected Discharge Plan: Skilled Nursing Facility Barriers to Discharge: Continued Medical Work up, SNF Pending bed offer  Expected Discharge Plan and Services In-house Referral: Clinical Social Work   Post Acute Care Choice: Skilled Nursing Facility (per wife) Living arrangements for the past 2 months: Single Family Home                                       Social Determinants of Health (SDOH) Interventions SDOH Screenings   Food Insecurity: No Food Insecurity (05/18/2024)  Housing: Low Risk  (05/18/2024)  Transportation Needs: No Transportation Needs (05/18/2024)  Utilities: Not At Risk (05/18/2024)  Financial Resource Strain: Low Risk  (04/28/2024)  Social Connections: Patient Unable To Answer (05/18/2024)  Recent Concern: Social Connections - Moderately Isolated (03/18/2024)  Tobacco Use: Medium Risk (05/18/2024)  Health Literacy: Adequate Health Literacy (04/28/2024)    Readmission Risk Interventions    03/19/2024    1:30 PM  Readmission Risk Prevention Plan  Post Dischage Appt Complete  Medication Screening Complete  Transportation Screening Complete

## 2024-05-20 NOTE — Progress Notes (Signed)
 Patient transferred to 3E11 for progressive care monitoring with no issues. Wife, Pattie called and made aware.

## 2024-05-20 NOTE — Progress Notes (Signed)
 PT in bed. Restless. Tossing and turning, removing gown and medical equipment. Prn and scheduled breathing treatments already administered with little relief. Pt also noted to be using accessory muscles and SOB. Upon auscultation patient noted to have crackles. Patient maintaining O2 saturation in low 90's. Notified on call provider. See new orders.

## 2024-05-20 NOTE — Progress Notes (Addendum)
 PROGRESS NOTE    Thomas Ferrell  FMW:[id number] DOB: 08/04/1941 DOA: 05/18/2024 PCP: [name], MD  Chief Complaint  Patient presents with   Fall    Brief Narrative:   Thomas Ferrell is an 83 y.o. male past medical history significant for peripheral arterial disease, carotid stenosis status post bilateral common femoral endarterectomy and bypass, essential hypertension, history of TIA, paroxysmal atrial fibrillation on Eliquis  COPD on 2 L of oxygen  at home per patient's report failure to thrive he called EMS as he could not get get up from the floor he did not hit his head.  White blood cell count of 23,000 chest x-ray showed left lower lobe consolidation   Assessment & Plan:   Principal Problem:   Fall Active Problems:   CAP (community acquired pneumonia)   FTT (failure to thrive) in adult   PNA (pneumonia)   Pressure injury of skin   Malnutrition of moderate degree  Acutely worsened SOB today -> requiring NRB.  Rapid response called. ? Some overload with BNP/CXR.  Seems to have improved with lasix .  Will continue workup as noted below.  Transfer to progressive.   Acute Hypoxic Respiratory Failure secondary to Pneumonia Reportedly on 2 L chronically - need to clarify this with his wife CXR at presentation with superimposed LLL airspace disease c/w pneumonia or aspiration pneumonitis Repeat CXR today pending  Ceftriaxone , doxycycline   Urine strep, legionella, MRSA PCR, sputum cx Blood cx NGTD SLP eval with concern for aspiration  Elevated BNP  Concern for Volume Overload Clinically, doesn't appear overloaded, but BNP elevated from prior and worsening hypoxia with abnormal CXR Will diurese as tolerated Echo 03/2024 with EF 60-65%, no RWMA, grade 1 diastolic dysfunction - will repeat limited echo for EF Follow EKG   Acute Metabolic Encephalopathy Per EDP note, hallucinating prior to admission Still confused, will work on better picture of baseline mental status    Paroxysmal atrial fibrillation on Eliquis : Rate controlled metoprolol  Eliquis  on hold  Avoid NSAIDs   Iron Deficiency Anemia Check Thomas Ferrell B12 (elevated) and folate (wnl) Iron is low trend   Physical deconditioning/ Fall: PT OT consulted.  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   Right Forearm Hematoma 9.4 x 4.4 x 2.7 cm hematoma in subcutaneous tissues of proximal dorsal forearm - seems to be improving per discussion with his wife Monitor off anticoagulation   Essential hypertension: Blood pressure is elevated resume home antihypertensive regimen.   Moderate Malnutrition  Noted, RD c/s  Sacral decubitus ulcer present on admission unstageable    DVT prophylaxis: SCD Code Status: full Family Communication: wife over phone Disposition:   Status is: Inpatient Remains inpatient appropriate because: need for ongoing inpatient care   Consultants:  none  Procedures:  none  Antimicrobials:  Anti-infectives (From admission, onward)    Start     Dose/Rate Route Frequency Ordered Stop   05/19/24 1015  doxycycline  (VIBRA -TABS) tablet 100 mg        100 mg Oral Every 12 hours 05/19/24 0916 05/23/24 0959   05/19/24 0800  cefTRIAXone  (ROCEPHIN ) 1 g in sodium chloride  0.9 % 100 mL IVPB        1 g 200 mL/hr over 30 Minutes Intravenous Every 24 hours 05/18/24 1349 05/23/24 0759   05/18/24 1445  azithromycin  (ZITHROMAX ) tablet 500 mg  Status:  Discontinued        500 mg Oral Daily 05/18/24 1349 05/19/24 0916   05/18/24 0615  vancomycin  (VANCOCIN ) IVPB 1000 mg/200 mL premix  1,000 mg 200 mL/hr over 60 Minutes Intravenous  Once 05/18/24 0608 05/18/24 0733   05/18/24 0615  ceFEPIme  (MAXIPIME ) 2 g in sodium chloride  0.9 % 100 mL IVPB        2 g 200 mL/hr over 30 Minutes Intravenous  Once 05/18/24 0608 05/18/24 0656       Subjective: Pleasantly confused Wife over phone  Objective: Vitals:   05/20/24 1339 05/20/24 1406 05/20/24 1500 05/20/24 1508  BP: (!) 165/73   (!)  161/90  Pulse: 99     Resp: 16     Temp: 97.9 F (36.6 C)     TempSrc:      SpO2: 93% (!) 88% (!) 74% 100%  Weight:      Height:        Intake/Output Summary (Last 24 hours) at 05/20/2024 1645 Last data filed at 05/20/2024 1400 Gross per 24 hour  Intake 540 ml  Output 100 ml  Net 440 ml   Filed Weights   05/18/24 1325  Weight: 51.6 kg    Examination:  General exam: Appears calm and comfortable  Respiratory system: bibasilar crackles -> went from 3 L to NRB (circled around on him times) - never in acute distress, but initially comfortable on 3 L, subsequently in no distress on NRB.  After lasix  satting well on 4 L.  Cardiovascular system: RRR Gastrointestinal system: Abdomen is nondistended, soft and nontender. Central nervous system: Alert, but pleasantly confused. No focal neurological deficits. Extremities:  no LEE edema.  No dependent edema.  Has ~ping pong ball sized hematoma on R forearm with second smaller area.  Soft compartments.     Data Reviewed: I have personally reviewed following labs and imaging studies  CBC: Recent Labs  Lab 05/18/24 0206 05/18/24 0435 05/18/24 0742 05/20/24 0819  WBC  --  22.9* 13.7* 11.6*  NEUTROABS  --  19.3*  --  10.8*  HGB 9.5*  8.8* 9.3* 8.4* 8.8*  HCT 28.0*  26.0* 29.2* 27.2* 27.1*  MCV  --  104.3* 104.2* 99.3  PLT  --  357 220 244    Basic Metabolic Panel: Recent Labs  Lab 05/18/24 0206 05/18/24 0208 05/18/24 0742 05/19/24 0640 05/20/24 0819  NA 136  136 136  --  139 138  K 4.3  4.3 4.3  --  3.5 3.3*  CL 103 105  --  111 105  CO2  --  20*  --  18* 22  GLUCOSE 101* 109*  --  113* 166*  BUN 15 14  --  9 11  CREATININE 0.90 1.00 0.86 0.75 0.73  CALCIUM   --  7.6*  --  7.9* 8.3*    GFR: Estimated Creatinine Clearance: 52.9 mL/min (by C-G formula based on SCr of 0.73 mg/dL).  Liver Function Tests: Recent Labs  Lab 05/18/24 0208 05/20/24 0819  AST 35 43*  ALT 25 33  ALKPHOS 72 63  BILITOT 1.2 1.0  PROT  5.9* 5.3*  ALBUMIN 3.0* 2.5*    CBG: No results for input(s): GLUCAP in the last 168 hours.   Recent Results (from the past 240 hours)  Blood Culture (routine x 2)     Status: None (Preliminary result)   Collection Time: 05/18/24  1:21 AM   Specimen: BLOOD  Result Value Ref Range Status   Specimen Description BLOOD LEFT ARM  Final   Special Requests   Final    BOTTLES DRAWN AEROBIC AND ANAEROBIC Blood Culture adequate volume   Culture  Final    NO GROWTH 2 DAYS Performed at [facility], [address] [place], [place] [address]    Report Status PENDING  Incomplete  Blood Culture (routine x 2)     Status: None (Preliminary result)   Collection Time: 05/18/24  1:26 AM   Specimen: BLOOD RIGHT HAND  Result Value Ref Range Status   Specimen Description BLOOD RIGHT HAND  Final   Special Requests   Final    BOTTLES DRAWN AEROBIC AND ANAEROBIC Blood Culture adequate volume   Culture   Final    NO GROWTH 2 DAYS Performed at [facility], [address] [place], [place] [address]    Report Status PENDING  Incomplete         Radiology Studies: CT Extrem Up Entire Arm R W/CM Result Date: 05/19/2024 CLINICAL DATA:  Swelling and pain after fall. EXAM: CT OF THE UPPER RIGHT EXTREMITY WITH CONTRAST TECHNIQUE: Multidetector CT imaging of the upper right extremity was performed according to the standard protocol following intravenous contrast administration. RADIATION DOSE REDUCTION: This exam was performed according to the departmental dose-optimization program which includes automated exposure control, adjustment of the mA and/or kV according to patient size and/or use of iterative reconstruction technique. CONTRAST:  75mL OMNIPAQUE  IOHEXOL  350 MG/ML SOLN COMPARISON:  Right elbow radiographs dated 05/10/2027. FINDINGS: Bones/Joint/Cartilage No acute fracture or dislocation. Advanced arthritic changes of the glenohumeral joint with severe joint space narrowing resulting in  bone-on-bone contact and subchondral cystic changes within the glenoid. Amorphous foci of mineralization within the glenohumeral joint may reflect chondrocalcinosis as can be seen with CPPD arthropathy. The radiocapitellar and ulnotrochlear joints are anatomically aligned with mild joint space narrowing. Arthritic changes of the wrist with radiocarpal joint space narrowing, foci of mineralization within the region of the TFCC suggestive of chondrocalcinosis as can be seen with CPPD arthropathy. Subchondral cystic changes in the scaphoid, lunate and capitate. Ligaments Ligaments are suboptimally evaluated by CT. Muscles and Tendons Visualized muscles are within normal limits for age. No loculated intramuscular fluid collection or hematoma. Soft tissue Within the subcutaneous tissues of the proximal dorsal forearm there is Sophia Cubero 9.4 x 4.4 x 2.7 cm heterogenous hyperdense collection, most compatible with Destyne Goodreau hematoma, extending distally through the mid forearm. The deep margin this hematoma abuts the investing fascia of the underlying extensor musculature. No soft tissue gas. Subcutaneous edema extending along the dorsal elbow and forearm. IMPRESSION: 1. Findings most compatible with Autumn Pruitt 9.4 x 4.4 x 2.7 cm hematoma in the subcutaneous tissues of the proximal dorsal forearm. The deep margin of this hematoma abuts the fascia of the underlying extensor musculature. No loculated intramuscular collection identified. 2. No acute osseous abnormality. 3. 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 4. 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 Electronically Signed   By: Harrietta Sherry M.D.   On: 05/19/2024 08:49        Scheduled Meds:  apixaban   2.5 mg Oral BID   atorvastatin   20 mg Oral q AM   doxycycline   100 mg Oral Q12H   feeding supplement  237 mL Oral BID BM   fluticasone   furoate-vilanterol  1 puff Inhalation Daily   ipratropium-albuterol   3 mL Nebulization BID   irbesartan   150 mg Oral Daily   metoprolol  tartrate  12.5 mg Oral BID   pantoprazole   40 mg Oral Q0600  potassium chloride   40 mEq Oral Q4H   sertraline   75 mg Oral Daily   spironolactone   25 mg Oral Daily   Continuous Infusions:  cefTRIAXone  (ROCEPHIN )  IV Stopped (05/20/24 1111)     LOS: 1 day    Time spent: over 30 min    Meliton Monte, MD [facility]   To contact the attending provider between 7A-7P or the covering provider during after hours 7P-7A, please log into the web site [email or web address] and access using universal Trainer password for that web site. If you do not have the password, please call the hospital operator.  05/20/2024, 4:45 PM

## 2024-05-21 ENCOUNTER — Encounter (HOSPITAL_COMMUNITY)

## 2024-05-21 ENCOUNTER — Inpatient Hospital Stay (HOSPITAL_COMMUNITY)

## 2024-05-21 DIAGNOSIS — J9601 Acute respiratory failure with hypoxia: Secondary | ICD-10-CM | POA: Diagnosis not present

## 2024-05-21 DIAGNOSIS — R008 Other abnormalities of heart beat: Secondary | ICD-10-CM

## 2024-05-21 LAB — BLOOD GAS, VENOUS
Acid-Base Excess: 8.8 mmol/L — ABNORMAL HIGH (ref 0.0–2.0)
Bicarbonate: 33.5 mmol/L — ABNORMAL HIGH (ref 20.0–28.0)
O2 Saturation: 18.4 %
Patient temperature: 36.8
pCO2, Ven: 45 mmHg (ref 44–60)
pH, Ven: 7.48 — ABNORMAL HIGH (ref 7.25–7.43)
pO2, Ven: <31 mmHg — CL (ref 32–45)

## 2024-05-21 LAB — ECHOCARDIOGRAM LIMITED
AR max vel: 1.4 cm2
AV Area VTI: 1.45 cm2
AV Area mean vel: 1.4 cm2
AV Mean grad: 10 mmHg
AV Peak grad: 18.1 mmHg
Ao pk vel: 2.13 m/s
Area-P 1/2: 3.93 cm2
Height: 64 in
MV M vel: 4.82 m/s
MV Peak grad: 92.9 mmHg
S' Lateral: 2.9 cm
Weight: 1689.61 [oz_av]

## 2024-05-21 LAB — COMPREHENSIVE METABOLIC PANEL WITH GFR
ALT: 41 U/L (ref 0–44)
AST: 53 U/L — ABNORMAL HIGH (ref 15–41)
Albumin: 2.9 g/dL — ABNORMAL LOW (ref 3.5–5.0)
Alkaline Phosphatase: 65 U/L (ref 38–126)
Anion gap: 14 (ref 5–15)
BUN: 13 mg/dL (ref 8–23)
CO2: 22 mmol/L (ref 22–32)
Calcium: 8.5 mg/dL — ABNORMAL LOW (ref 8.9–10.3)
Chloride: 102 mmol/L (ref 98–111)
Creatinine, Ser: 0.74 mg/dL (ref 0.61–1.24)
GFR, Estimated: >60 mL/min (ref >60)
Glucose, Bld: 130 mg/dL — ABNORMAL HIGH (ref 70–99)
Potassium: 4.2 mmol/L (ref 3.5–5.1)
Sodium: 138 mmol/L (ref 135–145)
Total Bilirubin: 0.9 mg/dL (ref 0.0–1.2)
Total Protein: 6.1 g/dL — ABNORMAL LOW (ref 6.5–8.1)

## 2024-05-21 LAB — CBC WITH DIFFERENTIAL/PLATELET
Abs Immature Granulocytes: 0.13 K/uL — ABNORMAL HIGH (ref 0.00–0.07)
Basophils Absolute: 0 K/uL (ref 0.0–0.1)
Basophils Relative: 0 %
Eosinophils Absolute: 0 K/uL (ref 0.0–0.5)
Eosinophils Relative: 0 %
HCT: 29.8 % — ABNORMAL LOW (ref 39.0–52.0)
Hemoglobin: 9.7 g/dL — ABNORMAL LOW (ref 13.0–17.0)
Immature Granulocytes: 1 %
Lymphocytes Relative: 4 %
Lymphs Abs: 0.7 K/uL (ref 0.7–4.0)
MCH: 32.4 pg (ref 26.0–34.0)
MCHC: 32.6 g/dL (ref 30.0–36.0)
MCV: 99.7 fL (ref 80.0–100.0)
Monocytes Absolute: 1.1 K/uL — ABNORMAL HIGH (ref 0.1–1.0)
Monocytes Relative: 7 %
Neutro Abs: 13.8 K/uL — ABNORMAL HIGH (ref 1.7–7.7)
Neutrophils Relative %: 88 %
Platelets: 300 K/uL (ref 150–400)
RBC: 2.99 MIL/uL — ABNORMAL LOW (ref 4.22–5.81)
RDW: 17.2 % — ABNORMAL HIGH (ref 11.5–15.5)
WBC: 15.7 K/uL — ABNORMAL HIGH (ref 4.0–10.5)
nRBC: 0 % (ref 0.0–0.2)

## 2024-05-21 LAB — MAGNESIUM: Magnesium: 1.1 mg/dL — ABNORMAL LOW (ref 1.7–2.4)

## 2024-05-21 LAB — PROCALCITONIN: Procalcitonin: 0.38 ng/mL

## 2024-05-21 LAB — PHOSPHORUS: Phosphorus: 2.5 mg/dL (ref 2.5–4.6)

## 2024-05-21 LAB — BRAIN NATRIURETIC PEPTIDE: B Natriuretic Peptide: 1768.7 pg/mL — ABNORMAL HIGH (ref 0.0–100.0)

## 2024-05-21 MED ORDER — MAGNESIUM SULFATE 2 GM/50ML IV SOLN
2.0000 g | Freq: Once | INTRAVENOUS | Status: AC
  Administered 2024-05-21: 2 g via INTRAVENOUS
  Filled 2024-05-21: qty 50

## 2024-05-21 MED ORDER — FUROSEMIDE 10 MG/ML IJ SOLN: 40.0000 mg | Freq: Once | INTRAMUSCULAR | Status: DC

## 2024-05-21 MED ORDER — METOPROLOL TARTRATE 25 MG PO TABS
25.0000 mg | ORAL_TABLET | Freq: Two times a day (BID) | ORAL | Status: DC
  Administered 2024-05-21: 25 mg via ORAL
  Filled 2024-05-21: qty 1

## 2024-05-21 MED ORDER — TRAZODONE HCL 50 MG PO TABS
50.0000 mg | ORAL_TABLET | Freq: Every day | ORAL | Status: DC
  Administered 2024-05-21 – 2024-05-25 (×5): 50 mg via ORAL
  Filled 2024-05-21 (×5): qty 1

## 2024-05-21 NOTE — Progress Notes (Signed)
 Occupational Therapy Treatment Patient Details Name: Thomas Ferrell MRN: [id number] DOB: October 09, 1941 Today's Date: 05/21/2024   History of present illness Pt is an 83 y/o male who presents to [facility] 05/18/24 post ground level fall.  PMH significant for PAD, carotid artery stenosis, HTN, prior history of tobacco abuse, stroke/TIA, COPD, anemia.  Recent hospitalization 5/25 for SOB and COPD.   OT comments  Pt progressing well towards goals. Progressed to complete bed mobility with mod assist, STS with min assist, and improved LB ADLs to min assist. Short steps were completed along bedside, limited d/t RN needing to complete EEG in bed. Poor safety awareness and judgment create safety risk. Continue to recommend <3 hours of skilled rehab daily to optimize independence levels. Will continue to follow acutely.       If plan is discharge home, recommend the following:  Assist for transportation;Assistance with cooking/housework;Two people to help with walking and/or transfers;A lot of help with bathing/dressing/bathroom;Help with stairs or ramp for entrance   Equipment Recommendations  None recommended by OT    Recommendations for Other Services      Precautions / Restrictions Precautions Precautions: Fall Recall of Precautions/Restrictions: Intact Restrictions Weight Bearing Restrictions Per Provider Order: No       Mobility Bed Mobility Overal bed mobility: Needs Assistance Bed Mobility: Supine to Sit, Sit to Supine     Supine to sit: Mod assist, HOB elevated, Used rails Sit to supine: Contact guard assist, HOB elevated, Used rails   General bed mobility comments: mod HH assist for trunk and scooting EOB    Transfers Overall transfer level: Needs assistance Equipment used: Rolling walker (2 wheels) Transfers: Sit to/from Stand, Bed to chair/wheelchair/BSC Sit to Stand: Min assist     Step pivot transfers: Min assist     General transfer comment: Min assist to stand with RW,  taking short steps in room     Balance Overall balance assessment: Needs assistance Sitting-balance support: Feet supported Sitting balance-Leahy Scale: Fair     Standing balance support: Bilateral upper extremity supported, Reliant on assistive device for balance Standing balance-Leahy Scale: Poor Standing balance comment: Reliant on RW     ADL either performed or assessed with clinical judgement   ADL Overall ADL's : Needs assistance/impaired       Lower Body Dressing: Minimal assistance;Sit to/from stand Lower Body Dressing Details (indicate cue type and reason): Pt able to reach feet for socks in bed, min assist to steady slip on shoes Toilet Transfer: Minimal assistance;Stand-pivot;Rolling walker (2 wheels) Toilet Transfer Details (indicate cue type and reason): Simulated in room Toileting- Clothing Manipulation and Hygiene: Minimal assistance;Sit to/from stand       Functional mobility during ADLs: Minimal assistance;Rolling walker (2 wheels)      Extremity/Trunk Assessment Upper Extremity Assessment Upper Extremity Assessment: Generalized weakness   Lower Extremity Assessment Lower Extremity Assessment: Defer to PT evaluation        Vision   Vision Assessment?: No apparent visual deficits         Communication Communication Communication: Impaired Factors Affecting Communication: Difficulty expressing self   Cognition Arousal: Alert Behavior During Therapy: WFL for tasks assessed/performed Cognition: No apparent impairments             OT - Cognition Comments: Situational awareness intact, decreased stm, poor safety awareness but overall WFL       Following commands: Intact        Cueing   Cueing Techniques: Verbal cues, Tactile cues  General Comments VSS on 4L O2    Pertinent Vitals/ Pain       Pain Assessment Pain Assessment: No/denies pain   Frequency  Min 2X/week        Progress Toward Goals  OT Goals(current goals  can now be found in the care plan section)  Progress towards OT goals: Progressing toward goals  Acute Rehab OT Goals Patient Stated Goal: To go home OT Goal Formulation: With patient Time For Goal Achievement: 06/02/24 Potential to Achieve Goals: Good ADL Goals Pt Will Perform Grooming: with supervision;sitting Pt Will Perform Upper Body Dressing: with supervision;sitting Pt Will Perform Lower Body Dressing: with min assist;sitting/lateral leans Pt Will Transfer to Toilet: with min assist;with transfer board  Plan         AM-PAC OT 6 Clicks Daily Activity     Outcome Measure   Help from another person eating meals?: Total (NPO) Help from another person taking care of personal grooming?: A Little Help from another person toileting, which includes using toliet, bedpan, or urinal?: A Little Help from another person bathing (including washing, rinsing, drying)?: A Lot Help from another person to put on and taking off regular upper body clothing?: A Little Help from another person to put on and taking off regular lower body clothing?: A Lot 6 Click Score: 14    End of Session Equipment Utilized During Treatment: Gait belt;Rolling walker (2 wheels);Oxygen  (4L)  OT Visit Diagnosis: Unsteadiness on feet (R26.81);Muscle weakness (generalized) (M62.81)   Activity Tolerance Patient tolerated treatment well   Patient Left in bed;with call bell/phone within reach;with nursing/sitter in room   Nurse Communication Mobility status        Time: 8395-8378 OT Time Calculation (min): 17 min  Charges: OT General Charges $OT Visit: 1 Visit OT Treatments $Self Care/Home Management : 8-22 mins  Adrianne BROCKS, OT  Acute Rehabilitation Services Office [phone] Secure chat preferred   Adrianne GORMAN Savers 05/21/2024, 4:44 PM

## 2024-05-21 NOTE — Procedures (Signed)
 Modified Barium Swallow Study  Patient Details  Name: Thomas Ferrell MRN: [id number] Date of Birth: 1941-11-05  Today's Date: 05/21/2024  Modified Barium Swallow completed.  Full report located under Chart Review in the Imaging Section.  History of Present Illness Pt is an 83 y/o male who presents to [facility] 05/18/24 post ground level fall. CXR shows Progressive superimposed left lower lobe airspace disease is consistent with  pneumonia or aspiration pneumonitis. Pt has been see in prior visits for chronic coughing. Has not had any instrumental testing.  PMH significant for PAD, carotid artery stenosis, HTN, prior history of tobacco abuse, stroke/TIA, COPD, anemia.  Recent hospitalization 5/25 for SOB and COPD.   Clinical Impression Pt presents with a moderate-severe pharyngeal dysphagia and concerns for a primary esophageal dysphagia per results of MBSS completed today. There was aspiration of thin liquids, nectar-thick liquids, and honey-thick liquid residue vs puree vs mixture. Majority of aspiration events were silent with occasional delayed throat clearing observed following some aspiration events of thin liquids.   Oral phase judged to be grossly WNL.   Pharyngeal deficits included mistiming of swallow initiation vs incomplete closure of laryngeal vestibule contributing to penetration/aspiration events. There was reduced pharyngeal stripping resulting in min residue in vallecular space and posterior pharyngeal wall post swallow.   Concern for esophageal dysphagia included significant and progressive retention of liquid and solid residue throughout the esophagus. There was x1 instance of backflow of thin liquids through the UES that briefly penetrated the airway before being re-swallowed. Image below of esophageal sweep at end of study.   Poor esophageal clearance of liquids and solids. Recommend GI consult.   SLP to follow up to trial 5cc Provale cup with pt to determine if pt can have small  sips of thin liquids. Diet order pending based on tolerance. Updated RN and MD on MBSS results and recommendations.    Factors that may increase risk of adverse event in presence of aspiration Noe & Lianne 2021): Respiratory or GI disease;Frail or deconditioned;Limited mobility;Weak cough;Frequent aspiration of large volumes  Swallow Evaluation Recommendations Recommendations: PO diet;Ice chips PRN after oral care PO Diet Recommendation: Thin liquids (Level 0);Clear liquid diet (via spooon or 5cc Provale cup) Medication Administration: Crushed with puree Supervision: Patient able to self-feed Swallowing strategies  : Slow rate;Small bites/sips (liquids by spoon or 5cc Provale cup only) Postural changes: Position pt fully upright for meals;Stay upright 30-60 min after meals Oral care recommendations: Oral care QID (4x/day) Recommended consults: Consider GI consultation      Thomas Ferrell 05/21/2024,10:10 AM

## 2024-05-21 NOTE — Progress Notes (Signed)
 PROGRESS NOTE    Thomas Ferrell  FMW:[id number] DOB: Jun 29, 1941 DOA: 05/18/2024 PCP: Thomas Senior, MD  Chief Complaint  Patient presents with   Fall    Brief Narrative:   Thomas Ferrell is an 83 y.o. male past medical history significant for peripheral arterial disease, carotid stenosis status post bilateral common femoral endarterectomy and bypass, essential hypertension, history of TIA, paroxysmal atrial fibrillation on Eliquis  COPD on 2 L of oxygen  at home per patient's report failure to thrive he called EMS as he could not get get up from the floor he did not hit his head.  White blood cell count of 23,000 chest x-ray showed left lower lobe consolidation   Assessment & Plan:   Principal Problem:   Fall Active Problems:   CAP (community acquired pneumonia)   FTT (failure to thrive) in adult   PNA (pneumonia)   Pressure injury of skin   Malnutrition of moderate degree   Acute hypoxemic respiratory failure (HCC)  Acute Hypoxic Respiratory Failure secondary to Pneumonia Reportedly on 2 L chronically - need to clarify this with his wife Overnight with worsened SOB with increased WOB, received lasix   CXR at presentation with superimposed LLL airspace disease c/w pneumonia or aspiration pneumonitis CXR 7/9 with chronic interstitial fibrosis with suggestion of superimposed airspace disease on the L Ceftriaxone , doxycycline   Urine strep, legionella, MRSA PCR, sputum cx Blood cx NGTD SLP eval with concern for aspiration - suspect this is cause of his presentation  Elevated BNP  Concern for Volume Overload Clinically, doesn't appear overloaded, but BNP elevated from prior and worsening hypoxia with abnormal CXR Will diurese as tolerated Echo 03/2024 with EF 60-65%, no RWMA, grade 1 diastolic dysfunction - will repeat limited echo for EF Follow EKG - will discuss with RN  Dysphagia  Concern for Aspiration MBS today, concern for poor esophageal clearance of liquids and  solids GI has been consulted SLP recommending NPO for now, meds crushed with puree  Acute Metabolic Encephalopathy Concern for Cognitive Deficit Per EDP note, hallucinating prior to admission Still confused, will work on better picture of baseline mental status - some memory issues the last few months.  Issues with short term memory recently, getting progressively worse.  He's been combative here.   Neurology follow up outpatient - trial trazodone  for sleep Delirium precautions  Paroxysmal atrial fibrillation on Eliquis : Rate controlled metoprolol  Eliquis  on hold  Avoid NSAIDs   Iron Deficiency Anemia Check Thomas Ferrell B12 (elevated) and folate (wnl) Iron is low trend   Physical deconditioning/ Fall: PT OT consulted.  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   Right Forearm Hematoma 9.4 x 4.4 x 2.7 cm hematoma in subcutaneous tissues of proximal dorsal forearm - seems to be improving per discussion with his wife - relative stable today - per wife, has been this size for [name] while now Monitor off anticoagulation   Essential hypertension: Blood pressure is elevated resume home antihypertensive regimen.   Moderate Malnutrition  Noted, RD c/s Body mass index is 18.13 kg/m.  Sacral decubitus ulcer present on admission unstageable  Goals of care Wife notes he has DNR Will c/s palliative care for additional GOC    DVT prophylaxis: SCD Code Status: DNR after discussion with wife today Family Communication: wife over phone Disposition:   Status is: Inpatient Remains inpatient appropriate because: need for ongoing inpatient care   Consultants:  none  Procedures:  none  Antimicrobials:  Anti-infectives (From admission, onward)    Start  Dose/Rate Route Frequency Ordered Stop   05/19/24 1015  doxycycline  (VIBRA -TABS) tablet 100 mg        100 mg Oral Every 12 hours 05/19/24 0916 05/23/24 0959   05/19/24 0800  cefTRIAXone  (ROCEPHIN ) 1 g in sodium chloride  0.9 % 100 mL IVPB         1 g 200 mL/hr over 30 Minutes Intravenous Every 24 hours 05/18/24 1349 05/23/24 0759   05/18/24 1445  azithromycin  (ZITHROMAX ) tablet 500 mg  Status:  Discontinued        500 mg Oral Daily 05/18/24 1349 05/19/24 0916   05/18/24 0615  vancomycin  (VANCOCIN ) IVPB 1000 mg/200 mL premix        1,000 mg 200 mL/hr over 60 Minutes Intravenous  Once 05/18/24 0608 05/18/24 0733   05/18/24 0615  ceFEPIme  (MAXIPIME ) 2 g in sodium chloride  0.9 % 100 mL IVPB        2 g 200 mL/hr over 30 Minutes Intravenous  Once 05/18/24 0608 05/18/24 0656       Subjective: No complaints Wanting to go home  Objective: Vitals:   05/21/24 0734 05/21/24 0756 05/21/24 1000 05/21/24 1032  BP:  (!) 148/62 134/86 (!) 140/75  Pulse: 87 96  93  Resp:  20  20  Temp:  98.3 F (36.8 C)  98 F (36.7 C)  TempSrc:  Oral  Oral  SpO2: 97% 95%  97%  Weight:      Height:        Intake/Output Summary (Last 24 hours) at 05/21/2024 1550 Last data filed at 05/21/2024 1414 Gross per 24 hour  Intake 476 ml  Output 2750 ml  Net -2274 ml   Filed Weights   05/18/24 1325 05/20/24 1700 05/21/24 0441  Weight: 51.6 kg 51.6 kg 47.9 kg    Examination:  General: chronically ill appearing Cardiovascular: RRR Lungs: unlabored on 4 L  Abdomen: Soft, nontender, nondistended  Neurological: Alert. Moves all extremities 4 with equal strength. Cranial nerves II through XII grossly intact. Skin: Warm and dry. No rashes or lesions. Extremities: No clubbing or cyanosis. No edema.  Data Reviewed: I have personally reviewed following labs and imaging studies  CBC: Recent Labs  Lab 05/18/24 0206 05/18/24 0435 05/18/24 0742 05/20/24 0819 05/21/24 0028  WBC  --  22.9* 13.7* 11.6* 15.7*  NEUTROABS  --  19.3*  --  10.8* 13.8*  HGB 9.5*  8.8* 9.3* 8.4* 8.8* 9.7*  HCT 28.0*  26.0* 29.2* 27.2* 27.1* 29.8*  MCV  --  104.3* 104.2* 99.3 99.7  PLT  --  357 220 244 300    Basic Metabolic Panel: Recent Labs  Lab 05/18/24 0206  05/18/24 0208 05/18/24 0742 05/19/24 0640 05/20/24 0819 05/21/24 0028  NA 136  136 136  --  139 138 138  K 4.3  4.3 4.3  --  3.5 3.3* 4.2  CL 103 105  --  111 105 102  CO2  --  20*  --  18* 22 22  GLUCOSE 101* 109*  --  113* 166* 130*  BUN 15 14  --  9 11 13   CREATININE 0.90 1.00 0.86 0.75 0.73 0.74  CALCIUM   --  7.6*  --  7.9* 8.3* 8.5*  MG  --   --   --   --   --  1.1*  PHOS  --   --   --   --   --  2.5    GFR: Estimated Creatinine Clearance: 49.1 mL/min (by C-G  formula based on SCr of 0.74 mg/dL).  Liver Function Tests: Recent Labs  Lab 05/18/24 0208 05/20/24 0819 05/21/24 0028  AST 35 43* 53*  ALT 25 33 41  ALKPHOS 72 63 65  BILITOT 1.2 1.0 0.9  PROT 5.9* 5.3* 6.1*  ALBUMIN 3.0* 2.5* 2.9*    CBG: No results for input(s): GLUCAP in the last 168 hours.   Recent Results (from the past 240 hours)  Blood Culture (routine x 2)     Status: None (Preliminary result)   Collection Time: 05/18/24  1:21 AM   Specimen: BLOOD  Result Value Ref Range Status   Specimen Description BLOOD LEFT ARM  Final   Special Requests   Final    BOTTLES DRAWN AEROBIC AND ANAEROBIC Blood Culture adequate volume   Culture   Final    NO GROWTH 3 DAYS Performed at [facility], [address] [place], [place] [address]    Report Status PENDING  Incomplete  Blood Culture (routine x 2)     Status: None (Preliminary result)   Collection Time: 05/18/24  1:26 AM   Specimen: BLOOD RIGHT HAND  Result Value Ref Range Status   Specimen Description BLOOD RIGHT HAND  Final   Special Requests   Final    BOTTLES DRAWN AEROBIC AND ANAEROBIC Blood Culture adequate volume   Culture   Final    NO GROWTH 3 DAYS Performed at [facility], [address] [place], [place] [address]    Report Status PENDING  Incomplete         Radiology Studies: DG Swallowing Func-Speech Pathology Result Date: 05/21/2024 Table formatting from the original result was not included. Images from the  original result were not included. Modified Barium Swallow Study Patient Details Name: Thomas Ferrell MRN: [id number] Date of Birth: 1940/11/21 Today's Date: 05/21/2024 HPI/PMH: HPI: Pt is an 83 y/o male who [facility] 05/18/24 post ground level fall. CXR shows Progressive superimposed left lower lobe airspace disease is consistent with  pneumonia or aspiration pneumonitis. Pt has been see in prior visits for chronic coughing. Has not had any instrumental testing.  PMH significant for PAD, carotid artery stenosis, HTN, prior history of tobacco abuse, stroke/TIA, COPD, anemia.  Recent hospitalization 5/25 for SOB and COPD. 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 There was aspiration of thin liquids, nectar-thick liquids, and honey-thick liquid residue vs puree vs mixture. Majority of aspiration events were silent with occasional delayed throat clearing observed following some aspiration events of thin liquids. Oral phase judged to be grossly WNL. Pharyngeal deficits included mistiming of swallow initiation vs incomplete closure of laryngeal vestibule contributing to penetration/aspiration events. There was reduced pharyngeal stripping resulting in min residue in vallecular space and posterior pharyngeal wall post swallow. Concern for esophageal dysphagia included significant and progressive retention of liquid and solid residue throughout the esophagus. There was x1 instance of backflow of thin liquids through the UES that briefly penetrated the airway before being re-swallowed. Image below of esophageal sweep at end of study. Poor esophageal clearance of liquids and solids. Recommend GI consult. SLP to follow up to trial 5cc Provale cup with pt to determine if pt can have small sips of thin liquids. Diet order pending based on tolerance. Updated RN and MD on MBSS results and recommendations.  Factors that may increase risk of adverse event in presence of aspiration Noe &  Lianne 2021): Factors that may increase risk of adverse event in presence of aspiration Noe & Lianne 2021): Respiratory or GI disease; Frail or deconditioned; Limited mobility; Weak cough; Frequent aspiration of large volumes Recommendations/Plan: Swallowing Evaluation Recommendations Swallowing Evaluation Recommendations Recommendations: PO diet; Ice chips PRN after oral care PO Diet Recommendation: Thin liquids (Level 0); Clear liquid diet (via spooon or 5cc Provale cup) Medication Administration: Crushed with puree Supervision: Patient able to self-feed Swallowing strategies  : Slow rate; Small bites/sips (liquids by spoon or 5cc Provale cup only) Postural changes: Position pt fully upright for meals; Stay upright 30-60 min after meals Oral care recommendations: Oral care QID (4x/day) Recommended consults: Consider GI consultation Treatment Plan Treatment Plan Treatment recommendations: Therapy as outlined in treatment plan below Follow-up recommendations: -- (TBD) Functional status assessment: Patient has had Elizabelle Fite recent decline in their functional status and demonstrates the ability to make significant improvements in function in Heyden Jaber reasonable and predictable amount of time. Treatment frequency: Min 2x/week Treatment duration: 4 weeks Interventions: Aspiration precaution training; Diet toleration management by SLP; Patient/family education Recommendations Recommendations for follow up therapy are one component of [place] multi-disciplinary discharge planning process, led by the attending physician.  Recommendations may be updated based on patient status, additional functional criteria and insurance authorization. Assessment: Orofacial Exam: Orofacial Exam Oral Cavity: Oral Hygiene: WFL Oral Cavity - Dentition: Dentures, top; Dentures, bottom Orofacial Anatomy: WFL Oral Motor/Sensory Function: WFL Anatomy: No data recorded Boluses  Administered: Boluses Administered Boluses Administered: Thin liquids (Level 0); Mildly thick liquids (Level 2, nectar thick); Moderately thick liquids (Level 3, honey thick); Puree; Solid  Oral Impairment Domain: Oral Impairment Domain Lip Closure: No labial escape Tongue control during bolus hold: Posterior escape of less than half of bolus Bolus preparation/mastication: Timely and efficient chewing and mashing Bolus transport/lingual motion: Delayed initiation of tongue motion (oral holding) Oral residue: Trace residue lining oral structures Location of oral residue : Tongue Initiation of pharyngeal swallow : Valleculae  Pharyngeal Impairment Domain: Pharyngeal Impairment Domain Soft palate elevation: No bolus between soft palate (SP)/pharyngeal wall (PW) Laryngeal elevation: Complete superior movement of thyroid  cartilage with complete approximation of arytenoids to epiglottic petiole Anterior hyoid excursion: Complete anterior movement Epiglottic movement: Complete inversion Laryngeal vestibule closure: Incomplete, narrow column air/contrast in laryngeal vestibule Pharyngeal stripping wave : Present - diminished Pharyngeal contraction (Rotunda Worden/P view only): N/Tirrell Buchberger Pharyngoesophageal segment opening: Complete distension and complete duration, no obstruction of flow Tongue base retraction: Trace column of contrast or air between tongue base and PPW Pharyngeal residue: Trace residue within or on pharyngeal structures Location of pharyngeal residue: Valleculae; Tongue base  Esophageal Impairment Domain: Esophageal Impairment Domain Esophageal clearance upright position: Esophageal retention with retrograde flow through the PES; Minimal to no esophageal clearance Pill: Pill Consistency administered: -- (not administered) Penetration/Aspiration Scale Score: Penetration/Aspiration Scale Score 1.  Material does not enter airway: Thin liquids (Level 0); Solid 2.  Material enters airway, remains ABOVE vocal cords then ejected  out: Moderately thick liquids (Level 3, honey thick) 5.  Material enters airway, CONTACTS cords and not ejected out: Mildly thick liquids (Level 2, nectar thick) 6.  Material enters airway, passes BELOW cords then ejected out: Mildly thick liquids (Level 2, nectar thick) 8.  Material enters airway, passes BELOW cords without attempt by patient to eject out (silent aspiration) : Moderately thick liquids (Level 3, honey thick); Puree; Thin liquids (Level 0) Compensatory Strategies: Compensatory Strategies Compensatory strategies: Yes Straw: Ineffective Ineffective Straw: Thin liquid (Level 0) Multiple swallows: Ineffective  Chin tuck: Ineffective Ineffective Chin Tuck: Thin liquid (Level 0)   General Information: No data recorded Diet Prior to this Study: Regular; Thin liquids (Level 0)   No data recorded  No data recorded  No data recorded  No data recorded Behavior/Cognition: Alert; Cooperative No data recorded No data recorded No data recorded Volitional Swallow: Able to elicit Exam Limitations: No limitations Goal Planning: Prognosis for improved oropharyngeal function: Fair No data recorded No data recorded No data recorded Consulted and agree with results and recommendations: Patient; Nurse; Physician Pain: Pain Assessment Pain Assessment: No/denies pain End of Session: Start Time:SLP Start Time (ACUTE ONLY): 0805 Stop Time: SLP Stop Time (ACUTE ONLY): 0835 Time Calculation:SLP Time Calculation (min) (ACUTE ONLY): 30 min Charges: SLP Evaluations $ SLP Speech Visit: 1 Visit SLP Evaluations $BSS Swallow: 1 Procedure $MBS Swallow: 1 Procedure SLP visit diagnosis: SLP Visit Diagnosis: Dysphagia, pharyngoesophageal phase (R13.14) Past Medical History: Past Medical History: Diagnosis Date  Abnormality of gait 03/03/2013  bladder ca dx'd 11/2009  chemo/xrt comp 02/2010  Cerebrovascular disease   right brain CVA 2007  COPD (chronic obstructive pulmonary disease) (HCC)   Cough productive of clear sputum   TX RECENT SINUS  INFECTION   CVA (cerebral vascular accident) (HCC) 2007  r-cva  Dyslipidemia   Hypertension   Lumbago  Past Surgical History: Past Surgical History: Procedure Laterality Date  ABDOMINAL AORTAGRAM N/Aahan Marques 12/07/2013  Procedure: ABDOMINAL EZELLA;  Surgeon: Lonni GORMAN Blade, MD;  Location: [facility] CATH LAB;  Service: Cardiovascular;  Laterality: N/Areliz Rothman;  ABDOMINAL AORTOGRAM W/LOWER EXTREMITY N/Nayah Lukens 06/26/2023  Procedure: ABDOMINAL AORTOGRAM W/LOWER EXTREMITY;  Surgeon: Lanis Fonda BRAVO, MD;  Location: [facility] INVASIVE CV LAB;  Service: Cardiovascular;  Laterality: N/Dao Mearns;  BACK SURGERY  2010  bladder cancer    CAROTID ARTERY ANGIOPLASTY  2009  CHOLECYSTECTOMY N/Tam Savoia 06/06/2016  Procedure: LAPAROSCOPIC CHOLECYSTECTOMY WITH INTRAOPERATIVE CHOLANGIOGRAM;  Surgeon: Krystal Spinner, MD;  Location: [facility] ORS;  Service: General;  Laterality: N/Eilah Common;  ERCP N/Damyia Strider 06/05/2016  Procedure: ENDOSCOPIC RETROGRADE CHOLANGIOPANCREATOGRAPHY (ERCP);  Surgeon: Norleen Hint, MD;  Location: [facility];  Service: Endoscopy;  Laterality: N/Bellami Farrelly;  ESOPHAGOGASTRODUODENOSCOPY (EGD) WITH PROPOFOL  N/Deborah Lazcano 06/05/2016  Procedure: ESOPHAGOGASTRODUODENOSCOPY (EGD);  Surgeon: Norleen Hint, MD;  Location: [facility];  Service: Endoscopy;  Laterality: N/Christyna Letendre;  FEMORAL-FEMORAL BYPASS GRAFT N/Yareth Macdonnell 12/09/2013  Procedure: BYPASS GRAFT FEMORAL-FEMORAL ARTERY WITH BILATERAL ENDARTERECTOMY ;  Surgeon: Carlin BRAVO Haddock, MD;  Location: [facility] OR;  Service: Vascular;  Laterality: N/Havanna Groner;  FEMORAL-POPLITEAL BYPASS GRAFT Left 12/09/2013  Procedure: BYPASS GRAFT FEMORAL-POPLITEAL ARTERY - LEFT ;  Surgeon: Carlin BRAVO Haddock, MD;  Location: [facility] OR;  Service: Vascular;  Laterality: Left;  LAMINECTOMY  09/08/2013  L 2 L3 L4 L5       Dr Amon  LOWER EXTREMITY ANGIOGRAPHY Right 07/03/2023  Procedure: Lower Extremity Angiography;  Surgeon: Lanis Fonda BRAVO, MD;  Location: [facility] INVASIVE CV LAB;  Service: Cardiovascular;  Laterality: Right;  PERIPHERAL VASCULAR BALLOON ANGIOPLASTY Right 07/03/2023  Procedure: PERIPHERAL VASCULAR BALLOON  ANGIOPLASTY;  Surgeon: Lanis Fonda BRAVO, MD;  Location: [facility] INVASIVE CV LAB;  Service: Cardiovascular;  Laterality: Right;  R external iliac [name] 05/21/2024, 10:24 AM  DG Chest Port 1 View Result Date: 05/20/2024 CLINICAL DATA:  Shortness of breath EXAM: PORTABLE CHEST 1 VIEW COMPARISON:  05/20/2024 FINDINGS: Elevation of left hemidiaphragm. Heart size and pulmonary vascularity are normal. Emphysematous changes and fibrosis in the lungs. Suggestion of superimposed airspace disease on the left, possibly superimposed pneumonia right asymmetrical edema. No pleural effusion or pneumothorax. Similar appearance  to previous study. Calcification of the aorta. Postoperative changes in the lumbar spine. IMPRESSION: No change since previous study. 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 Electronically Signed   By: Elsie Gravely M.D.   On: 05/20/2024 23:24   DG CHEST PORT 1 VIEW Result Date: 05/20/2024 CLINICAL DATA:  Shortness of breath. EXAM: PORTABLE CHEST 1 VIEW COMPARISON:  Chest radiograph dated 05/18/2024. FINDINGS: Background of emphysema and chronic Scholl coarsening. Bilateral reticulonodular airspace densities, left greater than right, and significantly increased since the prior radiograph consistent with pneumonia. Aspiration is not excluded. No pleural effusion pneumothorax. The cardiac silhouette is within limits. Atherosclerotic calcification of the aorta. No acute osseous pathology. IMPRESSION: Progression of pulmonary infiltrates. Electronically Signed   By: Vanetta Chou M.D.   On: 05/20/2024 18:34        Scheduled Meds:  atorvastatin   20 mg Oral q AM   doxycycline   100 mg Oral Q12H   feeding supplement  237 mL Oral BID BM   fluticasone  furoate-vilanterol  1 puff Inhalation Daily   furosemide   40 mg Intravenous Daily   furosemide   40 mg Intravenous Once   ipratropium-albuterol   3 mL Nebulization BID    irbesartan   150 mg Oral Daily   metoprolol  tartrate  12.5 mg Oral BID   pantoprazole   40 mg Oral Q0600   sertraline   75 mg Oral Daily   spironolactone   25 mg Oral Daily   Continuous Infusions:  cefTRIAXone  (ROCEPHIN )  IV 1 g (05/21/24 1057)     LOS: 2 days    Time spent: over 30 min    Meliton Monte, MD [facility]   To contact the attending provider between 7A-7P or the covering provider during after hours 7P-7A, please log into the web site [email or web address] and access using universal [place] password for that web site. If you do not have the password, please call the hospital operator.  05/21/2024, 3:50 PM

## 2024-05-21 NOTE — Progress Notes (Signed)
 Speech Language Pathology Treatment: Dysphagia  Patient Details Name: Thomas Ferrell MRN: [id number] DOB: [birth year] Today's Date: 05/21/2024 Time: 8962-8898 SLP Time Calculation (min) (ACUTE ONLY): 24 min  Assessment / Plan / Recommendation Clinical Impression  SLP at bedside to review MBSS and images with pt and wife. Explained reasoning for NPO status with allowance of ice chips for comfort and oral meds crushed in applesauce. Initially, thought about trying clear liquids, however, I do not anticipate pt would abide by swallow strategies of spoon sips of liquids to reduce aspiration risk. Pt quite agitated throughout session and became increasingly agitated as I reviewed these results. His wife attempted to placate him and he started swinging at her and speaking harshly. SLP prompted his wife to step back and maintain distance. Nursing in room to help redirect and Doctor updated.   Highly recommend GI consult to see if any intervention can be offered to aid esophageal motility of liquids and solids. Please see MBSS note for image of barium retention. Pt will likely need follow up MBSS prior to diet advancement. Continue SLP PoC.    HPI HPI: Pt is an 83 y/o male who presents to [facility] 05/18/24 post ground level fall. CXR shows Progressive superimposed left lower lobe airspace disease is consistent with  pneumonia or aspiration pneumonitis. Pt has been see in prior visits for chronic coughing. Has not had any instrumental testing.  PMH significant for PAD, carotid artery stenosis, HTN, prior history of tobacco abuse, stroke/TIA, COPD, anemia.  Recent hospitalization 5/25 for SOB and COPD. MBS completed on 05/21/24 with significant esophageal rentention of liquids and solids. Silent aspiration of thins, nectar-thick, and honey-thick residue vs puree.      SLP Plan  Continue with current plan of care          Recommendations  Diet recommendations: NPO (can have ice chips) Medication  Administration: Crushed with puree Supervision: Patient able to self feed                  Oral care QID   Frequent or constant Supervision/Assistance Dysphagia, pharyngoesophageal phase (R13.14)     Continue with current plan of care     Thomas Ferrell Thomas Ferrell  05/21/2024, 11:11 AM

## 2024-05-21 NOTE — Plan of Care (Signed)

## 2024-05-21 NOTE — TOC Progression Note (Signed)
 Transition of Care [facility]) - Progression Note    Patient Details  Name: Thomas Ferrell MRN: [id number] Date of Birth: [birth year]  Transition of [facility]) CM/SW Contact  Luise JAYSON Pan, [place] Phone Number: 05/21/2024, 11:23 AM  Clinical Narrative:   CSW messaged My[facility] with [facility] regarding bed availability, awaiting response.    TOC will continue to follow.    Expected Discharge Plan: Skilled Nursing Facility Barriers to Discharge: Continued Medical Work up, SNF Pending bed offer  Expected Discharge Plan and Services In-house Referral: Clinical Social Work   Post Acute Care Choice: Skilled Nursing Facility (per wife) Living arrangements for the past 2 months: Single Family Home                                       Social Determinants of Health (SDOH) Interventions SDOH Screenings   Food Insecurity: No Food Insecurity (05/18/2024)  Housing: Low Risk  (05/18/2024)  Transportation Needs: No Transportation Needs (05/18/2024)  Utilities: Not At Risk (05/18/2024)  Financial Resource Strain: Low Risk  (04/28/2024)  Social Connections: Patient Unable To Answer (05/18/2024)  Recent Concern: Social Connections - Moderately Isolated (03/18/2024)  Tobacco Use: Medium Risk (05/18/2024)  Health Literacy: Adequate Health Literacy (04/28/2024)    Readmission Risk Interventions    03/19/2024    1:30 PM  Readmission Risk Prevention Plan  Post Dischage Appt Complete  Medication Screening Complete  Transportation Screening Complete

## 2024-05-21 NOTE — Progress Notes (Signed)
 Nutrition Follow Up  DOCUMENTATION CODES:   Non-severe (moderate) malnutrition in context of chronic illness (COPD, atrial fibrilation, decreased functional status related to illness)  INTERVENTION:  Discontinued 48 hour calorie count due to pt's status changing requiring him to be transferred to progressive floor Pt now made NPO per SLP recommendations  Recommend palliative consult to establish GOC of discussions, MD agrees  If GOC align with continued nutrition interventions, recommend: Placing OG/NG/Cortrak to initiate enteral nutrition Initiate tube feeding via OG/NG/Cortrak: begin at 31ml/h and increase 10 ml every 12 hr until goal rate of 73ml/h is reached Osmolite 1.5 at 40 ml/h (960 ml per day)  Prosource TF20 60 ml daily  Provides 1520 kcal, 80 gm protein, 731 ml free water daily Pt is at risk for refeeding syndrome given malnutrition and poor PO for > 1 month. Monitor magnesium  and phosphorus daily x 2 days, MD to replete as needed. 100mg  thiamine x 5 days  NUTRITION DIAGNOSIS:   Moderate Malnutrition related to chronic illness (COPD, atrial fibrilation, decreased functional status related to illness) as evidenced by moderate muscle depletion, moderate fat depletion, energy intake < 75% for > or equal to 1 month.  GOAL:   Patient will meet greater than or equal to 90% of their needs  MONITOR:   PO intake, Supplement acceptance  REASON FOR ASSESSMENT:   Consult Assessment of nutrition requirement/status, Calorie Count  ASSESSMENT:   Pt with hx HTN, prior CVA, COPD, PAD, and atrial fibrillation. Hx of bilateral femoral bypass graft (2015), cholecystectomy (2017), and peripheral vascular balloon angioplasty (2024). Pt brought in by EMS after fall and admitted with FTT acute on chronic respiratory failure w/ pna.  7/7 admitted to 5N 7/9 desat and transferred to [facility] Progressive 7/10 SLP conducted MBS, showed esophageal dysphagia, recommended NPO but okay-ed  medications with applesauce  Discontinued calorie count due to pt's change in status that required pt to be moved to a progressive unit. SLP conducted MBS and showed esophageal dysphagia. Originally SLP recommended clear liquids with supervision and GI consultation but now recommend NPO due to pt not using steps to protect himself with liquids. Recommend palliative consult to initiate GOC discussion, MD agrees. Next step for nutrition intervention is initiation of enteral nutrition if that aligns with pt and pt's family GOC. Will continue to monitor discussion or any changes in pt's status to support necessary nutrition interventions.  Medications reviewed and include:  Doxycycline  lasix  Protonix  Zoloft  Ceftriaxone  Magnesium  sulfate IVPB 2 g  Labs reviewed:  Magnesium  1.1  Diet Order:   Diet Order             Diet NPO time specified Except for: Ice Chips  Diet effective now                   EDUCATION NEEDS:   Education needs have been addressed  Skin:  Skin Assessment: Skin Integrity Issues: Skin Integrity Issues:: Stage I Stage I: coccyx  Last BM:  7/8  Height:   Ht Readings from Last 1 Encounters:  05/20/24 5' 4 (1.626 m)   Weight:   Wt Readings from Last 1 Encounters:  05/21/24 47.9 kg    Ideal Body Weight:  59 kg  BMI:  Body mass index is 18.13 kg/m.  Estimated Nutritional Needs:   Kcal:  1500-1700  Protein:  65-80g  Fluid:  1.5-1.7L  Josette Glance, MS, RDN, LDN Clinical Dietitian I Please reach out via secure chat

## 2024-05-21 NOTE — Progress Notes (Signed)
*  PRELIMINARY RESULTS* Echocardiogram 2D Echocardiogram has been performed.  Thomas Ferrell Stallion 05/21/2024, 9:57 AM

## 2024-05-21 NOTE — Progress Notes (Signed)
 OT Cancellation Note  Patient Details Name: Thomas Ferrell MRN: [id number] DOB: October 23, 1941   Cancelled Treatment:    Reason Eval/Treat Not Completed: Patient at procedure or test/ unavailable Pt leaving unit for procedure. Will follow up as able to.   Boby Eyer C, OT  Acute Rehabilitation Services Office [phone] Secure chat preferred   Adrianne GORMAN Savers 05/21/2024, 8:03 AM

## 2024-05-21 NOTE — Plan of Care (Signed)
  Problem: SLP Dysphagia Goals Goal: Misc Dysphagia Goal Flowsheets (Taken 05/21/2024 0954) Misc Dysphagia Goal: Pt will tolerate small amount of thin liquids (via spoon or 5cc Provale cup) with no overt or subtle s/s of aspiration or decline in pulmonary status.

## 2024-05-22 ENCOUNTER — Inpatient Hospital Stay (HOSPITAL_COMMUNITY): Admitting: Anesthesiology

## 2024-05-22 ENCOUNTER — Encounter (HOSPITAL_COMMUNITY): Payer: Self-pay | Admitting: Internal Medicine

## 2024-05-22 ENCOUNTER — Encounter (HOSPITAL_COMMUNITY)
Admission: EM | Disposition: A | Discharge status: Skilled Nursing Facility | Payer: Self-pay | Source: Home / Self Care | Attending: Family Medicine

## 2024-05-22 DIAGNOSIS — J441 Chronic obstructive pulmonary disease with (acute) exacerbation: Secondary | ICD-10-CM

## 2024-05-22 DIAGNOSIS — K224 Dyskinesia of esophagus: Secondary | ICD-10-CM

## 2024-05-22 DIAGNOSIS — J9601 Acute respiratory failure with hypoxia: Secondary | ICD-10-CM | POA: Diagnosis not present

## 2024-05-22 DIAGNOSIS — Z87891 Personal history of nicotine dependence: Secondary | ICD-10-CM

## 2024-05-22 DIAGNOSIS — I2510 Atherosclerotic heart disease of native coronary artery without angina pectoris: Secondary | ICD-10-CM

## 2024-05-22 DIAGNOSIS — I4891 Unspecified atrial fibrillation: Secondary | ICD-10-CM | POA: Diagnosis not present

## 2024-05-22 HISTORY — PX: ESOPHAGOGASTRODUODENOSCOPY: SHX5428

## 2024-05-22 LAB — COMPREHENSIVE METABOLIC PANEL WITH GFR
ALT: 47 U/L — ABNORMAL HIGH (ref 0–44)
AST: 49 U/L — ABNORMAL HIGH (ref 15–41)
Albumin: 2.7 g/dL — ABNORMAL LOW (ref 3.5–5.0)
Alkaline Phosphatase: 74 U/L (ref 38–126)
Anion gap: 12 (ref 5–15)
BUN: 15 mg/dL (ref 8–23)
CO2: 30 mmol/L (ref 22–32)
Calcium: 8.5 mg/dL — ABNORMAL LOW (ref 8.9–10.3)
Chloride: 99 mmol/L (ref 98–111)
Creatinine, Ser: 0.77 mg/dL (ref 0.61–1.24)
GFR, Estimated: >60 mL/min (ref >60)
Glucose, Bld: 111 mg/dL — ABNORMAL HIGH (ref 70–99)
Potassium: 3.6 mmol/L (ref 3.5–5.1)
Sodium: 141 mmol/L (ref 135–145)
Total Bilirubin: 1.1 mg/dL (ref 0.0–1.2)
Total Protein: 5.9 g/dL — ABNORMAL LOW (ref 6.5–8.1)

## 2024-05-22 LAB — CBC WITH DIFFERENTIAL/PLATELET
Abs Immature Granulocytes: 0.1 K/uL — ABNORMAL HIGH (ref 0.00–0.07)
Basophils Absolute: 0 K/uL (ref 0.0–0.1)
Basophils Relative: 0 %
Eosinophils Absolute: 0.1 K/uL (ref 0.0–0.5)
Eosinophils Relative: 1 %
HCT: 29.9 % — ABNORMAL LOW (ref 39.0–52.0)
Hemoglobin: 9.6 g/dL — ABNORMAL LOW (ref 13.0–17.0)
Immature Granulocytes: 1 %
Lymphocytes Relative: 5 %
Lymphs Abs: 0.5 K/uL — ABNORMAL LOW (ref 0.7–4.0)
MCH: 31.8 pg (ref 26.0–34.0)
MCHC: 32.1 g/dL (ref 30.0–36.0)
MCV: 99 fL (ref 80.0–100.0)
Monocytes Absolute: 0.7 K/uL (ref 0.1–1.0)
Monocytes Relative: 6 %
Neutro Abs: 10.1 K/uL — ABNORMAL HIGH (ref 1.7–7.7)
Neutrophils Relative %: 87 %
Platelets: 292 K/uL (ref 150–400)
RBC: 3.02 MIL/uL — ABNORMAL LOW (ref 4.22–5.81)
RDW: 16.9 % — ABNORMAL HIGH (ref 11.5–15.5)
WBC: 11.6 K/uL — ABNORMAL HIGH (ref 4.0–10.5)
nRBC: 0 % (ref 0.0–0.2)

## 2024-05-22 LAB — PHOSPHORUS: Phosphorus: 3.4 mg/dL (ref 2.5–4.6)

## 2024-05-22 LAB — MAGNESIUM: Magnesium: 2.1 mg/dL (ref 1.7–2.4)

## 2024-05-22 SURGERY — EGD (ESOPHAGOGASTRODUODENOSCOPY)
Anesthesia: Monitor Anesthesia Care

## 2024-05-22 MED ORDER — POTASSIUM CHLORIDE 10 MEQ/100ML IV SOLN
10.0000 meq | INTRAVENOUS | Status: DC
  Administered 2024-05-22: 10 meq via INTRAVENOUS
  Filled 2024-05-22 (×2): qty 100

## 2024-05-22 MED ORDER — SODIUM CHLORIDE 0.9 % IV SOLN: INTRAVENOUS | Status: DC

## 2024-05-22 MED ORDER — SODIUM CHLORIDE 0.9 % IV SOLN: INTRAVENOUS | Status: DC | PRN

## 2024-05-22 MED ORDER — PROPOFOL 500 MG/50ML IV EMUL
INTRAVENOUS | Status: DC | PRN
  Administered 2024-05-22: 100 ug/kg/min via INTRAVENOUS

## 2024-05-22 MED ORDER — LIDOCAINE 2% (20 MG/ML) 5 ML SYRINGE
INTRAMUSCULAR | Status: DC | PRN
  Administered 2024-05-22: 20 mg via INTRAVENOUS

## 2024-05-22 MED ORDER — PROPOFOL 10 MG/ML IV BOLUS
INTRAVENOUS | Status: DC | PRN
  Administered 2024-05-22 (×3): 10 mg via INTRAVENOUS
  Administered 2024-05-22: 20 mg via INTRAVENOUS
  Administered 2024-05-22 (×3): 10 mg via INTRAVENOUS
  Administered 2024-05-22: 20 mg via INTRAVENOUS

## 2024-05-22 MED ORDER — METOPROLOL TARTRATE 12.5 MG HALF TABLET: 12.5000 mg | ORAL_TABLET | Freq: Two times a day (BID) | ORAL | Status: DC

## 2024-05-22 NOTE — Transfer of Care (Signed)
 Immediate Anesthesia Transfer of Care Note  Patient: Thomas Ferrell  Procedure(s) Performed: EGD (ESOPHAGOGASTRODUODENOSCOPY)  Patient Location: PACU  Anesthesia Type:MAC  Level of Consciousness: drowsy  Airway & Oxygen  Therapy: Patient Spontanous Breathing and Patient connected to face mask oxygen   Post-op Assessment: Report given to RN and Post -op Vital signs reviewed and stable  Post vital signs: Reviewed and stable  Last Vitals:  Vitals Value Taken Time  BP 93/54 05/22/24 16:28  Temp    Pulse 73 05/22/24 16:28  Resp 17 05/22/24 16:28  SpO2 100 % 05/22/24 16:28    Last Pain:  Vitals:   05/22/24 1628  TempSrc:   PainSc: Asleep      Patients Stated Pain Goal: 0 (05/18/24 2155)  Complications: No notable events documented.

## 2024-05-22 NOTE — Op Note (Signed)
 [facility] Patient Name: Thomas Ferrell Procedure Date : 05/22/2024 MRN: [id number] Attending MD: Oliva Boots , MD, [id number] Date of Birth: 12-20-1940 CSN: [id number] Age: 83 Admit Type: Inpatient Procedure:                Upper GI endoscopy Indications:              Dysphagia Providers:                Oliva Boots, MD, Gregoria Pierce, RN, Corene Southgate,                            Technician Referring MD:              Medicines:                Monitored Anesthesia Care Complications:            No immediate complications. Estimated Blood Loss:     Estimated blood loss: none. Procedure:                Pre-Anesthesia Assessment:                           - Prior to the procedure, a History and Physical                            was performed, and patient medications and                            allergies were reviewed. The patient's tolerance of                            previous anesthesia was also reviewed. The risks                            and benefits of the procedure and the sedation                            options and risks were discussed with the patient.                            All questions were answered, and informed consent                            was obtained. Prior Anticoagulants: The patient has                            taken Eliquis  (apixaban ), last dose was 5 days                            prior to procedure. ASA Grade Assessment: III - A                            patient with severe systemic disease. After  reviewing the risks and benefits, the patient was                            deemed in satisfactory condition to undergo the                            procedure.                           After obtaining informed consent, the endoscope was                            passed under direct vision. Throughout the                            procedure, the patient's blood pressure, pulse, and                             oxygen  saturations were monitored continuously. The                            [id number]-[id number] ([id number]) Olympus endoscope was introduced                            through the mouth, and advanced to the third part                            of duodenum. The upper GI endoscopy was                            accomplished without difficulty. The patient                            tolerated the procedure well. Scope In: Scope Out: Findings:      Abnormal motility was noted in the esophagus. There is a decrease in       motility of the esophageal body. The distal esophagus/lower esophageal       sphincter is open. Normal peristalsis not noted.      The entire examined stomach was normal.      The duodenal bulb, first portion of the duodenum, second portion of the       duodenum and third portion of the duodenum were normal.      The cardia and gastric fundus were normal on retroflexion. Impression:               - Abnormal esophageal motility, suspicious for                            presbyesophagus.                           - Normal stomach.                           - Normal duodenal bulb, first portion of the  duodenum, second portion of the duodenum and third                            portion of the duodenum.                           - No specimens collected. Recommendation:           - Diet per speech therapy today. Keep head of bed                            elevated for 2 hours after eating                           - Continue present medications. Doubt you would                            want to try low-dose Reglan or erythromycin as a                            motility agent but would leave that to primary team                            or primary care physician as an outpatient                           - Return to GI clinic PRN.                           - Telephone GI clinic if symptomatic PRN. Please                            let me know if I  can be of any further assistance                            with this hospital stay Procedure Code(s):        --- Professional ---                           [phone], Esophagogastroduodenoscopy, flexible,                            transoral; diagnostic, including collection of                            specimen(s) by brushing or washing, when performed                            (separate procedure) Diagnosis Code(s):        --- Professional ---                           K22.4, Dyskinesia of esophagus  R13.10, Dysphagia, unspecified CPT copyright 2022 [employer]. All rights reserved. The codes documented in this report are preliminary and upon coder review may  be revised to meet current compliance requirements. Oliva Boots, MD 05/22/2024 4:34:04 PM This report has been signed electronically. Number of Addenda: 0

## 2024-05-22 NOTE — TOC Progression Note (Addendum)
 Transition of Care [facility]) - Progression Note    Patient Details  Name: Thomas Ferrell MRN: [id number] Date of Birth: 29-Jul-1941  Transition of Care [facility]) CM/SW Contact  Thomas Ferrell, [place] Phone Number: 05/22/2024, 11:24 AM  Clinical Narrative:   Per [facility], they do not have bed availability at this time. CSW communicated this with patients spouse, Thomas Ferrell, and asked about secondary choice for SNF. Thomas Ferrell stated she is unsure if SNF will be the plan at this time and family has a meeting with Palliative today. CSW will follow up at a later time regarding plans for potential discharge.   1:42 PM Per bedside RN, patient is not medically ready. CSW notified [facility] as they asked if patient needed a bed today, despite having no bed availability.   TOC will continue to follow.    Expected Discharge Plan: Skilled Nursing Facility Barriers to Discharge: Continued Medical Work up, SNF Pending bed offer  Expected Discharge Plan and Services In-house Referral: Clinical Social Work   Post Acute Care Choice: Skilled Nursing Facility (per wife) Living arrangements for the past 2 months: Single Family Home                                       Social Determinants of Health (SDOH) Interventions SDOH Screenings   Food Insecurity: No Food Insecurity (05/18/2024)  Housing: Low Risk  (05/18/2024)  Transportation Needs: No Transportation Needs (05/18/2024)  Utilities: Not At Risk (05/18/2024)  Financial Resource Strain: Low Risk  (04/28/2024)  Social Connections: Patient Unable To Answer (05/18/2024)  Recent Concern: Social Connections - Moderately Isolated (03/18/2024)  Tobacco Use: Medium Risk (05/22/2024)  Health Literacy: Adequate Health Literacy (04/28/2024)    Readmission Risk Interventions    03/19/2024    1:30 PM  Readmission Risk Prevention Plan  Post Dischage Appt Complete  Medication Screening Complete  Transportation Screening Complete

## 2024-05-22 NOTE — Progress Notes (Signed)
 SLP Cancellation Note  Patient Details Name: Thomas Ferrell MRN: [id number] DOB: 1941-06-23   Cancelled treatment:    Pt is NPO for endo today. SLP will follow.  Giles Currie L. Vona, MA CCC/SLP Clinical Specialist - Acute Care SLP Acute Rehabilitation Services Office number [phone]        Vona Palma Laurice 05/22/2024, 1:25 PM

## 2024-05-22 NOTE — Consult Note (Signed)
 Reason for Consult: Dysphagia Referring Physician: Hospital team  Thomas Ferrell is an 83 y.o. male.  HPI: Patient seen and examined and his hospital computer chart reviewed and he has had a swallowing problem he says for a long time and does not remember ever having and endoscopy and he is currently admitted for aspiration pneumonia and he has no other abdominal symptoms and his modified swallowing test was reviewed  Past Medical History:  Diagnosis Date   Abnormality of gait 03/03/2013   bladder ca dx'd 11/2009   chemo/xrt comp 02/2010   Cerebrovascular disease    right brain CVA 2007   COPD (chronic obstructive pulmonary disease) (HCC)    Cough productive of clear sputum    TX RECENT SINUS INFECTION    CVA (cerebral vascular accident) (HCC) 2007   r-cva   Dyslipidemia    Hypertension    Lumbago     Past Surgical History:  Procedure Laterality Date   ABDOMINAL AORTAGRAM N/A 12/07/2013   Procedure: ABDOMINAL EZELLA;  Surgeon: Lonni GORMAN Blade, MD;  Location: [facility] CATH LAB;  Service: Cardiovascular;  Laterality: N/A;   ABDOMINAL AORTOGRAM W/LOWER EXTREMITY N/A 06/26/2023   Procedure: ABDOMINAL AORTOGRAM W/LOWER EXTREMITY;  Surgeon: Lanis Fonda BRAVO, MD;  Location: [facility] INVASIVE CV LAB;  Service: Cardiovascular;  Laterality: N/A;   BACK SURGERY  2010   bladder cancer     CAROTID ARTERY ANGIOPLASTY  2009   CHOLECYSTECTOMY N/A 06/06/2016   Procedure: LAPAROSCOPIC CHOLECYSTECTOMY WITH INTRAOPERATIVE CHOLANGIOGRAM;  Surgeon: Krystal Spinner, MD;  Location: [facility] ORS;  Service: General;  Laterality: N/A;   ERCP N/A 06/05/2016   Procedure: ENDOSCOPIC RETROGRADE CHOLANGIOPANCREATOGRAPHY (ERCP);  Surgeon: Norleen Hint, MD;  Location: [facility];  Service: Endoscopy;  Laterality: N/A;   ESOPHAGOGASTRODUODENOSCOPY (EGD) WITH PROPOFOL  N/A 06/05/2016   Procedure: ESOPHAGOGASTRODUODENOSCOPY (EGD);  Surgeon: Norleen Hint, MD;  Location: [facility];  Service: Endoscopy;  Laterality: N/A;   FEMORAL-FEMORAL  BYPASS GRAFT N/A 12/09/2013   Procedure: BYPASS GRAFT FEMORAL-FEMORAL ARTERY WITH BILATERAL ENDARTERECTOMY ;  Surgeon: Carlin BRAVO Haddock, MD;  Location[facility] OR;  Service: Vascular;  Laterality: N/A;   FEMORAL-POPLITEAL BYPASS GRAFT Left 12/09/2013   Procedure: BYPASS GRAFT FEMORAL-POPLITEAL ARTERY - LEFT ;  Surgeon: Carlin BRAVO Haddock, MD;  Location: [facility] OR;  Service: Vascular;  Laterality: Left;   LAMINECTOMY  09/08/2013   L 2 L3 L4 L5       Dr Amon   LOWER EXTREMITY ANGIOGRAPHY Right 07/03/2023   Procedure: Lower Extremity Angiography;  Surgeon: Lanis Fonda BRAVO, MD;  Location: [facility] INVASIVE CV LAB;  Service: Cardiovascular;  Laterality: Right;   PERIPHERAL VASCULAR BALLOON ANGIOPLASTY Right 07/03/2023   Procedure: PERIPHERAL VASCULAR BALLOON ANGIOPLASTY;  Surgeon: Lanis Fonda BRAVO, MD;  Location: [facility] INVASIVE CV LAB;  Service: Cardiovascular;  Laterality: Right;  R external iliac    Family History  Problem Relation Age of Onset   Stroke Mother    Cancer Father    Dementia Father    Cancer Sister     Social History:  reports that he quit smoking about 18 years ago. His smoking use included cigarettes. He has never been exposed to tobacco smoke. He has never used smokeless tobacco. He reports that he does not drink alcohol  and does not use drugs.  Allergies:  Allergies  Allergen Reactions   Hydrocodone  Other (See Comments)    hallucinations   Morphine  And Codeine Itching and Other (See Comments)    Hallucinations     Medications: I have reviewed  the patient's current medications.  Results for orders placed or performed during the hospital encounter of 05/18/24 (from the past 48 hours)  Brain natriuretic peptide     Status: Abnormal   Collection Time: 05/20/24  3:25 PM  Result Value Ref Range   B Natriuretic Peptide 1,742.6 (H) 0.0 - 100.0 pg/mL    Comment: Performed at [facility], [address], [place], [place] [address]  CBC with Differential/Platelet     Status: Abnormal    Collection Time: 05/21/24 12:28 AM  Result Value Ref Range   WBC 15.7 (H) 4.0 - 10.5 K/uL   RBC 2.99 (L) 4.22 - 5.81 MIL/uL   Hemoglobin 9.7 (L) 13.0 - 17.0 g/dL   HCT 70.1 (L) 60.9 - 47.9 %   MCV 99.7 80.0 - 100.0 fL   MCH 32.4 26.0 - 34.0 pg   MCHC 32.6 30.0 - 36.0 g/dL   RDW 82.7 (H) 88.4 - 84.4 %   Platelets 300 150 - 400 K/uL   nRBC 0.0 0.0 - 0.2 %   Neutrophils Relative % 88 %   Neutro Abs 13.8 (H) 1.7 - 7.7 K/uL   Lymphocytes Relative 4 %   Lymphs Abs 0.7 0.7 - 4.0 K/uL   Monocytes Relative 7 %   Monocytes Absolute 1.1 (H) 0.1 - 1.0 K/uL   Eosinophils Relative 0 %   Eosinophils Absolute 0.0 0.0 - 0.5 K/uL   Basophils Relative 0 %   Basophils Absolute 0.0 0.0 - 0.1 K/uL   Immature Granulocytes 1 %   Abs Immature Granulocytes 0.13 (H) 0.00 - 0.07 K/uL    Comment: Performed at [facility], [address] [place], [place] [address]  Comprehensive metabolic panel with GFR     Status: Abnormal   Collection Time: 05/21/24 12:28 AM  Result Value Ref Range   Sodium 138 135 - 145 mmol/L   Potassium 4.2 3.5 - 5.1 mmol/L   Chloride 102 98 - 111 mmol/L   CO2 22 22 - 32 mmol/L   Glucose, Bld 130 (H) 70 - 99 mg/dL    Comment: Glucose reference range applies only to samples taken after fasting for at least 8 hours.   BUN 13 8 - 23 mg/dL   Creatinine, Ser 9.25 0.61 - 1.24 mg/dL   Calcium  8.5 (L) 8.9 - 10.3 mg/dL   Total Protein 6.1 (L) 6.5 - 8.1 g/dL   Albumin 2.9 (L) 3.5 - 5.0 g/dL   AST 53 (H) 15 - 41 U/L   ALT 41 0 - 44 U/L   Alkaline Phosphatase 65 38 - 126 U/L   Total Bilirubin 0.9 0.0 - 1.2 mg/dL   GFR, Estimated >39 >39 mL/min    Comment: (NOTE) Calculated using the CKD-EPI Creatinine Equation (2021)    Anion gap 14 5 - 15    Comment: Performed at [facility], [address] [place], [place] [address]  Magnesium      Status: Abnormal   Collection Time: 05/21/24 12:28 AM  Result Value Ref Range   Magnesium  1.1 (L) 1.7 - 2.4 mg/dL    Comment: Performed  at [facility], [address] [place], [place] [address]  Phosphorus     Status: None   Collection Time: 05/21/24 12:28 AM  Result Value Ref Range   Phosphorus 2.5 2.5 - 4.6 mg/dL    Comment: Performed at [facility], [address], [place], [place] [address]  Brain natriuretic peptide  Status: Abnormal   Collection Time: 05/21/24 12:28 AM  Result Value Ref Range   B Natriuretic Peptide 1,768.7 (H) 0.0 - 100.0 pg/mL    Comment: Performed at [facility], [address] [place], [place] [address]  Procalcitonin     Status: None   Collection Time: 05/21/24 12:28 AM  Result Value Ref Range   Procalcitonin 0.38 ng/mL    Comment:        Interpretation: PCT (Procalcitonin) <= 0.5 ng/mL: Systemic infection (sepsis) is not likely. Local bacterial infection is possible. (NOTE)       Sepsis PCT Algorithm           Lower Respiratory Tract                                      Infection PCT Algorithm    ----------------------------     ----------------------------         PCT < 0.25 ng/mL                PCT < 0.10 ng/mL          Strongly encourage             Strongly discourage   discontinuation of antibiotics    initiation of antibiotics    ----------------------------     -----------------------------       PCT 0.25 - 0.50 ng/mL            PCT 0.10 - 0.25 ng/mL               OR       >80% decrease in PCT            Discourage initiation of                                            antibiotics      Encourage discontinuation           of antibiotics    ----------------------------     -----------------------------         PCT >= 0.50 ng/mL              PCT 0.26 - 0.50 ng/mL               AND        <80% decrease in PCT             Encourage initiation of                                             antibiotics       Encourage continuation           of antibiotics    ----------------------------     -----------------------------        PCT >= 0.50 ng/mL                   PCT > 0.50 ng/mL               AND         increase in PCT  Strongly encourage                                      initiation of antibiotics    Strongly encourage escalation           of antibiotics                                     -----------------------------                                           PCT <= 0.25 ng/mL                                                 OR                                        > 80% decrease in PCT                                      Discontinue / Do not initiate                                             antibiotics  Performed at [facility], [address] [place], [place] [address]   Blood gas, venous     Status: Abnormal   Collection Time: 05/21/24 12:34 PM  Result Value Ref Range   pH, Ven 7.48 (H) 7.25 - 7.43   pCO2, Ven 45 44 - 60 mmHg   pO2, Ven <31 (LL) 32 - 45 mmHg    Comment: CRITICAL RESULT CALLED TO, READ BACK BY AND VERIFIED WITH: RN [facility] TABRON @ 1301 05/21/2024 BY BARON J.    Bicarbonate 33.5 (H) 20.0 - 28.0 mmol/L   Acid-Base Excess 8.8 (H) 0.0 - 2.0 mmol/L   O2 Saturation 18.4 %   Patient temperature 36.8    Collection site R. ARM    Drawn by MARRY LANES [phone]     Comment: Performed at [facility], [address], [place], [place] [address]  CBC with Differential/Platelet     Status: Abnormal   Collection Time: 05/22/24  2:51 AM  Result Value Ref Range   WBC 11.6 (H) 4.0 - 10.5 K/uL   RBC 3.02 (L) 4.22 - 5.81 MIL/uL   Hemoglobin 9.6 (L) 13.0 - 17.0 g/dL   HCT 70.0 (L) 60.9 - 47.9 %   MCV 99.0 80.0 - 100.0 fL   MCH 31.8 26.0 - 34.0 pg   MCHC 32.1 30.0 - 36.0 g/dL   RDW 83.0 (H) 88.4 - 84.4 %   Platelets 292 150 - 400 K/uL   nRBC 0.0 0.0 - 0.2 %   Neutrophils Relative % 87 %   Neutro Abs 10.1 (H) 1.7 - 7.7 K/uL  Lymphocytes Relative 5 %   Lymphs Abs 0.5 (L) 0.7 - 4.0 K/uL   Monocytes Relative 6 %   Monocytes Absolute 0.7 0.1 - 1.0 K/uL   Eosinophils Relative 1 %    Eosinophils Absolute 0.1 0.0 - 0.5 K/uL   Basophils Relative 0 %   Basophils Absolute 0.0 0.0 - 0.1 K/uL   Immature Granulocytes 1 %   Abs Immature Granulocytes 0.10 (H) 0.00 - 0.07 K/uL    Comment: Performed at [facility], [address] [place], [place] [address]  Comprehensive metabolic panel with GFR     Status: Abnormal   Collection Time: 05/22/24  2:51 AM  Result Value Ref Range   Sodium 141 135 - 145 mmol/L   Potassium 3.6 3.5 - 5.1 mmol/L   Chloride 99 98 - 111 mmol/L   CO2 30 22 - 32 mmol/L   Glucose, Bld 111 (H) 70 - 99 mg/dL    Comment: Glucose reference range applies only to samples taken after fasting for at least 8 hours.   BUN 15 8 - 23 mg/dL   Creatinine, Ser 9.22 0.61 - 1.24 mg/dL   Calcium  8.5 (L) 8.9 - 10.3 mg/dL   Total Protein 5.9 (L) 6.5 - 8.1 g/dL   Albumin 2.7 (L) 3.5 - 5.0 g/dL   AST 49 (H) 15 - 41 U/L   ALT 47 (H) 0 - 44 U/L   Alkaline Phosphatase 74 38 - 126 U/L   Total Bilirubin 1.1 0.0 - 1.2 mg/dL   GFR, Estimated >39 >39 mL/min    Comment: (NOTE) Calculated using the CKD-EPI Creatinine Equation (2021)    Anion gap 12 5 - 15    Comment: Performed at [facility], [address], [place], [place] [address]  Magnesium      Status: None   Collection Time: 05/22/24  2:51 AM  Result Value Ref Range   Magnesium  2.1 1.7 - 2.4 mg/dL    Comment: Performed at [facility] Lab, [address], [place], [place] [address]  Phosphorus     Status: None   Collection Time: 05/22/24  2:51 AM  Result Value Ref Range   Phosphorus 3.4 2.5 - 4.6 mg/dL    Comment: Performed at [facility], [address] [place], [place] [address]    ECHOCARDIOGRAM LIMITED Result Date: 05/21/2024    ECHOCARDIOGRAM LIMITED REPORT   Patient Name:   Thomas Ferrell Date of Exam: 05/21/2024 Medical Rec #:  [id number]      Height:       64.0 in Accession #:    [id number]     Weight:       105.6 lb Date of Birth:  13-May-1941     BSA:          1.491 m Patient Age:    82  years       BP:           184/69 mmHg Patient Gender: M              HR:           91 bpm. Exam Location:  Inpatient Procedure: Limited Color Doppler and Cardiac Doppler (Both Spectral and Color            Flow Doppler were utilized during procedure). Indications:    Other abnormalities of the heart  History:        Patient has prior history of Echocardiogram examinations, most  recent 03/18/2024. Pneumonia.  Sonographer:    Benard Stallion Referring Phys: [phone] A CALDWELL POWELL JR IMPRESSIONS  1. Left ventricular ejection fraction, by estimation, is 60 to 65%. The left ventricle has normal function. Left ventricular diastolic parameters are consistent with Grade II diastolic dysfunction (pseudonormalization).  2. Left atrial size was mildly dilated.  3. The mitral valve is normal in structure. Mild mitral valve regurgitation.  4. There is moderate calcification of the aortic valve. Mild aortic valve stenosis. Aortic valve area, by VTI measures 1.45 cm. Aortic valve mean gradient measures 10.0 mmHg. Aortic valve Vmax measures 2.12 m/s.  5. The inferior vena cava is normal in size with greater than 50% respiratory variability, suggesting right atrial pressure of 3 mmHg. FINDINGS  Left Ventricle: Left ventricular ejection fraction, by estimation, is 60 to 65%. The left ventricle has normal function. Left ventricular diastolic parameters are consistent with Grade II diastolic dysfunction (pseudonormalization). Left Atrium: Left atrial size was mildly dilated. Mitral Valve: The mitral valve is normal in structure. Mild mitral valve regurgitation. Tricuspid Valve: The tricuspid valve is normal in structure. Tricuspid valve regurgitation is trivial. Aortic Valve: There is moderate calcification of the aortic valve. Mild aortic stenosis is present. Aortic valve mean gradient measures 10.0 mmHg. Aortic valve peak gradient measures 18.1 mmHg. Aortic valve area, by VTI measures 1.45 cm. Pulmonic Valve: The  pulmonic valve was grossly normal. Pulmonic valve regurgitation is trivial. Venous: The inferior vena cava is normal in size with greater than 50% respiratory variability, suggesting right atrial pressure of 3 mmHg. LEFT VENTRICLE PLAX 2D LVIDd:         4.20 cm   Diastology LVIDs:         2.90 cm   LV e' medial:    7.40 cm/s LV PW:         1.00 cm   LV E/e' medial:  14.2 LV IVS:        1.00 cm   LV e' lateral:   10.10 cm/s LVOT diam:     2.00 cm   LV E/e' lateral: 10.4 LV SV:         62 LV SV Index:   42 LVOT Area:     3.14 cm  RIGHT VENTRICLE RV S prime:     12.90 cm/s TAPSE (M-mode): 2.6 cm LEFT ATRIUM             Index        RIGHT ATRIUM           Index LA diam:        3.50 cm 2.35 cm/m   RA Area:     18.00 cm LA Vol (A2C):   47.2 ml 31.65 ml/m  RA Volume:   50.50 ml  33.86 ml/m LA Vol (A4C):   35.7 ml 23.94 ml/m LA Biplane Vol: 43.8 ml 29.37 ml/m  AORTIC VALVE AV Area (Vmax):    1.40 cm AV Area (Vmean):   1.40 cm AV Area (VTI):     1.45 cm AV Vmax:           212.50 cm/s AV Vmean:          145.250 cm/s AV VTI:            0.427 m AV Peak Grad:      18.1 mmHg AV Mean Grad:      10.0 mmHg LVOT Vmax:         94.90 cm/s LVOT Vmean:  64.900 cm/s LVOT VTI:          0.198 m LVOT/AV VTI ratio: 0.46  AORTA Ao Root diam: 2.90 cm Ao Asc diam:  3.40 cm MITRAL VALVE                TRICUSPID VALVE MV Area (PHT): 3.93 cm     TR Peak grad:   47.1 mmHg MV Decel Time: 193 msec     TR Vmax:        343.00 cm/s MR Peak grad: 92.9 mmHg MR Vmax:      482.00 cm/s   SHUNTS MV E velocity: 105.00 cm/s  Systemic VTI:  0.20 m MV A velocity: 86.50 cm/s   Systemic Diam: 2.00 cm MV E/A ratio:  1.21 Toribio Fuel MD Electronically signed by Toribio Fuel MD Signature Date/Time: 05/21/2024/7:28:54 PM    Final    DG Swallowing Func-Speech Pathology Result Date: 05/21/2024 Table formatting from the original result was not included. Images from the original result were not included. Modified Barium Swallow Study Patient  Details Name: Thomas Ferrell MRN: [id number] Date of Birth: [birth year] Today's Date: 05/21/2024 HPI/PMH: HPI: Pt is an 82 y/o male who [facility]/18[facility]24 post ground level fall. CXR shows Progressive superimposed left lower lobe airspace disease is consistent with  pneumonia or aspiration pneumonitis. Pt has been see in prior visits for chronic coughing. Has not had any instrumental testing.  PMH significant for PAD, carotid artery stenosis, HTN, prior history of tobacco abuse, stroke/TIA, COPD, anemia.  Recent hospitalization 5/25 for SOB and COPD. Clinical Impression: Pt presents with a moderate-severe pharyngeal dysphagia and concerns for a primary esophageal dysphagia per results of MBSS completed today. There was aspiration of thin liquids, nectar-thick liquids, and honey-thick liquid residue vs puree vs mixture. Majority of aspiration events were silent with occasional delayed throat clearing observed following some aspiration events of thin liquids. Oral phase judged to be grossly WNL. Pharyngeal deficits included mistiming of swallow initiation vs incomplete closure of laryngeal vestibule contributing to penetration/aspiration events. There was reduced pharyngeal stripping resulting in min residue in vallecular space and posterior pharyngeal wall post swallow. Concern for esophageal dysphagia included significant and progressive retention of liquid and solid residue throughout the esophagus. There was x1 instance of backflow of thin liquids through the UES that briefly penetrated the airway before being re-swallowed. Image below of esophageal sweep at end of study. Poor esophageal clearance of liquids and solids. Recommend GI consult. SLP to follow up to trial 5cc Provale cup with pt to determine if pt can have small sips of thin liquids. Diet order pending based on tolerance. Updated RN and MD on MBSS results and recommendations. Factors that may increase risk of adverse event in presence of aspiration  Noe & Lianne 2021): Factors that may increase risk of adverse event in presence of aspiration Noe & Lianne 2021): Respiratory or GI disease; Frail or deconditioned; Limited mobility; Weak cough; Frequent aspiration of large volumes Recommendations/Plan: Swallowing Evaluation Recommendations Swallowing Evaluation Recommendations Recommendations: PO diet; Ice chips PRN after oral care PO Diet Recommendation: Thin liquids (Level 0); Clear liquid diet (via spooon or 5cc Provale cup) Medication Administration: Crushed with puree Supervision: Patient able to self-feed Swallowing strategies  : Slow rate; Small bites/sips (liquids by spoon or 5cc Provale cup only) Postural changes: Position pt fully upright for meals; Stay upright 30-60 min after meals Oral care recommendations: Oral care QID (4x/day) Recommended consults: Consider GI consultation Treatment Plan Treatment Plan Treatment  recommendations: Therapy as outlined in treatment plan below Follow-up recommendations: -- (TBD) Functional status assessment: Patient has had a recent decline in their functional status and demonstrates the ability to make significant improvements in function in a reasonable and predictable amount of time. Treatment frequency: Min 2x/week Treatment duration: 4 weeks Interventions: Aspiration precaution training; Diet toleration management by SLP; Patient/family education Recommendations Recommendations for follow up therapy are one component of a multi-disciplinary discharge planning process, led by the attending physician.  Recommendations may be updated based on patient status, additional functional criteria and insurance authorization. Assessment: Orofacial Exam: Orofacial Exam Oral Cavity: Oral Hygiene: WFL Oral Cavity - Dentition: Dentures, top; Dentures, bottom Orofacial Anatomy: WFL Oral Motor/Sensory Function: WFL Anatomy: No data recorded Boluses Administered: Boluses Administered Boluses Administered: Thin liquids (Level  0); Mildly thick liquids (Level 2, nectar thick); Moderately thick liquids (Level 3, honey thick); Puree; Solid  Oral Impairment Domain: Oral Impairment Domain Lip Closure: No labial escape Tongue control during bolus hold: Posterior escape of less than half of bolus Bolus preparation/mastication: Timely and efficient chewing and mashing Bolus transport/lingual motion: Delayed initiation of tongue motion (oral holding) Oral residue: Trace residue lining oral structures Location of oral residue : Tongue Initiation of pharyngeal swallow : Valleculae  Pharyngeal Impairment Domain: Pharyngeal Impairment Domain Soft palate elevation: No bolus between soft palate (SP)/pharyngeal wall (PW) Laryngeal elevation: Complete superior movement of thyroid  cartilage with complete approximation of arytenoids to epiglottic petiole Anterior hyoid excursion: Complete anterior movement Epiglottic movement: Complete inversion Laryngeal vestibule closure: Incomplete, narrow column air/contrast in laryngeal vestibule Pharyngeal stripping wave : Present - diminished Pharyngeal contraction (A/P view only): N/A Pharyngoesophageal segment opening: Complete distension and complete duration, no obstruction of flow Tongue base retraction: Trace column of contrast or air between tongue base and PPW Pharyngeal residue: Trace residue within or on pharyngeal structures Location of pharyngeal residue: Valleculae; Tongue base  Esophageal Impairment Domain: Esophageal Impairment Domain Esophageal clearance upright position: Esophageal retention with retrograde flow through the PES; Minimal to no esophageal clearance Pill: Pill Consistency administered: -- (not administered) Penetration/Aspiration Scale Score: Penetration/Aspiration Scale Score 1.  Material does not enter airway: Thin liquids (Level 0); Solid 2.  Material enters airway, remains ABOVE vocal cords then ejected out: Moderately thick liquids (Level 3, honey thick) 5.  Material enters airway,  CONTACTS cords and not ejected out: Mildly thick liquids (Level 2, nectar thick) 6.  Material enters airway, passes BELOW cords then ejected out: Mildly thick liquids (Level 2, nectar thick) 8.  Material enters airway, passes BELOW cords without attempt by patient to eject out (silent aspiration) : Moderately thick liquids (Level 3, honey thick); Puree; Thin liquids (Level 0) Compensatory Strategies: Compensatory Strategies Compensatory strategies: Yes Straw: Ineffective Ineffective Straw: Thin liquid (Level 0) Multiple swallows: Ineffective Chin tuck: Ineffective Ineffective Chin Tuck: Thin liquid (Level 0)   General Information: No data recorded Diet Prior to this Study: Regular; Thin liquids (Level 0)   No data recorded  No data recorded  No data recorded  No data recorded Behavior/Cognition: Alert; Cooperative No data recorded No data recorded No data recorded Volitional Swallow: Able to elicit Exam Limitations: No limitations Goal Planning: Prognosis for improved oropharyngeal function: Fair No data recorded No data recorded No data recorded Consulted and agree with results and recommendations: Patient; Nurse; Physician Pain: Pain Assessment Pain Assessment: No/denies pain End of Session: Start Time:SLP Start Time (ACUTE ONLY): 0805 Stop Time: SLP Stop Time (ACUTE ONLY): 9164 Time Calculation:SLP Time Calculation (min) (ACUTE  ONLY): 30 min Charges: SLP Evaluations $ SLP Speech Visit: 1 Visit SLP Evaluations $BSS Swallow: 1 Procedure $MBS Swallow: 1 Procedure SLP visit diagnosis: SLP Visit Diagnosis: Dysphagia, pharyngoesophageal phase (R13.14) Past Medical History: Past Medical History: Diagnosis Date  Abnormality of gait 03/03/2013  bladder ca dx'd 11/2009  chemo/xrt comp 02/2010  Cerebrovascular disease   right brain CVA 2007  COPD (chronic obstructive pulmonary disease) (HCC)   Cough productive of clear sputum   TX RECENT SINUS INFECTION   CVA (cerebral vascular accident) (HCC) 2007  r-cva  Dyslipidemia    Hypertension   Lumbago  Past Surgical History: Past Surgical History: Procedure Laterality Date  ABDOMINAL AORTAGRAM N/A 12/07/2013  Procedure: ABDOMINAL EZELLA;  Surgeon: Lonni GORMAN Blade, MD;  Location: [facility] C[facility] LAB;  Service: Cardiovascular;  Laterality: N/A;  ABDOMINAL AORTOGRAM W/LOWER EXTREMITY N/A 06/26/2023  Procedure: ABDOMINAL AORTOGRAM W/LOWER EXTREMITY;  Surgeon: Lanis Fonda BRAVO, MD;  Location: [facility] INVASIVE CV LAB;  Service: Cardiovascular;  Laterality: N/A;  BACK SURGERY  2010  bladder cancer    CAROTID ARTERY ANGIOPLASTY  2009  CHOLECYSTECTOMY N/A 06/06/2016  Procedure: LAPAROSCOPIC CHOLECYSTECTOMY WITH INTRAOPERATIVE CHOLANGIOGRAM;  Surgeon: Krystal Spinner, MD;  Location: [facility];  Service: General;  Laterality: N/A;  ERCP N/A 06/05/2016  Procedure: ENDOSCOPIC RETROGRADE CHOLANGIOPANCREATOGRAPHY (ERCP);  Surgeon: Norleen Hint, MD;  Location: [facility];  Service: Endoscopy;  Laterality: N/A;  ESOPHAGOGASTRODUODENOSCOPY (EGD) WITH PROPOFOL  N/A 06/05/2016  Procedure: ESOPHAGOGASTRODUODENOSCOPY (EGD);  Surgeon: Norleen Hint, MD;  Location: [facility];  Service: Endoscopy;  Laterality: N/A;  FEMORAL-FEMORAL BYPASS GRAFT N/A 12/09/2013  Procedure: BYPASS GRAFT FEMORAL-FEMORAL ARTERY WITH BILATERAL ENDARTERECTOMY ;  Surgeon: Carlin BRAVO Haddock, MD;  Location: [facility] OR;  Service: Vascular;  Laterality: N/A;  FEMORAL-POPLITEAL BYPASS GRAFT Left 12/09/2013  Procedure: BYPASS GRAFT FEMORAL-POPLITEAL ARTERY - LEFT ;  Surgeon: Carlin BRAVO Haddock, MD;  Location: [facility] OR;  Service: Vascular;  Laterality: Left;  LAMINECTOMY  09/08/2013  L 2 L3 L4 L5       Dr Amon  LOWER EXTREMITY ANGIOGRAPHY Right 07/03/2023  Procedure: Lower Extremity Angiography;  Surgeon: Lanis Fonda BRAVO, MD;  Location: [facility] INVASIVE CV LAB;  Service: Cardiovascular;  Laterality: Right;  PERIPHERAL VASCULAR BALLOON ANGIOPLASTY Right 07/03/2023  Procedure: PERIPHERAL VASCULAR BALLOON ANGIOPLASTY;  Surgeon: Lanis Fonda BRAVO, MD;  Location: [facility] INVASIVE CV LAB;   Service: Cardiovascular;  Laterality: Right;  R external iliac [name] 05/21/2024, 10:24 AM  DG Chest Port 1 View Result Date: 05/20/2024 CLINICAL DATA:  Shortness of breath EXAM: PORTABLE CHEST 1 VIEW COMPARISON:  05/20/2024 FINDINGS: Elevation of left hemidiaphragm. Heart size and pulmonary vascularity are normal. Emphysematous changes and fibrosis in the lungs. Suggestion of superimposed airspace disease on the left, possibly superimposed pneumonia right asymmetrical edema. No pleural effusion or pneumothorax. Similar appearance to previous study. Calcification of the aorta. Postoperative changes in the lumbar spine. IMPRESSION: No change since previous study. 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 Electronically Signed   By: Elsie Gravely M.D.   On: 05/20/2024 23:24   DG CHEST PORT 1 VIEW Result Date: 05/20/2024 CLINICAL DATA:  Shortness of breath. EXAM: PORTABLE CHEST 1 VIEW COMPARISON:  Chest radiograph dated 05/18/2024. FINDINGS: Background of emphysema and chronic Scholl coarsening. Bilateral reticulonodular airspace densities, left greater than right, and significantly increased since the prior radiograph consistent with pneumonia. Aspiration is not excluded. No pleural effusion pneumothorax. The cardiac silhouette is within limits. Atherosclerotic calcification of the aorta. No acute osseous  pathology. IMPRESSION: Progression of pulmonary infiltrates. Electronically Signed   By: Vanetta Chou M.D.   On: 05/20/2024 18:34    ROS negative except above Blood pressure (!) 157/68, pulse (!) 59, temperature 97.6 F (36.4 C), temperature source Oral, resp. rate 17, height 5' 4 (1.626 m), weight 47.7 kg, SpO2 96%. Physical Exam vital signs stable afebrile no acute distress exam please see preassessment evaluation chemistries okay very minimal elevated transaminases white count decreased hemoglobin stable iron indices  compatible with chronic disease  Assessment/Plan: Multiple medical problems including aspiration pneumonia and failed swallowing test Plan: We discussed endoscopy later today and he would like to proceed with further workup and plans pending those findings  Keyanah Kozicki E 05/22/2024, 9:42 AM

## 2024-05-22 NOTE — Progress Notes (Addendum)
 PROGRESS NOTE    [name]  FMW:[id number] DOB: [birth year] DOA: 05/18/2024 PCP: [name], MD  Chief Complaint  Patient presents with   Fall    Brief Narrative:   Thomas Ferrell is an 83 y.o. male past medical history significant for peripheral arterial disease, carotid stenosis status post bilateral common femoral endarterectomy and bypass, essential hypertension, history of TIA, paroxysmal atrial fibrillation on Eliquis  COPD on 2 L of oxygen  at home per patient's report failure to thrive he called EMS as he could not get get up from the floor he did not hit his head.  White blood cell count of 23,000 chest x-ray showed left lower lobe consolidation   Assessment & Plan:   Principal Problem:   Fall Active Problems:   CAP (community acquired pneumonia)   FTT (failure to thrive) in adult   PNA (pneumonia)   Pressure injury of skin   Malnutrition of moderate degree   Acute hypoxemic respiratory failure (HCC)  Addendum 6:41 PM Goals of Care Spoke to [name] few times over the phone today, her sons (mainly Thomas Ferrell) as well.  Tough situation with his aspiration and egd today with abnormal esophageal motility.  Based on his SLP eval yesterday, they'd recommended NPO status (had significant esophageal retention of liquids and solids.  Silent aspiration of thins, nectar thick, and honey thick residue vs puree.  We'd talked about him remaining NPO until tomorrow, but he requested to try some liquids.  We'd discussed risks of comfort feeds earlier and his family reviewed this with him.  They're agreeable to trial of clear liquids.  Will have SLP see him tmrw.   Acute Hypoxic Respiratory Failure secondary to Pneumonia Reportedly on 2 L chronically - need to clarify this with his wife CXR at presentation with superimposed LLL airspace disease c/w pneumonia or aspiration pneumonitis CXR 7/9 with chronic interstitial fibrosis with suggestion of superimposed airspace disease on the  L Ceftriaxone , doxycycline  - he's completed 5 day course.  Will see how he's doing tmrw, could consider extending course. Urine strep, legionella, MRSA PCR, sputum cx Blood cx NGTD SLP eval with concern for aspiration - suspect this is cause of his presentation  Elevated BNP  Concern for Volume Overload Clinically, doesn't appear overloaded, but BNP elevated from prior and worsening hypoxia with abnormal CXR Repeat echo with grade II diastolic dysfunction, EF 60-65%, mild AV stenosis, IVC normal with greater than 50% resp variability Will diurese as tolerated - hold after today  Dysphagia  Concern for Aspiration MBS 7/10, concern for poor esophageal clearance of liquids and solids GI has been consulted -> plan for EGD today SLP recommending NPO for now, meds crushed with puree  Addendum: EGD with abnormal esophageal motility, 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  Not [name] candidate for reglan or erythromycin at this time.  He's been agitated with lack of PO, was hoping there was going to be something we could intervene on and allow PO this afternoon.  This isn't the case.  Discussed options with wife/son -> conservative option is NPO until tmrw and repeat SLP eval.  Alternative is to allow PO for comfort with the knowledge that this will place him at risk for aspiration and complications of aspiration (including death).  Will start with recommendation for NPO until tmrw and see how this goes (I'm not sure he'll take this well given reports from earlier today - he mentioned not wanting anymore treatment at one  point).  If he's not agreeable to NPO until tmrw, I think comfort feeds reasonable.  His prognosis I think is poor with his risk of recurrent aspiration.   Acute Metabolic Encephalopathy Concern for Cognitive Deficit Per EDP note, hallucinating prior to admission Still confused, will work on better picture of baseline mental status - some memory issues the  last few months.  Issues with short term memory recently, getting progressively worse.  He's been combative here.   Neurology follow up outpatient - trial trazodone  for sleep Delirium precautions Ammonia, TSH, RPR.  B12 elevated.  VBG without hypercarbia.   Paroxysmal atrial fibrillation on Eliquis : Rate controlled metoprolol  - increased metoprolol  last night due to episode of RVR 7/10 - bradycardia overnight, will reduce metop back to 12.5 mg BID and holding parameters.  Will monitor.  Eliquis  on hold with his large hematoma below  Avoid NSAIDs   Iron Deficiency Anemia Check [name] B12 (elevated) and folate (wnl) Iron is low trend   Physical deconditioning/ Fall: PT OT consulted.  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   Right Forearm Hematoma 9.4 x 4.4 x 2.7 cm hematoma in subcutaneous tissues of proximal dorsal forearm - seems to be improving per discussion with his wife - relatively stable today again - per wife, has been this size for [name] while now Monitor off anticoagulation   Essential hypertension: Irbesartan , metoprolol  (holding parameters for bradycardia), spironolactone    Moderate Malnutrition  Noted, RD c/s Body mass index is 18.05 kg/m.  Dyslipidemia Hold statin for now  Goals of care Wife notes he has DNR Will c/s palliative care for additional GOC  Sacral decubitus ulcer present on admission unstageable    DVT prophylaxis: SCD Code Status: DNR after discussion with wife today Family Communication: wife over phone Disposition:   Status is: Inpatient Remains inpatient appropriate because: need for ongoing inpatient care   Consultants:  none  Procedures:  none  Antimicrobials:  Anti-infectives (From admission, onward)    Start     Dose/Rate Route Frequency Ordered Stop   05/19/24 1015  doxycycline  (VIBRA -TABS) tablet 100 mg        100 mg Oral Every 12 hours 05/19/24 0916 05/23/24 0959   05/19/24 0800  cefTRIAXone  (ROCEPHIN ) 1 g in sodium chloride  0.9 %  100 mL IVPB        1 g 200 mL/hr over 30 Minutes Intravenous Every 24 hours 05/18/24 1349 05/23/24 0759   05/18/24 1445  azithromycin  (ZITHROMAX ) tablet 500 mg  Status:  Discontinued        500 mg Oral Daily 05/18/24 1349 05/19/24 0916   05/18/24 0615  vancomycin  (VANCOCIN ) IVPB 1000 mg/200 mL premix        1,000 mg 200 mL/hr over 60 Minutes Intravenous  Once 05/18/24 0608 05/18/24 0733   05/18/24 0615  ceFEPIme  (MAXIPIME ) 2 g in sodium chloride  0.9 % 100 mL IVPB        2 g 200 mL/hr over 30 Minutes Intravenous  Once 05/18/24 0608 05/18/24 0656       Subjective: No new complaints Says he's still in this world  Asks when he can eat, wow when told not until after procedure   Objective: Vitals:   05/22/24 0103 05/22/24 0500 05/22/24 0720 05/22/24 0750  BP: (!) 154/66 (!) 160/65 (!) 157/68 (!) 157/68  Pulse: 76 65 81 (!) 59  Resp: 18 20 17    Temp: 97.6 F (36.4 C) 97.7 F (36.5 C) 97.6 F (36.4 C)   TempSrc:  Oral Oral Oral   SpO2: 93% 93% 99% 96%  Weight:  47.7 kg    Height:        Intake/Output Summary (Last 24 hours) at 05/22/2024 0809 Last data filed at 05/22/2024 0500 Gross per 24 hour  Intake 130 ml  Output 950 ml  Net -820 ml   Filed Weights   05/20/24 1700 05/21/24 0441 05/22/24 0500  Weight: 51.6 kg 47.9 kg 47.7 kg    Examination:  General: No acute distress. Cardiovascular: RRR Lungs: unlabored on 4 L, diminished anterior exam Abdomen: Soft, nontender, nondistended Neurological: Alert . Moves all extremities 4 with equal strength. Cranial nerves II through XII grossly intact. Extremities: No clubbing or cyanosis. No edema.  Data Reviewed: I have personally reviewed following labs and imaging studies  CBC: Recent Labs  Lab 05/18/24 0435 05/18/24 0742 05/20/24 0819 05/21/24 0028 05/22/24 0251  WBC 22.9* 13.7* 11.6* 15.7* 11.6*  NEUTROABS 19.3*  --  10.8* 13.8* 10.1*  HGB 9.3* 8.4* 8.8* 9.7* 9.6*  HCT 29.2* 27.2* 27.1* 29.8* 29.9*  MCV  104.3* 104.2* 99.3 99.7 99.0  PLT 357 220 244 300 292    Basic Metabolic Panel: Recent Labs  Lab 05/18/24 0208 05/18/24 0742 05/19/24 0640 05/20/24 0819 05/21/24 0028 05/22/24 0251  NA 136  --  139 138 138 141  K 4.3  --  3.5 3.3* 4.2 3.6  CL 105  --  111 105 102 99  CO2 20*  --  18* 22 22 30   GLUCOSE 109*  --  113* 166* 130* 111*  BUN 14  --  9 11 13 15   CREATININE 1.00 0.86 0.75 0.73 0.74 0.77  CALCIUM  7.6*  --  7.9* 8.3* 8.5* 8.5*  MG  --   --   --   --  1.1* 2.1  PHOS  --   --   --   --  2.5 3.4    GFR: Estimated Creatinine Clearance: 48.9 mL/min (by C-G formula based on SCr of 0.77 mg/dL).  Liver Function Tests: Recent Labs  Lab 05/18/24 0208 05/20/24 0819 05/21/24 0028 05/22/24 0251  AST 35 43* 53* 49*  ALT 25 33 41 47*  ALKPHOS 72 63 65 74  BILITOT 1.2 1.0 0.9 1.1  PROT 5.9* 5.3* 6.1* 5.9*  ALBUMIN 3.0* 2.5* 2.9* 2.7*    CBG: No results for input(s): GLUCAP in the last 168 hours.   Recent Results (from the past 240 hours)  Blood Culture (routine x 2)     Status: None (Preliminary result)   Collection Time: 05/18/24  1:21 AM   Specimen: BLOOD  Result Value Ref Range Status   Specimen Description BLOOD LEFT ARM  Final   Special Requests   Final    BOTTLES DRAWN AEROBIC AND ANAEROBIC Blood Culture adequate volume   Culture   Final    NO GROWTH 3 DAYS Performed at [facility], [address] [place], [place] [address]    Report Status PENDING  Incomplete  Blood Culture (routine x 2)     Status: None (Preliminary result)   Collection Time: 05/18/24  1:26 AM   Specimen: BLOOD RIGHT HAND  Result Value Ref Range Status   Specimen Description BLOOD RIGHT HAND  Final   Special Requests   Final    BOTTLES DRAWN AEROBIC AND ANAEROBIC Blood Culture adequate volume   Culture   Final    NO GROWTH 3 DAYS Performed at [facility], [address]  [place], [place] [address]    Report Status PENDING  Incomplete         Radiology  Studies: ECHOCARDIOGRAM LIMITED Result Date: 05/21/2024    ECHOCARDIOGRAM LIMITED REPORT   Patient Name:   DVONTE GATLIFF Date of Exam: 05/21/2024 Medical Rec #:  [id number]      Height:       64.0 in Accession #:    [id number]     Weight:       105.6 lb Date of Birth:  July 08, 1941     BSA:          1.491 m Patient Age:    82 years       BP:           184/69 mmHg Patient Gender: M              HR:           91 bpm. Exam Location:  Inpatient Procedure: Limited Color Doppler and Cardiac Doppler (Both Spectral and Color            Flow Doppler were utilized during procedure). Indications:    Other abnormalities of the heart  History:        Patient has prior history of Echocardiogram examinations, most                 recent 03/18/2024. Pneumonia.  Sonographer:    Benard Stallion Referring Phys: [phone] Khala Tarte CALDWELL POWELL JR IMPRESSIONS  1. Left ventricular ejection fraction, by estimation, is 60 to 65%. The left ventricle has normal function. Left ventricular diastolic parameters are consistent with Grade II diastolic dysfunction (pseudonormalization).  2. Left atrial size was mildly dilated.  3. The mitral valve is normal in structure. Mild mitral valve regurgitation.  4. There is moderate calcification of the aortic valve. Mild aortic valve stenosis. Aortic valve area, by VTI measures 1.45 cm. Aortic valve mean gradient measures 10.0 mmHg. Aortic valve Vmax measures 2.12 m/s.  5. The inferior vena cava is normal in size with greater than 50% respiratory variability, suggesting right atrial pressure of 3 mmHg. FINDINGS  Left Ventricle: Left ventricular ejection fraction, by estimation, is 60 to 65%. The left ventricle has normal function. Left ventricular diastolic parameters are consistent with Grade II diastolic dysfunction (pseudonormalization). Left Atrium: Left atrial size was mildly dilated. Mitral Valve: The mitral valve is normal in structure. Mild mitral valve regurgitation. Tricuspid Valve: The tricuspid  valve is normal in structure. Tricuspid valve regurgitation is trivial. Aortic Valve: There is moderate calcification of the aortic valve. Mild aortic stenosis is present. Aortic valve mean gradient measures 10.0 mmHg. Aortic valve peak gradient measures 18.1 mmHg. Aortic valve area, by VTI measures 1.45 cm. Pulmonic Valve: The pulmonic valve was grossly normal. Pulmonic valve regurgitation is trivial. Venous: The inferior vena cava is normal in size with greater than 50% respiratory variability, suggesting right atrial pressure of 3 mmHg. LEFT VENTRICLE PLAX 2D LVIDd:         4.20 cm   Diastology LVIDs:         2.90 cm   LV e' medial:    7.40 cm/s LV PW:         1.00 cm   LV E/e' medial:  14.2 LV IVS:        1.00 cm   LV e' lateral:   10.10 cm/s LVOT diam:     2.00 cm   LV E/e' lateral: 10.4 LV SV:  62 LV SV Index:   42 LVOT Area:     3.14 cm  RIGHT VENTRICLE RV S prime:     12.90 cm/s TAPSE (M-mode): 2.6 cm LEFT ATRIUM             Index        RIGHT ATRIUM           Index LA diam:        3.50 cm 2.35 cm/m   RA Area:     18.00 cm LA Vol (A2C):   47.2 ml 31.65 ml/m  RA Volume:   50.50 ml  33.86 ml/m LA Vol (A4C):   35.7 ml 23.94 ml/m LA Biplane Vol: 43.8 ml 29.37 ml/m  AORTIC VALVE AV Area (Vmax):    1.40 cm AV Area (Vmean):   1.40 cm AV Area (VTI):     1.45 cm AV Vmax:           212.50 cm/s AV Vmean:          145.250 cm/s AV VTI:            0.427 m AV Peak Grad:      18.1 mmHg AV Mean Grad:      10.0 mmHg LVOT Vmax:         94.90 cm/s LVOT Vmean:        64.900 cm/s LVOT VTI:          0.198 m LVOT/AV VTI ratio: 0.46  AORTA Ao Root diam: 2.90 cm Ao Asc diam:  3.40 cm MITRAL VALVE                TRICUSPID VALVE MV Area (PHT): 3.93 cm     TR Peak grad:   47.1 mmHg MV Decel Time: 193 msec     TR Vmax:        343.00 cm/s MR Peak grad: 92.9 mmHg MR Vmax:      482.00 cm/s   SHUNTS MV E velocity: 105.00 cm/s  Systemic VTI:  0.20 m MV Hershy Flenner velocity: 86.50 cm/s   Systemic Diam: 2.00 cm MV E/Sharri Loya ratio:  1.21  Toribio Fuel MD Electronically signed by Toribio Fuel MD Signature Date/Time: 05/21/2024/7:28:54 PM    Final    DG Swallowing Func-Speech Pathology Result Date: 05/21/2024 Table formatting from the original result was not included. Images from the original result were not included. Modified Barium Swallow Study Patient Details Name: MUNACHIMSO PALIN MRN: [id number] Date of Birth: [birth year] Today's Date: 05/21/2024 HPI/PMH: HPI: Pt is an 83 y/o male who [facility]24 post ground level fall. CXR shows Progressive superimposed left lower lobe airspace disease is consistent with  pneumonia or aspiration pneumonitis. Pt has been see in prior visits for chronic coughing. Has not had any instrumental testing.  PMH significant for PAD, carotid artery stenosis, HTN, prior history of tobacco abuse, stroke/TIA, COPD, anemia.  Recent hospitalization 5/25 for SOB and COPD. Clinical Impression: Pt presents with Joselyne Spake moderate-severe pharyngeal dysphagia and concerns for Dalin Caldera primary esophageal dysphagia per results of MBSS completed today. There was aspiration of thin liquids, nectar-thick liquids, and honey-thick liquid residue vs puree vs mixture. Majority of aspiration events were silent with occasional delayed throat clearing observed following some aspiration events of thin liquids. Oral phase judged to be grossly WNL. Pharyngeal deficits included mistiming of swallow initiation vs incomplete closure of laryngeal vestibule contributing to penetration/aspiration events. There was reduced pharyngeal stripping resulting in min residue in vallecular space and posterior pharyngeal  wall post swallow. Concern for esophageal dysphagia included significant and progressive retention of liquid and solid residue throughout the esophagus. There was x1 instance of backflow of thin liquids through the UES that briefly penetrated the airway before being re-swallowed. Image below of esophageal sweep at end of study. Poor esophageal  clearance of liquids and solids. Recommend GI consult. SLP to follow up to trial 5cc Provale cup with pt to determine if pt can have small sips of thin liquids. Diet order pending based on tolerance. Updated RN and MD on MBSS results and recommendations. Factors that may increase risk of adverse event in presence of aspiration Noe & Lianne 2021): Factors that may increase risk of adverse event in presence of aspiration Noe & Lianne 2021): Respiratory or GI disease; Frail or deconditioned; Limited mobility; Weak cough; Frequent aspiration of large volumes Recommendations/Plan: Swallowing Evaluation Recommendations Swallowing Evaluation Recommendations Recommendations: PO diet; Ice chips PRN after oral care PO Diet Recommendation: Thin liquids (Level 0); Clear liquid diet (via spooon or 5cc Provale cup) Medication Administration: Crushed with puree Supervision: Patient able to self-feed Swallowing strategies  : Slow rate; Small bites/sips (liquids by spoon or 5cc Provale cup only) Postural changes: Position pt fully upright for meals; Stay upright 30-60 min after meals Oral care recommendations: Oral care QID (4x/day) Recommended consults: Consider GI consultation Treatment Plan Treatment Plan Treatment recommendations: Therapy as outlined in treatment plan below Follow-up recommendations: -- (TBD) Functional status assessment: Patient has had Raed Schalk recent decline in their functional status and demonstrates the ability to make significant improvements in function in Olie Scaffidi reasonable and predictable amount of time. Treatment frequency: Min 2x/week Treatment duration: 4 weeks Interventions: Aspiration precaution training; Diet toleration management by SLP; Patient/family education Recommendations Recommendations for follow up therapy are one component of Kessa Fairbairn multi-disciplinary discharge planning process, led by the attending physician.  Recommendations may be updated based on patient status, additional functional  criteria and insurance authorization. Assessment: Orofacial Exam: Orofacial Exam Oral Cavity: Oral Hygiene: WFL Oral Cavity - Dentition: Dentures, top; Dentures, bottom Orofacial Anatomy: WFL Oral Motor/Sensory Function: WFL Anatomy: No data recorded Boluses Administered: Boluses Administered Boluses Administered: Thin liquids (Level 0); Mildly thick liquids (Level 2, nectar thick); Moderately thick liquids (Level 3, honey thick); Puree; Solid  Oral Impairment Domain: Oral Impairment Domain Lip Closure: No labial escape Tongue control during bolus hold: Posterior escape of less than half of bolus Bolus preparation/mastication: Timely and efficient chewing and mashing Bolus transport/lingual motion: Delayed initiation of tongue motion (oral holding) Oral residue: Trace residue lining oral structures Location of oral residue : Tongue Initiation of pharyngeal swallow : Valleculae  Pharyngeal Impairment Domain: Pharyngeal Impairment Domain Soft palate elevation: No bolus between soft palate (SP)/pharyngeal wall (PW) Laryngeal elevation: Complete superior movement of thyroid  cartilage with complete approximation of arytenoids to epiglottic petiole Anterior hyoid excursion: Complete anterior movement Epiglottic movement: Complete inversion Laryngeal vestibule closure: Incomplete, narrow column air/contrast in laryngeal vestibule Pharyngeal stripping wave : Present - diminished Pharyngeal contraction (Jayziah Bankhead/P view only): N/Jozlynn Plaia Pharyngoesophageal segment opening: Complete distension and complete duration, no obstruction of flow Tongue base retraction: Trace column of contrast or air between tongue base and PPW Pharyngeal residue: Trace residue within or on pharyngeal structures Location of pharyngeal residue: Valleculae; Tongue base  Esophageal Impairment Domain: Esophageal Impairment Domain Esophageal clearance upright position: Esophageal retention with retrograde flow through the PES; Minimal to no esophageal clearance Pill:  Pill Consistency administered: -- (not administered) Penetration/Aspiration Scale Score: Penetration/Aspiration Scale Score 1.  Material does  not enter airway: Thin liquids (Level 0); Solid 2.  Material enters airway, remains ABOVE vocal cords then ejected out: Moderately thick liquids (Level 3, honey thick) 5.  Material enters airway, CONTACTS cords and not ejected out: Mildly thick liquids (Level 2, nectar thick) 6.  Material enters airway, passes BELOW cords then ejected out: Mildly thick liquids (Level 2, nectar thick) 8.  Material enters airway, passes BELOW cords without attempt by patient to eject out (silent aspiration) : Moderately thick liquids (Level 3, honey thick); Puree; Thin liquids (Level 0) Compensatory Strategies: Compensatory Strategies Compensatory strategies: Yes Straw: Ineffective Ineffective Straw: Thin liquid (Level 0) Multiple swallows: Ineffective Chin tuck: Ineffective Ineffective Chin Tuck: Thin liquid (Level 0)   General Information: No data recorded Diet Prior to this Study: Regular; Thin liquids (Level 0)   No data recorded  No data recorded  No data recorded  No data recorded Behavior/Cognition: Alert; Cooperative No data recorded No data recorded No data recorded Volitional Swallow: Able to elicit Exam Limitations: No limitations Goal Planning: Prognosis for improved oropharyngeal function: Fair No data recorded No data recorded No data recorded Consulted and agree with results and recommendations: Patient; Nurse; Physician Pain: Pain Assessment Pain Assessment: No/denies pain End of Session: Start Time:SLP Start Time (ACUTE ONLY): 0805 Stop Time: SLP Stop Time (ACUTE ONLY): 0835 Time Calculation:SLP Time Calculation (min) (ACUTE ONLY): 30 min Charges: SLP Evaluations $ SLP Speech Visit: 1 Visit SLP Evaluations $BSS Swallow: 1 Procedure $MBS Swallow: 1 Procedure SLP visit diagnosis: SLP Visit Diagnosis: Dysphagia, pharyngoesophageal phase (R13.14) Past Medical History: Past Medical  History: Diagnosis Date  Abnormality of gait 03/03/2013  bladder ca dx'd 11/2009  chemo/xrt comp 02/2010  Cerebrovascular disease   right brain CVA 2007  COPD (chronic obstructive pulmonary disease) (HCC)   Cough productive of clear sputum   TX RECENT SINUS INFECTION   CVA (cerebral vascular accident) (HCC) 2007  r-cva  Dyslipidemia   Hypertension   Lumbago  Past Surgical History: Past Surgical History: Procedure Laterality Date  ABDOMINAL AORTAGRAM N/Camp Gopal 12/07/2013  Procedure: ABDOMINAL EZELLA;  Surgeon: Lonni GORMAN Blade, MD;  Location: [facility] CATH LAB;  Service: Cardiovascular;  Laterality: N/Amiree No;  ABDOMINAL AORTOGRAM W/LOWER EXTREMITY N/Terrika Zuver 06/26/2023  Procedure: ABDOMINAL AORTOGRAM W/LOWER EXTREMITY;  Surgeon: Lanis Fonda BRAVO, MD;  Location: [facility] INVASIVE CV LAB;  Service: Cardiovascular;  Laterality: N/Hisao Doo;  BACK SURGERY  2010  bladder cancer    CAROTID ARTERY ANGIOPLASTY  2009  CHOLECYSTECTOMY N/Kashay Cavenaugh 06/06/2016  Procedure: LAPAROSCOPIC CHOLECYSTECTOMY WITH INTRAOPERATIVE CHOLANGIOGRAM;  Surgeon: Krystal Spinner, MD;  Location: [facility] ORS;  Service: General;  Laterality: N/Jossalin Chervenak;  ERCP N/Devian Bartolomei 06/05/2016  Procedure: ENDOSCOPIC RETROGRADE CHOLANGIOPANCREATOGRAPHY (ERCP);  Surgeon: Norleen Hint, MD;  Location: [facility];  Service: Endoscopy;  Laterality: N/Braun Rocca;  ESOPHAGOGASTRODUODENOSCOPY (EGD) WITH PROPOFOL  N/Dandre Sisler 06/05/2016  Procedure: ESOPHAGOGASTRODUODENOSCOPY (EGD);  Surgeon: Norleen Hint, MD;  Location: [facility];  Service: Endoscopy;  Laterality: N/Shyrl Obi;  FEMORAL-FEMORAL BYPASS GRAFT N/Arcenio Mullaly 12/09/2013  Procedure: BYPASS GRAFT FEMORAL-FEMORAL ARTERY WITH BILATERAL ENDARTERECTOMY ;  Surgeon: Carlin BRAVO Haddock, MD;  Location[facility] OR;  Service: Vascular;  Laterality: N/Chrishawn Boley;  FEMORAL-POPLITEAL BYPASS GRAFT Left 12/09/2013  Procedure: BYPASS GRAFT FEMORAL-POPLITEAL ARTERY - LEFT ;  Surgeon: Carlin BRAVO Haddock, MD;  Location: [facility] OR;  Service: Vascular;  Laterality: Left;  LAMINECTOMY  09/08/2013  L 2 L3 L4 L5       Dr Amon  LOWER EXTREMITY ANGIOGRAPHY Right  07/03/2023  Procedure: Lower Extremity Angiography;  Surgeon: Lanis Fonda BRAVO, MD;  Location: [facility] INVASIVE CV  LAB;  Service: Cardiovascular;  Laterality: Right;  PERIPHERAL VASCULAR BALLOON ANGIOPLASTY Right 07/03/2023  Procedure: PERIPHERAL VASCULAR BALLOON ANGIOPLASTY;  Surgeon: Lanis Fonda BRAVO, MD;  Location: [facility] INVASIVE CV LAB;  Service: Cardiovascular;  Laterality: Right;  R external iliac [name] 05/21/2024, 10:24 AM  DG Chest Port 1 View Result Date: 05/20/2024 CLINICAL DATA:  Shortness of breath EXAM: PORTABLE CHEST 1 VIEW COMPARISON:  05/20/2024 FINDINGS: Elevation of left hemidiaphragm. Heart size and pulmonary vascularity are normal. Emphysematous changes and fibrosis in the lungs. Suggestion of superimposed airspace disease on the left, possibly superimposed pneumonia right asymmetrical edema. No pleural effusion or pneumothorax. Similar appearance to previous study. Calcification of the aorta. Postoperative changes in the lumbar spine. IMPRESSION: No change since previous study. 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 Electronically Signed   By: Elsie Gravely M.D.   On: 05/20/2024 23:24   DG CHEST PORT 1 VIEW Result Date: 05/20/2024 CLINICAL DATA:  Shortness of breath. EXAM: PORTABLE CHEST 1 VIEW COMPARISON:  Chest radiograph dated 05/18/2024. FINDINGS: Background of emphysema and chronic Scholl coarsening. Bilateral reticulonodular airspace densities, left greater than right, and significantly increased since the prior radiograph consistent with pneumonia. Aspiration is not excluded. No pleural effusion pneumothorax. The cardiac silhouette is within limits. Atherosclerotic calcification of the aorta. No acute osseous pathology. IMPRESSION: Progression of pulmonary infiltrates. Electronically Signed   By: Vanetta Chou M.D.   On: 05/20/2024 18:34        Scheduled Meds:  atorvastatin   20 mg Oral q AM   doxycycline    100 mg Oral Q12H   feeding supplement  237 mL Oral BID BM   fluticasone  furoate-vilanterol  1 puff Inhalation Daily   furosemide   40 mg Intravenous Daily   ipratropium-albuterol   3 mL Nebulization BID   irbesartan   150 mg Oral Daily   metoprolol  tartrate  12.5 mg Oral BID   pantoprazole   40 mg Oral Q0600   sertraline   75 mg Oral Daily   spironolactone   25 mg Oral Daily   traZODone   50 mg Oral QHS   Continuous Infusions:  cefTRIAXone  (ROCEPHIN )  IV 1 g (05/22/24 0742)     LOS: 3 days    Time spent: over 30 min    Meliton Monte, MD [facility]   To contact the attending provider between 7A-7P or the covering provider during after hours 7P-7A, please log into the web site [email or web address] and access using universal [place] password for that web site. If you do not have the password, please call the hospital operator.  05/22/2024, 8:09 AM

## 2024-05-22 NOTE — Plan of Care (Signed)

## 2024-05-22 NOTE — Progress Notes (Signed)
 Physical Therapy Treatment Patient Details Name: Thomas Ferrell MRN: [id number] DOB: 20-Jun-1941 Today's Date: 05/22/2024   History of Present Illness Pt is an 83 y/o male who presents to [facility] 05/18/24 post ground level fall.  PMH significant for PAD, carotid artery stenosis, HTN, prior history of tobacco abuse, stroke/TIA, COPD, anemia.  Recent hospitalization 5/25 for SOB and COPD.    PT Comments  The pt is making gradual functional progress as he was able to perform bed mobility with supervision, transfers with CGA-minA, and begin ambulating up to ~5-6 ft at a time with minA and RW support. He is at high risk for falls due to noted deficits in strength, endurance, and balance. He staggered laterally multiple times while ambulating, needing minA to recover. He needs repeated reminders for proper hand placement with transfers along with continual cuing while ambulating to avoid getting tangled in his [place] cord. Finished session with serial sit <> stand reps to improve his lower extremity strength. Will continue to follow acutely.    If plan is discharge home, recommend the following: A lot of help with bathing/dressing/bathroom;Assistance with cooking/housework;Assist for transportation;Help with stairs or ramp for entrance;A little help with walking and/or transfers   Can travel by private vehicle     Yes  Equipment Recommendations  BSC/3in1;Wheelchair (measurements PT);Wheelchair cushion (measurements PT)    Recommendations for Other Services       Precautions / Restrictions Precautions Precautions: Fall Precaution/Restrictions Comments: Very anxious and restless; watch SpO2 and HR Restrictions Weight Bearing Restrictions Per Provider Order: No     Mobility  Bed Mobility Overal bed mobility: Needs Assistance Bed Mobility: Supine to Sit, Sit to Supine     Supine to sit: Supervision, HOB elevated, Used rails Sit to supine: Supervision, HOB elevated, Used rails   General bed mobility  comments: Extra time and pt utilizing bed rails for bed mobility, HOB elevated. Supervision for safety. Pt needed maxA to scoot superiorly in bed though    Transfers Overall transfer level: Needs assistance Equipment used: Rolling walker (2 wheels) Transfers: Sit to/from Stand Sit to Stand: Contact guard assist, Min assist           General transfer comment: Pt demonstrated a tendency to pull up on the RW, needing repeated cues to push up with one hand on the bed to push up to stand and reach back to the bed prior to sitting. Pt varied in needing CGA-minA for functional transfers. x5 serial reps, x8 total from EOB    Ambulation/Gait Ambulation/Gait assistance: Min assist, Contact guard assist Gait Distance (Feet): 6 Feet (x3 bouts of ~5-6 ft each bout) Assistive device: Rolling walker (2 wheels) Gait Pattern/deviations: Step-to pattern, Decreased step length - right, Decreased step length - left, Decreased stance time - left, Decreased stride length, Trunk flexed, Staggering left, Staggering right Gait velocity: reduced Gait velocity interpretation: <1.31 ft/sec, indicative of household ambulator   General Gait Details: Pt takes slow, unsteady steps around the EOB, intermittently staggering laterally and needing minA to maintain his balance the majority of the time. Intermittent progression to CGA. Needs cues to turn and avoid getting tangled in [place] cord.   Stairs             Wheelchair Mobility     Tilt Bed    Modified Rankin (Stroke Patients Only)       Balance Overall balance assessment: Needs assistance Sitting-balance support: No upper extremity supported, Feet supported Sitting balance-Leahy Scale: Fair Sitting balance - Comments: static  sitting EOB with supervision for safety   Standing balance support: Bilateral upper extremity supported, During functional activity, Reliant on assistive device for balance Standing balance-Leahy Scale: Poor Standing balance  comment: reliant on RW and up to [place]                            Communication Communication Communication: No apparent difficulties  Cognition Arousal: Alert Behavior During Therapy: WFL for tasks assessed/performed, Restless   PT - Cognitive impairments: No family/caregiver present to determine baseline                       PT - Cognition Comments: Pt mildly restless and question some attention deficits, but unsure if pt just did not hear therapist when provided cues Following commands: Impaired Following commands impaired: Follows multi-step commands inconsistently, Follows multi-step commands with increased time    Cueing Cueing Techniques: Verbal cues, Tactile cues  Exercises Other Exercises Other Exercises: x5 serial sit <> stand reps with CGA-minA    General Comments General comments (skin integrity, edema, etc.): HR up to 148 bpm briefly then recovered to 120s with resting      Pertinent Vitals/Pain Pain Assessment Pain Assessment: Faces Faces Pain Scale: Hurts little more Pain Location: Chronic back and L leg pain Pain Descriptors / Indicators: Discomfort, Grimacing, Guarding, Sore, Other (Comment) (neuropathy pain) Pain Intervention(s): Limited activity within patient's tolerance, Monitored during session, Repositioned    Home Living                          Prior Function            PT Goals (current goals can now be found in the care plan section) Acute Rehab PT Goals Patient Stated Goal: feel better PT Goal Formulation: With patient Time For Goal Achievement: 06/02/24 Potential to Achieve Goals: Fair Progress towards PT goals: Progressing toward goals    Frequency    Min 2X/week      PT Plan      Co-evaluation              AM-PAC PT 6 Clicks Mobility   Outcome Measure  Help needed turning from your back to your side while in a flat bed without using bedrails?: A Little Help needed moving from lying on  your back to sitting on the side of a flat bed without using bedrails?: A Little Help needed moving to and from a bed to a chair (including a wheelchair)?: A Little Help needed standing up from a chair using your arms (e.g., wheelchair or bedside chair)?: A Little Help needed to walk in hospital room?: Total (<20 ft) Help needed climbing 3-5 steps with a railing? : Total 6 Click Score: 14    End of Session Equipment Utilized During Treatment: Gait belt;Oxygen  Activity Tolerance: Treatment limited secondary to medical complications (Comment);Patient limited by fatigue (limited by elevated HR) Patient left: in bed;with call bell/phone within reach;with bed alarm set Nurse Communication: Mobility status;Other (comment) (HR) PT Visit Diagnosis: Unsteadiness on feet (R26.81);Difficulty in walking, not elsewhere classified (R26.2);Muscle weakness (generalized) (M62.81);Other abnormalities of gait and mobility (R26.89)     Time: 8642-8583 PT Time Calculation (min) (ACUTE ONLY): 19 min  Charges:    $Therapeutic Activity: 8-22 mins PT General Charges $$ ACUTE PT VISIT: 1 Visit  Theo Ferretti, PT, DPT Acute Rehabilitation Services  Office: [phone]    Theo CHRISTELLA Ferretti 05/22/2024, 2:32 PM

## 2024-05-22 NOTE — Plan of Care (Signed)
  Problem: Clinical Measurements: Goal: Ability to maintain clinical measurements within normal limits will improve Outcome: Progressing Goal: Cardiovascular complication will be avoided Outcome: Progressing   Problem: Safety: Goal: Ability to remain free from injury will improve Outcome: Progressing   Problem: Skin Integrity: Goal: Risk for impaired skin integrity will decrease Outcome: Progressing   

## 2024-05-22 NOTE — Anesthesia Preprocedure Evaluation (Addendum)
 Anesthesia Evaluation  Patient identified by MRN, date of birth, ID band Patient awake    Reviewed: Allergy & Precautions, NPO status , Patient's Chart, lab work & pertinent test results, reviewed documented beta blocker date and time   History of Anesthesia Complications Negative for: history of anesthetic complications  Airway Mallampati: II  TM Distance: >3 FB Neck ROM: Full    Dental  (+) Edentulous Lower, Edentulous Upper   Pulmonary COPD, former smoker   Pulmonary exam normal        Cardiovascular hypertension, Pt. on home beta blockers and Pt. on medications + CAD and + Peripheral Vascular Disease  Normal cardiovascular exam+ dysrhythmias (on Xarelto) Atrial Fibrillation   TTE 05/21/2024: 1. Left ventricular ejection fraction, by estimation, is 60 to 65%. The left ventricle has normal function. Left ventricular diastolic parameters are consistent with Grade II diastolic dysfunction (pseudonormalization).   2. Left atrial size was mildly dilated.   3. The mitral valve is normal in structure. Mild mitral valve regurgitation.   4. There is moderate calcification of the aortic valve. Mild aortic valve stenosis. Aortic valve area, by VTI measures 1.45 cm. Aortic valve mean gradient measures 10.0 mmHg. Aortic valve Vmax measures 2.12 m/s.   5. The inferior vena cava is normal in size with greater than 50% respiratory variability, suggesting right atrial pressure of 3 mmHg.     Neuro/Psych CVA (2007)  negative psych ROS   GI/Hepatic Neg liver ROS,GERD  Medicated,,dysphagia   Endo/Other  negative endocrine ROS    Renal/GU negative Renal ROS  negative genitourinary   Musculoskeletal negative musculoskeletal ROS (+)    Abdominal   Peds  Hematology  (+) Blood dyscrasia (Hgb 9.6), anemia   Anesthesia Other Findings Day of surgery medications reviewed with patient.  Reproductive/Obstetrics                               Anesthesia Physical Anesthesia Plan  ASA: 3  Anesthesia Plan: MAC   Post-op Pain Management: Minimal or no pain anticipated   Induction:   PONV Risk Score and Plan: 1 and Treatment may vary due to age or medical condition and Propofol  infusion  Airway Management Planned: Natural Airway and Nasal Cannula  Additional Equipment: None  Intra-op Plan:   Post-operative Plan:   Informed Consent: I have reviewed the patients History and Physical, chart, labs and discussed the procedure including the risks, benefits and alternatives for the proposed anesthesia with the patient or authorized representative who has indicated his/her understanding and acceptance.   Patient has DNR.  Discussed DNR with patient and Suspend DNR.     Plan Discussed with: CRNA  Anesthesia Plan Comments:          Anesthesia Quick Evaluation

## 2024-05-23 ENCOUNTER — Inpatient Hospital Stay (HOSPITAL_COMMUNITY)

## 2024-05-23 DIAGNOSIS — J984 Other disorders of lung: Secondary | ICD-10-CM | POA: Diagnosis not present

## 2024-05-23 DIAGNOSIS — Z9181 History of falling: Secondary | ICD-10-CM | POA: Diagnosis not present

## 2024-05-23 DIAGNOSIS — I4891 Unspecified atrial fibrillation: Secondary | ICD-10-CM | POA: Diagnosis not present

## 2024-05-23 DIAGNOSIS — J949 Pleural condition, unspecified: Secondary | ICD-10-CM | POA: Diagnosis not present

## 2024-05-23 DIAGNOSIS — J9601 Acute respiratory failure with hypoxia: Secondary | ICD-10-CM | POA: Diagnosis not present

## 2024-05-23 LAB — CBC WITH DIFFERENTIAL/PLATELET
Abs Immature Granulocytes: 0.09 K/uL — ABNORMAL HIGH (ref 0.00–0.07)
Basophils Absolute: 0 K/uL (ref 0.0–0.1)
Basophils Relative: 0 %
Eosinophils Absolute: 0.3 K/uL (ref 0.0–0.5)
Eosinophils Relative: 3 %
HCT: 29.3 % — ABNORMAL LOW (ref 39.0–52.0)
Hemoglobin: 9.3 g/dL — ABNORMAL LOW (ref 13.0–17.0)
Immature Granulocytes: 1 %
Lymphocytes Relative: 6 %
Lymphs Abs: 0.6 K/uL — ABNORMAL LOW (ref 0.7–4.0)
MCH: 31.8 pg (ref 26.0–34.0)
MCHC: 31.7 g/dL (ref 30.0–36.0)
MCV: 100.3 fL — ABNORMAL HIGH (ref 80.0–100.0)
Monocytes Absolute: 0.7 K/uL (ref 0.1–1.0)
Monocytes Relative: 7 %
Neutro Abs: 7.6 K/uL (ref 1.7–7.7)
Neutrophils Relative %: 83 %
Platelets: 260 K/uL (ref 150–400)
RBC: 2.92 MIL/uL — ABNORMAL LOW (ref 4.22–5.81)
RDW: 16.6 % — ABNORMAL HIGH (ref 11.5–15.5)
WBC: 9.2 K/uL (ref 4.0–10.5)
nRBC: 0 % (ref 0.0–0.2)

## 2024-05-23 LAB — COMPREHENSIVE METABOLIC PANEL WITH GFR
ALT: 53 U/L — ABNORMAL HIGH (ref 0–44)
AST: 54 U/L — ABNORMAL HIGH (ref 15–41)
Albumin: 2.5 g/dL — ABNORMAL LOW (ref 3.5–5.0)
Alkaline Phosphatase: 69 U/L (ref 38–126)
Anion gap: 14 (ref 5–15)
BUN: 18 mg/dL (ref 8–23)
CO2: 24 mmol/L (ref 22–32)
Calcium: 8.2 mg/dL — ABNORMAL LOW (ref 8.9–10.3)
Chloride: 99 mmol/L (ref 98–111)
Creatinine, Ser: 0.69 mg/dL (ref 0.61–1.24)
GFR, Estimated: >60 mL/min (ref >60)
Glucose, Bld: 99 mg/dL (ref 70–99)
Potassium: 3 mmol/L — ABNORMAL LOW (ref 3.5–5.1)
Sodium: 137 mmol/L (ref 135–145)
Total Bilirubin: 1 mg/dL (ref 0.0–1.2)
Total Protein: 5.6 g/dL — ABNORMAL LOW (ref 6.5–8.1)

## 2024-05-23 LAB — AMMONIA: Ammonia: 22 umol/L (ref 9–35)

## 2024-05-23 LAB — CULTURE, BLOOD (ROUTINE X 2)
Culture: NO GROWTH
Culture: NO GROWTH
Special Requests: ADEQUATE
Special Requests: ADEQUATE

## 2024-05-23 LAB — PHOSPHORUS: Phosphorus: 3.6 mg/dL (ref 2.5–4.6)

## 2024-05-23 LAB — HIV ANTIBODY (ROUTINE TESTING W REFLEX): HIV Screen 4th Generation wRfx: NONREACTIVE

## 2024-05-23 LAB — BRAIN NATRIURETIC PEPTIDE: B Natriuretic Peptide: 264.2 pg/mL — ABNORMAL HIGH (ref 0.0–100.0)

## 2024-05-23 LAB — TSH: TSH: 1.239 u[IU]/mL (ref 0.350–4.500)

## 2024-05-23 LAB — MAGNESIUM: Magnesium: 1.8 mg/dL (ref 1.7–2.4)

## 2024-05-23 LAB — RPR: RPR Ser Ql: NONREACTIVE

## 2024-05-23 LAB — PROCALCITONIN: Procalcitonin: 0.15 ng/mL

## 2024-05-23 MED ORDER — POTASSIUM CHLORIDE 20 MEQ PO PACK
40.0000 meq | PACK | ORAL | Status: AC
  Administered 2024-05-23 (×2): 40 meq via ORAL
  Filled 2024-05-23 (×2): qty 2

## 2024-05-23 MED ORDER — MAGNESIUM SULFATE IN D5W 1-5 GM/100ML-% IV SOLN
1.0000 g | Freq: Once | INTRAVENOUS | Status: AC
  Administered 2024-05-23: 1 g via INTRAVENOUS
  Filled 2024-05-23: qty 100

## 2024-05-23 MED ORDER — METOPROLOL TARTRATE 5 MG/5ML IV SOLN: 5.0000 mg | INTRAVENOUS | Status: DC | PRN

## 2024-05-23 MED ORDER — METOPROLOL TARTRATE 12.5 MG HALF TABLET
12.5000 mg | ORAL_TABLET | Freq: Two times a day (BID) | ORAL | Status: DC
  Administered 2024-05-23 – 2024-05-26 (×7): 12.5 mg via ORAL
  Filled 2024-05-23 (×7): qty 1

## 2024-05-23 NOTE — Progress Notes (Signed)
 PROGRESS NOTE    Thomas Ferrell  FMW:[id number] DOB: 1940/12/30 DOA: 05/18/2024 PCP: [name], Ferrell  Chief Complaint  Patient presents with   Fall    Brief Narrative:   Thomas Ferrell is an 83 y.o. male past medical history significant for peripheral arterial disease, carotid stenosis status post bilateral common femoral endarterectomy and bypass, essential hypertension, history of TIA, paroxysmal atrial fibrillation on Eliquis  COPD on 2 L of oxygen  at home per patient's report failure to thrive he called EMS as he could not get get up from the floor he did not hit his head.  White blood cell count of 23,000 chest x-ray showed left lower lobe consolidation   Assessment & Plan:   Principal Problem:   Fall Active Problems:   CAP (community acquired pneumonia)   FTT (failure to thrive) in adult   PNA (pneumonia)   Pressure injury of skin   Malnutrition of moderate degree   Acute hypoxemic respiratory failure (HCC)  Goals of Care Spoke to Mrs. Poage Thomas Ferrell few times over the phone 7/11, her sons (mainly [place]) as well.  Tough situation with his aspiration and egd with abnormal esophageal motility.  Based on his SLP eval, they'd recommended NPO status (had significant esophageal retention of liquids and solids.  Silent aspiration of thins, nectar thick, and honey thick residue vs puree.  We'd talked about him remaining NPO until 7/12, but he requested to try some liquids.  We'd discussed risks of comfort feeds earlier and his family reviewed this with him.  They're agreeable to trial of clear liquids.  SLP to see again today.  Palliative has been c/s as well for additional goc conversations.  Acute Hypoxic Respiratory Failure secondary to Pneumonia Reportedly on 2 L chronically - need to clarify this with his wife CXR at presentation with superimposed LLL airspace disease c/w pneumonia or aspiration pneumonitis CXR 7/9 with progressive superimposed airspace disease in the L lung,  chronic interstitial disease Ceftriaxone , doxycycline  - he's completed 5 day course.  As he's afebrile with normal wbc count, will hold off on Terisha Losasso prolonged abx course.  Urine strep, legionella, MRSA PCR, sputum cx Blood cx NGTD SLP eval with concern for aspiration - suspect this is cause of his presentation  Elevated BNP  Concern for Volume Overload Clinically, doesn't appear overloaded, but BNP elevated from prior and worsening hypoxia with abnormal CXR Repeat echo with grade II diastolic dysfunction, EF 60-65%, mild AV stenosis, IVC normal with greater than 50% resp variability Will diurese as tolerated - hold today  Dysphagia  Concern for Aspiration MBS 7/10, concern for poor esophageal clearance of liquids and solids GI has been consulted -> plan for EGD today -> with abnormal esophageal motility, concern for presbyesophagus - not candidate for reglan/erythromycin with his QT prolongation.   Allowing clears, follow SLP eval  Acute Metabolic Encephalopathy Concern for Cognitive Deficit Per EDP note, hallucinating prior to admission Still confused, will work on better picture of baseline mental status - some memory issues the last few months.  Issues with short term memory recently, getting progressively worse.  He's been combative here, impulsive.   Neurology follow up outpatient - trial trazodone  for sleep Delirium precautions Ammonia wnl, TSH wnl, RPR pending.  B12 elevated.  VBG without hypercarbia.   Paroxysmal atrial fibrillation on Eliquis   RVR: RVR 7/11-12 overnight, started back on metop (had been held due to bradycardia noted earlier) Eliquis  on hold with his large hematoma below  Avoid NSAIDs  Hypokalemia Replace and follow   Iron Deficiency Anemia Check Glorianna Gott B12 (elevated) and folate (wnl) Iron is low trend   Physical deconditioning/ Fall: PT OT consulted.  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   Right Forearm Hematoma 9.4 x 4.4 x 2.7 cm hematoma in subcutaneous  tissues of proximal dorsal forearm - seems to be improving per discussion with his wife - relatively stable today again - per wife, has been this size for Dessa Ledee while now Monitor off anticoagulation   Essential hypertension: Irbesartan , metoprolol , spironolactone    Moderate Malnutrition  Noted, RD c/s Body mass index is 18.88 kg/m.  Dyslipidemia Hold statin for now  Sacral decubitus ulcer present on admission unstageable    DVT prophylaxis: SCD Code Status: DNR  Family Communication: wife over phone multiple times 7/11, will call 7/12 Disposition:   Status is: Inpatient Remains inpatient appropriate because: need for ongoing inpatient care   Consultants:  none  Procedures:  none  Antimicrobials:  Anti-infectives (From admission, onward)    Start     Dose/Rate Route Frequency Ordered Stop   05/19/24 1015  doxycycline  (VIBRA -TABS) tablet 100 mg        100 mg Oral Every 12 hours 05/19/24 0916 05/22/24 2109   05/19/24 0800  cefTRIAXone  (ROCEPHIN ) 1 g in sodium chloride  0.9 % 100 mL IVPB        1 g 200 mL/hr over 30 Minutes Intravenous Every 24 hours 05/18/24 1349 05/22/24 0812   05/18/24 1445  azithromycin  (ZITHROMAX ) tablet 500 mg  Status:  Discontinued        500 mg Oral Daily 05/18/24 1349 05/19/24 0916   05/18/24 0615  vancomycin  (VANCOCIN ) IVPB 1000 mg/200 mL premix        1,000 mg 200 mL/hr over 60 Minutes Intravenous  Once 05/18/24 0608 05/18/24 0733   05/18/24 0615  ceFEPIme  (MAXIPIME ) 2 g in sodium chloride  0.9 % 100 mL IVPB        2 g 200 mL/hr over 30 Minutes Intravenous  Once 05/18/24 9391 05/18/24 0656       Subjective: Denies new complaints About to get on toilet to have BM Denies SOB When I talk about the plan today he says I don't know what you're talking about - will plan to discuss with his wife  Objective: Vitals:   05/23/24 0100 05/23/24 0234 05/23/24 0636 05/23/24 0721  BP:  (!) 147/72 (!) 140/82   Pulse: 61 (!) 110 98   Resp:  20     Temp:  (!) 97.4 F (36.3 C)    TempSrc:  Oral  Axillary  SpO2: 100% 100%    Weight: 49.9 kg     Height:        Intake/Output Summary (Last 24 hours) at 05/23/2024 0754 Last data filed at 05/23/2024 0101 Gross per 24 hour  Intake 353.6 ml  Output 500 ml  Net -146.4 ml   Filed Weights   05/22/24 0500 05/22/24 1520 05/23/24 0100  Weight: 47.7 kg 47.7 kg 49.9 kg    Examination:  General: No acute distress. Chronically ill appearing.  Cardiovascular: irregularly irregular, tachy in 110's Lungs: crackles greater on L on anterior exam  Neurological: Alert. Moves all extremities 4 . Cranial nerves II through XII grossly intact. Skin: Warm and dry. No rashes or lesions. Extremities: No clubbing or cyanosis. No edema. Unchanged R arm hematoma.   Data Reviewed: I have personally reviewed following labs and imaging studies  CBC: Recent Labs  Lab 05/18/24 0435  05/18/24 9257 05/20/24 0819 05/21/24 0028 05/22/24 0251 05/23/24 0320  WBC 22.9* 13.7* 11.6* 15.7* 11.6* 9.2  NEUTROABS 19.3*  --  10.8* 13.8* 10.1* 7.6  HGB 9.3* 8.4* 8.8* 9.7* 9.6* 9.3*  HCT 29.2* 27.2* 27.1* 29.8* 29.9* 29.3*  MCV 104.3* 104.2* 99.3 99.7 99.0 100.3*  PLT 357 220 244 300 292 260    Basic Metabolic Panel: Recent Labs  Lab 05/19/24 0640 05/20/24 0819 05/21/24 0028 05/22/24 0251 05/23/24 0320  NA 139 138 138 141 137  K 3.5 3.3* 4.2 3.6 3.0*  CL 111 105 102 99 99  CO2 18* 22 22 30 24   GLUCOSE 113* 166* 130* 111* 99  BUN 9 11 13 15 18   CREATININE 0.75 0.73 0.74 0.77 0.69  CALCIUM  7.9* 8.3* 8.5* 8.5* 8.2*  MG  --   --  1.1* 2.1 1.8  PHOS  --   --  2.5 3.4 3.6    GFR: Estimated Creatinine Clearance: 51.1 mL/min (by C-G formula based on SCr of 0.69 mg/dL).  Liver Function Tests: Recent Labs  Lab 05/18/24 0208 05/20/24 0819 05/21/24 0028 05/22/24 0251 05/23/24 0320  AST 35 43* 53* 49* 54*  ALT 25 33 41 47* 53*  ALKPHOS 72 63 65 74 69  BILITOT 1.2 1.0 0.9 1.1 1.0  PROT 5.9* 5.3*  6.1* 5.9* 5.6*  ALBUMIN 3.0* 2.5* 2.9* 2.7* 2.5*    CBG: No results for input(s): GLUCAP in the last 168 hours.   Recent Results (from the past 240 hours)  Blood Culture (routine x 2)     Status: None (Preliminary result)   Collection Time: 05/18/24  1:21 AM   Specimen: BLOOD  Result Value Ref Range Status   Specimen Description BLOOD LEFT ARM  Final   Special Requests   Final    BOTTLES DRAWN AEROBIC AND ANAEROBIC Blood Culture adequate volume   Culture   Final    NO GROWTH 4 DAYS Performed at [facility], [address], [place], [place] [address]    Report Status PENDING  Incomplete  Blood Culture (routine x 2)     Status: None (Preliminary result)   Collection Time: 05/18/24  1:26 AM   Specimen: BLOOD RIGHT HAND  Result Value Ref Range Status   Specimen Description BLOOD RIGHT HAND  Final   Special Requests   Final    BOTTLES DRAWN AEROBIC AND ANAEROBIC Blood Culture adequate volume   Culture   Final    NO GROWTH 4 DAYS Performed at [facility], [address], [place], [place] [address]    Report Status PENDING  Incomplete         Radiology Studies: DG Chest Port 1 View Result Date: 05/23/2024 EXAM: 1 VIEW XRAY OF THE CHEST 05/23/2024 07:09:19 AM COMPARISON: 1 view chest x-ray 03/20/2024 and 1-view chest x-ray 05/09/2024. CLINICAL HISTORY: Atrial fibrillation with RVR (HCC) [id number]. Chance Munter-fib with RVR; hx of fall. FINDINGS: LUNGS AND PLEURA: Progressive superimposed airspace disease is present in the left lung. Chronic interstitial disease is again noted. No pleural effusion. No pneumothorax. HEART AND MEDIASTINUM: Atherosclerotic changes are again noted at the aortic arch. No acute abnormality of the cardiac and mediastinal silhouettes. BONES AND SOFT TISSUES: Degenerative changes are present. No acute osseous abnormality. IMPRESSION: 1. Progressive superimposed airspace disease in the left lung. 2. Chronic interstitial disease. Electronically signed by:  Thomas Ferrell 05/23/2024 07:22 AM EDT RP Workstation: [id number]   ECHOCARDIOGRAM LIMITED Result Date: 05/21/2024    ECHOCARDIOGRAM  LIMITED REPORT   Patient Name:   Thomas Ferrell Date of Exam: 05/21/2024 Medical Rec #:  [id number]      Height:       64.0 in Accession #:    [id number]     Weight:       105.6 lb Date of Birth:  [birth year]     BSA:          1.491 m Patient Age:    82 years       BP:           184/69 mmHg Patient Gender: M              HR:           91 bpm. Exam Location:  Inpatient Procedure: Limited Color Doppler and Cardiac Doppler (Both Spectral and Color            Flow Doppler were utilized during procedure). Indications:    Other abnormalities of the heart  History:        Patient has prior history of Echocardiogram examinations, most                 recent 03/18/2024. Pneumonia.  Sonographer:    Benard Stallion Referring Phys: [phone] Takirah Binford CALDWELL POWELL JR IMPRESSIONS  1. Left ventricular ejection fraction, by estimation, is 60 to 65%. The left ventricle has normal function. Left ventricular diastolic parameters are consistent with Grade II diastolic dysfunction (pseudonormalization).  2. Left atrial size was mildly dilated.  3. The mitral valve is normal in structure. Mild mitral valve regurgitation.  4. There is moderate calcification of the aortic valve. Mild aortic valve stenosis. Aortic valve area, by VTI measures 1.45 cm. Aortic valve mean gradient measures 10.0 mmHg. Aortic valve Vmax measures 2.12 m/s.  5. The inferior vena cava is normal in size with greater than 50% respiratory variability, suggesting right atrial pressure of 3 mmHg. FINDINGS  Left Ventricle: Left ventricular ejection fraction, by estimation, is 60 to 65%. The left ventricle has normal function. Left ventricular diastolic parameters are consistent with Grade II diastolic dysfunction (pseudonormalization). Left Atrium: Left atrial size was mildly dilated. Mitral Valve: The mitral valve is normal in structure.  Mild mitral valve regurgitation. Tricuspid Valve: The tricuspid valve is normal in structure. Tricuspid valve regurgitation is trivial. Aortic Valve: There is moderate calcification of the aortic valve. Mild aortic stenosis is present. Aortic valve mean gradient measures 10.0 mmHg. Aortic valve peak gradient measures 18.1 mmHg. Aortic valve area, by VTI measures 1.45 cm. Pulmonic Valve: The pulmonic valve was grossly normal. Pulmonic valve regurgitation is trivial. Venous: The inferior vena cava is normal in size with greater than 50% respiratory variability, suggesting right atrial pressure of 3 mmHg. LEFT VENTRICLE PLAX 2D LVIDd:         4.20 cm   Diastology LVIDs:         2.90 cm   LV e' medial:    7.40 cm/s LV PW:         1.00 cm   LV E/e' medial:  14.2 LV IVS:        1.00 cm   LV e' lateral:   10.10 cm/s LVOT diam:     2.00 cm   LV E/e' lateral: 10.4 LV SV:         62 LV SV Index:   42 LVOT Area:     3.14 cm  RIGHT VENTRICLE RV S prime:     12.90 cm/s TAPSE (  M-mode): 2.6 cm LEFT ATRIUM             Index        RIGHT ATRIUM           Index LA diam:        3.50 cm 2.35 cm/m   RA Area:     18.00 cm LA Vol (A2C):   47.2 ml 31.65 ml/m  RA Volume:   50.50 ml  33.86 ml/m LA Vol (A4C):   35.7 ml 23.94 ml/m LA Biplane Vol: 43.8 ml 29.37 ml/m  AORTIC VALVE AV Area (Vmax):    1.40 cm AV Area (Vmean):   1.40 cm AV Area (VTI):     1.45 cm AV Vmax:           212.50 cm/s AV Vmean:          145.250 cm/s AV VTI:            0.427 m AV Peak Grad:      18.1 mmHg AV Mean Grad:      10.0 mmHg LVOT Vmax:         94.90 cm/s LVOT Vmean:        64.900 cm/s LVOT VTI:          0.198 m LVOT/AV VTI ratio: 0.46  AORTA Ao Root diam: 2.90 cm Ao Asc diam:  3.40 cm MITRAL VALVE                TRICUSPID VALVE MV Area (PHT): 3.93 cm     TR Peak grad:   47.1 mmHg MV Decel Time: 193 msec     TR Vmax:        343.00 cm/s MR Peak grad: 92.9 mmHg MR Vmax:      482.00 cm/s   SHUNTS MV E velocity: 105.00 cm/s  Systemic VTI:  0.20 m MV Angela Vazguez  velocity: 86.50 cm/s   Systemic Diam: 2.00 cm MV E/Lenna Hagarty ratio:  1.21 Thomas Ferrell Electronically signed by Thomas Ferrell Signature Date/Time: 05/21/2024/7:28:54 PM    Final    DG Swallowing Func-Speech Pathology Result Date: 05/21/2024 Table formatting from the original result was not included. Images from the original result were not included. Modified Barium Swallow Study Patient Details Name: Thomas Ferrell MRN: [id number] Date of Birth: [birth year] Today's Date: 05/21/2024 HPI/PMH: HPI: Pt is an 83 y/o male who presents to [facility] 05/18/24 post ground level fall. CXR shows Progressive superimposed left lower lobe airspace disease is consistent with  pneumonia or aspiration pneumonitis. Pt has been see in prior visits for chronic coughing. Has not had any instrumental testing.  PMH significant for PAD, carotid artery stenosis, HTN, prior history of tobacco abuse, stroke/TIA, COPD, anemia.  Recent hospitalization 5/25 for SOB and COPD. Clinical Impression: Pt presents with Nahdia Doucet moderate-severe pharyngeal dysphagia and concerns for Cherry Tur[place] primary esophageal dysphagia per results of MBSS completed today. There was aspiration of thin liquids, nectar-thick liquids, and honey-thick liquid residue vs puree vs mixture. Majority of aspiration events were silent with occasional delayed throat clearing observed following some aspiration events of thin liquids. Oral phase judged to be grossly WNL. Pharyngeal deficits included mistiming of swallow initiation vs incomplete closure of laryngeal vestibule contributing to penetration/aspiration events. There was reduced pharyngeal stripping resulting in min residue in vallecular space and posterior pharyngeal wall post swallow. Concern for esophageal dysphagia included significant and progressive retention of liquid and solid residue throughout the esophagus. There was x1 instance of backflow of thin  liquids through the UES that briefly penetrated the airway before being re-swallowed.  Image below of esophageal sweep at end of study. Poor esophageal clearance of liquids and solids. Recommend GI consult. SLP to follow up to trial 5cc Provale cup with pt to determine if pt can have small sips of thin liquids. Diet order pending based on tolerance. Updated RN and Ferrell on MBSS results and recommendations. Factors that may increase risk of adverse event in presence of aspiration Noe & Lianne 2021): Factors that may increase risk of adverse event in presence of aspiration Noe & Lianne 2021): Respiratory or GI disease; Frail or deconditioned; Limited mobility; Weak cough; Frequent aspiration of large volumes Recommendations/Plan: Swallowing Evaluation Recommendations Swallowing Evaluation Recommendations Recommendations: PO diet; Ice chips PRN after oral care PO Diet Recommendation: Thin liquids (Level 0); Clear liquid diet (via spooon or 5cc Provale cup) Medication Administration: Crushed with puree Supervision: Patient able to self-feed Swallowing strategies  : Slow rate; Small bites/sips (liquids by spoon or 5cc Provale cup only) Postural changes: Position pt fully upright for meals; Stay upright 30-60 min after meals Oral care recommendations: Oral care QID (4x/day) Recommended consults: Consider GI consultation Treatment Plan Treatment Plan Treatment recommendations: Therapy as outlined in treatment plan below Follow-up recommendations: -- (TBD) Functional status assessment: Patient has had Taras Rask recent decline in their functional status and demonstrates the ability to make significant improvements in function in Rasheena Talmadge reasonable and predictable amount of time. Treatment frequency: Min 2x/week Treatment duration: 4 weeks Interventions: Aspiration precaution training; Diet toleration management by SLP; Patient/family education Recommendations Recommendations for follow up therapy are one component of Shaylyn Bawa multi-disciplinary discharge planning process, led by the attending physician.  Recommendations may  be updated based on patient status, additional functional criteria and insurance authorization. Assessment: Orofacial Exam: Orofacial Exam Oral Cavity: Oral Hygiene: WFL Oral Cavity - Dentition: Dentures, top; Dentures, bottom Orofacial Anatomy: WFL Oral Motor/Sensory Function: WFL Anatomy: No data recorded Boluses Administered: Boluses Administered Boluses Administered: Thin liquids (Level 0); Mildly thick liquids (Level 2, nectar thick); Moderately thick liquids (Level 3, honey thick); Puree; Solid  Oral Impairment Domain: Oral Impairment Domain Lip Closure: No labial escape Tongue control during bolus hold: Posterior escape of less than half of bolus Bolus preparation/mastication: Timely and efficient chewing and mashing Bolus transport/lingual motion: Delayed initiation of tongue motion (oral holding) Oral residue: Trace residue lining oral structures Location of oral residue : Tongue Initiation of pharyngeal swallow : Valleculae  Pharyngeal Impairment Domain: Pharyngeal Impairment Domain Soft palate elevation: No bolus between soft palate (SP)/pharyngeal wall (PW) Laryngeal elevation: Complete superior movement of thyroid  cartilage with complete approximation of arytenoids to epiglottic petiole Anterior hyoid excursion: Complete anterior movement Epiglottic movement: Complete inversion Laryngeal vestibule closure: Incomplete, narrow column air/contrast in laryngeal vestibule Pharyngeal stripping wave : Present - diminished Pharyngeal contraction (Amarien Carne/P view only): N/Burnie Hank Pharyngoesophageal segment opening: Complete distension and complete duration, no obstruction of flow Tongue base retraction: Trace column of contrast or air between tongue base and PPW Pharyngeal residue: Trace residue within or on pharyngeal structures Location of pharyngeal residue: Valleculae; Tongue base  Esophageal Impairment Domain: Esophageal Impairment Domain Esophageal clearance upright position: Esophageal retention with retrograde flow  through the PES; Minimal to no esophageal clearance Pill: Pill Consistency administered: -- (not administered) Penetration/Aspiration Scale Score: Penetration/Aspiration Scale Score 1.  Material does not enter airway: Thin liquids (Level 0); Solid 2.  Material enters airway, remains ABOVE vocal cords then ejected out: Moderately thick liquids (Level 3, honey thick) 5.  Material enters airway, CONTACTS cords and not ejected out: Mildly thick liquids (Level 2, nectar thick) 6.  Material enters airway, passes BELOW cords then ejected out: Mildly thick liquids (Level 2, nectar thick) 8.  Material enters airway, passes BELOW cords without attempt by patient to eject out (silent aspiration) : Moderately thick liquids (Level 3, honey thick); Puree; Thin liquids (Level 0) Compensatory Strategies: Compensatory Strategies Compensatory strategies: Yes Straw: Ineffective Ineffective Straw: Thin liquid (Level 0) Multiple swallows: Ineffective Chin tuck: Ineffective Ineffective Chin Tuck: Thin liquid (Level 0)   General Information: No data recorded Diet Prior to this Study: Regular; Thin liquids (Level 0)   No data recorded  No data recorded  No data recorded  No data recorded Behavior/Cognition: Alert; Cooperative No data recorded No data recorded No data recorded Volitional Swallow: Able to elicit Exam Limitations: No limitations Goal Planning: Prognosis for improved oropharyngeal function: Fair No data recorded No data recorded No data recorded Consulted and agree with results and recommendations: Patient; Nurse; Physician Pain: Pain Assessment Pain Assessment: No/denies pain End of Session: Start Time:SLP Start Time (ACUTE ONLY): 0805 Stop Time: SLP Stop Time (ACUTE ONLY): 0835 Time Calculation:SLP Time Calculation (min) (ACUTE ONLY): 30 min Charges: SLP Evaluations $ SLP Speech Visit: 1 Visit SLP Evaluations $BSS Swallow: 1 Procedure $MBS Swallow: 1 Procedure SLP visit diagnosis: SLP Visit Diagnosis: Dysphagia,  pharyngoesophageal phase (R13.14) Past Medical History: Past Medical History: Diagnosis Date  Abnormality of gait 03/03/2013  bladder ca dx'd 11/2009  chemo/xrt comp 02/2010  Cerebrovascular disease   right brain CVA 2007  COPD (chronic obstructive pulmonary disease) (HCC)   Cough productive of clear sputum   TX RECENT SINUS INFECTION   CVA (cerebral vascular accident) (HCC) 2007  r-cva  Dyslipidemia   Hypertension   Lumbago  Past Surgical History: Past Surgical History: Procedure Laterality Date  ABDOMINAL AORTAGRAM N/Cheyanne Lamison 12/07/2013  Procedure: ABDOMINAL EZELLA;  Surgeon: Thomas GORMAN Blade, Ferrell;  Location: [facility] CATH LAB;  Service: Cardiovascular;  Laterality: N/Quenten Nawaz;  ABDOMINAL AORTOGRAM W/LOWER EXTREMITY N/Kennesha Brewbaker 06/26/2023  Procedure: ABDOMINAL AORTOGRAM W/LOWER EXTREMITY;  Surgeon: Lanis Fonda BRAVO, Ferrell;  Location: [facility] INVASIVE CV LAB;  Service: Cardiovascular;  Laterality: N/Dalinda Heidt;  BACK SURGERY  2010  bladder cancer    CAROTID ARTERY ANGIOPLASTY  2009  CHOLECYSTECTOMY N/Adelynn Gipe 06/06/2016  Procedure: LAPAROSCOPIC CHOLECYSTECTOMY WITH INTRAOPERATIVE CHOLANGIOGRAM;  Surgeon: Krystal Spinner, Ferrell;  Location: [facility] ORS;  Service: General;  Laterality: N/Neshawn Aird;  ERCP N/Fontaine Kossman 06/05/2016  Procedure: ENDOSCOPIC RETROGRADE CHOLANGIOPANCREATOGRAPHY (ERCP);  Surgeon: Norleen Hint, Ferrell;  Location: [facility];  Service: Endoscopy;  Laterality: N/Aarin Bluett;  ESOPHAGOGASTRODUODENOSCOPY (EGD) WITH PROPOFOL  N/Amair Shrout 06/05/2016  Procedure: ESOPHAGOGASTRODUODENOSCOPY (EGD);  Surgeon: Norleen Hint, Ferrell;  Location: [facility];  Service: Endoscopy;  Laterality: N/Essie Lagunes;  FEMORAL-FEMORAL BYPASS GRAFT N/Rosha Cocker 12/09/2013  Procedure: BYPASS GRAFT FEMORAL-FEMORAL ARTERY WITH BILATERAL ENDARTERECTOMY ;  Surgeon: Carlin BRAVO Haddock, Ferrell;  Location: [facility] OR;  Service: Vascular;  Laterality: N/Marque Rademaker;  FEMORAL-POPLITEAL BYPASS GRAFT Left 12/09/2013  Procedure: BYPASS GRAFT FEMORAL-POPLITEAL ARTERY - LEFT ;  Surgeon: Carlin BRAVO Haddock, Ferrell;  Location: [facility] OR;  Service: Vascular;  Laterality: Left;  LAMINECTOMY   09/08/2013  L 2 L3 L4 L5       Dr Amon  LOWER EXTREMITY ANGIOGRAPHY Right 07/03/2023  Procedure: Lower Extremity Angiography;  Surgeon: Lanis Fonda BRAVO, Ferrell;  Location: [facility] INVASIVE CV LAB;  Service: Cardiovascular;  Laterality: Right;  PERIPHERAL VASCULAR BALLOON ANGIOPLASTY Right 07/03/2023  Procedure: PERIPHERAL VASCULAR BALLOON ANGIOPLASTY;  Surgeon: Lanis Fonda BRAVO, Ferrell;  Location: [facility]  INVASIVE CV LAB;  Service: Cardiovascular;  Laterality: Right;  R external iliac [name] 05/21/2024, 10:24 AM       Scheduled Meds:  feeding supplement  237 mL Oral BID BM   fluticasone  furoate-vilanterol  1 puff Inhalation Daily   ipratropium-albuterol   3 mL Nebulization BID   irbesartan   150 mg Oral Daily   metoprolol  tartrate  12.5 mg Oral BID   pantoprazole   40 mg Oral Q0600   potassium chloride   40 mEq Oral Q4H   sertraline   75 mg Oral Daily   spironolactone   25 mg Oral Daily   traZODone   50 mg Oral QHS   Continuous Infusions:  magnesium  sulfate bolus IVPB 1 g (05/23/24 0740)     LOS: 4 days    Time spent: over 30 min    Meliton Monte, Ferrell [facility]   To contact the attending provider between 7A-7P or the covering provider during after hours 7P-7A, please log into the web site [email or web address] and access using universal [place] password for that web site. If you do not have the password, please call the hospital operator.  05/23/2024, 7:54 AM

## 2024-05-23 NOTE — Progress Notes (Signed)
 [facility] night cross cover note:   Regarding this patient with a history of paroxysmal atrial fibrillation , I was notified by the patient's RN that the patient is in atrial fibrillation with RVR this morning, with heart rates initially sustaining in the 140s.  RN conveys that the patient has currently asymptomatic.  Corresponding systolic blood pressures are in the 140s.  Additional vital signs notable for afebrile, respiratory rate 20, oxygen  saturation 100% on 3 L nasal cannula, which appears unchanged from earlier this evening.  Stat EKG is being obtained.  His hospital course is also notable for acute hypoxic respiratory failure in the setting of pneumonia, status post 5-day course of IV antibiotics.  I have ordered an updated procalcitonin level for this morning.  Per chart review, there is also been a concern for volume overload given previously elevated BNP.  BNP this morning appears to be trending down, now 264 compared to most recent prior value of nearly 1800.  Will obtain updated chest x-ray at this time.  Additional labs this morning are notable for the following: Potassium 3.0, magnesium  1.8.  It appears that his most recent metoprolol  tartrate, with reduced dose of 12.5 mg p.o. twice daily, has been held.  Will resume this reduced dose of metoprolol  to tartrate for now, with first dose now.  Will closely monitor for any ensuing bradycardia. I have also added prn IV Lopressor  for sustained heart rates greater than 130 bpm. I have ordered liquid Kcl 40 meq po q4 hours x 2 doses as well as magnesium  sulfate 1 g IV over 1 hour.      Eva Pore, DO Hospitalist

## 2024-05-23 NOTE — Progress Notes (Signed)
 Speech Language Pathology Treatment: Dysphagia  Patient Details Name: Thomas Ferrell MRN: [id number] DOB: 08-18-1941 Today's Date: 05/23/2024 Time: 0935-1000 SLP Time Calculation (min) (ACUTE ONLY): 25 min  Assessment / Plan / Recommendation Clinical Impression  Pt seen for dysphagia f/u tx session with provale cup given with thin liquids and trials of soft consistency with pt demonstrating delayed throat clearing intermittently during po trial.  Pt extremely eager to eat/drink food.  Family present for session with education provided re: aspiration risk/esophageal/swallow precautions and use of Provale cup to limit volume of thin liquid consumption. Discussed risk for aspiration given MBS results and recurrent PNA.  Palliative consult initiated and pt/family agreeable to assume risk with progression of diet.  Posted swallow precaution sign and gave esophageal precautions/Provale cup for safety/efficiency of swallow function.  ST will continue to f/u for dysphagia tx.   HPI HPI: Pt is an 83 y/o male who [facility]18[facility] post ground level fall. CXR shows Progressive superimposed left lower lobe airspace disease is consistent with pneumonia or aspiration pneumonitis. Pt has been see in prior visits for chronic coughing. Has not had any instrumental testing. PMH significant for PAD, carotid artery stenosis, HTN, prior history of tobacco abuse, stroke/TIA, COPD, anemia. Recent hospitalization 5/25 for SOB and COPD. MBS completed on 05/21/24 with significant esophageal rentention of liquids and solids. Silent aspiration of thins, nectar-thick, and honey-thick residue vs puree. ST f/u for diet tolerance/advancement of food/liquids with use of Provale cup.      SLP Plan  Continue with current plan of care          Recommendations  Diet recommendations: Thin liquid;Dysphagia 1 (puree) Liquids provided via: Cup (Provale) Medication Administration: Whole meds with puree Supervision: Patient able  to self feed;Intermittent supervision to cue for compensatory strategies Compensations: Slow rate;Small sips/bites;Multiple dry swallows after each bite/sip;Follow solids with liquid;Clear throat intermittently;Effortful swallow;Other (Comment) (use provale cup) Postural Changes and/or Swallow Maneuvers: Seated upright 90 degrees;Upright 30-60 min after meal                  Oral care BID   Frequent or constant Supervision/Assistance Dysphagia, pharyngoesophageal phase (R13.14)     Continue with current plan of care     Pat Caitlin Hillmer,M.S., CCC-SLP  05/23/2024, 11:15 AM

## 2024-05-23 NOTE — Plan of Care (Signed)

## 2024-05-23 NOTE — Plan of Care (Signed)
  Problem: Clinical Measurements: Goal: Ability to maintain clinical measurements within normal limits will improve Outcome: Progressing   Problem: Nutrition: Goal: Adequate nutrition will be maintained Outcome: Progressing   Problem: Pain Managment: Goal: General experience of comfort will improve and/or be controlled Outcome: Progressing

## 2024-05-24 DIAGNOSIS — J9601 Acute respiratory failure with hypoxia: Secondary | ICD-10-CM | POA: Diagnosis not present

## 2024-05-24 DIAGNOSIS — Z7189 Other specified counseling: Secondary | ICD-10-CM | POA: Diagnosis not present

## 2024-05-24 DIAGNOSIS — Z515 Encounter for palliative care: Secondary | ICD-10-CM | POA: Diagnosis not present

## 2024-05-24 LAB — CBC WITH DIFFERENTIAL/PLATELET
Abs Immature Granulocytes: 0.13 K/uL — ABNORMAL HIGH (ref 0.00–0.07)
Basophils Absolute: 0 K/uL (ref 0.0–0.1)
Basophils Relative: 0 %
Eosinophils Absolute: 0.4 K/uL (ref 0.0–0.5)
Eosinophils Relative: 3 %
HCT: 33.1 % — ABNORMAL LOW (ref 39.0–52.0)
Hemoglobin: 10.4 g/dL — ABNORMAL LOW (ref 13.0–17.0)
Immature Granulocytes: 1 %
Lymphocytes Relative: 4 %
Lymphs Abs: 0.6 K/uL — ABNORMAL LOW (ref 0.7–4.0)
MCH: 31.6 pg (ref 26.0–34.0)
MCHC: 31.4 g/dL (ref 30.0–36.0)
MCV: 100.6 fL — ABNORMAL HIGH (ref 80.0–100.0)
Monocytes Absolute: 1 K/uL (ref 0.1–1.0)
Monocytes Relative: 7 %
Neutro Abs: 13 K/uL — ABNORMAL HIGH (ref 1.7–7.7)
Neutrophils Relative %: 85 %
Platelets: 298 K/uL (ref 150–400)
RBC: 3.29 MIL/uL — ABNORMAL LOW (ref 4.22–5.81)
RDW: 16.6 % — ABNORMAL HIGH (ref 11.5–15.5)
WBC: 15.1 K/uL — ABNORMAL HIGH (ref 4.0–10.5)
nRBC: 0 % (ref 0.0–0.2)

## 2024-05-24 LAB — COMPREHENSIVE METABOLIC PANEL WITH GFR
ALT: 47 U/L — ABNORMAL HIGH (ref 0–44)
AST: 40 U/L (ref 15–41)
Albumin: 2.7 g/dL — ABNORMAL LOW (ref 3.5–5.0)
Alkaline Phosphatase: 82 U/L (ref 38–126)
Anion gap: 11 (ref 5–15)
BUN: 21 mg/dL (ref 8–23)
CO2: 26 mmol/L (ref 22–32)
Calcium: 8.9 mg/dL (ref 8.9–10.3)
Chloride: 102 mmol/L (ref 98–111)
Creatinine, Ser: 0.81 mg/dL (ref 0.61–1.24)
GFR, Estimated: >60 mL/min (ref >60)
Glucose, Bld: 110 mg/dL — ABNORMAL HIGH (ref 70–99)
Potassium: 4.2 mmol/L (ref 3.5–5.1)
Sodium: 139 mmol/L (ref 135–145)
Total Bilirubin: 1 mg/dL (ref 0.0–1.2)
Total Protein: 5.8 g/dL — ABNORMAL LOW (ref 6.5–8.1)

## 2024-05-24 LAB — MAGNESIUM: Magnesium: 2 mg/dL (ref 1.7–2.4)

## 2024-05-24 LAB — PHOSPHORUS: Phosphorus: 3.6 mg/dL (ref 2.5–4.6)

## 2024-05-24 MED ORDER — IPRATROPIUM-ALBUTEROL 0.5-2.5 (3) MG/3ML IN SOLN: 3.0000 mL | Freq: Four times a day (QID) | RESPIRATORY_TRACT | Status: DC | PRN

## 2024-05-24 MED ORDER — ONDANSETRON HCL 4 MG/2ML IJ SOLN
4.0000 mg | Freq: Three times a day (TID) | INTRAMUSCULAR | Status: DC | PRN
  Administered 2024-05-24: 4 mg via INTRAVENOUS
  Filled 2024-05-24: qty 2

## 2024-05-24 NOTE — Anesthesia Postprocedure Evaluation (Signed)
 Anesthesia Post Note  Patient: Thomas Ferrell  Procedure(s) Performed: EGD (ESOPHAGOGASTRODUODENOSCOPY)     Patient location during evaluation: PACU Anesthesia Type: MAC Level of consciousness: awake and alert Pain management: pain level controlled Vital Signs Assessment: post-procedure vital signs reviewed and stable Respiratory status: spontaneous breathing, nonlabored ventilation and respiratory function stable Cardiovascular status: blood pressure returned to baseline Postop Assessment: no apparent nausea or vomiting Anesthetic complications: no   No notable events documented.        Vertell Row

## 2024-05-24 NOTE — Plan of Care (Signed)
   Problem: Education: Goal: Knowledge of General Education information will improve Description: Including pain rating scale, medication(s)/side effects and non-pharmacologic comfort measures Outcome: Not Progressing

## 2024-05-24 NOTE — Consult Note (Addendum)
 Palliative Medicine Inpatient Consult Note  Consulting Provider:  Perri DELENA Meliton Mickey., MD   Reason for consult:  Goals of care  05/24/2024  HPI:  Per intake H&P --> Thomas Ferrell is an 83 y.o. male past medical history significant for peripheral arterial disease, carotid stenosis status post bilateral common femoral endarterectomy and bypass, essential hypertension, history of TIA, paroxysmal atrial fibrillation on Eliquis  COPD on 2 L of oxygen  at home per patient's report failure to thrive he called EMS as he could not get get up from the floor he did not hit his head. White blood cell count of 23,000 chest x-ray showed left lower lobe consolidation.  Clinical Assessment/Goals of Care:  *Please note that this is a verbal dictation therefore any spelling or grammatical errors are due to the Dragon Medical One system interpretation.  I have reviewed medical records including EPIC notes, labs and imaging, received report from bedside RN, assessed the patient.    I met with Thomas Ferrell, his wife, Thomas Ferrell, and his son  to further discuss diagnosis prognosis, GOC, EOL wishes, disposition and options.   I introduced Palliative Medicine as specialized medical care for people living with serious illness. It focuses on providing relief from the symptoms and stress of a serious illness. The goal is to improve quality of life for both the patient and the family.  Medical History Review and Understanding:  I have reviewed with Thomas Ferrell his past medical history significant for bladder cancer, COPD, hypertension, CVA, and carotid stenosis.  Social History:  Thomas Ferrell shares with me that he is from [place]  originally.  He moved to [place]  on account of his job.  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  He and his wife, Thomas Ferrell have been married for the past 49 years.  They share 2 sons and 2 granddaughters.  Thomas Ferrell is a man of faith practicing within the [employer]  denomination.     Functional and Nutritional State:  Preceding hospitalization Thomas Ferrell lived in a single-family home and his wife Thomas Ferrell was his caregiver.  Thomas Ferrell shares he has had multiple falls in the home.  He has 2 walkers though despite this continue to have accidents.  He does require help with bathing but shares he can dress himself for the most part.  Thomas Ferrell wife does meal and medication preparation.  Thomas Ferrell does not feel that he is had recent weight loss though does feel his appetite has decreased.  Advance Directives:  A detailed discussion was had today regarding advanced directives.  Thomas Ferrell shares if he were unable to make decisions for himself he would desire his wife, Thomas Ferrell be his Runner, broadcasting/film/video.  Code Status:  Concepts specific to code status, artifical feeding and hydration, continued IV antibiotics and rehospitalization was had.  The difference between a aggressive medical intervention path  and a palliative comfort care path for this patient at this time was had.   Thomas Ferrell is an established DO NOT RESUSCITATE DO NOT INTUBATE CODE STATUS and a MOST form was completed as below:  Cardiopulmonary Resuscitation: Do Not Attempt Resuscitation (DNR/No CPR)  Medical Interventions: Limited Additional Interventions: Use medical treatment, IV fluids and cardiac monitoring as indicated, DO NOT USE intubation or mechanical ventilation. May consider use of less invasive airway support such as BiPAP or CPAP. Also provide comfort measures. Transfer to the hospital if indicated. Avoid intensive care.   Antibiotics: Determine use of limitation of antibiotics when infection occurs  IV Fluids:  IV fluids for a defined trial period  Feeding Tube: No feeding tube    Thomas Ferrell does not want to be re-hospitalized  Discussion:  Thomas Ferrell reviewed that he has been getting weaker placing more strain on his wife to provide caregiver activities.  His wife shares that in the setting of his decline  over the past few months it has become harder and harder for her to care for him.  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  He is in agreement if this needs to be a long-term care setting for him.  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  We reviewed unfortunately aspiration pneumonia is common and has a high likelihood of reoccurring in the setting of this.  Patient's son and wife understand this.  We reviewed Thomas Ferrell profound weakness in the setting of his left lower extremity neuropathy.  We reviewed the frequent falls which she has had in his home and his inability to provide self-care.  We discussed the options from the perspective of palliative care as an outpatient versus hospice care:  Palliative care is specialized medical care for people living with a serious illness, such as cancer, heart failure, COPD, Alzheimer's dementia, etc. Patients in palliative care may receive medical care for their symptoms, or palliative care, along with treatment intended to cure their serious illness   Hospice care focuses on the care, comfort, and quality of life of a person with a serious illness who is approaching the end of life. At some point, it may not be possible to cure a serious illness, or a patient may choose not to undergo certain treatments. Hospice is designed for this situation.  At this point in time Thomas Ferrell shares that he is not ready for hospice care though is agreeable to outpatient palliative support.  Patient's family request the medical social work team come and speak to them about placement from here.  I shared I would reach out to them.  Discussed the importance of continued conversation with family and their  medical providers regarding overall plan of care and treatment options, ensuring decisions  are within the context of the patients values and GOCs.  Decision Maker: Thomas Ferrell, Thomas Ferrell (Spouse): [phone] (Home Phone)   SUMMARY OF RECOMMENDATIONS   DNAR/DNI  Does not want rehospitalization  MOST / DNR Form Completed, paper copy placed onto the chart electric copy can be found in [employer]  Open and honest conversations held in the setting of patient's esophageal dysmotility and ongoing risk of aspirational events  Discussed palliative care versus hospice support in the outpatient setting --> patient would like to pursue outpatient palliative care   Discussed the idea of rehospitalization's which patient shares he would not want however he would want treatment at the facility he is  Appreciate medical social work team discussing with Thomas Ferrell and his family placement options --> they would ideally like him to go to the [facility] [place] skilled nursing facility  Ongoing palliative care support  Code Status/Advance Care Planning: DNAR/DNI  Palliative Prophylaxis:  Aspiration, Bowel Regimen, Delirium Protocol, Frequent Pain Assessment, Oral Care, Palliative Wound Care, and Turn Reposition  Additional Recommendations (Limitations, Scope, Preferences): Continue present care  Psycho-social/Spiritual:  Desire for further Chaplaincy support: Yes -areas [facility] Additional Recommendations: Education on chronic aspiration   Prognosis: Hospice is an appropriate consideration though patient is not  ready for it at this point.  I would wary that patient has a high risk for clinical deterioration in the near future given his chronic disease burden and aspiration risk.  Discharge Planning: Discharge to skilled nursing facility though which 1 is presently unknown.  Vitals:   05/24/24 0040 05/24/24 0355  BP: (!) 143/62 (!) 149/58  Pulse: 78 73  Resp: 20 18  Temp: 98.2 F (36.8 C) (!) 97.4 F (36.3 C)  SpO2: 97% 97%    Intake/Output Summary (Last 24 hours) at 05/24/2024 0703 Last  data filed at 05/23/2024 2118 Gross per 24 hour  Intake 380 ml  Output --  Net 380 ml   Last Weight  Most recent update: 05/23/2024  2:38 AM    Weight  49.9 kg (110 lb 0.2 oz)            Gen: Elderly Caucasian male chronically ill in appearance HEENT: Dry mucous membranes CV: Regular rate and rhythm PULM: 3 L nasal cannula breathing is even and nonlabored ABD: soft/nontender EXT: No edema Neuro: Alert and oriented x3  PPS: 50%   This conversation/these recommendations were discussed with patient primary care team, Dr. Perri ______________________________________________________ Rosaline [facility] Palliative Medicine Team Team Cell Phone: [phone] Please utilize secure chat with additional questions, if there is no response within 30 minutes please call the above phone number  Total Time: 75 Billing based on MDM: High  Palliative Medicine Team providers are available by phone from 7am to 7pm daily and can be reached through the team cell phone.  Should this patient require assistance outside of these hours, please call the patient's attending physician.

## 2024-05-24 NOTE — Plan of Care (Signed)
  Problem: Clinical Measurements: Goal: Respiratory complications will improve Outcome: Progressing   Problem: Clinical Measurements: Goal: Cardiovascular complication will be avoided Outcome: Progressing   Problem: Coping: Goal: Level of anxiety will decrease Outcome: Progressing   

## 2024-05-24 NOTE — Progress Notes (Addendum)
 PROGRESS NOTE    Thomas Ferrell  FMW:[id number] DOB: 10-26-1941 DOA: 05/18/2024 PCP: [name], MD  Chief Complaint  Patient presents with   Fall    Brief Narrative:   Thomas Ferrell is an 83 y.o. male past medical history significant for peripheral arterial disease, carotid stenosis status post bilateral common femoral endarterectomy and bypass, essential hypertension, history of TIA, paroxysmal atrial fibrillation on Eliquis  COPD on 2 L of oxygen  at home per patient's report failure to thrive he called EMS as he could not get get up from the floor he did not hit his head.  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  Currently on dysphagia 1 diet.    Acute Hypoxic Respiratory Failure secondary to Pneumonia Reportedly on 2 L chronically - need to clarify this with his wife - > he's not on any oxygen  at baseline  Currently on 3 L CXR at presentation with superimposed LLL airspace disease c/w pneumonia or aspiration pneumonitis CXR 7/9 with progressive superimposed airspace disease in the L lung, chronic interstitial disease Ceftriaxone , doxycycline  - he's completed 5 day course.  As he's afebrile - notably leukocytosis today - holding on more prolonged abx course for now.  Will monitor clinically.  Urine strep, legionella, MRSA PCR, sputum cx - these weren't collected - will d/c as likely low yield with suspicion for aspiration ' Leukocytosis today, will trend, afebrile.  Repeat CXR 7/14 am.  Blood cx NGTD SLP eval with concern for aspiration -  suspect this is cause of his presentation  Elevated BNP  Concern for Volume Overload Clinically, doesn't appear overloaded, but BNP elevated from prior and worsening hypoxia with abnormal CXR Repeat echo with grade II diastolic dysfunction, EF 60-65%, mild AV stenosis, IVC normal with greater than 50% resp variability He's tolerated diuresis Will hold further diuresis for now, low threshold to resume  Dysphagia  Concern for Aspiration MBS 7/10, concern for poor esophageal clearance of liquids and solids GI has been consulted -> plan for EGD today -> with abnormal esophageal motility, 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   SLP recommending dysphagia 1, thins at this time  Appreciate palliative conversations  Acute Metabolic Encephalopathy Concern for Cognitive Deficit Per EDP note, hallucinating prior to admission Still confused, will work on better picture of baseline mental status - some memory issues the last few months.  Issues with short term memory recently, getting progressively worse.  He's been combative here, impulsive.   Neurology follow up outpatient - trial trazodone  for sleep Delirium precautions Ammonia wnl, TSH wnl, RPR pending.  B12 elevated.  VBG without hypercarbia.   Paroxysmal atrial fibrillation on Eliquis   RVR: RVR 7/11-12 overnight, metop (had been held earlier due to bradycardia) Eliquis  on hold with his large hematoma below  Avoid NSAIDs   Hypokalemia Replace and follow   Iron Deficiency Anemia Check [place] B12 (elevated) and folate (wnl) Iron is low trend   Physical deconditioning/ Fall: PT OT consulted.  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  Right Forearm Hematoma 9.4 x 4.4 x 2.7 cm hematoma in subcutaneous tissues of proximal dorsal forearm - seems to be improving per discussion with his wife - relatively stable today again - per wife, has been this size for Lark Runk while now Given  lack of additional improvement, will discuss with ortho Monitor off anticoagulation   Essential hypertension: Irbesartan , metoprolol , spironolactone    Moderate Malnutrition  Noted, RD c/s Body mass index is 18.62 kg/m.  Dyslipidemia Hold statin for now  Sacral decubitus ulcer present on admission unstageable    DVT prophylaxis: SCD Code Status: DNR  Family Communication: wife 7/13  Disposition:   Status is: Inpatient Remains inpatient appropriate because: need for ongoing inpatient care   Consultants:  none  Procedures:  none  Antimicrobials:  Anti-infectives (From admission, onward)    Start     Dose/Rate Route Frequency Ordered Stop   05/19/24 1015  doxycycline  (VIBRA -TABS) tablet 100 mg        100 mg Oral Every 12 hours 05/19/24 0916 05/22/24 2109   05/19/24 0800  cefTRIAXone  (ROCEPHIN ) 1 g in sodium chloride  0.9 % 100 mL IVPB        1 g 200 mL/hr over 30 Minutes Intravenous Every 24 hours 05/18/24 1349 05/22/24 0812   05/18/24 1445  azithromycin  (ZITHROMAX ) tablet 500 mg  Status:  Discontinued        500 mg Oral Daily 05/18/24 1349 05/19/24 0916   05/18/24 0615  vancomycin  (VANCOCIN ) IVPB 1000 mg/200 mL premix        1,000 mg 200 mL/hr over 60 Minutes Intravenous  Once 05/18/24 0608 05/18/24 0733   05/18/24 0615  ceFEPIme  (MAXIPIME ) 2 g in sodium chloride  0.9 % 100 mL IVPB        2 g 200 mL/hr over 30 Minutes Intravenous  Once 05/18/24 0608 05/18/24 0656       Subjective: No complaints Jokes when I say I'll get out of his hair I don't have much  Objective: Vitals:   05/24/24 0040 05/24/24 0355 05/24/24 0707 05/24/24 0732  BP: (!) 143/62 (!) 149/58 136/60   Pulse: 78 73 70   Resp: 20 18 17    Temp: 98.2 F (36.8 C) (!) 97.4 F (36.3 C) (!) 97.4 F (36.3 C)   TempSrc: Oral Oral Oral   SpO2: 97% 97% 100%   Weight:    49.2 kg  Height:        Intake/Output Summary (Last 24 hours) at 05/24/2024 0906 Last data filed at 05/24/2024 0824 Gross per  24 hour  Intake 498 ml  Output --  Net 498 ml   Filed Weights   05/22/24 1520 05/23/24 0100 05/24/24 0732  Weight: 47.7 kg 49.9 kg 49.2 kg    Examination:  General: No acute distress. Cardiovascular: RRR Lungs: unlabored Neurological: Alert and oriented 3. Moves all extremities 4. Cranial nerves II through XII grossly intact. Extremities: No clubbing or cyanosis. No edema. Unchanged r arm hematoma.  Data Reviewed: I have personally reviewed following labs and imaging studies  CBC: Recent Labs  Lab 05/20/24 0819 05/21/24 0028 05/22/24 0251 05/23/24 0320 05/24/24 0321  WBC 11.6* 15.7* 11.6* 9.2 15.1*  NEUTROABS 10.8* 13.8* 10.1* 7.6 13.0*  HGB 8.8* 9.7* 9.6* 9.3* 10.4*  HCT 27.1* 29.8* 29.9* 29.3* 33.1*  MCV 99.3 99.7 99.0 100.3* 100.6*  PLT 244 300 292 260 298    Basic Metabolic Panel: Recent Labs  Lab 05/20/24 0819 05/21/24 0028 05/22/24 0251 05/23/24 0320 05/24/24 0321  NA 138  138 141 137 139  K 3.3* 4.2 3.6 3.0* 4.2  CL 105 102 99 99 102  CO2 22 22 30 24 26   GLUCOSE 166* 130* 111* 99 110*  BUN 11 13 15 18 21   CREATININE 0.73 0.74 0.77 0.69 0.81  CALCIUM  8.3* 8.5* 8.5* 8.2* 8.9  MG  --  1.1* 2.1 1.8 2.0  PHOS  --  2.5 3.4 3.6 3.6    GFR: Estimated Creatinine Clearance: 49.8 mL/min (by C-G formula based on SCr of 0.81 mg/dL).  Liver Function Tests: Recent Labs  Lab 05/20/24 0819 05/21/24 0028 05/22/24 0251 05/23/24 0320 05/24/24 0321  AST 43* 53* 49* 54* 40  ALT 33 41 47* 53* 47*  ALKPHOS 63 65 74 69 82  BILITOT 1.0 0.9 1.1 1.0 1.0  PROT 5.3* 6.1* 5.9* 5.6* 5.8*  ALBUMIN 2.5* 2.9* 2.7* 2.5* 2.7*    CBG: No results for input(s): GLUCAP in the last 168 hours.   Recent Results (from the past 240 hours)  Blood Culture (routine x 2)     Status: None   Collection Time: 05/18/24  1:21 AM   Specimen: BLOOD  Result Value Ref Range Status   Specimen Description BLOOD LEFT ARM  Final   Special Requests   Final    BOTTLES DRAWN AEROBIC AND  ANAEROBIC Blood Culture adequate volume   Culture   Final    NO GROWTH 5 DAYS Performed at [facility], [address], [place], [place] [address]    Report Status 05/23/2024 FINAL  Final  Blood Culture (routine x 2)     Status: None   Collection Time: 05/18/24  1:26 AM   Specimen: BLOOD RIGHT HAND  Result Value Ref Range Status   Specimen Description BLOOD RIGHT HAND  Final   Special Requests   Final    BOTTLES DRAWN AEROBIC AND ANAEROBIC Blood Culture adequate volume   Culture   Final    NO GROWTH 5 DAYS Performed at [facility], [address] [place], [place] [address]    Report Status 05/23/2024 FINAL  Final         Radiology Studies: LONG TERM MONITOR (3-14 DAYS) Result Date: 05/23/2024   Patient had Shalla Bulluck minimum heart rate of 48 bpm, maximum heart rate of 207 bpm, and average heart rate of 79 bpm. Predominant underlying rhythm was sinus rhythm. Frequent SVT, longest lasting 15 seconds. Isolated PACs were frequent (14%). Isolated PVCs were occasional (4%). Frequent PACs and SVT.  DG Chest Port 1 View Result Date: 05/23/2024 EXAM: 1 VIEW XRAY OF THE CHEST 05/23/2024 07:09:19 AM COMPARISON: 1 view chest x-ray 03/20/2024 and 1-view chest x-ray 05/09/2024. CLINICAL HISTORY: Atrial fibrillation with RVR (HCC) [id number]. Mylinh Cragg-fib with RVR; hx of fall. FINDINGS: LUNGS AND PLEURA: Progressive superimposed airspace disease is present in the left lung. Chronic interstitial disease is again noted. No pleural effusion. No pneumothorax. HEART AND MEDIASTINUM: Atherosclerotic changes are again noted at the aortic arch. No acute abnormality of the cardiac and mediastinal silhouettes. BONES AND SOFT TISSUES: Degenerative changes are present. No acute osseous abnormality. IMPRESSION: 1. Progressive superimposed airspace disease in the left lung. 2. Chronic interstitial disease. Electronically signed by: Lonni Necessary MD 05/23/2024 07:22 AM EDT RP Workstation: [id number]         Scheduled Meds:  feeding supplement  237 mL Oral BID BM   fluticasone  furoate-vilanterol  1 puff Inhalation Daily   ipratropium-albuterol   3 mL Nebulization BID   irbesartan   150 mg  Oral Daily   metoprolol  tartrate  12.5 mg Oral BID   pantoprazole   40 mg Oral Q0600   sertraline   75 mg Oral Daily   spironolactone   25 mg Oral Daily   traZODone   50 mg Oral QHS   Continuous Infusions:     LOS: 5 days    Time spent: over 30 min    Meliton Monte, MD [facility]   To contact the attending provider between 7A-7P or the covering provider during after hours 7P-7A, please log into the web site [email or web address] and access using universal [place] password for that web site. If you do not have the password, please call the hospital operator.  05/24/2024, 9:06 AM

## 2024-05-24 NOTE — TOC Progression Note (Signed)
 Transition of Care [facility]) - Progression Note    Patient Details  Name: Thomas Ferrell MRN: [id number] Date of Birth: 08/15/1941  [facility] CM/SW Contact  Sheri ONEIDA [place], [place] Phone Number: 05/24/2024, 11:38 AM  Clinical Narrative:    CSW met with pt, pt wife and son at bedside to discuss dc plan. Wife wishes for pt to go to [place]. Per MD, pt medically ready and can dc when bed is available. [facility] at [place] confirmed bed will be available Monday. CSW started insurance auth; pending approval. Will need to check auth in AM.   Expected Discharge Plan: Skilled Nursing Facility Barriers to Discharge: Continued Medical Work up, SNF Pending bed offer  Expected Discharge Plan and Services In-house Referral: Clinical Social Work   Post Acute Care Choice: Skilled Nursing Facility (per wife) Living arrangements for the past 2 months: Single Family Home                                       Social Determinants of Health (SDOH) Interventions SDOH Screenings   Food Insecurity: No Food Insecurity (05/18/2024)  Housing: Low Risk  (05/18/2024)  Transportation Needs: No Transportation Needs (05/18/2024)  Utilities: Not At Risk (05/18/2024)  Financial Resource Strain: Low Risk  (04/28/2024)  Social Connections: Patient Unable To Answer (05/18/2024)  Recent Concern: Social Connections - Moderately Isolated (03/18/2024)  Tobacco Use: Medium Risk (05/22/2024)  Health Literacy: Adequate Health Literacy (04/28/2024)    Readmission Risk Interventions    03/19/2024    1:30 PM  Readmission Risk Prevention Plan  Post Dischage Appt Complete  Medication Screening Complete  Transportation Screening Complete

## 2024-05-25 ENCOUNTER — Inpatient Hospital Stay (HOSPITAL_COMMUNITY)

## 2024-05-25 DIAGNOSIS — J9601 Acute respiratory failure with hypoxia: Secondary | ICD-10-CM | POA: Diagnosis not present

## 2024-05-25 DIAGNOSIS — Z515 Encounter for palliative care: Secondary | ICD-10-CM | POA: Diagnosis not present

## 2024-05-25 LAB — CBC
HCT: 36 % — ABNORMAL LOW (ref 39.0–52.0)
Hemoglobin: 11 g/dL — ABNORMAL LOW (ref 13.0–17.0)
MCH: 31.1 pg (ref 26.0–34.0)
MCHC: 30.6 g/dL (ref 30.0–36.0)
MCV: 101.7 fL — ABNORMAL HIGH (ref 80.0–100.0)
Platelets: 294 K/uL (ref 150–400)
RBC: 3.54 MIL/uL — ABNORMAL LOW (ref 4.22–5.81)
RDW: 16.4 % — ABNORMAL HIGH (ref 11.5–15.5)
WBC: 20.3 K/uL — ABNORMAL HIGH (ref 4.0–10.5)
nRBC: 0 % (ref 0.0–0.2)

## 2024-05-25 LAB — BASIC METABOLIC PANEL WITH GFR
Anion gap: 12 (ref 5–15)
BUN: 21 mg/dL (ref 8–23)
CO2: 25 mmol/L (ref 22–32)
Calcium: 8.9 mg/dL (ref 8.9–10.3)
Chloride: 99 mmol/L (ref 98–111)
Creatinine, Ser: 0.76 mg/dL (ref 0.61–1.24)
GFR, Estimated: >60 mL/min (ref >60)
Glucose, Bld: 99 mg/dL (ref 70–99)
Potassium: 4.5 mmol/L (ref 3.5–5.1)
Sodium: 136 mmol/L (ref 135–145)

## 2024-05-25 LAB — MAGNESIUM: Magnesium: 1.9 mg/dL (ref 1.7–2.4)

## 2024-05-25 LAB — MRSA NEXT GEN BY PCR, NASAL: MRSA by PCR Next Gen: NOT DETECTED

## 2024-05-25 LAB — PHOSPHORUS: Phosphorus: 3.5 mg/dL (ref 2.5–4.6)

## 2024-05-25 MED ORDER — ADULT MULTIVITAMIN W/MINERALS CH
1.0000 | ORAL_TABLET | Freq: Every day | ORAL | Status: DC
  Administered 2024-05-25 – 2024-05-26 (×2): 1 via ORAL
  Filled 2024-05-25 (×2): qty 1

## 2024-05-25 MED ORDER — SODIUM CHLORIDE 0.9 % IV SOLN
3.0000 g | Freq: Four times a day (QID) | INTRAVENOUS | Status: DC
  Administered 2024-05-25 – 2024-05-26 (×5): 3 g via INTRAVENOUS
  Filled 2024-05-25 (×5): qty 8

## 2024-05-25 MED ORDER — ENSURE PLUS HIGH PROTEIN PO LIQD
237.0000 mL | Freq: Three times a day (TID) | ORAL | Status: DC
  Administered 2024-05-25 – 2024-05-26 (×2): 237 mL via ORAL

## 2024-05-25 MED ORDER — LOPERAMIDE HCL 2 MG PO CAPS
2.0000 mg | ORAL_CAPSULE | ORAL | Status: DC | PRN
  Administered 2024-05-25: 2 mg via ORAL
  Filled 2024-05-25: qty 1

## 2024-05-25 NOTE — Progress Notes (Signed)
 TRH night cross cover note:   I was notified by the patient's RN that the patient has had a few episodes overnight of loose watery non-bloody stool in the absence of any associated abdominal pain and in the absence of any fever.  I added prn imodium .     Eva Pore, DO Hospitalist

## 2024-05-25 NOTE — Progress Notes (Signed)
 Physical Therapy Treatment Patient Details Name: Thomas Ferrell MRN: [id number] DOB: [birth year] Today's Date: 05/25/2024   History of Present Illness 83 y/o male adm 05/18/24 after ground level fall with Rt forearm hematoma and CAP.  7/11 EGD. PMHx: PAD, carotid artery stenosis, HTN,  stroke/TIA, COPD, anemia, PAF on Eliquis     PT Comments  Pt pleasant, reporting sacral soreness and wanting to get OOB. Pt able to transfer with RW with continued sacral pain even in chair with pillow, educated for pressure relief and positional changes. Pt states desire to move more and get stronger, educated for HEP and encouraged to be OOB daily with staff and up for toileting and meals.   96% SPO2 on 2L   If plan is discharge home, recommend the following: A lot of help with bathing/dressing/bathroom;Assistance with cooking/housework;Assist for transportation;Help with stairs or ramp for entrance;A little help with walking and/or transfers   Can travel by private vehicle     Yes  Equipment Recommendations  BSC/3in1;Wheelchair (measurements PT);Wheelchair cushion (measurements PT)    Recommendations for Other Services       Precautions / Restrictions Precautions Precautions: Fall Recall of Precautions/Restrictions: Intact Precaution/Restrictions Comments: watch SPO2     Mobility  Bed Mobility Overal bed mobility: Needs Assistance Bed Mobility: Supine to Sit     Supine to sit: Supervision, HOB elevated, Used rails     General bed mobility comments: HOB 35 degrees with use of rail and increased time, supervision for lines    Transfers Overall transfer level: Needs assistance   Transfers: Sit to/from Stand Sit to Stand: Contact guard assist   Step pivot transfers: Contact guard assist       General transfer comment: cues for hand placement and safety to rise from bed, step pivot 3' to chair with RW with cues for direction and sequence. Pt performed 5 repeated sit to stands from chair  with cues for hand placement and controlled descent    Ambulation/Gait               General Gait Details: pt denied attempting gait this date   Stairs             Wheelchair Mobility     Tilt Bed    Modified Rankin (Stroke Patients Only)       Balance Overall balance assessment: Needs assistance Sitting-balance support: No upper extremity supported, Feet supported Sitting balance-Leahy Scale: Fair Sitting balance - Comments: static sitting EOB with supervision for safety   Standing balance support: Bilateral upper extremity supported, During functional activity, Reliant on assistive device for balance Standing balance-Leahy Scale: Poor Standing balance comment: Rw with gait                            Communication Communication Communication: No apparent difficulties  Cognition Arousal: Alert Behavior During Therapy: WFL for tasks assessed/performed   PT - Cognitive impairments: No family/caregiver present to determine baseline                       PT - Cognition Comments: pt with decreased attention and awareness, pt couldn't find glasses in bed on arrival Following commands: Impaired Following commands impaired: Only follows one step commands consistently    Cueing Cueing Techniques: Verbal cues  Exercises General Exercises - Lower Extremity Long Arc Quad: AROM, Both, 20 reps, Seated Hip Flexion/Marching: AROM, Both, 20 reps, Seated    General Comments  Pertinent Vitals/Pain Pain Assessment Pain Assessment: 0-10 Pain Score: 5  Pain Location: sacrum Pain Descriptors / Indicators: Discomfort, Sore Pain Intervention(s): Limited activity within patient's tolerance, Monitored during session, Repositioned    Home Living                          Prior Function            PT Goals (current goals can now be found in the care plan section) Progress towards PT goals: Progressing toward goals     Frequency    Min 2X/week      PT Plan      Co-evaluation              AM-PAC PT 6 Clicks Mobility   Outcome Measure  Help needed turning from your back to your side while in a flat bed without using bedrails?: A Little Help needed moving from lying on your back to sitting on the side of a flat bed without using bedrails?: A Little Help needed moving to and from a bed to a chair (including a wheelchair)?: A Little Help needed standing up from a chair using your arms (e.g., wheelchair or bedside chair)?: A Little Help needed to walk in hospital room?: Total Help needed climbing 3-5 steps with a railing? : Total 6 Click Score: 14    End of Session Equipment Utilized During Treatment: Oxygen  Activity Tolerance: Patient tolerated treatment well Patient left: in chair;with call bell/phone within reach;with chair alarm set Nurse Communication: Mobility status PT Visit Diagnosis: Difficulty in walking, not elsewhere classified (R26.2);Muscle weakness (generalized) (M62.81);Other abnormalities of gait and mobility (R26.89)     Time: 8773-8744 PT Time Calculation (min) (ACUTE ONLY): 29 min  Charges:    $Therapeutic Exercise: 8-22 mins $Therapeutic Activity: 8-22 mins PT General Charges $$ ACUTE PT VISIT: 1 Visit                     Lenoard SQUIBB, PT Acute Rehabilitation Services Office: [phone]    Chaslyn Eisen B Honey Zakarian 05/25/2024, 1:32 PM

## 2024-05-25 NOTE — Progress Notes (Signed)
 [facility] Room 3E11C [facility] Liaison Note:  Notified by [facility] Luise Pan of patient request for [facility] Palliative services at home after discharge.  Hospital liaison will follow patient for discharge disposition.  Please call with any hospice or outpatient palliative care related questions.  Thank you for the opportunity to participate in this patient's care.  Greig Basket, RN, BSN [facility] [phone]

## 2024-05-25 NOTE — Progress Notes (Signed)
   Palliative Medicine Inpatient Follow Up Note HPI: Thomas Ferrell is an 83 y.o. male past medical history significant for peripheral arterial disease, carotid stenosis status post bilateral common femoral endarterectomy and bypass, essential hypertension, history of TIA, paroxysmal atrial fibrillation on Eliquis  COPD on 2 L of oxygen  at home per patient's report failure to thrive he called EMS as he could not get get up from the floor he did not hit his head. White blood cell count of 23,000 chest x-ray showed left lower lobe consolidation.   Today's Discussion 05/25/2024  *Please note that this is a verbal dictation therefore any spelling or grammatical errors are due to the Dragon Medical One system interpretation.  Chart reviewed inclusive of vital signs, progress notes, laboratory results, and diagnostic images. WBC increased today.   I met with Thomas Ferrell at bedside this morning. He shares readiness to get out of here. He expresses feelings of defeat in the setting of his wife needing for Thomas Ferrell to go somewhere to get additional care. We reviewed how difficult it is to receive and give care. Created space and opportunity for patient to explore thoughts feelings and fears regarding current medical situation.   Thomas Ferrell does recognize the ongoing risk and likely active aspirational events which are occurring.   Thomas Ferrell denies pain, shortness of breath, or nausea this morning.   Questions and concerns addressed/Palliative Support Provided.   Objective Assessment: Vital Signs Vitals:   05/25/24 0759 05/25/24 1139  BP: (!) 126/54 (!) 116/51  Pulse: 65 72  Resp: 18 18  Temp: 98.2 F (36.8 C) 98.5 F (36.9 C)  SpO2: 100% 98%    Intake/Output Summary (Last 24 hours) at 05/25/2024 1227 Last data filed at 05/25/2024 1138 Gross per 24 hour  Intake 420 ml  Output 275 ml  Net 145 ml   Last Weight  Most recent update: 05/25/2024  4:54 AM    Weight  47.4 kg (104 lb 8 oz)            Gen:  Elderly Caucasian male chronically ill in appearance HEENT: Dry mucous membranes CV: Regular rate and rhythm PULM: 3 L nasal cannula breathing is even and nonlabored ABD: soft/nontender EXT: No edema Neuro: Alert and oriented x3  SUMMARY OF RECOMMENDATIONS   DNAR/DNI   Does not want rehospitalization   MOST / DNR Form Completed, paper copy placed onto the chart electric copy can be found in [facility]   Plan for transition to [facility] with OP Palliative support once medically optimized   Ongoing palliative care support ______________________________________________________________________________________ Rosaline Becton [place] Palliative Medicine Team Team Cell Phone: [phone] Please utilize secure chat with additional questions, if there is no response within 30 minutes please call the above phone number  Billing based on MDM: Moderate   Palliative Medicine Team providers are available by phone from 7am to 7pm daily and can be reached through the team cell phone.  Should this patient require assistance outside of these hours, please call the patient's attending physician.

## 2024-05-25 NOTE — Progress Notes (Addendum)
 Physical Therapy Treatment Patient Details Name: Thomas Ferrell MRN: [id number] DOB: 1941/08/27 Today's Date: 05/25/2024   History of Present Illness 83 y/o male adm 05/18/24 after ground level fall with Rt forearm hematoma and CAP.  7/11 EGD. PMHx: PAD, carotid artery stenosis, HTN,  stroke/TIA, COPD, anemia, PAF on Eliquis     PT Comments  Responded to chair alarm going off in room and found pt perched on EOB with lines tangled. Worked on standing and balance in order to untangle lines and get him positioned safely in bed. Pt with decr safety awareness. Patient will benefit from continued inpatient follow up therapy, <3 hours/day.     If plan is discharge home, recommend the following: A lot of help with bathing/dressing/bathroom;Assistance with cooking/housework;Assist for transportation;Help with stairs or ramp for entrance;A little help with walking and/or transfers   Can travel by private vehicle     Yes  Equipment Recommendations  BSC/3in1;Wheelchair (measurements PT);Wheelchair cushion (measurements PT)    Recommendations for Other Services       Precautions / Restrictions Precautions Precautions: Fall Recall of Precautions/Restrictions: Impaired Precaution/Restrictions Comments: Pt found with chair alarm going off after getting up from chair. Restrictions Weight Bearing Restrictions Per Provider Order: No     Mobility  Bed Mobility Overal bed mobility: Needs Assistance Bed Mobility: Sit to Supine       Sit to supine: Supervision   General bed mobility comments: For safety and lines    Transfers Overall transfer level: Needs assistance Equipment used: 1 person hand held assist Transfers: Sit to/from Stand Sit to Stand: Min assist           General transfer comment: Assist to power up from bed and stabilize. Pt impulsive and not responding to cues    Ambulation/Gait             Pre-gait activities: Side stepped up side of bed with min assist      Stairs             Wheelchair Mobility     Tilt Bed    Modified Rankin (Stroke Patients Only)       Balance Overall balance assessment: Needs assistance Sitting-balance support: No upper extremity supported, Feet supported Sitting balance-Leahy Scale: Fair     Standing balance support: Single extremity supported Standing balance-Leahy Scale: Poor Standing balance comment: UE support and min assist                            Communication Communication Communication: No apparent difficulties  Cognition Arousal: Alert Behavior During Therapy: Impulsive   PT - Cognitive impairments: No family/caregiver present to determine baseline, Safety/Judgement, Awareness                       PT - Cognition Comments: Pt with decr safety awareness Following commands: Impaired Following commands impaired: Only follows one step commands consistently    Cueing Cueing Techniques: Verbal cues  Exercises      General Comments        Pertinent Vitals/Pain Pain Assessment Pain Assessment: Faces Faces Pain Scale: Hurts little more Pain Location: neck Pain Descriptors / Indicators: Discomfort, Sore Pain Intervention(s): Repositioned    Home Living                          Prior Function            PT Goals (  current goals can now be found in the care plan section) Progress towards PT goals: Progressing toward goals    Frequency    Min 2X/week      PT Plan      Co-evaluation              AM-PAC PT 6 Clicks Mobility   Outcome Measure  Help needed turning from your back to your side while in a flat bed without using bedrails?: A Little Help needed moving from lying on your back to sitting on the side of a flat bed without using bedrails?: A Little Help needed moving to and from a bed to a chair (including a wheelchair)?: A Little Help needed standing up from a chair using your arms (e.g., wheelchair or bedside  chair)?: A Little Help needed to walk in hospital room?: Total Help needed climbing 3-5 steps with a railing? : Total 6 Click Score: 14    End of Session Equipment Utilized During Treatment: Oxygen  Activity Tolerance: Patient tolerated treatment well Patient left: in bed;with bed alarm set Nurse Communication: Mobility status PT Visit Diagnosis: Difficulty in walking, not elsewhere classified (R26.2);Muscle weakness (generalized) (M62.81);Other abnormalities of gait and mobility (R26.89)     Time: 8663-8653 PT Time Calculation (min) (ACUTE ONLY): 10 min  Charges:    $Therapeutic Exercise: 8-22 mins $Therapeutic Activity: 8-22 mins PT General Charges $$ ACUTE PT VISIT: 1 Visit                     [facility] PT Acute Rehabilitation Services Office [phone]    [place] ORN [facility] 05/25/2024, 4:59 PM

## 2024-05-25 NOTE — Progress Notes (Signed)
 Speech Language Pathology Treatment: Dysphagia  Patient Details Name: Thomas Ferrell MRN: [id number] DOB: December 18, 1940 Today's Date: 05/25/2024 Time: 9049-8984 SLP Time Calculation (min) (ACUTE ONLY): 25 min  Assessment / Plan / Recommendation Clinical Impression  Pt rejects Provale cup, says its useless and a waste. Many pts find a Provale cup very frustrating to use. Did not attempt to coerce the pt into use. Only reinforced the basic findings of this hospitalization as pts memory and awareness are significantly impaired. Pt repeats questions and is interested in the purpose of tasks, but cannot carry over learning. SLP focused on benefits of small single sips today with reminders before each sip and verbal feedback on pts success (though difficult to judge subjectively). Signs of dysphagia and aspiration still observed, though decreased with small cup sips. Pt much more symptomatic with a straw and signs posted to avoid straws. Pt to keep cup, perhaps a consistent SNF therapist and family can coax use as needed. For now, continue small sips of thin liquids, following bites with sips, dys 2 diet. Will f/u as needed to reinforce.   HPI HPI: Pt is an 83 y/o male who presents to [facility] 05/18/24 post ground level fall. CXR shows Progressive superimposed left lower lobe airspace disease is consistent with pneumonia or aspiration pneumonitis. Pt has been see in prior visits for chronic coughing. Has not had any instrumental testing. PMH significant for PAD, carotid artery stenosis, HTN, prior history of tobacco abuse, stroke/TIA, COPD, anemia. Recent hospitalization 5/25 for SOB and COPD. MBS completed on 05/21/24 with significant esophageal rentention of liquids and solids. Silent aspiration of thins, nectar-thick, and honey-thick residue vs puree. ST f/u for diet tolerance/advancement of food/liquids with use of Provale cup.      SLP Plan  Continue with current plan of care          Recommendations   Diet recommendations: Thin liquid;Dysphagia 2 (fine chop) Liquids provided via: Cup;No straw Medication Administration: Whole meds with puree Supervision: Patient able to self feed;Intermittent supervision to cue for compensatory strategies Compensations: Slow rate;Small sips/bites;Multiple dry swallows after each bite/sip;Follow solids with liquid;Clear throat intermittently;Effortful swallow;Other (Comment) Postural Changes and/or Swallow Maneuvers: Seated upright 90 degrees;Upright 30-60 min after meal                  Oral care BID   Frequent or constant Supervision/Assistance Dysphagia, pharyngoesophageal phase (R13.14)     Continue with current plan of care     Navia Lindahl, Consuelo Fitch  05/25/2024, 10:49 AM

## 2024-05-25 NOTE — Plan of Care (Signed)
   Problem: Activity: Goal: Risk for activity intolerance will decrease Outcome: Progressing   Problem: Coping: Goal: Level of anxiety will decrease Outcome: Progressing   Problem: Safety: Goal: Ability to remain free from injury will improve Outcome: Progressing

## 2024-05-25 NOTE — Progress Notes (Signed)
 PROGRESS NOTE    Thomas Ferrell  FMW:[id number] DOB: [birth year] DOA: 05/18/2024 PCP: [name], MD  Chief Complaint  Patient presents with   Fall    Brief Narrative:   Thomas Ferrell is an 83 y.o. male past medical history significant for peripheral arterial disease, carotid stenosis status post bilateral common femoral endarterectomy and bypass, essential hypertension, history of TIA, paroxysmal atrial fibrillation on Eliquis  COPD on 2 L of oxygen  at home per patient's report failure to thrive he called EMS as he could not get get up from the floor he did not hit his head.  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  Currently on dysphagia 1 diet.    Leukocytosis  Sharp increase in white count in past 48 hrs - ddx includes related to recurrent aspiration pneumonia vs related to his new diarrhea.  C diff should be considered, no abdominal discomfort, but new diarrhea over past 24 hrs.  Will review with our c diff consulting physician   Acute Hypoxic Respiratory Failure secondary to Pneumonia Reportedly on 2 L chronically - need to clarify this with his wife - > he's not on any oxygen  at baseline  remains on 3 L CXR pending 7/14 Completed 5 days abx.  With worsening leukocytosis, will start unasyn .   Urine strep, legionella, MRSA PCR, sputum cx - these weren't collected - will d/c as likely low yield with suspicion for aspiration ' Blood cx NGTD SLP eval with concern for aspiration - suspect this is cause of his presentation  Elevated BNP   Concern for Volume Overload Clinically, doesn't appear overloaded, but BNP elevated from prior and worsening hypoxia with abnormal CXR Repeat echo with grade II diastolic dysfunction, EF 60-65%, mild AV stenosis, IVC normal with greater than 50% resp variability He's tolerated diuresis Will hold further diuresis for now, low threshold to resume  Dysphagia  Concern for Aspiration MBS 7/10, concern for poor esophageal clearance of liquids and solids GI has been consulted -> plan for EGD today -> with abnormal esophageal motility, 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   SLP recommending dysphagia 1, thins at this time  Appreciate palliative conversations  Acute Metabolic Encephalopathy Concern for Cognitive Deficit Per EDP note, hallucinating prior to admission Still confused, will work on better picture of baseline mental status - some memory issues the last few months.  Issues with short term memory recently, getting progressively worse.  He's been combative here, impulsive.   Neurology follow up outpatient - trial trazodone  for sleep Delirium precautions Ammonia wnl, TSH wnl, RPR pending.  B12 elevated.  VBG without hypercarbia.   Paroxysmal atrial fibrillation on Eliquis   RVR: RVR 7/11-12 overnight, metop (had been held earlier due to bradycardia) Eliquis  on hold with his large hematoma below  Avoid NSAIDs   Hypokalemia Replace and follow   Iron Deficiency Anemia Check Nathanial Arrighi B12 (elevated) and folate (wnl) Iron is low trend   Physical deconditioning/ Fall: PT OT consulted.  CT of  the head showed old infarcts.   Right Forearm Hematoma 9.4 x 4.4 x 2.7 cm hematoma in subcutaneous tissues of proximal dorsal forearm - seems to be improving per discussion with his wife - relatively stable today again - per wife, has been this size for Tavonna Worthington while now Discussed with ortho, Dr. Dozier on  7/13 - as asymptomatic, not causing issues, recommending conservative management rather than evacuation.   Monitor off anticoagulation   Essential hypertension: Irbesartan , metoprolol , spironolactone    Moderate Malnutrition  Noted, RD c/s Body mass index is 17.94 kg/m.  Dyslipidemia Hold statin for now  Sacral decubitus ulcer present on admission unstageable    DVT prophylaxis: SCD Code Status: DNR  Family Communication: wife 7/13  Disposition:   Status is: Inpatient Remains inpatient appropriate because: need for ongoing inpatient care   Consultants:  none  Procedures:  none  Antimicrobials:  Anti-infectives (From admission, onward)    Start     Dose/Rate Route Frequency Ordered Stop   05/19/24 1015  doxycycline  (VIBRA -TABS) tablet 100 mg        100 mg Oral Every 12 hours 05/19/24 0916 05/22/24 2109   05/19/24 0800  cefTRIAXone  (ROCEPHIN ) 1 g in sodium chloride  0.9 % 100 mL IVPB        1 g 200 mL/hr over 30 Minutes Intravenous Every 24 hours 05/18/24 1349 05/22/24 0812   05/18/24 1445  azithromycin  (ZITHROMAX ) tablet 500 mg  Status:  Discontinued        500 mg Oral Daily 05/18/24 1349 05/19/24 0916   05/18/24 0615  vancomycin  (VANCOCIN ) IVPB 1000 mg/200 mL premix        1,000 mg 200 mL/hr over 60 Minutes Intravenous  Once 05/18/24 0608 05/18/24 0733   05/18/24 0615  ceFEPIme  (MAXIPIME ) 2 g in sodium chloride  0.9 % 100 mL IVPB        2 g 200 mL/hr over 30 Minutes Intravenous  Once 05/18/24 0608 05/18/24 0656       Subjective: No complaints Denies abdominal pain   Objective: Vitals:   05/24/24 2200 05/25/24 0009 05/25/24 0453 05/25/24 0759  BP: (!) 156/60 132/62 (!) 131/47 (!) 126/54  Pulse: 74 79 67 65  Resp: 19 20 19 18   Temp: (!) 97.5 F (36.4 C) 98.2 F (36.8 C) (!) 97.5 F (36.4 C) 98.2 F (36.8 C)  TempSrc: Oral Oral Oral Oral  SpO2: (!) 86% 98% 93% 100%  Weight:   47.4 kg   Height:        Intake/Output Summary (Last 24 hours) at  05/25/2024 0817 Last data filed at 05/24/2024 2100 Gross per 24 hour  Intake 418 ml  Output --  Net 418 ml   Filed Weights   05/23/24 0100 05/24/24 0732 05/25/24 0453  Weight: 49.9 kg 49.2 kg 47.4 kg    Examination:  General: No acute distress. Cardiovascular: RRR Lungs: diminished, unlabored on 3 L  Abdomen: Soft, nontender, nondistended Neurological: Alert. Moves all extremities 4 with equal strength. Cranial nerves II through XII grossly intact. Skin: Warm and dry. No rashes or lesions. Extremities: relatively stable R arm hematoma, no LE edema  Data Reviewed: I have personally reviewed following labs and imaging studies  CBC: Recent Labs  Lab 05/20/24 0819 05/21/24 0028 05/22/24 0251 05/23/24 0320 05/24/24 0321 05/25/24 0318  WBC 11.6* 15.7* 11.6* 9.2 15.1* 20.3*  NEUTROABS 10.8* 13.8* 10.1* 7.6 13.0*  --   HGB 8.8* 9.7* 9.6* 9.3* 10.4* 11.0*  HCT 27.1* 29.8* 29.9* 29.3* 33.1* 36.0*  MCV 99.3 99.7 99.0 100.3* 100.6* 101.7*  PLT 244 300 292 260 298 294    Basic Metabolic Panel: Recent Labs  Lab 05/21/24 0028 05/22/24 0251 05/23/24 0320 05/24/24 0321 05/25/24 0318  NA 138 141 137 139 136  K 4.2 3.6 3.0* 4.2 4.5  CL 102 99 99 102 99  CO2 22 30 24 26 25   GLUCOSE 130* 111* 99 110* 99  BUN 13 15 18 21 21   CREATININE 0.74 0.77 0.69 0.81 0.76  CALCIUM  8.5* 8.5* 8.2* 8.9 8.9  MG 1.1* 2.1 1.8 2.0 1.9  PHOS 2.5 3.4 3.6 3.6 3.5    GFR: Estimated Creatinine Clearance: 48.6 mL/min (by C-G formula based on SCr of 0.76 mg/dL).  Liver Function Tests: Recent Labs  Lab 05/20/24 0819 05/21/24 0028 05/22/24 0251 05/23/24 0320 05/24/24 0321  AST 43* 53* 49* 54* 40  ALT 33 41 47* 53* 47*  ALKPHOS 63 65 74 69 82  BILITOT 1.0 0.9 1.1 1.0 1.0  PROT 5.3* 6.1* 5.9* 5.6* 5.8*  ALBUMIN 2.5* 2.9* 2.7* 2.5* 2.7*    CBG: No results for input(s): GLUCAP in the last 168 hours.   Recent Results (from the past 240 hours)  Blood Culture (routine x 2)     Status:  None   Collection Time: 05/18/24  1:21 AM   Specimen: BLOOD  Result Value Ref Range Status   Specimen Description BLOOD LEFT ARM  Final   Special Requests   Final    BOTTLES DRAWN AEROBIC AND ANAEROBIC Blood Culture adequate volume   Culture   Final    NO GROWTH 5 DAYS Performed at [facility], [address], [place], [place] [address]    Report Status 05/23/2024 FINAL  Final  Blood Culture (routine x 2)     Status: None   Collection Time: 05/18/24  1:26 AM   Specimen: BLOOD RIGHT HAND  Result Value Ref Range Status   Specimen Description BLOOD RIGHT HAND  Final   Special Requests   Final    BOTTLES DRAWN AEROBIC AND ANAEROBIC Blood Culture adequate volume   Culture   Final    NO GROWTH 5 DAYS Performed at [facility], [address], [place], [place] [address]    Report Status 05/23/2024 FINAL  Final         Radiology Studies: No results found.       Scheduled Meds:  feeding supplement  237 mL Oral BID BM   fluticasone  furoate-vilanterol  1 puff Inhalation Daily   irbesartan   150 mg Oral Daily   metoprolol  tartrate  12.5 mg Oral BID   pantoprazole   40 mg Oral Q0600   sertraline   75 mg Oral Daily   spironolactone   25 mg Oral Daily   traZODone   50 mg Oral QHS   Continuous Infusions:     LOS: 6 days    Time spent: over 30 min    Meliton Monte, MD [facility]   To contact the attending provider between 7A-7P or the covering provider during after hours 7P-7A, please log into the web site [email or web address] and access using universal [place] password for that web site. If you do not have the password, please call the hospital operator.  05/25/2024, 8:17 AM

## 2024-05-25 NOTE — Plan of Care (Signed)
  Problem: Activity: Goal: Risk for activity intolerance will decrease Outcome: Progressing   Problem: Coping: Goal: Level of anxiety will decrease Outcome: Progressing   Problem: Safety: Goal: Ability to remain free from injury will improve 05/25/2024 0605 by Edith Darice BROCKS, RN Outcome: Progressing 05/25/2024 0558 by Edith Darice BROCKS, RN Outcome: Progressing

## 2024-05-25 NOTE — Progress Notes (Signed)
 PT Cancellation Note  Patient Details Name: Thomas Ferrell MRN: [id number] DOB: [birth year]   Cancelled Treatment:    Reason Eval/Treat Not Completed: Patient declined, no reason specified (pt stating fatigue and that he just ate and won't get up right now.)   [name] [name] 05/25/2024, 10:11 AM Lenoard SQUIBB, PT Acute Rehabilitation Services Office: [phone]

## 2024-05-25 NOTE — Progress Notes (Signed)
 Nutrition Follow-up  DOCUMENTATION CODES:   Non-severe (moderate) malnutrition in context of chronic illness (COPD, atrial fibrilation, decreased functional status related to illness)  INTERVENTION:  -Continue regular menu, dysphagia 2 texture, thin liquids per SLP -Increase Ensure Plus High Protein to TID to promote adequate PO intake -Add MVI w/ min  -Staff to continue to encourage swallow safety recs per SLP -Mg, Phos continue to be drawn daily x 1 day for high refeeding risk   NUTRITION DIAGNOSIS:   Moderate Malnutrition related to chronic illness (COPD, atrial fibrilation, decreased functional status related to illness) as evidenced by moderate muscle depletion, moderate fat depletion, energy intake < 75% for > or equal to 1 month.  -Continue to be appropriate  GOAL:   Patient will meet greater than or equal to 90% of their needs  -Progressing  MONITOR:   PO intake, Supplement acceptance  REASON FOR ASSESSMENT:   Consult Assessment of nutrition requirement/status, Calorie Count  ASSESSMENT:   Pt with hx HTN, prior CVA, COPD, PAD, and atrial fibrillation. Hx of bilateral femoral bypass graft (2015), cholecystectomy (2017), and peripheral vascular balloon angioplasty (2024). Pt brought in by EMS after fall and admitted with FTT acute on chronic respiratory failure w/ pna.  Attempted to speak to pt at bedside. Pt asleep during time of visit, attempt to rouse; pt opened eyes and then quickly closed them again and did not respond. Pt recently upgraded to regular menu, dysphagia 2 texture, thin liquids w/ Provale cup. Pt continues to be an aspiration risk, though, decision to continue with PO intake. No noted n/v/c or chewing difficulties. Pt with liquid stools, Loperamide  ordered early this morning; notably pt is on abx as well. Documented meal intake 43% x 3 days, which is improved from previous intake. Weight has decreased per weight records, -21 lbs (16.8%) x ~ 2 weeks,  which would be severe; however, question accuracy of weights. Overall, pt has had a undesirable downtrend to his weight per records. Current BMI 17.94 is well below goal range for geriatric parameters. Pt does have Stage 1 to coccyx, as well as a wound on his penis. Increase ONS EPHP to TID, add MVI w/ min to promote adequate intake, wound healing. Pt d/c plan SNF, auth approved; per MD not medically ready today. Will continue to monitor, RDN available prn.   Labs Ionized Ca 1.05 Albumin 2.7 ALT 47 Iron 17 H/H 11/36  Medications Unasyn  Loperamide  Metoprolol  Protonix  Sertraline  Spironolactone  Trazodone    Diet Order:   Diet Order             DIET DYS 2 Fluid consistency: Thin  Diet effective now                   EDUCATION NEEDS:   Education needs have been addressed  Skin:  Skin Assessment: Skin Integrity Issues: Skin Integrity Issues:: Stage I, Other (Comment) Stage I: Coccyx Other: Penis wound  Last BM:  7/14 Type 7  Height:   Ht Readings from Last 1 Encounters:  05/22/24 5' 4 (1.626 m)    Weight:   Wt Readings from Last 1 Encounters:  05/25/24 47.4 kg    Ideal Body Weight:  59 kg  BMI:  Body mass index is 17.94 kg/m.  Estimated Nutritional Needs:   Kcal:  1500-1700  Protein:  65-80g  Fluid:  1.5-1.7L   Camila Norville Daml-Budig, RDN, LDN Registered Dietitian Nutritionist RD Inpatient Contact Info in [place]

## 2024-05-25 NOTE — TOC Progression Note (Addendum)
 Transition of Care [facility]) - Progression Note    Patient Details  Name: Thomas Ferrell MRN: [id number] Date of Birth: [birth year]  Transition of Care [facility] CM/SW Contact  Thomas Ferrell, [place] Phone Number: 05/25/2024, 8:38 AM  Clinical Narrative:   Shara approval dates 05/25/2024-05/27/2024; shara id [id number]. CSW notified treatment team.   9:20AM Per daily meeting with treatment team, patient is not MR. Facility updated.    11:21 AM CSW spoke with Thomas Ferrell  (Spouse, [phone]) about insurance auth approval. CSW asked Thomas Ferrell her preferences of [facility] or [facility] for palliative services. Thomas Ferrell chose [facility]. [facility] has been notified at this time.   TOC will continue to follow.     Expected Discharge Plan: Skilled Nursing Facility Barriers to Discharge: Continued Medical Work up, SNF Pending bed offer  Expected Discharge Plan and Services In-house Referral: Clinical Social Work   Post Acute Care Choice: Skilled Nursing Facility (per wife) Living arrangements for the past 2 months: Single Family Home                                       Social Determinants of Health (SDOH) Interventions SDOH Screenings   Food Insecurity: No Food Insecurity (05/18/2024)  Housing: Low Risk  (05/18/2024)  Transportation Needs: No Transportation Needs (05/18/2024)  Utilities: Not At Risk (05/18/2024)  Financial Resource Strain: Low Risk  (04/28/2024)  Social Connections: Patient Unable To Answer (05/18/2024)  Recent Concern: Social Connections - Moderately Isolated (03/18/2024)  Tobacco Use: Medium Risk (05/22/2024)  Health Literacy: Adequate Health Literacy (04/28/2024)    Readmission Risk Interventions    03/19/2024    1:30 PM  Readmission Risk Prevention Plan  Post Dischage Appt Complete  Medication Screening Complete  Transportation Screening Complete

## 2024-05-26 DIAGNOSIS — I7025 Atherosclerosis of native arteries of other extremities with ulceration: Secondary | ICD-10-CM | POA: Diagnosis not present

## 2024-05-26 DIAGNOSIS — J9601 Acute respiratory failure with hypoxia: Secondary | ICD-10-CM | POA: Diagnosis not present

## 2024-05-26 DIAGNOSIS — K219 Gastro-esophageal reflux disease without esophagitis: Secondary | ICD-10-CM | POA: Diagnosis not present

## 2024-05-26 DIAGNOSIS — G629 Polyneuropathy, unspecified: Secondary | ICD-10-CM | POA: Diagnosis not present

## 2024-05-26 DIAGNOSIS — E440 Moderate protein-calorie malnutrition: Secondary | ICD-10-CM | POA: Diagnosis not present

## 2024-05-26 DIAGNOSIS — E785 Hyperlipidemia, unspecified: Secondary | ICD-10-CM | POA: Diagnosis not present

## 2024-05-26 DIAGNOSIS — L03114 Cellulitis of left upper limb: Secondary | ICD-10-CM | POA: Diagnosis not present

## 2024-05-26 DIAGNOSIS — M545 Low back pain, unspecified: Secondary | ICD-10-CM | POA: Diagnosis not present

## 2024-05-26 DIAGNOSIS — I2510 Atherosclerotic heart disease of native coronary artery without angina pectoris: Secondary | ICD-10-CM | POA: Diagnosis not present

## 2024-05-26 DIAGNOSIS — I700 Atherosclerosis of aorta: Secondary | ICD-10-CM | POA: Diagnosis not present

## 2024-05-26 DIAGNOSIS — I5032 Chronic diastolic (congestive) heart failure: Secondary | ICD-10-CM | POA: Diagnosis not present

## 2024-05-26 DIAGNOSIS — Z7189 Other specified counseling: Secondary | ICD-10-CM | POA: Diagnosis not present

## 2024-05-26 DIAGNOSIS — I10 Essential (primary) hypertension: Secondary | ICD-10-CM | POA: Diagnosis not present

## 2024-05-26 DIAGNOSIS — J449 Chronic obstructive pulmonary disease, unspecified: Secondary | ICD-10-CM | POA: Diagnosis not present

## 2024-05-26 DIAGNOSIS — Z515 Encounter for palliative care: Secondary | ICD-10-CM | POA: Diagnosis not present

## 2024-05-26 DIAGNOSIS — R2689 Other abnormalities of gait and mobility: Secondary | ICD-10-CM | POA: Diagnosis not present

## 2024-05-26 DIAGNOSIS — Z741 Need for assistance with personal care: Secondary | ICD-10-CM | POA: Diagnosis not present

## 2024-05-26 DIAGNOSIS — M6281 Muscle weakness (generalized): Secondary | ICD-10-CM | POA: Diagnosis not present

## 2024-05-26 DIAGNOSIS — I739 Peripheral vascular disease, unspecified: Secondary | ICD-10-CM | POA: Diagnosis not present

## 2024-05-26 DIAGNOSIS — R1313 Dysphagia, pharyngeal phase: Secondary | ICD-10-CM | POA: Diagnosis not present

## 2024-05-26 DIAGNOSIS — D649 Anemia, unspecified: Secondary | ICD-10-CM | POA: Diagnosis not present

## 2024-05-26 DIAGNOSIS — R627 Adult failure to thrive: Secondary | ICD-10-CM | POA: Diagnosis not present

## 2024-05-26 DIAGNOSIS — I4891 Unspecified atrial fibrillation: Secondary | ICD-10-CM | POA: Diagnosis not present

## 2024-05-26 DIAGNOSIS — M6259 Muscle wasting and atrophy, not elsewhere classified, multiple sites: Secondary | ICD-10-CM | POA: Diagnosis not present

## 2024-05-26 DIAGNOSIS — Z9181 History of falling: Secondary | ICD-10-CM | POA: Diagnosis not present

## 2024-05-26 LAB — CBC WITH DIFFERENTIAL/PLATELET
Abs Immature Granulocytes: 0.16 K/uL — ABNORMAL HIGH (ref 0.00–0.07)
Basophils Absolute: 0 K/uL (ref 0.0–0.1)
Basophils Relative: 0 %
Eosinophils Absolute: 0.4 K/uL (ref 0.0–0.5)
Eosinophils Relative: 3 %
HCT: 29.8 % — ABNORMAL LOW (ref 39.0–52.0)
Hemoglobin: 9.2 g/dL — ABNORMAL LOW (ref 13.0–17.0)
Immature Granulocytes: 1 %
Lymphocytes Relative: 6 %
Lymphs Abs: 0.9 K/uL (ref 0.7–4.0)
MCH: 31.4 pg (ref 26.0–34.0)
MCHC: 30.9 g/dL (ref 30.0–36.0)
MCV: 101.7 fL — ABNORMAL HIGH (ref 80.0–100.0)
Monocytes Absolute: 0.7 K/uL (ref 0.1–1.0)
Monocytes Relative: 5 %
Neutro Abs: 11.8 K/uL — ABNORMAL HIGH (ref 1.7–7.7)
Neutrophils Relative %: 85 %
Platelets: 251 K/uL (ref 150–400)
RBC: 2.93 MIL/uL — ABNORMAL LOW (ref 4.22–5.81)
RDW: 16 % — ABNORMAL HIGH (ref 11.5–15.5)
WBC: 14 K/uL — ABNORMAL HIGH (ref 4.0–10.5)
nRBC: 0 % (ref 0.0–0.2)

## 2024-05-26 LAB — COMPREHENSIVE METABOLIC PANEL WITH GFR
ALT: 33 U/L (ref 0–44)
AST: 25 U/L (ref 15–41)
Albumin: 2.4 g/dL — ABNORMAL LOW (ref 3.5–5.0)
Alkaline Phosphatase: 69 U/L (ref 38–126)
Anion gap: 8 (ref 5–15)
BUN: 26 mg/dL — ABNORMAL HIGH (ref 8–23)
CO2: 26 mmol/L (ref 22–32)
Calcium: 8.6 mg/dL — ABNORMAL LOW (ref 8.9–10.3)
Chloride: 102 mmol/L (ref 98–111)
Creatinine, Ser: 0.8 mg/dL (ref 0.61–1.24)
GFR, Estimated: >60 mL/min (ref >60)
Glucose, Bld: 111 mg/dL — ABNORMAL HIGH (ref 70–99)
Potassium: 4.5 mmol/L (ref 3.5–5.1)
Sodium: 136 mmol/L (ref 135–145)
Total Bilirubin: 0.7 mg/dL (ref 0.0–1.2)
Total Protein: 5.2 g/dL — ABNORMAL LOW (ref 6.5–8.1)

## 2024-05-26 LAB — PHOSPHORUS: Phosphorus: 4 mg/dL (ref 2.5–4.6)

## 2024-05-26 LAB — MAGNESIUM: Magnesium: 1.8 mg/dL (ref 1.7–2.4)

## 2024-05-26 MED ORDER — IRBESARTAN 150 MG PO TABS: 150.0000 mg | ORAL_TABLET | Freq: Every day | ORAL | Status: AC

## 2024-05-26 MED ORDER — AMOXICILLIN-POT CLAVULANATE 875-125 MG PO TABS: 1.0000 | ORAL_TABLET | Freq: Two times a day (BID) | ORAL | Status: AC

## 2024-05-26 NOTE — Progress Notes (Signed)
 Palliative Medicine Inpatient Follow Up Note HPI: Thomas Ferrell is an 83 y.o. male past medical history significant for peripheral arterial disease, carotid stenosis status post bilateral common femoral endarterectomy and bypass, essential hypertension, history of TIA, paroxysmal atrial fibrillation on Eliquis  COPD on 2 L of oxygen  at home per patient's report failure to thrive he called EMS as he could not get get up from the floor he did not hit his head. White blood cell count of 23,000 chest x-ray showed left lower lobe consolidation.   Today's Discussion 05/26/2024  *Please note that this is a verbal dictation therefore any spelling or grammatical errors are due to the Dragon Medical One system interpretation.  Chart reviewed inclusive of vital signs, progress notes, laboratory results, and diagnostic images. WBC increased today.   I met with Miquel at bedside this morning. He shares that he is leaving today and his wife is on her way up. We discussed his present state and the plan for transition to [facility] this late morning.  Happy and I confirmed he does not want rehospitalization. If he should decline at the skilled nursing facility he wants to be made comfort care.  Patients wife arrived at bedside. She shares how overwhelmed she is by all of the declines her husband has had. She shares the difficulty of seeing the person you love most precipitously decline. Allowed her time and space to express her concerns. Discussed the process of grief and how it presents differently for everyone. We reviewed in addition to this she has her own health to be concerned by.   Confirmed the plan for transition [facility] - [facility] [place]   Have asked for [facility] to check in on [place] sooner at [facility] in the event that he continues to decline to make a hospice transition easier.   Questions and concerns addressed/Palliative Support  Provided.   Objective Assessment: Vital Signs Vitals:   05/26/24 0437 05/26/24 0815  BP: (!) 126/51 (!) 140/58  Pulse: 60   Resp: 18 18  Temp: 98.4 F (36.9 C) 97.7 F (36.5 C)  SpO2: 100% 100%    Intake/Output Summary (Last 24 hours) at 05/26/2024 1113 Last data filed at 05/26/2024 0900 Gross per 24 hour  Intake 608.85 ml  Output 276 ml  Net 332.85 ml   Last Weight  Most recent update: 05/26/2024 12:15 AM    Weight  46.7 kg (102 lb 15.3 oz)            Gen: Elderly Caucasian male chronically ill in appearance HEENT: Dry mucous membranes CV: Regular rate and rhythm PULM: 2L nasal cannula breathing is even and nonlabored ABD: soft/nontender EXT: No edema Neuro: Alert and oriented x3  SUMMARY OF RECOMMENDATIONS   DNAR/DNI   Does not want rehospitalization   MOST / DNR Form Completed, paper copy placed onto the chart electric copy can be found in [facility]  [facility] will follow up with Miquel once he discharges   Plan for discharge to [facility] with OP Palliative care ______________________________________________________________________________________ Rosaline Becton [place] Palliative Medicine Team Team Cell Phone: [phone] Please utilize secure chat with additional questions, if there is no response within 30 minutes please call the above phone number  Billing based on MDM: Moderate   Palliative Medicine Team providers are available by phone from 7am to 7pm daily and can be reached through the team cell phone.  Should this patient require assistance outside of  these hours, please call the patient's attending physician.

## 2024-05-26 NOTE — TOC Transition Note (Addendum)
 Transition of Care [facility]) - Discharge Note   Patient Details  Name: Thomas Ferrell MRN: [id number] Date of Birth: [birth year]  Transition of [facility] CM/SW Contact:  Luise JAYSON Pan, LCSWA Phone Number: 05/26/2024, 10:34 AM   Clinical Narrative:   Patient will DC to: [facility]  Anticipated DC date: 05/26/24  Family notified: Carlin (son) Transport by: [facility]   Per MD patient ready for DC to [facility]. RN to call report prior to discharge [phone]; room 208 ). RN, patient, patient's family, and facility notified of DC. Discharge Summary and FL2 sent to facility. DC packet on chart. Ambulance transport requested for patient 10:38 AM.   11:33 AM MD messaged CSW to send updated DC summary to facility. CSW sent updated DC summary at this time.   CSW will sign off for now as social work intervention is no longer needed. Please consult us  again if new needs arise.      Final next level of care: Skilled Nursing Facility Barriers to Discharge: Barriers Resolved   Patient Goals and CMS Choice Patient states their goals for this hospitalization and ongoing recovery are:: go home          Discharge Placement              Patient [facility] at[facility] Patient to be transferred to facility by: [facility] Name of family member notified: Carlin (son) Patient and family notified of of transfer: 05/26/24  Discharge Plan and Services Additional resources added to the After Visit Summary for   In-house Referral: Clinical Social Work   Post Acute Care Choice: Skilled Nursing Facility (per wife)                               Social Drivers of Health (SDOH) Interventions SDOH Screenings   Food Insecurity: No Food Insecurity (05/18/2024)  Housing: Low Risk  (05/18/2024)  Transportation Needs: No Transportation Needs (05/18/2024)  Utilities: Not At Risk (05/18/2024)  Financial Resource Strain: Low Risk  (04/28/2024)  Social Connections: Patient Unable To  Answer (05/18/2024)  Recent Concern: Social Connections - Moderately Isolated (03/18/2024)  Tobacco Use: Medium Risk (05/22/2024)  Health Literacy: Adequate Health Literacy (04/28/2024)     Readmission Risk Interventions    03/19/2024    1:30 PM  Readmission Risk Prevention Plan  Post Dischage Appt Complete  Medication Screening Complete  Transportation Screening Complete

## 2024-05-26 NOTE — Discharge Summary (Addendum)
 Physician Discharge Summary  Thomas Ferrell FMW:[id number] DOB: 08-15-1941 DOA: 05/18/2024  PCP: Thomas Senior, MD  Admit date: 05/18/2024 Discharge date: 05/26/2024  Time spent: 40 minutes  Recommendations for Outpatient Follow-up:  Follow outpatient CBC/CMP  per palliative discussion today 7/15, he does not want rehospitalization.  If he were to decline, would transition to hospice at facility.  See MOST form for details. Continued modified diet per SLP - dysphagia 2, thin liquids Could consider low dose reglan or erythromycin outpatient, he's had prolonged qtc here at times - would repeat EKG first and consider side effects - if he's doing well, could consider deferring this Discharging on antibiotics for aspiration pneumonia - follow symptoms Eliquis  on hold given his R forearm hematoma  Monitor R forearm hematoma, for worsening or symptoms, would recommend orthopedics to reevaluate for drainage Follow CXR outpatient  SLP to follow outpatient Consider neurology follow up outpatient if desired based on goc  Discharge Diagnoses:  Principal Problem:   Fall Active Problems:   CAP (community acquired pneumonia)   FTT (failure to thrive) in adult   PNA (pneumonia)   Pressure injury of skin   Malnutrition of moderate degree   Acute hypoxemic respiratory failure (HCC)  Discharge Condition: stable  Diet recommendation: dysphagia 2, thin liquids  Filed Weights   05/24/24 0732 05/25/24 0453 05/26/24 0013  Weight: 49.2 kg 47.4 kg 46.7 kg    History of present illness:   Thomas Ferrell is an 83 y.o. male past medical history significant for peripheral arterial disease, carotid stenosis status post bilateral common femoral endarterectomy and bypass, essential hypertension, history of TIA, paroxysmal atrial fibrillation on Eliquis  COPD who was admitted after falling out of bed. He was found to have pneumonia and an elevated white blood cell count.  SLP eval demonstrated aspiration  risk.  He had EGD with concern for esophageal dysphagia.  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    Hospitalization has been complicated by episodes of acute metabolic encephalopathy, now improved.  Palliative has been involved, plan is for palliative care to follow outpatient.  He doesn't want rehospitalization.    He completed Thomas Ferrell course of antibiotics for pneumonia.  On the day before discharge, had worsened WBC count which has improved after restarting antibiotics for pneumonia, will discharge with additional 5 days of augmentin .   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  Currently on dysphagia 2 diet.  Discussed with family, concern from prognosis standpoint is risk of recurrent aspiration and downstream consequences, though he's doing well thankfully at this time.  Plan is for palliative to follow outpatient.  MOST form has been completed.  Per my discussion with palliative today, he does not want rehospitalization and recommend transition to hospice if he declines.     Leukocytosis  With improvement on unasyn , suspect this is related to aspiration below Will send with another 5 days No additional diarrhea yesterday, C diff not indicated   Acute Hypoxic Respiratory Failure secondary to Pneumonia he's not on any oxygen  at baseline  remains on 2-3 L CXR with hazy interstitial opacity at the L lung base and peripherally in the left mid lung with lesser simliar findings in the right upper lobe  Complete additional 5 days of augmentin  at discharge (received 5 days abx previously) Negative MRSA PCR.  Urine strep, legionella, sputum cx - these weren't collected -  canceled with low yield in setting of aspiration. Blood cx NGTD SLP eval with concern for aspiration - suspect this is cause of his presentation   Elevated BNP   Concern for Volume Overload Clinically, doesn't appear overloaded, but BNP elevated from prior and worsening hypoxia with abnormal CXR Repeat echo with grade II diastolic dysfunction, EF 60-65%, mild AV stenosis, IVC normal with greater than 50% resp variability He tolerated diuresis, will hold further as he doesn't appear overloaded - suspect worsened resp status related to recurrent aspiration   Dysphagia  Concern for Aspiration MBS 7/10, concern for poor esophageal clearance of liquids and solids GI has been consulted -> plan for EGD today -> with abnormal esophageal motility, 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   Could consider trial of low dose reglan or erythromycin as outpatient, would repeat EKG given his intermittently prolonged Qtc here and consider adverse effects. SLP recommending dysphagia 2, thins at this time  Appreciate palliative conversations   Acute Metabolic Encephalopathy Concern for Cognitive Deficit Per EDP note, hallucinating prior to admission Still confused, will work on better picture of baseline mental status - some memory issues the last few months.  Issues with short term memory recently, getting progressively worse.  He's been combative here, impulsive.   Neurology follow up outpatient pending goals of care - trial trazodone  for sleep Delirium precautions Ammonia wnl, TSH wnl, RPR pending.  B12 elevated.  VBG without hypercarbia.    Paroxysmal atrial fibrillation on Eliquis   RVR: RVR 7/11-12 overnight, metop (had been held earlier due to bradycardia) Eliquis  on hold with his large hematoma below  Avoid NSAIDs   Hypokalemia Replace and follow    Iron Deficiency Anemia Check Thomas Ferrell B12 (elevated) and folate (wnl) Iron is low trend   Physical deconditioning/ Fall: PT OT consulted.  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   Right Forearm Hematoma 9.4 x 4.4 x  2.7 cm hematoma in subcutaneous tissues of proximal dorsal forearm - seems to be improving per discussion with his wife - relatively stable today again - per wife, has been this size for Thomas Ferrell while now Discussed with ortho, Dr. Dozier on 7/13 - as asymptomatic, not causing issues, recommending conservative management rather than evacuation.   Monitor off anticoagulation If issues, would follow with ortho outpatient   Left Elbow Cellulitis Noted by EDP at presentation Mild redness today, no discomfort or limitation to ROM of elbow Can monitor, discharging on abx  Essential hypertension: Irbesartan , metoprolol , spironolactone    Moderate Malnutrition  Noted, RD c/s Body mass index is 17.94 kg/m.   Dyslipidemia statin   Sacral decubitus ulcer present on admission unstageable     Procedures: EGD   Consultations: GI  Discharge Exam: Vitals:   05/26/24 0437 05/26/24 0815  BP: (!) 126/51 (!) 140/58  Pulse: 60   Resp: 18 18  Temp: 98.4 F (36.9 C) 97.7 F (36.5 C)  SpO2: 100% 100%   No complaints Discussed discharge plan with son   General: No acute distress. Chronically ill appearing.  Frail.  Cardiovascular: RRR Lungs: unlabored, crackles at bases Neurological: Alert . Moves all extremities 4 . Cranial nerves II through XII grossly intact. Extremities: right forearm with large hematoma, relatively stable - L elbow with mild redness, no limited ROM   Discharge Instructions   Discharge Instructions     Call MD for:  difficulty breathing, headache or visual disturbances  Complete by: As directed    Call MD for:  extreme fatigue   Complete by: As directed    Call MD for:  hives   Complete by: As directed    Call MD for:  persistant dizziness or light-headedness   Complete by: As directed    Call MD for:  persistant nausea and vomiting   Complete by: As directed    Call MD for:  redness, tenderness, or signs of infection (pain, swelling, redness, odor or  green/yellow discharge around incision site)   Complete by: As directed    Call MD for:  severe uncontrolled pain   Complete by: As directed    Call MD for:  temperature >100.4   Complete by: As directed    DIET DYS 2   Complete by: As directed    Cup;No straw Medication Administration: Whole meds with puree Supervision: Patient able to self feed;Intermittent supervision to cue for compensatory strategies Slow rate Small sips/bites Multiple dry swallows after each bite/sip Follow solids with liquid Clear throat intermittently Effortful swallow Seated upright 90 degrees Upright 30-60 min after meal   Fluid consistency: Thin   Diet - low sodium heart healthy   Complete by: As directed    Discharge instructions   Complete by: As directed    You were seen after Akesha Uresti fall.  You were found to have pneumonia.  After the evaluation by speech therapy, I believe your pneumonia is due to recurrent aspiration.  You had Jillianna Stanek modified barium swallow that showed silent aspiration events.  You had an EGD which showed findings concerning for presbyesophagus (abnormal esophageal motility associated with age).  We've modified your diet.  You're on Shantoria Ellwood dysphagia 2 diet (fine chop) with thin liquids.  You should take medicines with puree.  Use the compensation methods described below to help reduce the risk of aspiration.  We can't eliminate your risk of aspiration, so you're at an ongoing risk of aspiration, but you've been doing ok the last few days and my hope is that continues.  One thing that can be considered outpatient is Kylle Lall promotility agent like reglan, but I'd recommend repeat EKG before this is considered as well as consideration of side effects.  I'm sending you with 5 days of antibiotics to treat recurrent aspiration.  I've stopped your eliquis  and aspirin .  You have Kaid Seeberger large hematoma on your right arm that should be monitored.  As it's not currently causing issues, we can watch it, but if it worsens  or causes pain or concerning symptoms, you should be seen by orthopedics to consider drainage.  It sounds like you've had Safiatou Islam cognitive decline in the past few months.  Dementia is Safiyyah Vasconez diagnosis that's made outpatient, but this is Aymee Fomby possibility.  If you desire to evaluate this further, you can follow up with neurology outpatient.  Continue to follow with palliative care as an outpatient.  Return for new, recurrent, or worsening symptoms.  Please ask your PCP to request records from this hospitalization so they know what was done and what the next steps will be.   Increase activity slowly   Complete by: As directed    No wound care   Complete by: As directed       Allergies as of 05/26/2024       Reactions   Hydrocodone  Other (See Comments)   hallucinations   Morphine  And Codeine Itching, Other (See Comments)   Hallucinations         Medication  List     STOP taking these medications    apixaban  2.5 MG Tabs tablet Commonly known as: ELIQUIS    aspirin  81 MG chewable tablet   ibuprofen 200 MG tablet Commonly known as: ADVIL   potassium chloride  10 MEQ tablet Commonly known as: KLOR-CON        TAKE these medications    acetaminophen  650 MG CR tablet Commonly known as: TYLENOL  Take 650 mg by mouth daily.   amoxicillin -clavulanate 875-125 MG tablet Commonly known as: AUGMENTIN  Take 1 tablet by mouth 2 (two) times daily for 5 days.   atorvastatin  20 MG tablet Commonly known as: LIPITOR Take 20 mg by mouth in the morning.   bismuth subsalicylate 262 MG chewable tablet Commonly known as: PEPTO BISMOL Chew 262 mg by mouth as needed for indigestion or diarrhea or loose stools.   Dulera  200-5 MCG/ACT Aero Generic drug: mometasone -formoterol  Inhale 2 puffs into the lungs 2 (two) times daily.   irbesartan  150 MG tablet Commonly known as: Avapro  Take 1 tablet (150 mg total) by mouth daily. What changed:  additional instructions Another medication with the same name  was removed. Continue taking this medication, and follow the directions you see here.   ketotifen  0.035 % ophthalmic solution Commonly known as: ZADITOR  Place 1 drop into both eyes daily as needed (Dry eye).   levalbuterol  45 MCG/ACT inhaler Commonly known as: XOPENEX  HFA Inhale 2 puffs into the lungs in the morning, at noon, and at bedtime for 4 days, THEN 2 puffs every 6 (six) hours as needed for wheezing. Start taking on: Mar 19, 2024   metoprolol  tartrate 25 MG tablet Commonly known as: LOPRESSOR  Take 0.5 tablets (12.5 mg total) by mouth 2 (two) times daily.   pantoprazole  40 MG tablet Commonly known as: PROTONIX  Take 1 tablet (40 mg total) by mouth daily at 6 (six) AM.   sertraline  50 MG tablet Commonly known as: ZOLOFT  Take 75 mg by mouth daily.   spironolactone  25 MG tablet Commonly known as: ALDACTONE  Take 1 tablet (25 mg total) by mouth daily.   vitamin B-12 500 MCG tablet Commonly known as: CYANOCOBALAMIN  Take 500 mcg by mouth in the morning.       Allergies  Allergen Reactions   Hydrocodone  Other (See Comments)    hallucinations   Morphine  And Codeine Itching and Other (See Comments)    Hallucinations     Contact information for after-discharge care     Destination     [facility] [place], [place] .   Service: Skilled Nursing Contact information: [address] [place]  [phone]                      The results of significant diagnostics from this hospitalization (including imaging, microbiology, ancillary and laboratory) are listed below for reference.    Significant Diagnostic Studies: DG CHEST PORT 1 VIEW Result Date: 05/25/2024 CLINICAL DATA:  Pneumonia EXAM: PORTABLE CHEST 1 VIEW COMPARISON:  05/23/2024 FINDINGS: Hazy primarily confluent interstitial opacity at the left lung base and peripherally in the left mid lung is again observed, with lesser similar findings peripherally in the right upper lobe.  Mild elevation of the left hemidiaphragm. Dense calcification of the wall of the aortic arch and descending thoracic aorta. Heart size within normal limits. Axillary arterial and left common carotid atheromatous vascular calcifications. Overall the appearance is not substantially changed from 05/23/2024. IMPRESSION: 1. Hazy primarily confluent interstitial opacity at the left lung base and peripherally  in the left mid lung, with lesser similar findings peripherally in the right upper lobe. This is not substantially changed from 05/23/2024. 2. Mild elevation of the left hemidiaphragm. 3. Aortic Atherosclerosis (ICD10-I70.0). Electronically Signed   By: Ryan Salvage M.D.   On: 05/25/2024 09:51   LONG TERM MONITOR (3-14 DAYS) Result Date: 05/23/2024   Patient had Taevyn Hausen minimum heart rate of 48 bpm, maximum heart rate of 207 bpm, and average heart rate of 79 bpm. Predominant underlying rhythm was sinus rhythm. Frequent SVT, longest lasting 15 seconds. Isolated PACs were frequent (14%). Isolated PVCs were occasional (4%). Frequent PACs and SVT.  DG Chest Port 1 View Result Date: 05/23/2024 EXAM: 1 VIEW XRAY OF THE CHEST 05/23/2024 07:09:19 AM COMPARISON: 1 view chest x-ray 03/20/2024 and 1-view chest x-ray 05/09/2024. CLINICAL HISTORY: Atrial fibrillation with RVR (HCC) [id number]. Sadiel Mota-fib with RVR; hx of fall. FINDINGS: LUNGS AND PLEURA: Progressive superimposed airspace disease is present in the left lung. Chronic interstitial disease is again noted. No pleural effusion. No pneumothorax. HEART AND MEDIASTINUM: Atherosclerotic changes are again noted at the aortic arch. No acute abnormality of the cardiac and mediastinal silhouettes. BONES AND SOFT TISSUES: Degenerative changes are present. No acute osseous abnormality. IMPRESSION: 1. Progressive superimposed airspace disease in the left lung. 2. Chronic interstitial disease. Electronically signed by: Lonni Necessary MD 05/23/2024 07:22 AM EDT RP Workstation:  [id number]   ECHOCARDIOGRAM LIMITED Result Date: 05/21/2024    ECHOCARDIOGRAM LIMITED REPORT   Patient Name:   TAJEE SAVANT Date of Exam: 05/21/2024 Medical Rec #:  [id number]      Height:       64.0 in Accession #:    [id number]     Weight:       105.6 lb Date of Birth:  [birth year]     BSA:          1.491 m Patient Age:    82 years       BP:           184/69 mmHg Patient Gender: M              HR:           91 bpm. Exam Location:  Inpatient Procedure: Limited Color Doppler and Cardiac Doppler (Both Spectral and Color            Flow Doppler were utilized during procedure). Indications:    Other abnormalities of the heart  History:        Patient has prior history of Echocardiogram examinations, most                 recent 03/18/2024. Pneumonia.  Sonographer:    Benard Stallion Referring Phys: [phone] Lila Lufkin CALDWELL POWELL JR IMPRESSIONS  1. Left ventricular ejection fraction, by estimation, is 60 to 65%. The left ventricle has normal function. Left ventricular diastolic parameters are consistent with Grade II diastolic dysfunction (pseudonormalization).  2. Left atrial size was mildly dilated.  3. The mitral valve is normal in structure. Mild mitral valve regurgitation.  4. There is moderate calcification of the aortic valve. Mild aortic valve stenosis. Aortic valve area, by VTI measures 1.45 cm. Aortic valve mean gradient measures 10.0 mmHg. Aortic valve Vmax measures 2.12 m/s.  5. The inferior vena cava is normal in size with greater than 50% respiratory variability, suggesting right atrial pressure of 3 mmHg. FINDINGS  Left Ventricle: Left ventricular ejection fraction, by estimation, is 60 to 65%. The left ventricle has normal function. Left ventricular  diastolic parameters are consistent with Grade II diastolic dysfunction (pseudonormalization). Left Atrium: Left atrial size was mildly dilated. Mitral Valve: The mitral valve is normal in structure. Mild mitral valve regurgitation. Tricuspid Valve: The  tricuspid valve is normal in structure. Tricuspid valve regurgitation is trivial. Aortic Valve: There is moderate calcification of the aortic valve. Mild aortic stenosis is present. Aortic valve mean gradient measures 10.0 mmHg. Aortic valve peak gradient measures 18.1 mmHg. Aortic valve area, by VTI measures 1.45 cm. Pulmonic Valve: The pulmonic valve was grossly normal. Pulmonic valve regurgitation is trivial. Venous: The inferior vena cava is normal in size with greater than 50% respiratory variability, suggesting right atrial pressure of 3 mmHg. LEFT VENTRICLE PLAX 2D LVIDd:         4.20 cm   Diastology LVIDs:         2.90 cm   LV e' medial:    7.40 cm/s LV PW:         1.00 cm   LV E/e' medial:  14.2 LV IVS:        1.00 cm   LV e' lateral:   10.10 cm/s LVOT diam:     2.00 cm   LV E/e' lateral: 10.4 LV SV:         62 LV SV Index:   42 LVOT Area:     3.14 cm  RIGHT VENTRICLE RV S prime:     12.90 cm/s TAPSE (M-mode): 2.6 cm LEFT ATRIUM             Index        RIGHT ATRIUM           Index LA diam:        3.50 cm 2.35 cm/m   RA Area:     18.00 cm LA Vol (A2C):   47.2 ml 31.65 ml/m  RA Volume:   50.50 ml  33.86 ml/m LA Vol (A4C):   35.7 ml 23.94 ml/m LA Biplane Vol: 43.8 ml 29.37 ml/m  AORTIC VALVE AV Area (Vmax):    1.40 cm AV Area (Vmean):   1.40 cm AV Area (VTI):     1.45 cm AV Vmax:           212.50 cm/s AV Vmean:          145.250 cm/s AV VTI:            0.427 m AV Peak Grad:      18.1 mmHg AV Mean Grad:      10.0 mmHg LVOT Vmax:         94.90 cm/s LVOT Vmean:        64.900 cm/s LVOT VTI:          0.198 m LVOT/AV VTI ratio: 0.46  AORTA Ao Root diam: 2.90 cm Ao Asc diam:  3.40 cm MITRAL VALVE                TRICUSPID VALVE MV Area (PHT): 3.93 cm     TR Peak grad:   47.1 mmHg MV Decel Time: 193 msec     TR Vmax:        343.00 cm/s MR Peak grad: 92.9 mmHg MR Vmax:      482.00 cm/s   SHUNTS MV E velocity: 105.00 cm/s  Systemic VTI:  0.20 m MV Jull Harral velocity: 86.50 cm/s   Systemic Diam: 2.00 cm MV E/Avalyn Molino ratio:   1.21 Toribio Fuel MD Electronically signed by Toribio Fuel MD Signature Date/Time: 05/21/2024/7:28:54 PM  Final    DG Swallowing Func-Speech Pathology Result Date: 05/21/2024 Table formatting from the original result was not included. Images from the original result were not included. Modified Barium Swallow Study Patient Details Name: OBIE KALLENBACH MRN: [id number] Date of Birth: [birth year] Today's Date: 05/21/2024 HPI/PMH: HPI: Pt is an 83 y/o male who [facility]/18/24 post ground level fall. CXR shows Progressive superimposed left lower lobe airspace disease is consistent with  pneumonia or aspiration pneumonitis. Pt has been see in prior visits for chronic coughing. Has not had any instrumental testing.  PMH significant for PAD, carotid artery stenosis, HTN, prior history of tobacco abuse, stroke/TIA, COPD, anemia.  Recent hospitalization 5/25 for SOB and COPD. Clinical Impression: Pt presents with Yailen Zemaitis moderate-severe pharyngeal dysphagia and concerns for Glenda Kunst primary esophageal dysphagia per results of MBSS completed today. There was aspiration of thin liquids, nectar-thick liquids, and honey-thick liquid residue vs puree vs mixture. Majority of aspiration events were silent with occasional delayed throat clearing observed following some aspiration events of thin liquids. Oral phase judged to be grossly WNL. Pharyngeal deficits included mistiming of swallow initiation vs incomplete closure of laryngeal vestibule contributing to penetration/aspiration events. There was reduced pharyngeal stripping resulting in min residue in vallecular space and posterior pharyngeal wall post swallow. Concern for esophageal dysphagia included significant and progressive retention of liquid and solid residue throughout the esophagus. There was x1 instance of backflow of thin liquids through the UES that briefly penetrated the airway before being re-swallowed. Image below of esophageal sweep at end of study. Poor  esophageal clearance of liquids and solids. Recommend GI consult. SLP to follow up to trial 5cc Provale cup with pt to determine if pt can have small sips of thin liquids. Diet order pending based on tolerance. Updated RN and MD on MBSS results and recommendations. Factors that may increase risk of adverse event in presence of aspiration Noe & Lianne 2021): Factors that may increase risk of adverse event in presence of aspiration Noe & Lianne 2021): Respiratory or GI disease; Frail or deconditioned; Limited mobility; Weak cough; Frequent aspiration of large volumes Recommendations/Plan: Swallowing Evaluation Recommendations Swallowing Evaluation Recommendations Recommendations: PO diet; Ice chips PRN after oral care PO Diet Recommendation: Thin liquids (Level 0); Clear liquid diet (via spooon or 5cc Provale cup) Medication Administration: Crushed with puree Supervision: Patient able to self-feed Swallowing strategies  : Slow rate; Small bites/sips (liquids by spoon or 5cc Provale cup only) Postural changes: Position pt fully upright for meals; Stay upright 30-60 min after meals Oral care recommendations: Oral care QID (4x/day) Recommended consults: Consider GI consultation Treatment Plan Treatment Plan Treatment recommendations: Therapy as outlined in treatment plan below Follow-up recommendations: -- (TBD) Functional status assessment: Patient has had Shenay Torti recent decline in their functional status and demonstrates the ability to make significant improvements in function in Dory Verdun reasonable and predictable amount of time. Treatment frequency: Min 2x/week Treatment duration: 4 weeks Interventions: Aspiration precaution training; Diet toleration management by SLP; Patient/family education Recommendations Recommendations for follow up therapy are one component of Tylen Leverich multi-disciplinary discharge planning process, led by the attending physician.  Recommendations may be updated based on patient status, additional  functional criteria and insurance authorization. Assessment: Orofacial Exam: Orofacial Exam Oral Cavity: Oral Hygiene: WFL Oral Cavity - Dentition: Dentures, top; Dentures, bottom Orofacial Anatomy: WFL Oral Motor/Sensory Function: WFL Anatomy: No data recorded Boluses Administered: Boluses Administered Boluses Administered: Thin liquids (Level 0); Mildly thick liquids (Level 2, nectar thick); Moderately thick liquids (Level  3, honey thick); Puree; Solid  Oral Impairment Domain: Oral Impairment Domain Lip Closure: No labial escape Tongue control during bolus hold: Posterior escape of less than half of bolus Bolus preparation/mastication: Timely and efficient chewing and mashing Bolus transport/lingual motion: Delayed initiation of tongue motion (oral holding) Oral residue: Trace residue lining oral structures Location of oral residue : Tongue Initiation of pharyngeal swallow : Valleculae  Pharyngeal Impairment Domain: Pharyngeal Impairment Domain Soft palate elevation: No bolus between soft palate (SP)/pharyngeal wall (PW) Laryngeal elevation: Complete superior movement of thyroid  cartilage with complete approximation of arytenoids to epiglottic petiole Anterior hyoid excursion: Complete anterior movement Epiglottic movement: Complete inversion Laryngeal vestibule closure: Incomplete, narrow column air/contrast in laryngeal vestibule Pharyngeal stripping wave : Present - diminished Pharyngeal contraction (Shannara Winbush/P view only): N/Augusta Mirkin Pharyngoesophageal segment opening: Complete distension and complete duration, no obstruction of flow Tongue base retraction: Trace column of contrast or air between tongue base and PPW Pharyngeal residue: Trace residue within or on pharyngeal structures Location of pharyngeal residue: Valleculae; Tongue base  Esophageal Impairment Domain: Esophageal Impairment Domain Esophageal clearance upright position: Esophageal retention with retrograde flow through the PES; Minimal to no esophageal  clearance Pill: Pill Consistency administered: -- (not administered) Penetration/Aspiration Scale Score: Penetration/Aspiration Scale Score 1.  Material does not enter airway: Thin liquids (Level 0); Solid 2.  Material enters airway, remains ABOVE vocal cords then ejected out: Moderately thick liquids (Level 3, honey thick) 5.  Material enters airway, CONTACTS cords and not ejected out: Mildly thick liquids (Level 2, nectar thick) 6.  Material enters airway, passes BELOW cords then ejected out: Mildly thick liquids (Level 2, nectar thick) 8.  Material enters airway, passes BELOW cords without attempt by patient to eject out (silent aspiration) : Moderately thick liquids (Level 3, honey thick); Puree; Thin liquids (Level 0) Compensatory Strategies: Compensatory Strategies Compensatory strategies: Yes Straw: Ineffective Ineffective Straw: Thin liquid (Level 0) Multiple swallows: Ineffective Chin tuck: Ineffective Ineffective Chin Tuck: Thin liquid (Level 0)   General Information: No data recorded Diet Prior to this Study: Regular; Thin liquids (Level 0)   No data recorded  No data recorded  No data recorded  No data recorded Behavior/Cognition: Alert; Cooperative No data recorded No data recorded No data recorded Volitional Swallow: Able to elicit Exam Limitations: No limitations Goal Planning: Prognosis for improved oropharyngeal function: Fair No data recorded No data recorded No data recorded Consulted and agree with results and recommendations: Patient; Nurse; Physician Pain: Pain Assessment Pain Assessment: No/denies pain End of Session: Start Time:SLP Start Time (ACUTE ONLY): 0805 Stop Time: SLP Stop Time (ACUTE ONLY): 0835 Time Calculation:SLP Time Calculation (min) (ACUTE ONLY): 30 min Charges: SLP Evaluations $ SLP Speech Visit: 1 Visit SLP Evaluations $BSS Swallow: 1 Procedure $MBS Swallow: 1 Procedure SLP visit diagnosis: SLP Visit Diagnosis: Dysphagia, pharyngoesophageal phase (R13.14) Past Medical  History: Past Medical History: Diagnosis Date  Abnormality of gait 03/03/2013  bladder ca dx'd 11/2009  chemo/xrt comp 02/2010  Cerebrovascular disease   right brain CVA 2007  COPD (chronic obstructive pulmonary disease) (HCC)   Cough productive of clear sputum   TX RECENT SINUS INFECTION   CVA (cerebral vascular accident) (HCC) 2007  r-cva  Dyslipidemia   Hypertension   Lumbago  Past Surgical History: Past Surgical History: Procedure Laterality Date  ABDOMINAL AORTAGRAM N/Dessirae Scarola 12/07/2013  Procedure: ABDOMINAL EZELLA;  Surgeon: Lonni GORMAN Blade, MD;  Location: [facility] CATH LAB;  Service: Cardiovascular;  Laterality: N/Genee Rann;  ABDOMINAL AORTOGRAM W/LOWER EXTREMITY N/Amiliah Campisi 06/26/2023  Procedure: ABDOMINAL  AORTOGRAM W/LOWER EXTREMITY;  Surgeon: Lanis Fonda BRAVO, MD;  Location: [facility] INVASIVE CV LAB;  Service: Cardiovascular;  Laterality: N/Gabrianna Fassnacht;  BACK SURGERY  2010  bladder cancer    CAROTID ARTERY ANGIOPLASTY  2009  CHOLECYSTECTOMY N/Lenell Lama 06/06/2016  Procedure: LAPAROSCOPIC CHOLECYSTECTOMY WITH INTRAOPERATIVE CHOLANGIOGRAM;  Surgeon: Krystal Spinner, MD;  Location: [facility] ORS;  Service: General;  Laterality: N/Keeva Reisen;  ERCP N/Yakima Kreitzer 06/05/2016  Procedure: ENDOSCOPIC RETROGRADE CHOLANGIOPANCREATOGRAPHY (ERCP);  Surgeon: Norleen Hint, MD;  Location: [facility];  Service: Endoscopy;  Laterality: N/Myrka Sylva;  ESOPHAGOGASTRODUODENOSCOPY (EGD) WITH PROPOFOL  N/Laquincy Eastridge 06/05/2016  Procedure: ESOPHAGOGASTRODUODENOSCOPY (EGD);  Surgeon: Norleen Hint, MD;  Location: [facility];  Service: Endoscopy;  Laterality: N/Analysa Nutting;  FEMORAL-FEMORAL BYPASS GRAFT N/Veniamin Kincaid 12/09/2013  Procedure: BYPASS GRAFT FEMORAL-FEMORAL ARTERY WITH BILATERAL ENDARTERECTOMY ;  Surgeon: Carlin BRAVO Haddock, MD;  Location: [facility] OR;  Service: Vascular;  Laterality: N/Stephene Alegria;  FEMORAL-POPLITEAL BYPASS GRAFT Left 12/09/2013  Procedure: BYPASS GRAFT FEMORAL-POPLITEAL ARTERY - LEFT ;  Surgeon: Carlin BRAVO Haddock, MD;  Location: [facility] OR;  Service: Vascular;  Laterality: Left;  LAMINECTOMY  09/08/2013  L 2 L3 L4 L5       Dr Amon  LOWER  EXTREMITY ANGIOGRAPHY Right 07/03/2023  Procedure: Lower Extremity Angiography;  Surgeon: Lanis Fonda BRAVO, MD;  Location: [facility] INVASIVE CV LAB;  Service: Cardiovascular;  Laterality: Right;  PERIPHERAL VASCULAR BALLOON ANGIOPLASTY Right 07/03/2023  Procedure: PERIPHERAL VASCULAR BALLOON ANGIOPLASTY;  Surgeon: Lanis Fonda BRAVO, MD;  Location: [facility] INVASIVE CV LAB;  Service: Cardiovascular;  Laterality: Right;  R external iliac [name] 05/21/2024, 10:24 AM  DG Chest Port 1 View Result Date: 05/20/2024 CLINICAL DATA:  Shortness of breath EXAM: PORTABLE CHEST 1 VIEW COMPARISON:  05/20/2024 FINDINGS: Elevation of left hemidiaphragm. Heart size and pulmonary vascularity are normal. Emphysematous changes and fibrosis in the lungs. Suggestion of superimposed airspace disease on the left, possibly superimposed pneumonia right asymmetrical edema. No pleural effusion or pneumothorax. Similar appearance to previous study. Calcification of the aorta. Postoperative changes in the lumbar spine. IMPRESSION: No change since previous study. 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 Electronically Signed   By: Elsie Gravely M.D.   On: 05/20/2024 23:24   DG CHEST PORT 1 VIEW Result Date: 05/20/2024 CLINICAL DATA:  Shortness of breath. EXAM: PORTABLE CHEST 1 VIEW COMPARISON:  Chest radiograph dated 05/18/2024. FINDINGS: Background of emphysema and chronic Scholl coarsening. Bilateral reticulonodular airspace densities, left greater than right, and significantly increased since the prior radiograph consistent with pneumonia. Aspiration is not excluded. No pleural effusion pneumothorax. The cardiac silhouette is within limits. Atherosclerotic calcification of the aorta. No acute osseous pathology. IMPRESSION: Progression of pulmonary infiltrates. Electronically Signed   By: Vanetta Chou M.D.   On: 05/20/2024 18:34   CT Extrem Up Entire Arm R W/CM Result Date:  05/19/2024 CLINICAL DATA:  Swelling and pain after fall. EXAM: CT OF THE UPPER RIGHT EXTREMITY WITH CONTRAST TECHNIQUE: Multidetector CT imaging of the upper right extremity was performed according to the standard protocol following intravenous contrast administration. RADIATION DOSE REDUCTION: This exam was performed according to the departmental dose-optimization program which includes automated exposure control, adjustment of the mA and/or kV according to patient size and/or use of iterative reconstruction technique. CONTRAST:  75mL OMNIPAQUE  IOHEXOL  350 MG/ML SOLN COMPARISON:  Right elbow radiographs dated 05/10/2027. FINDINGS: Bones/Joint/Cartilage No acute fracture or dislocation. Advanced arthritic changes of the glenohumeral joint with severe joint space narrowing resulting in bone-on-bone contact and subchondral cystic changes within the  glenoid. Amorphous foci of mineralization within the glenohumeral joint may reflect chondrocalcinosis as can be seen with CPPD arthropathy. The radiocapitellar and ulnotrochlear joints are anatomically aligned with mild joint space narrowing. Arthritic changes of the wrist with radiocarpal joint space narrowing, foci of mineralization within the region of the TFCC suggestive of chondrocalcinosis as can be seen with CPPD arthropathy. Subchondral cystic changes in the scaphoid, lunate and capitate. Ligaments Ligaments are suboptimally evaluated by CT. Muscles and Tendons Visualized muscles are within normal limits for age. No loculated intramuscular fluid collection or hematoma. Soft tissue Within the subcutaneous tissues of the proximal dorsal forearm there is Sharona Rovner 9.4 x 4.4 x 2.7 cm heterogenous hyperdense collection, most compatible with Myson Levi hematoma, extending distally through the mid forearm. The deep margin this hematoma abuts the investing fascia of the underlying extensor musculature. No soft tissue gas. Subcutaneous edema extending along the dorsal elbow and forearm.  IMPRESSION: 1. Findings most compatible with Giovany Cosby 9.4 x 4.4 x 2.7 cm hematoma in the subcutaneous tissues of the proximal dorsal forearm. The deep margin of this hematoma abuts the fascia of the underlying extensor musculature. No loculated intramuscular collection identified. 2. No acute osseous abnormality. 3. 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 4. 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 Electronically Signed   By: Harrietta Sherry M.D.   On: 05/19/2024 08:49   CT Head Wo Contrast Result Date: 05/18/2024 EXAM: CT HEAD WITHOUT CONTRAST 05/18/2024 07:07:26 AM TECHNIQUE: CT of the head was performed without the administration of intravenous contrast. Automated exposure control, iterative reconstruction, and/or weight based adjustment of the mA/kV was utilized to reduce the radiation dose to as low as reasonably achievable. COMPARISON: CT head without contrast 01/30/2011 CLINICAL HISTORY: Head trauma, minor (Age >= 65y). Clemens out of bed. FINDINGS: BRAIN AND VENTRICLES: No acute hemorrhage. Gray-white differentiation is preserved. No hydrocephalus. No extra-axial collection. No mass effect or midline shift. Progressive atrophy and white matter disease are present bilaterally. Lacunar infarcts of the basal ganglia bilaterally are new since the prior study but appear remote. Jasdeep Kepner remote lacunar infarct is present in the right external capsule. ORBITS: No acute abnormality. SINUSES: No acute abnormality. SOFT TISSUES AND SKULL: No acute soft tissue abnormality. No skull fracture. Atherosclerotic calcifications are present in the cavernous carotid arteries bilaterally and at the dural margin of both vertebral arteries. No hyperdense vessel is present. IMPRESSION: 1. No acute intracranial abnormality related to the minor head trauma. 2. Progressive atrophy and white matter  disease bilaterally. 3. New lacunar infarcts of the basal ganglia bilaterally since the prior study, appearing remote. 4. Atherosclerotic calcifications in the cavernous carotid arteries bilaterally and at the dural margin of both vertebral arteries. No hyperdense vessel. Electronically signed by: Lonni Necessary MD 05/18/2024 07:33 AM EDT RP Workstation: [id number]   DG Chest Port 1 View Result Date: 05/18/2024 EXAM: 1 VIEW XRAY OF THE CHEST 05/18/2024 05:13:00 AM COMPARISON: 1 view chest x-ray 05/09/2024 and 05/18/2024. CLINICAL HISTORY: Shortness of breath. 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 Progressive superimposed left lower lobe airspace disease is present. No pleural effusion. No pneumothorax. HEART AND MEDIASTINUM: No acute abnormality of the cardiac and mediastinal silhouettes. Aortic atherosclerosis is present. BONES AND SOFT TISSUES: No acute osseous abnormality. IMPRESSION: 1. Progressive superimposed left lower lobe airspace disease is consistent with pneumonia or aspiration pneumonitis 2. Chronic pulmonary fibrotic  changes. 3. Aortic atherosclerosis. Electronically signed by: Lonni Necessary MD 05/18/2024 06:10 AM EDT RP Workstation: [id number]   DG Chest Port 1 View Result Date: 05/18/2024 CLINICAL DATA:  Question of sepsis to evaluate for abnormality. Fall. Generalized fatigue. EXAM: PORTABLE CHEST 1 VIEW COMPARISON:  05/09/2024 FINDINGS: Elevation of the left hemidiaphragm. Interstitial pattern to the left lung is similar to prior study likely representing fibrosis. No consolidation or airspace disease. No pleural effusion or pneumothorax. Mediastinal contours appear intact. Calcification of the aorta. Degenerative changes in the spine and shoulders. 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 No evidence of active pulmonary disease. Electronically Signed   By: Elsie Gravely M.D.   On: 05/18/2024 01:34   DG Chest Port 1  View Result Date: 05/09/2024 CLINICAL DATA:  right rib pain EXAM: PORTABLE CHEST - 1 VIEW COMPARISON:  03/17/2024 FINDINGS: Chronic coarse interstitial markings particularly in the lung bases, left greater than right. No pneumothorax. Heart size and mediastinal contours are within normal limits. Aortic Atherosclerosis (ICD10-170.0). No effusion.  Electronic device overlies the left mid lung. Bilateral shoulder DJD. IMPRESSION: Chronic interstitial markings. No acute findings. Electronically Signed   By: JONETTA Faes M.D.   On: 05/09/2024 12:00   DG Forearm Right Result Date: 05/09/2024 CLINICAL DATA:  fall, pain in both arms EXAM: RIGHT FOREARM - 2 VIEW COMPARISON:  None Available. FINDINGS: Osteopenia.No acute fracture or dislocation. There is no evidence of arthropathy or other focal bone abnormality. Peripheral vascular atherosclerosis. Moderate soft tissue swelling and edema along the posterolateral aspect of the proximal forearm. IMPRESSION: Moderate soft tissue swelling and edema about the posterolateral aspect of the proximal forearm. No acute fracture or dislocation. Electronically Signed   By: Rogelia Myers M.D.   On: 05/09/2024 11:49   DG Elbow 2 Views Right Addendum Date: 05/09/2024 ADDENDUM REPORT: 05/09/2024 11:48 ADDENDUM: The region of proximal forearm swelling was incorrectly dictated. The swelling is actually in the posterolateral aspect of the proximal forearm. Electronically Signed   By: Rogelia Myers M.D.   On: 05/09/2024 11:48   Result Date: 05/09/2024 CLINICAL DATA:  [id number] Fall 190176, pain in both arms EXAM: RIGHT ELBOW - 2 VIEW COMPARISON:  None Available. FINDINGS: Osteopenia.No acute fracture or dislocation. No joint effusion. There is no evidence of arthropathy or other focal bone abnormality. Peripheral vascular atherosclerosis. Moderate soft tissue swelling and edema along the posteromedial aspect of the proximal forearm. IMPRESSION: Moderate soft tissue swelling and edema  along the posteromedial aspect of the proximal forearm. No acute fracture or dislocation. Electronically Signed: By: Rogelia Myers M.D. On: 05/09/2024 11:44   DG Elbow 2 Views Left Result Date: 05/09/2024 CLINICAL DATA:  [id number] Fall 190176 EXAM: LEFT ELBOW - 2 VIEW COMPARISON:  None Available. FINDINGS: Osteopenia.No acute fracture or dislocation. No joint effusion. There is no evidence of arthropathy or other focal bone abnormality. Mild soft tissue swelling posteriorly about the olecranon. Peripheral vascular atherosclerosis. IMPRESSION: Mild soft tissue swelling posteriorly about the olecranon. No acute fracture or dislocation. Electronically Signed   By: Rogelia Myers M.D.   On: 05/09/2024 11:45   DG Forearm Left Result Date: 05/09/2024 CLINICAL DATA:  fall, pain in both arms EXAM: LEFT FOREARM - 2 VIEW COMPARISON:  None Available. FINDINGS: Osteopenia.No acute fracture or dislocation. There is no evidence of arthropathy or other focal bone abnormality. Peripheral vascular atherosclerosis. IMPRESSION: No acute fracture or dislocation. Electronically Signed   By: Rogelia Myers M.D.   On: 05/09/2024 11:42  DG Humerus Right Result Date: 05/09/2024 CLINICAL DATA:  fall, pain in both arms EXAM: RIGHT HUMERUS - 2+ VIEW COMPARISON:  None Available. FINDINGS: Osteopenia.No acute fracture or dislocation. Superior subluxation of the humeral head with respect to the glenoid, suggesting chronic rotator cuff tear. Peripheral vascular atherosclerosis. IMPRESSION: No acute fracture or dislocation. Electronically Signed   By: Rogelia Myers M.D.   On: 05/09/2024 11:41   DG Humerus Left Result Date: 05/09/2024 CLINICAL DATA:  fall, pain in both arms EXAM: LEFT HUMERUS - 2+ VIEW COMPARISON:  None Available. FINDINGS: Osteopenia.No acute fracture or dislocation. Apparent superior subluxation of the humeral head with respect to the glenohumeral joint, possibly reflecting an underlying chronic rotator cuff tear.  Peripheral vascular atherosclerosis. IMPRESSION: No acute fracture or dislocation. Electronically Signed   By: Rogelia Myers M.D.   On: 05/09/2024 11:40    Microbiology: Recent Results (from the past 240 hours)  Blood Culture (routine x 2)     Status: None   Collection Time: 05/18/24  1:21 AM   Specimen: BLOOD  Result Value Ref Range Status   Specimen Description BLOOD LEFT ARM  Final   Special Requests   Final    BOTTLES DRAWN AEROBIC AND ANAEROBIC Blood Culture adequate volume   Culture   Final    NO GROWTH 5 DAYS Performed at [facility], [address], [place], [place] [address]    Report Status 05/23/2024 FINAL  Final  Blood Culture (routine x 2)     Status: None   Collection Time: 05/18/24  1:26 AM   Specimen: BLOOD RIGHT HAND  Result Value Ref Range Status   Specimen Description BLOOD RIGHT HAND  Final   Special Requests   Final    BOTTLES DRAWN AEROBIC AND ANAEROBIC Blood Culture adequate volume   Culture   Final    NO GROWTH 5 DAYS Performed at [facility], [address], [place], [place] [address]    Report Status 05/23/2024 FINAL  Final  MRSA Next Gen by PCR, Nasal     Status: None   Collection Time: 05/25/24  9:05 AM   Specimen: Nasal Mucosa; Nasal Swab  Result Value Ref Range Status   MRSA by PCR Next Gen NOT DETECTED NOT DETECTED Final    Comment: (NOTE) The GeneXpert MRSA Assay (FDA approved for NASAL specimens only), is one component of Guilianna Mckoy comprehensive MRSA colonization surveillance program. It is not intended to diagnose MRSA infection nor to guide or monitor treatment for MRSA infections. Test performance is not FDA approved in patients less than 57 years old. Performed at [facility], [address], [place], [place] [address]      Labs: Basic Metabolic Panel: Recent Labs  Lab 05/22/24 0251 05/23/24 0320 05/24/24 0321 05/25/24 0318 05/26/24 0303  NA 141 137 139 136 136  K 3.6 3.0* 4.2 4.5 4.5  CL 99 99 102 99 102   CO2 30 24 26 25 26   GLUCOSE 111* 99 110* 99 111*  BUN 15 18 21 21  26*  CREATININE 0.77 0.69 0.81 0.76 0.80  CALCIUM  8.5* 8.2* 8.9 8.9 8.6*  MG 2.1 1.8 2.0 1.9 1.8  PHOS 3.4 3.6 3.6 3.5 4.0   Liver Function Tests: Recent Labs  Lab 05/21/24 0028 05/22/24 0251 05/23/24 0320 05/24/24 0321 05/26/24 0303  AST 53* 49* 54* 40 25  ALT 41 47* 53* 47* 33  ALKPHOS 65 74 69 82 69  BILITOT 0.9 1.1 1.0 1.0 0.7  PROT 6.1* 5.9* 5.6* 5.8* 5.2*  ALBUMIN 2.9* 2.7* 2.5* 2.7* 2.4*   No results for input(s): LIPASE, AMYLASE in the last 168 hours. Recent Labs  Lab 05/23/24 0320  AMMONIA 22   CBC: Recent Labs  Lab 05/21/24 0028 05/22/24 0251 05/23/24 0320 05/24/24 0321 05/25/24 0318 05/26/24 0303  WBC 15.7* 11.6* 9.2 15.1* 20.3* 14.0*  NEUTROABS 13.8* 10.1* 7.6 13.0*  --  11.8*  HGB 9.7* 9.6* 9.3* 10.4* 11.0* 9.2*  HCT 29.8* 29.9* 29.3* 33.1* 36.0* 29.8*  MCV 99.7 99.0 100.3* 100.6* 101.7* 101.7*  PLT 300 292 260 298 294 251   Cardiac Enzymes: No results for input(s): CKTOTAL, CKMB, CKMBINDEX, TROPONINI in the last 168 hours. BNP: BNP (last 3 results) Recent Labs    05/20/24 1525 05/21/24 0028 05/23/24 0320  BNP 1,742.6* 1,768.7* 264.2*    ProBNP (last 3 results) No results for input(s): PROBNP in the last 8760 hours.  CBG: No results for input(s): GLUCAP in the last 168 hours.     Signed:  Meliton Monte MD.  [facility] 05/26/2024, 11:13 AM

## 2024-05-26 NOTE — Progress Notes (Signed)
 Report called to [employer], nurse at [facility]. Patient left floor via PTAR. AVS place in discharge packed and copy provided to patient's wife. IV removed, belongings sent with PTAR.

## 2024-05-27 ENCOUNTER — Telehealth: Payer: Self-pay | Admitting: Licensed Clinical Social Worker

## 2024-05-27 DIAGNOSIS — Z741 Need for assistance with personal care: Secondary | ICD-10-CM | POA: Diagnosis not present

## 2024-05-27 DIAGNOSIS — E440 Moderate protein-calorie malnutrition: Secondary | ICD-10-CM | POA: Diagnosis not present

## 2024-05-27 DIAGNOSIS — R2689 Other abnormalities of gait and mobility: Secondary | ICD-10-CM | POA: Diagnosis not present

## 2024-05-27 DIAGNOSIS — J449 Chronic obstructive pulmonary disease, unspecified: Secondary | ICD-10-CM | POA: Diagnosis not present

## 2024-05-27 DIAGNOSIS — I5032 Chronic diastolic (congestive) heart failure: Secondary | ICD-10-CM | POA: Diagnosis not present

## 2024-05-27 DIAGNOSIS — J9601 Acute respiratory failure with hypoxia: Secondary | ICD-10-CM | POA: Diagnosis not present

## 2024-05-27 DIAGNOSIS — M6281 Muscle weakness (generalized): Secondary | ICD-10-CM | POA: Diagnosis not present

## 2024-05-27 DIAGNOSIS — Z9181 History of falling: Secondary | ICD-10-CM | POA: Diagnosis not present

## 2024-05-27 DIAGNOSIS — I4891 Unspecified atrial fibrillation: Secondary | ICD-10-CM | POA: Diagnosis not present

## 2024-05-27 NOTE — Telephone Encounter (Signed)
 H&V Care Navigation CSW Progress Note  Clinical Social Worker contacted caregiver by phone to f/u on resources sent. Noted pt recent hospital stay, now at SNF, pt wife answered at [phone]. Shares she is at doctors appt. Re-introduced self, role, reason for call. She shares she'll call me back as able. Remain available should any additional questions arise.  Patient is participating in a Managed Medicaid Plan:  No, Medicare and AARP and VA?  SDOH Screenings   Food Insecurity: No Food Insecurity (05/18/2024)  Housing: Low Risk  (05/18/2024)  Transportation Needs: No Transportation Needs (05/18/2024)  Utilities: Not At Risk (05/18/2024)  Financial Resource Strain: Low Risk  (04/28/2024)  Social Connections: Patient Unable To Answer (05/18/2024)  Recent Concern: Social Connections - Moderately Isolated (03/18/2024)  Tobacco Use: Medium Risk (05/22/2024)  Health Literacy: Adequate Health Literacy (04/28/2024)     Marit Lark, MSW, LCSW Clinical Social Worker II [facility] Health Heart/Vascular Care Navigation  [phone]- work cell phone (preferred)

## 2024-05-28 ENCOUNTER — Ambulatory Visit: Admitting: Vascular Surgery

## 2024-05-28 ENCOUNTER — Encounter (HOSPITAL_COMMUNITY)

## 2024-05-28 ENCOUNTER — Telehealth: Payer: Self-pay | Admitting: Licensed Clinical Social Worker

## 2024-05-28 NOTE — Telephone Encounter (Signed)
 H&V Care Navigation CSW Progress Note  Clinical Social Worker contacted caregiver by phone to f/u on resources sent. Today was able to reach pt wife Thomas Ferrell. She shares they have received lots of paperwork and she doesn't know if she received resources sent. We discussed I would send them again, unfortunately pt likely isnt eligible for VA benefits for long term care. Son did some research into this. Pt has applied for Medicaid, as likely needs long term care at this time. LCSW has sent information about [employer] for Medicare benefits, and my card. Remain available as needed for any additional community resources.  Patient is participating in a Managed Medicaid Plan:  No, Medicare and AARP supplement  SDOH Screenings   Food Insecurity: No Food Insecurity (05/18/2024)  Housing: Low Risk  (05/18/2024)  Transportation Needs: No Transportation Needs (05/18/2024)  Utilities: Not At Risk (05/18/2024)  Financial Resource Strain: Low Risk  (04/28/2024)  Social Connections: Patient Unable To Answer (05/18/2024)  Recent Concern: Social Connections - Moderately Isolated (03/18/2024)  Tobacco Use: Medium Risk (05/22/2024)  Health Literacy: Adequate Health Literacy (04/28/2024)     Marit Lark, MSW, LCSW Clinical Social Worker II [facility] Health Heart/Vascular Care Navigation  [phone]- work cell phone (preferred)

## 2024-05-29 DIAGNOSIS — K219 Gastro-esophageal reflux disease without esophagitis: Secondary | ICD-10-CM | POA: Diagnosis not present

## 2024-05-29 DIAGNOSIS — J449 Chronic obstructive pulmonary disease, unspecified: Secondary | ICD-10-CM | POA: Diagnosis not present

## 2024-05-29 DIAGNOSIS — G629 Polyneuropathy, unspecified: Secondary | ICD-10-CM | POA: Diagnosis not present

## 2024-05-29 DIAGNOSIS — I10 Essential (primary) hypertension: Secondary | ICD-10-CM | POA: Diagnosis not present

## 2024-05-30 ENCOUNTER — Encounter (HOSPITAL_COMMUNITY): Payer: Self-pay | Admitting: Gastroenterology

## 2024-06-03 DIAGNOSIS — J9601 Acute respiratory failure with hypoxia: Secondary | ICD-10-CM | POA: Diagnosis not present

## 2024-06-03 DIAGNOSIS — E440 Moderate protein-calorie malnutrition: Secondary | ICD-10-CM | POA: Diagnosis not present

## 2024-06-03 DIAGNOSIS — Z9181 History of falling: Secondary | ICD-10-CM | POA: Diagnosis not present

## 2024-06-03 DIAGNOSIS — I5032 Chronic diastolic (congestive) heart failure: Secondary | ICD-10-CM | POA: Diagnosis not present

## 2024-06-03 DIAGNOSIS — J449 Chronic obstructive pulmonary disease, unspecified: Secondary | ICD-10-CM | POA: Diagnosis not present

## 2024-06-03 DIAGNOSIS — I4891 Unspecified atrial fibrillation: Secondary | ICD-10-CM | POA: Diagnosis not present

## 2024-06-03 DIAGNOSIS — M6281 Muscle weakness (generalized): Secondary | ICD-10-CM | POA: Diagnosis not present

## 2024-06-03 DIAGNOSIS — Z741 Need for assistance with personal care: Secondary | ICD-10-CM | POA: Diagnosis not present

## 2024-06-03 DIAGNOSIS — R2689 Other abnormalities of gait and mobility: Secondary | ICD-10-CM | POA: Diagnosis not present

## 2024-06-05 DIAGNOSIS — I10 Essential (primary) hypertension: Secondary | ICD-10-CM | POA: Diagnosis not present

## 2024-06-05 DIAGNOSIS — E785 Hyperlipidemia, unspecified: Secondary | ICD-10-CM | POA: Diagnosis not present

## 2024-06-05 DIAGNOSIS — J449 Chronic obstructive pulmonary disease, unspecified: Secondary | ICD-10-CM | POA: Diagnosis not present

## 2024-06-05 DIAGNOSIS — K219 Gastro-esophageal reflux disease without esophagitis: Secondary | ICD-10-CM | POA: Diagnosis not present

## 2024-06-10 ENCOUNTER — Ambulatory Visit: Admitting: Cardiology

## 2024-06-16 DIAGNOSIS — Z87891 Personal history of nicotine dependence: Secondary | ICD-10-CM | POA: Diagnosis not present

## 2024-06-16 DIAGNOSIS — Z981 Arthrodesis status: Secondary | ICD-10-CM | POA: Diagnosis not present

## 2024-06-16 DIAGNOSIS — Z7901 Long term (current) use of anticoagulants: Secondary | ICD-10-CM | POA: Diagnosis not present

## 2024-06-16 DIAGNOSIS — J9601 Acute respiratory failure with hypoxia: Secondary | ICD-10-CM | POA: Diagnosis not present

## 2024-06-16 DIAGNOSIS — Z556 Problems related to health literacy: Secondary | ICD-10-CM | POA: Diagnosis not present

## 2024-06-16 DIAGNOSIS — J441 Chronic obstructive pulmonary disease with (acute) exacerbation: Secondary | ICD-10-CM | POA: Diagnosis not present

## 2024-06-16 DIAGNOSIS — M19031 Primary osteoarthritis, right wrist: Secondary | ICD-10-CM | POA: Diagnosis not present

## 2024-06-16 DIAGNOSIS — Z8673 Personal history of transient ischemic attack (TIA), and cerebral infarction without residual deficits: Secondary | ICD-10-CM | POA: Diagnosis not present

## 2024-06-16 DIAGNOSIS — M25511 Pain in right shoulder: Secondary | ICD-10-CM | POA: Diagnosis not present

## 2024-06-16 DIAGNOSIS — Z7982 Long term (current) use of aspirin: Secondary | ICD-10-CM | POA: Diagnosis not present

## 2024-06-16 DIAGNOSIS — Z7902 Long term (current) use of antithrombotics/antiplatelets: Secondary | ICD-10-CM | POA: Diagnosis not present

## 2024-06-16 DIAGNOSIS — Z8551 Personal history of malignant neoplasm of bladder: Secondary | ICD-10-CM | POA: Diagnosis not present

## 2024-06-16 DIAGNOSIS — E7800 Pure hypercholesterolemia, unspecified: Secondary | ICD-10-CM | POA: Diagnosis not present

## 2024-06-16 DIAGNOSIS — M48062 Spinal stenosis, lumbar region with neurogenic claudication: Secondary | ICD-10-CM | POA: Diagnosis not present

## 2024-06-16 DIAGNOSIS — M533 Sacrococcygeal disorders, not elsewhere classified: Secondary | ICD-10-CM | POA: Diagnosis not present

## 2024-06-16 DIAGNOSIS — M4726 Other spondylosis with radiculopathy, lumbar region: Secondary | ICD-10-CM | POA: Diagnosis not present

## 2024-06-16 DIAGNOSIS — G8929 Other chronic pain: Secondary | ICD-10-CM | POA: Diagnosis not present

## 2024-06-29 DIAGNOSIS — M48062 Spinal stenosis, lumbar region with neurogenic claudication: Secondary | ICD-10-CM | POA: Diagnosis not present

## 2024-06-29 DIAGNOSIS — M533 Sacrococcygeal disorders, not elsewhere classified: Secondary | ICD-10-CM | POA: Diagnosis not present

## 2024-06-29 DIAGNOSIS — G8929 Other chronic pain: Secondary | ICD-10-CM | POA: Diagnosis not present

## 2024-06-29 DIAGNOSIS — Z8551 Personal history of malignant neoplasm of bladder: Secondary | ICD-10-CM | POA: Diagnosis not present

## 2024-06-29 DIAGNOSIS — E7800 Pure hypercholesterolemia, unspecified: Secondary | ICD-10-CM | POA: Diagnosis not present

## 2024-06-29 DIAGNOSIS — M25511 Pain in right shoulder: Secondary | ICD-10-CM | POA: Diagnosis not present

## 2024-06-29 DIAGNOSIS — J9601 Acute respiratory failure with hypoxia: Secondary | ICD-10-CM | POA: Diagnosis not present

## 2024-06-29 DIAGNOSIS — J441 Chronic obstructive pulmonary disease with (acute) exacerbation: Secondary | ICD-10-CM | POA: Diagnosis not present

## 2024-06-29 DIAGNOSIS — M19031 Primary osteoarthritis, right wrist: Secondary | ICD-10-CM | POA: Diagnosis not present

## 2024-06-29 DIAGNOSIS — M4726 Other spondylosis with radiculopathy, lumbar region: Secondary | ICD-10-CM | POA: Diagnosis not present

## 2024-06-29 DIAGNOSIS — Z8673 Personal history of transient ischemic attack (TIA), and cerebral infarction without residual deficits: Secondary | ICD-10-CM | POA: Diagnosis not present

## 2024-06-29 DIAGNOSIS — Z7901 Long term (current) use of anticoagulants: Secondary | ICD-10-CM | POA: Diagnosis not present

## 2024-06-29 DIAGNOSIS — Z556 Problems related to health literacy: Secondary | ICD-10-CM | POA: Diagnosis not present

## 2024-06-29 DIAGNOSIS — Z981 Arthrodesis status: Secondary | ICD-10-CM | POA: Diagnosis not present

## 2024-06-29 DIAGNOSIS — Z7982 Long term (current) use of aspirin: Secondary | ICD-10-CM | POA: Diagnosis not present

## 2024-06-29 DIAGNOSIS — Z87891 Personal history of nicotine dependence: Secondary | ICD-10-CM | POA: Diagnosis not present

## 2024-06-29 DIAGNOSIS — Z7902 Long term (current) use of antithrombotics/antiplatelets: Secondary | ICD-10-CM | POA: Diagnosis not present

## 2024-07-02 ENCOUNTER — Encounter (HOSPITAL_COMMUNITY)

## 2024-07-02 ENCOUNTER — Ambulatory Visit

## 2024-07-02 ENCOUNTER — Other Ambulatory Visit (HOSPITAL_COMMUNITY)

## 2024-07-02 DIAGNOSIS — R5381 Other malaise: Secondary | ICD-10-CM | POA: Diagnosis not present

## 2024-07-02 DIAGNOSIS — I10 Essential (primary) hypertension: Secondary | ICD-10-CM | POA: Diagnosis not present

## 2024-07-02 DIAGNOSIS — E785 Hyperlipidemia, unspecified: Secondary | ICD-10-CM | POA: Diagnosis not present

## 2024-07-02 DIAGNOSIS — R262 Difficulty in walking, not elsewhere classified: Secondary | ICD-10-CM | POA: Diagnosis not present

## 2024-07-02 DIAGNOSIS — I5032 Chronic diastolic (congestive) heart failure: Secondary | ICD-10-CM | POA: Diagnosis not present

## 2024-07-02 DIAGNOSIS — J449 Chronic obstructive pulmonary disease, unspecified: Secondary | ICD-10-CM | POA: Diagnosis not present

## 2024-07-02 DIAGNOSIS — I480 Paroxysmal atrial fibrillation: Secondary | ICD-10-CM | POA: Diagnosis not present

## 2024-07-03 DIAGNOSIS — J9601 Acute respiratory failure with hypoxia: Secondary | ICD-10-CM | POA: Diagnosis not present

## 2024-07-03 DIAGNOSIS — Z87891 Personal history of nicotine dependence: Secondary | ICD-10-CM | POA: Diagnosis not present

## 2024-07-03 DIAGNOSIS — Z8673 Personal history of transient ischemic attack (TIA), and cerebral infarction without residual deficits: Secondary | ICD-10-CM | POA: Diagnosis not present

## 2024-07-03 DIAGNOSIS — Z7982 Long term (current) use of aspirin: Secondary | ICD-10-CM | POA: Diagnosis not present

## 2024-07-03 DIAGNOSIS — M48062 Spinal stenosis, lumbar region with neurogenic claudication: Secondary | ICD-10-CM | POA: Diagnosis not present

## 2024-07-03 DIAGNOSIS — Z7902 Long term (current) use of antithrombotics/antiplatelets: Secondary | ICD-10-CM | POA: Diagnosis not present

## 2024-07-03 DIAGNOSIS — M4726 Other spondylosis with radiculopathy, lumbar region: Secondary | ICD-10-CM | POA: Diagnosis not present

## 2024-07-03 DIAGNOSIS — E7800 Pure hypercholesterolemia, unspecified: Secondary | ICD-10-CM | POA: Diagnosis not present

## 2024-07-03 DIAGNOSIS — M25511 Pain in right shoulder: Secondary | ICD-10-CM | POA: Diagnosis not present

## 2024-07-03 DIAGNOSIS — Z981 Arthrodesis status: Secondary | ICD-10-CM | POA: Diagnosis not present

## 2024-07-03 DIAGNOSIS — G8929 Other chronic pain: Secondary | ICD-10-CM | POA: Diagnosis not present

## 2024-07-03 DIAGNOSIS — Z7901 Long term (current) use of anticoagulants: Secondary | ICD-10-CM | POA: Diagnosis not present

## 2024-07-03 DIAGNOSIS — Z556 Problems related to health literacy: Secondary | ICD-10-CM | POA: Diagnosis not present

## 2024-07-03 DIAGNOSIS — Z8551 Personal history of malignant neoplasm of bladder: Secondary | ICD-10-CM | POA: Diagnosis not present

## 2024-07-03 DIAGNOSIS — M19031 Primary osteoarthritis, right wrist: Secondary | ICD-10-CM | POA: Diagnosis not present

## 2024-07-03 DIAGNOSIS — J441 Chronic obstructive pulmonary disease with (acute) exacerbation: Secondary | ICD-10-CM | POA: Diagnosis not present

## 2024-07-03 DIAGNOSIS — M533 Sacrococcygeal disorders, not elsewhere classified: Secondary | ICD-10-CM | POA: Diagnosis not present

## 2024-07-13 DEATH — deceased

## 2024-07-16 ENCOUNTER — Encounter (HOSPITAL_COMMUNITY)

## 2024-07-16 ENCOUNTER — Other Ambulatory Visit (HOSPITAL_COMMUNITY)

## 2024-07-16 ENCOUNTER — Ambulatory Visit

## 2024-08-17 ENCOUNTER — Ambulatory Visit (HOSPITAL_COMMUNITY): Admitting: Internal Medicine
